# Patient Record
Sex: Male | Born: 1940 | Race: Black or African American | Hispanic: No | Marital: Married | Smoking: Never smoker
Health system: Southern US, Community
[De-identification: ages and names within clinical notes are randomized; demographics above are authoritative.]

## PROBLEM LIST (undated history)

## (undated) DIAGNOSIS — I480 Paroxysmal atrial fibrillation: Secondary | ICD-10-CM

## (undated) DIAGNOSIS — I10 Essential (primary) hypertension: Secondary | ICD-10-CM

## (undated) DIAGNOSIS — I5022 Chronic systolic (congestive) heart failure: Secondary | ICD-10-CM

## (undated) DIAGNOSIS — N183 Chronic kidney disease, stage 3 unspecified: Secondary | ICD-10-CM

## (undated) DIAGNOSIS — E785 Hyperlipidemia, unspecified: Secondary | ICD-10-CM

## (undated) DIAGNOSIS — I472 Ventricular tachycardia, unspecified: Secondary | ICD-10-CM

## (undated) DIAGNOSIS — I639 Cerebral infarction, unspecified: Secondary | ICD-10-CM

## (undated) DIAGNOSIS — N400 Enlarged prostate without lower urinary tract symptoms: Secondary | ICD-10-CM

---

## 2014-08-07 ENCOUNTER — Ambulatory Visit
Admit: 2014-08-07 | Discharge: 2014-08-07 | Payer: MEDICARE | Attending: Thoracic Surgery (Cardiothoracic Vascular Surgery)

## 2014-08-07 DIAGNOSIS — I429 Cardiomyopathy, unspecified: Secondary | ICD-10-CM

## 2014-08-07 MED ORDER — CARVEDILOL 6.25 MG TAB
6.25 mg | ORAL_TABLET | Freq: Two times a day (BID) | ORAL | Status: DC
Start: 2014-08-07 — End: 2014-09-15

## 2014-08-07 NOTE — Progress Notes (Signed)
[facility]  Office Consult      NAME:  Keith Sanford   DOB:   1940/10/27   MRN:  [id number]   PCP:  No primary care provider on file.    Date:  August 07, 2014    IMPRESSION:   1. 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   2. History of SVT, Afib; on chronic anticoagulation with Eliquis       PLAN:   1. [facility] next 4-6 weeks   2. Increase carvedilol to 12.5mg  bid  3. Continue evaluation for LVAD; we discussed risks/benefits and patient/caregiver responsibilities with [name] and his wife. They agree to proceed with workup.          Subjective:     Seen with Dr Myrtis SerKatz.     73yo M with history of chronic systolic heart failure (Class II-III) secondary to non-ischemic cardiomyopathy with most recent EF 20%.     His heart failure symptoms -progressive dyspnea on exertion- began in 2007; he was hospitalized at that time and had an AICD placed.     [facility] and treated by Dr Herold HarmsIskandar for heart failure exacerbation/volume overload. This was his first hospitalization in over 7 years for heart failure.     Cardiac ROS:   Patient denies any current exertional chest pain, dyspnea at rest, palpitations, syncope, orthopnea   +dyspnea when walking up slight incline/stairs  +PSND "a few times per week"  +BLE edema    General Review of Systems: No nausea, indigestion, vomiting, pain, cough, sputum. No bleeding. No neurological symptoms. Appetite normal. All other systems are negative except as noted in the HPI.    Objective:     Visit Vitals   Item Reading   ??? BP 118/78 mmHg   ??? Pulse 81   ??? Resp 16   ??? Ht 5\' 7"  (1.702 m)   ??? Wt 130 lb (58.968 kg)   ??? BMI 20.36 kg/m2   ??? SpO2 99%      Six Minute Walk: 2554m    General:  alert, cooperative, no distress    Neck:  supple, no significant adenopathy, carotids upstroke normal  bilaterally, no bruits    Heart:  RRR, no M/R/G    CVP:  <10 cm  ( -) HJR    Lungs: clear to auscultation bilaterally    Abdomen: soft, non-tender. Bowel sounds normal. No masses, no organomegaly.    Extremity: Trace to mild edema BLE to mid-shin    Neuro: Grossly normal        Past History:     Past Medical History   Diagnosis Date   ??? BPH (benign prostatic hyperplasia)    ??? TIA (transient ischemic attack)    ??? Cardiomyopathy (HCC)      NICM   ??? Hyperlipidemia    ??? Atrial fibrillation (HCC)    ??? SVT (supraventricular tachycardia) (HCC)    ??? Bladder neck obstruction        Past Surgical History   Procedure Laterality Date   ??? Hx implantable cardioverter defibrillator         Allergies:   No Known Allergies       Medication(s):     Current Outpatient Prescriptions   Medication Sig   ??? amiodarone (CORDARONE) 200 mg tablet Take 200 mg by mouth daily.   ??? apixaban (ELIQUIS)  5 mg tablet Take 5 mg by mouth two (2) times a day.   ??? atorvastatin (LIPITOR) 40 mg tablet Take  by mouth daily.   ??? carvedilol (COREG) 6.25 mg tablet Take  by mouth two (2) times daily (with meals).   ??? digoxin (LANOXIN) 0.125 mg tablet Take  by mouth daily.   ??? dutasteride (AVODART) 0.5 mg capsule Take 0.5 mg by mouth daily.   ??? lisinopril-hydrochlorothiazide (PRINZIDE, ZESTORETIC) 20-12.5 mg per tablet Take  by mouth daily.   ??? tamsulosin (FLOMAX) 0.4 mg capsule Take 0.4 mg by mouth daily.   ??? spironolactone (ALDACTONE) 25 mg tablet Take  by mouth daily.     No current facility-administered medications for this visit.     Additional comments: None      Data Review:     Medications reviewed  Current Outpatient Prescriptions   Medication Sig   ??? amiodarone (CORDARONE) 200 mg tablet Take 200 mg by mouth daily.   ??? apixaban (ELIQUIS) 5 mg tablet Take 5 mg by mouth two (2) times a day.   ??? atorvastatin (LIPITOR) 40 mg tablet Take  by mouth daily.   ??? carvedilol (COREG) 6.25 mg tablet Take  by mouth two (2) times daily (with meals).    ??? digoxin (LANOXIN) 0.125 mg tablet Take  by mouth daily.   ??? dutasteride (AVODART) 0.5 mg capsule Take 0.5 mg by mouth daily.   ??? lisinopril-hydrochlorothiazide (PRINZIDE, ZESTORETIC) 20-12.5 mg per tablet Take  by mouth daily.   ??? tamsulosin (FLOMAX) 0.4 mg capsule Take 0.4 mg by mouth daily.   ??? spironolactone (ALDACTONE) 25 mg tablet Take  by mouth daily.     No current facility-administered medications for this visit.       Thank you for letting us see him with you,    Misty StanleyLisa L. Eliberto IvoryAustin, ACNP   Lead VAD Coordinator   Office: [phone]  24/7 VAD pager: [phone]

## 2014-08-07 NOTE — Progress Notes (Signed)
Pt seen and examined, see not by Keith CedarLisa Sanford  Symptomatic idiopathic cardiomyopathy, with recent admission for decompensated heart failure  EF 20%, no CAD  Generally improved following recent admit but still has some PND  Had preliminary discussion re potential need for LVAD  Needs Cardiopulmonary stress test  Will increase carvedilol

## 2014-08-07 NOTE — Patient Instructions (Signed)
Please increase coreg (carvedilol to 12.5mg  twice per day). Notify us immediately if you have any dizziness or become light-headed.     We will contact you to schedule a cardiopulmonary stress test.     Please continue to follow up with Dr Herold HarmsIskandar.

## 2014-08-12 NOTE — Communication Body (Signed)
[facility]  Office Consult      NAME:  Keith Sanford   DOB:   June 17, 1941   MRN:  [id number]   PCP:  No primary care provider on file.    Date:  August 07, 2014    IMPRESSION:   1. 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   2. History of SVT, Afib; on chronic anticoagulation with Eliquis       PLAN:   1. [facility] next 4-6 weeks   2. Increase carvedilol to 12.5mg  bid  3. Continue evaluation for LVAD; we discussed risks/benefits and patient/caregiver responsibilities with Keith Sanford and his wife. They agree to proceed with workup.          Subjective:     Seen with Dr Myrtis SerKatz.     73yo M with history of chronic systolic heart failure (Class II-III) secondary to non-ischemic cardiomyopathy with most recent EF 20%.     His heart failure symptoms -progressive dyspnea on exertion- began in 2007; he was hospitalized at that time and had an AICD placed.     [facility] and treated by Dr Herold HarmsIskandar for heart failure exacerbation/volume overload. This was his first hospitalization in over 7 years for heart failure.     Cardiac ROS:   Patient denies any current exertional chest pain, dyspnea at rest, palpitations, syncope, orthopnea   +dyspnea when walking up slight incline/stairs  +PSND "a few times per week"  +BLE edema    General Review of Systems: No nausea, indigestion, vomiting, pain, cough, sputum. No bleeding. No neurological symptoms. Appetite normal. All other systems are negative except as noted in the HPI.    Objective:     Visit Vitals   Item Reading   ??? BP 118/78 mmHg   ??? Pulse 81   ??? Resp 16   ??? Ht 5\' 7"  (1.702 m)   ??? Wt 130 lb (58.968 kg)   ??? BMI 20.36 kg/m2   ??? SpO2 99%      Six Minute Walk: 24667m    General:  alert, cooperative, no distress    Neck:  supple, no significant adenopathy, carotids upstroke normal  bilaterally, no bruits    Heart:  RRR, no M/R/G    CVP:  <10 cm  ( -) HJR    Lungs: clear to auscultation bilaterally    Abdomen: soft, non-tender. Bowel sounds normal. No masses, no organomegaly.    Extremity: Trace to mild edema BLE to mid-shin    Neuro: Grossly normal        Past History:     Past Medical History   Diagnosis Date   ??? BPH (benign prostatic hyperplasia)    ??? TIA (transient ischemic attack)    ??? Cardiomyopathy (HCC)      NICM   ??? Hyperlipidemia    ??? Atrial fibrillation (HCC)    ??? SVT (supraventricular tachycardia) (HCC)    ??? Bladder neck obstruction        Past Surgical History   Procedure Laterality Date   ??? Hx implantable cardioverter defibrillator         Allergies:   No Known Allergies       Medication(s):     Current Outpatient Prescriptions   Medication Sig   ??? amiodarone (CORDARONE) 200 mg tablet Take 200 mg by mouth daily.   ??? apixaban (ELIQUIS)  5 mg tablet Take 5 mg by mouth two (2) times a day.   ??? atorvastatin (LIPITOR) 40 mg tablet Take  by mouth daily.   ??? carvedilol (COREG) 6.25 mg tablet Take  by mouth two (2) times daily (with meals).   ??? digoxin (LANOXIN) 0.125 mg tablet Take  by mouth daily.   ??? dutasteride (AVODART) 0.5 mg capsule Take 0.5 mg by mouth daily.   ??? lisinopril-hydrochlorothiazide (PRINZIDE, ZESTORETIC) 20-12.5 mg per tablet Take  by mouth daily.   ??? tamsulosin (FLOMAX) 0.4 mg capsule Take 0.4 mg by mouth daily.   ??? spironolactone (ALDACTONE) 25 mg tablet Take  by mouth daily.     No current facility-administered medications for this visit.     Additional comments: None      Data Review:     Medications reviewed  Current Outpatient Prescriptions   Medication Sig   ??? amiodarone (CORDARONE) 200 mg tablet Take 200 mg by mouth daily.   ??? apixaban (ELIQUIS) 5 mg tablet Take 5 mg by mouth two (2) times a day.   ??? atorvastatin (LIPITOR) 40 mg tablet Take  by mouth daily.   ??? carvedilol (COREG) 6.25 mg tablet Take  by mouth two (2) times daily (with meals).    ??? digoxin (LANOXIN) 0.125 mg tablet Take  by mouth daily.   ??? dutasteride (AVODART) 0.5 mg capsule Take 0.5 mg by mouth daily.   ??? lisinopril-hydrochlorothiazide (PRINZIDE, ZESTORETIC) 20-12.5 mg per tablet Take  by mouth daily.   ??? tamsulosin (FLOMAX) 0.4 mg capsule Take 0.4 mg by mouth daily.   ??? spironolactone (ALDACTONE) 25 mg tablet Take  by mouth daily.     No current facility-administered medications for this visit.       Thank you for letting us see him with you,    Misty StanleyLisa L. Eliberto IvoryAustin, ACNP   Lead VAD Coordinator   Office: [phone]  24/7 VAD pager: [phone]          Pt seen and examined, see not by Briant CedarLisa Melvenia Favela  Symptomatic idiopathic cardiomyopathy, with recent admission for decompensated heart failure  EF 20%, no CAD  Generally improved following recent admit but still has some PND  Had preliminary discussion re potential need for LVAD  Needs Cardiopulmonary stress test  Will increase carvedilol

## 2014-09-15 MED ORDER — CARVEDILOL 12.5 MG TAB
12.5 mg | ORAL_TABLET | Freq: Two times a day (BID) | ORAL | Status: AC
Start: 2014-09-15 — End: ?

## 2015-09-16 ENCOUNTER — Inpatient Hospital Stay (HOSPITAL_COMMUNITY)
Admission: AD | Admit: 2015-09-16 | Discharge: 2015-10-28 | DRG: 001 | Disposition: E | Payer: Medicare Other | Source: Other Acute Inpatient Hospital | Attending: Cardiothoracic Surgery | Admitting: Cardiothoracic Surgery

## 2015-09-16 ENCOUNTER — Encounter (HOSPITAL_COMMUNITY): Payer: Self-pay | Admitting: Internal Medicine

## 2015-09-16 DIAGNOSIS — D72829 Elevated white blood cell count, unspecified: Secondary | ICD-10-CM | POA: Diagnosis not present

## 2015-09-16 DIAGNOSIS — E869 Volume depletion, unspecified: Secondary | ICD-10-CM | POA: Diagnosis present

## 2015-09-16 DIAGNOSIS — Z9689 Presence of other specified functional implants: Secondary | ICD-10-CM

## 2015-09-16 DIAGNOSIS — Z515 Encounter for palliative care: Secondary | ICD-10-CM | POA: Diagnosis not present

## 2015-09-16 DIAGNOSIS — N2581 Secondary hyperparathyroidism of renal origin: Secondary | ICD-10-CM | POA: Diagnosis present

## 2015-09-16 DIAGNOSIS — Z8673 Personal history of transient ischemic attack (TIA), and cerebral infarction without residual deficits: Secondary | ICD-10-CM

## 2015-09-16 DIAGNOSIS — E162 Hypoglycemia, unspecified: Secondary | ICD-10-CM | POA: Diagnosis not present

## 2015-09-16 DIAGNOSIS — I480 Paroxysmal atrial fibrillation: Secondary | ICD-10-CM | POA: Diagnosis present

## 2015-09-16 DIAGNOSIS — I5043 Acute on chronic combined systolic (congestive) and diastolic (congestive) heart failure: Secondary | ICD-10-CM | POA: Diagnosis present

## 2015-09-16 DIAGNOSIS — E43 Unspecified severe protein-calorie malnutrition: Secondary | ICD-10-CM | POA: Diagnosis present

## 2015-09-16 DIAGNOSIS — J156 Pneumonia due to other aerobic Gram-negative bacteria: Secondary | ICD-10-CM | POA: Diagnosis not present

## 2015-09-16 DIAGNOSIS — Z6820 Body mass index (BMI) 20.0-20.9, adult: Secondary | ICD-10-CM | POA: Diagnosis not present

## 2015-09-16 DIAGNOSIS — R338 Other retention of urine: Secondary | ICD-10-CM | POA: Diagnosis present

## 2015-09-16 DIAGNOSIS — I2721 Secondary pulmonary arterial hypertension: Secondary | ICD-10-CM | POA: Diagnosis present

## 2015-09-16 DIAGNOSIS — N359 Urethral stricture, unspecified: Secondary | ICD-10-CM | POA: Diagnosis present

## 2015-09-16 DIAGNOSIS — I309 Acute pericarditis, unspecified: Secondary | ICD-10-CM | POA: Insufficient documentation

## 2015-09-16 DIAGNOSIS — R5082 Postprocedural fever: Secondary | ICD-10-CM | POA: Diagnosis not present

## 2015-09-16 DIAGNOSIS — N320 Bladder-neck obstruction: Secondary | ICD-10-CM | POA: Diagnosis present

## 2015-09-16 DIAGNOSIS — J189 Pneumonia, unspecified organism: Secondary | ICD-10-CM | POA: Diagnosis not present

## 2015-09-16 DIAGNOSIS — Z4509 Encounter for adjustment and management of other cardiac device: Secondary | ICD-10-CM

## 2015-09-16 DIAGNOSIS — N179 Acute kidney failure, unspecified: Secondary | ICD-10-CM | POA: Diagnosis present

## 2015-09-16 DIAGNOSIS — I5021 Acute systolic (congestive) heart failure: Secondary | ICD-10-CM

## 2015-09-16 DIAGNOSIS — I425 Other restrictive cardiomyopathy: Secondary | ICD-10-CM | POA: Diagnosis not present

## 2015-09-16 DIAGNOSIS — D62 Acute posthemorrhagic anemia: Secondary | ICD-10-CM | POA: Diagnosis not present

## 2015-09-16 DIAGNOSIS — I509 Heart failure, unspecified: Secondary | ICD-10-CM | POA: Diagnosis not present

## 2015-09-16 DIAGNOSIS — Z8249 Family history of ischemic heart disease and other diseases of the circulatory system: Secondary | ICD-10-CM

## 2015-09-16 DIAGNOSIS — Z452 Encounter for adjustment and management of vascular access device: Secondary | ICD-10-CM

## 2015-09-16 DIAGNOSIS — N401 Enlarged prostate with lower urinary tract symptoms: Secondary | ICD-10-CM | POA: Diagnosis present

## 2015-09-16 DIAGNOSIS — R0600 Dyspnea, unspecified: Secondary | ICD-10-CM | POA: Diagnosis not present

## 2015-09-16 DIAGNOSIS — Z95810 Presence of automatic (implantable) cardiac defibrillator: Secondary | ICD-10-CM | POA: Diagnosis present

## 2015-09-16 DIAGNOSIS — Z9911 Dependence on respirator [ventilator] status: Secondary | ICD-10-CM | POA: Diagnosis not present

## 2015-09-16 DIAGNOSIS — Z7901 Long term (current) use of anticoagulants: Secondary | ICD-10-CM

## 2015-09-16 DIAGNOSIS — N289 Disorder of kidney and ureter, unspecified: Secondary | ICD-10-CM | POA: Diagnosis not present

## 2015-09-16 DIAGNOSIS — I313 Pericardial effusion (noninflammatory): Secondary | ICD-10-CM | POA: Diagnosis not present

## 2015-09-16 DIAGNOSIS — A419 Sepsis, unspecified organism: Secondary | ICD-10-CM | POA: Diagnosis not present

## 2015-09-16 DIAGNOSIS — I5023 Acute on chronic systolic (congestive) heart failure: Secondary | ICD-10-CM | POA: Diagnosis present

## 2015-09-16 DIAGNOSIS — N390 Urinary tract infection, site not specified: Secondary | ICD-10-CM | POA: Diagnosis present

## 2015-09-16 DIAGNOSIS — J9811 Atelectasis: Secondary | ICD-10-CM | POA: Diagnosis not present

## 2015-09-16 DIAGNOSIS — I1310 Hypertensive heart and chronic kidney disease without heart failure, with stage 1 through stage 4 chronic kidney disease, or unspecified chronic kidney disease: Secondary | ICD-10-CM | POA: Diagnosis present

## 2015-09-16 DIAGNOSIS — N184 Chronic kidney disease, stage 4 (severe): Secondary | ICD-10-CM | POA: Diagnosis present

## 2015-09-16 DIAGNOSIS — B377 Candidal sepsis: Secondary | ICD-10-CM | POA: Diagnosis not present

## 2015-09-16 DIAGNOSIS — Y95 Nosocomial condition: Secondary | ICD-10-CM | POA: Diagnosis not present

## 2015-09-16 DIAGNOSIS — N183 Chronic kidney disease, stage 3 unspecified: Secondary | ICD-10-CM | POA: Diagnosis present

## 2015-09-16 DIAGNOSIS — E785 Hyperlipidemia, unspecified: Secondary | ICD-10-CM | POA: Diagnosis present

## 2015-09-16 DIAGNOSIS — E874 Mixed disorder of acid-base balance: Secondary | ICD-10-CM | POA: Diagnosis not present

## 2015-09-16 DIAGNOSIS — R570 Cardiogenic shock: Secondary | ICD-10-CM | POA: Diagnosis present

## 2015-09-16 DIAGNOSIS — E870 Hyperosmolality and hypernatremia: Secondary | ICD-10-CM | POA: Diagnosis present

## 2015-09-16 DIAGNOSIS — J9600 Acute respiratory failure, unspecified whether with hypoxia or hypercapnia: Secondary | ICD-10-CM | POA: Diagnosis not present

## 2015-09-16 DIAGNOSIS — J9690 Respiratory failure, unspecified, unspecified whether with hypoxia or hypercapnia: Secondary | ICD-10-CM

## 2015-09-16 DIAGNOSIS — I4891 Unspecified atrial fibrillation: Secondary | ICD-10-CM | POA: Diagnosis not present

## 2015-09-16 DIAGNOSIS — J158 Pneumonia due to other specified bacteria: Secondary | ICD-10-CM | POA: Diagnosis not present

## 2015-09-16 DIAGNOSIS — R0603 Acute respiratory distress: Secondary | ICD-10-CM

## 2015-09-16 DIAGNOSIS — J984 Other disorders of lung: Secondary | ICD-10-CM | POA: Diagnosis not present

## 2015-09-16 DIAGNOSIS — I493 Ventricular premature depolarization: Secondary | ICD-10-CM | POA: Diagnosis not present

## 2015-09-16 DIAGNOSIS — B49 Unspecified mycosis: Secondary | ICD-10-CM | POA: Diagnosis not present

## 2015-09-16 DIAGNOSIS — K567 Ileus, unspecified: Secondary | ICD-10-CM

## 2015-09-16 DIAGNOSIS — I428 Other cardiomyopathies: Secondary | ICD-10-CM | POA: Diagnosis present

## 2015-09-16 DIAGNOSIS — Z95811 Presence of heart assist device: Secondary | ICD-10-CM | POA: Diagnosis not present

## 2015-09-16 DIAGNOSIS — N189 Chronic kidney disease, unspecified: Secondary | ICD-10-CM

## 2015-09-16 DIAGNOSIS — T17990A Other foreign object in respiratory tract, part unspecified in causing asphyxiation, initial encounter: Secondary | ICD-10-CM | POA: Diagnosis not present

## 2015-09-16 DIAGNOSIS — J151 Pneumonia due to Pseudomonas: Secondary | ICD-10-CM | POA: Diagnosis not present

## 2015-09-16 DIAGNOSIS — I472 Ventricular tachycardia: Secondary | ICD-10-CM | POA: Diagnosis present

## 2015-09-16 DIAGNOSIS — R0602 Shortness of breath: Secondary | ICD-10-CM

## 2015-09-16 DIAGNOSIS — Z960 Presence of urogenital implants: Secondary | ICD-10-CM | POA: Diagnosis not present

## 2015-09-16 DIAGNOSIS — Z4659 Encounter for fitting and adjustment of other gastrointestinal appliance and device: Secondary | ICD-10-CM

## 2015-09-16 DIAGNOSIS — R6521 Severe sepsis with septic shock: Secondary | ICD-10-CM | POA: Diagnosis not present

## 2015-09-16 DIAGNOSIS — J90 Pleural effusion, not elsewhere classified: Secondary | ICD-10-CM | POA: Diagnosis not present

## 2015-09-16 DIAGNOSIS — Z66 Do not resuscitate: Secondary | ICD-10-CM | POA: Diagnosis not present

## 2015-09-16 DIAGNOSIS — I469 Cardiac arrest, cause unspecified: Secondary | ICD-10-CM | POA: Diagnosis not present

## 2015-09-16 DIAGNOSIS — R5381 Other malaise: Secondary | ICD-10-CM | POA: Diagnosis not present

## 2015-09-16 DIAGNOSIS — Z823 Family history of stroke: Secondary | ICD-10-CM

## 2015-09-16 DIAGNOSIS — N170 Acute kidney failure with tubular necrosis: Secondary | ICD-10-CM | POA: Diagnosis not present

## 2015-09-16 DIAGNOSIS — I959 Hypotension, unspecified: Secondary | ICD-10-CM | POA: Diagnosis present

## 2015-09-16 DIAGNOSIS — Z01810 Encounter for preprocedural cardiovascular examination: Secondary | ICD-10-CM | POA: Diagnosis not present

## 2015-09-16 DIAGNOSIS — N178 Other acute kidney failure: Secondary | ICD-10-CM | POA: Diagnosis not present

## 2015-09-16 DIAGNOSIS — I272 Other secondary pulmonary hypertension: Secondary | ICD-10-CM | POA: Diagnosis present

## 2015-09-16 DIAGNOSIS — R319 Hematuria, unspecified: Secondary | ICD-10-CM | POA: Diagnosis present

## 2015-09-16 DIAGNOSIS — R509 Fever, unspecified: Secondary | ICD-10-CM | POA: Diagnosis not present

## 2015-09-16 DIAGNOSIS — I9589 Other hypotension: Secondary | ICD-10-CM | POA: Diagnosis not present

## 2015-09-16 DIAGNOSIS — L8990 Pressure ulcer of unspecified site, unspecified stage: Secondary | ICD-10-CM | POA: Insufficient documentation

## 2015-09-16 HISTORY — DX: Paroxysmal atrial fibrillation: I48.0

## 2015-09-16 HISTORY — DX: Benign prostatic hyperplasia without lower urinary tract symptoms: N40.0

## 2015-09-16 HISTORY — DX: Essential (primary) hypertension: I10

## 2015-09-16 HISTORY — DX: Hyperlipidemia, unspecified: E78.5

## 2015-09-16 HISTORY — DX: Cerebral infarction, unspecified: I63.9

## 2015-09-16 HISTORY — DX: Ventricular tachycardia, unspecified: I47.20

## 2015-09-16 HISTORY — DX: Chronic systolic (congestive) heart failure: I50.22

## 2015-09-16 HISTORY — DX: Chronic kidney disease, stage 3 (moderate): N18.3

## 2015-09-16 HISTORY — DX: Chronic kidney disease, stage 3 unspecified: N18.30

## 2015-09-16 HISTORY — DX: Ventricular tachycardia: I47.2

## 2015-09-16 LAB — CARBOXYHEMOGLOBIN
Carboxyhemoglobin: 1.1 % (ref 0.5–1.5)
Carboxyhemoglobin: 1.2 % (ref 0.5–1.5)
METHEMOGLOBIN: 0.9 % (ref 0.0–1.5)
METHEMOGLOBIN: 1 % (ref 0.0–1.5)
O2 Saturation: 36.3 %
O2 Saturation: 53 %
Total hemoglobin: 14.8 g/dL (ref 13.5–18.0)
Total hemoglobin: 15.2 g/dL (ref 13.5–18.0)

## 2015-09-16 LAB — COMPREHENSIVE METABOLIC PANEL
ALT: 31 U/L (ref 17–63)
AST: 36 U/L (ref 15–41)
Albumin: 2.5 g/dL — ABNORMAL LOW (ref 3.5–5.0)
Alkaline Phosphatase: 117 U/L (ref 38–126)
Anion gap: 11 (ref 5–15)
BUN: 31 mg/dL — AB (ref 6–20)
CHLORIDE: 114 mmol/L — AB (ref 101–111)
CO2: 24 mmol/L (ref 22–32)
CREATININE: 2.04 mg/dL — AB (ref 0.61–1.24)
Calcium: 8.6 mg/dL — ABNORMAL LOW (ref 8.9–10.3)
GFR calc Af Amer: 35 mL/min — ABNORMAL LOW (ref >60)
GFR, EST NON AFRICAN AMERICAN: 30 mL/min — AB (ref >60)
Glucose, Bld: 181 mg/dL — ABNORMAL HIGH (ref 65–99)
Potassium: 4.3 mmol/L (ref 3.5–5.1)
Sodium: 149 mmol/L — ABNORMAL HIGH (ref 135–145)
Total Bilirubin: 1.5 mg/dL — ABNORMAL HIGH (ref 0.3–1.2)
Total Protein: 5.7 g/dL — ABNORMAL LOW (ref 6.5–8.1)

## 2015-09-16 LAB — CBC
HEMATOCRIT: 43.4 % (ref 39.0–52.0)
Hemoglobin: 14.6 g/dL (ref 13.0–17.0)
MCH: 30.5 pg (ref 26.0–34.0)
MCHC: 33.6 g/dL (ref 30.0–36.0)
MCV: 90.8 fL (ref 78.0–100.0)
PLATELETS: 502 10*3/uL — AB (ref 150–400)
RBC: 4.78 MIL/uL (ref 4.22–5.81)
RDW: 14.3 % (ref 11.5–15.5)
WBC: 8 10*3/uL (ref 4.0–10.5)

## 2015-09-16 LAB — MRSA PCR SCREENING: MRSA BY PCR: NEGATIVE

## 2015-09-16 LAB — BRAIN NATRIURETIC PEPTIDE: B Natriuretic Peptide: >4500 pg/mL — ABNORMAL HIGH (ref 0.0–100.0)

## 2015-09-16 LAB — URIC ACID: URIC ACID, SERUM: 8.4 mg/dL — AB (ref 4.4–7.6)

## 2015-09-16 LAB — ABO/RH: ABO/RH(D): B POS

## 2015-09-16 MED ORDER — SILDENAFIL CITRATE 20 MG PO TABS
20.0000 mg | ORAL_TABLET | Freq: Three times a day (TID) | ORAL | Status: DC
  Administered 2015-09-16 – 2015-09-17 (×2): 20 mg via ORAL
  Filled 2015-09-16 (×2): qty 1

## 2015-09-16 MED ORDER — SODIUM CHLORIDE 0.9 % IV SOLN
250.0000 mL | INTRAVENOUS | Status: DC | PRN
  Administered 2015-09-16: 4 mL via INTRAVENOUS
  Administered 2015-09-23 (×2): via INTRAVENOUS

## 2015-09-16 MED ORDER — SODIUM CHLORIDE 0.9 % IJ SOLN
10.0000 mL | Freq: Two times a day (BID) | INTRAMUSCULAR | Status: DC
  Administered 2015-09-17 – 2015-09-19 (×6): 10 mL
  Administered 2015-09-20: 30 mL
  Administered 2015-09-20 – 2015-09-22 (×4): 10 mL

## 2015-09-16 MED ORDER — ONDANSETRON HCL 4 MG/2ML IJ SOLN: 4.0000 mg | Freq: Four times a day (QID) | INTRAMUSCULAR | Status: DC | PRN

## 2015-09-16 MED ORDER — ATORVASTATIN CALCIUM 40 MG PO TABS
40.0000 mg | ORAL_TABLET | Freq: Every day | ORAL | Status: DC
  Administered 2015-09-16 – 2015-09-22 (×7): 40 mg via ORAL
  Filled 2015-09-16 (×7): qty 1

## 2015-09-16 MED ORDER — SODIUM CHLORIDE 0.9 % IJ SOLN: 10.0000 mL | INTRAMUSCULAR | Status: DC | PRN

## 2015-09-16 MED ORDER — SODIUM CHLORIDE 0.9 % IJ SOLN: 3.0000 mL | INTRAMUSCULAR | Status: DC | PRN

## 2015-09-16 MED ORDER — ACETAMINOPHEN 325 MG PO TABS: 650.0000 mg | ORAL_TABLET | ORAL | Status: DC | PRN

## 2015-09-16 MED ORDER — HEPARIN (PORCINE) IN NACL 100-0.45 UNIT/ML-% IJ SOLN
800.0000 [IU]/h | INTRAMUSCULAR | Status: DC
  Administered 2015-09-16: 800 [IU]/h via INTRAVENOUS
  Filled 2015-09-16: qty 250

## 2015-09-16 MED ORDER — SODIUM CHLORIDE 0.9 % IJ SOLN
3.0000 mL | Freq: Two times a day (BID) | INTRAMUSCULAR | Status: DC
  Administered 2015-09-16 – 2015-09-21 (×7): 3 mL via INTRAVENOUS

## 2015-09-16 MED ORDER — MILRINONE IN DEXTROSE 20 MG/100ML IV SOLN
0.2500 ug/kg/min | INTRAVENOUS | Status: DC
  Administered 2015-09-16: 0.375 ug/kg/min via INTRAVENOUS
  Administered 2015-09-17: 0.25 ug/kg/min via INTRAVENOUS
  Administered 2015-09-17: 0.375 ug/kg/min via INTRAVENOUS
  Administered 2015-09-19 – 2015-09-23 (×4): 0.25 ug/kg/min via INTRAVENOUS
  Filled 2015-09-16 (×10): qty 100

## 2015-09-16 MED ORDER — AMIODARONE HCL 200 MG PO TABS
200.0000 mg | ORAL_TABLET | Freq: Every day | ORAL | Status: DC
  Administered 2015-09-16 – 2015-09-22 (×7): 200 mg via ORAL
  Filled 2015-09-16 (×6): qty 1

## 2015-09-16 MED ORDER — LIDOCAINE VISCOUS 2 % MT SOLN
15.0000 mL | Freq: Once | OROMUCOSAL | Status: DC
  Filled 2015-09-16: qty 15

## 2015-09-16 MED ORDER — DUTASTERIDE 0.5 MG PO CAPS
0.5000 mg | ORAL_CAPSULE | Freq: Every day | ORAL | Status: DC
  Administered 2015-09-16: 0.5 mg via ORAL
  Filled 2015-09-16 (×2): qty 1

## 2015-09-16 MED ORDER — FUROSEMIDE 10 MG/ML IJ SOLN
40.0000 mg | Freq: Two times a day (BID) | INTRAMUSCULAR | Status: DC
  Administered 2015-09-16: 40 mg via INTRAVENOUS
  Filled 2015-09-16 (×2): qty 4

## 2015-09-16 NOTE — Progress Notes (Addendum)
LVAD Coordinator Note:  Initial Encounter with LVAD Team and MCS Introduction:  [name] 74 y/o male with h/o PAF, previous CVA, HTN, HL, V. 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 Jude CTR-D (initial ICD 2007 with upgrade to CRT in 2015) referred by Dr. Bernarda Caffey for further management of cardiogenic shock and consideration of advanced therapies; was transferred here from [facility] with worsening heart failure symptoms.   Clinical course with heart failure: Has had worsening HF symptoms and are currently Class IV with pronounced fatigue and activity intolerance. Currently on a Milrinone drip (started 09/24/2015).  VAD educational packet including "HM II Patient Handbook", "HM II Left Ventricular Assist System" packet, and "[place] HM II Patient Education" reviewed in detail with me and left at bedside for continued reference. Patient education DVD was given to him as well for reference should he want to see more about the equipment at home after reviewing the information I left her.   Explained that LVAD can be implanted for two indications in the setting of advanced left ventricular heart failure treatment:  1. Bridge to transplant - used for patients who cannot safely wait for heart transplant without this device.  Or   2. Destination therapy - used for patients until end of life or recovery of heart function.  Discussed that at this point the patient would be considered for Destination therapy should the patient be deemed an acceptable VAD candidate.   Provided brief equipment overview of the HeartMate II pump and discussed placement, surgical procedure, peripheral equipment, life-long coumadin therapy, importance of medication adherence and clinic follow up for as long as patient is living on support, life-style modifications, as well as need for caregiver to be successful with this therapy.   The patient was familiar to the equipment and concept of  the LVAD. We discussed the process of the evaluation period and how a decision was made by the [facility] team whether she would be an appropriate candidate for therapy or not. Evaluation consent was reviewed and given for reference while the patient makes his/her decision to proceed with candidacy evaluation.   Caregiver Support: Has a wife of >50 years that will be his caregiver  Home Inspection Checklist: not reviewed currently   Advised the patient review the materials, contact either myself or Hessie Diener with questions and we will plan on meeting with her at next scheduled clinic appointment. Verbalized he/she would review the evaluation consent and make a decision regarding the evaluation process.   At this point after briefly reviewing her chart some points that would exclude the patient or make them extremely high risk to have LVAD surgery include:   Unable to determine at this time; however concerned regarding distance (3 hour drive); also concerned about why he was unable to maintain his CPX appointments in the past with our team as well as FU OV with the [facility] VAD team   **I spoke with Denny Peon (VAD Coordinator) at [facility] [phone] and she told me that their team has not seen the patient yet for evaluation due to schedule issues. Dr. Gala Romney stated he did see the surgeon Myrtis Ser) up in the area. Will try to get the notes from that visit.    VAD Coordinators will FU with them tomorrow AM when his wife returns. He did not want to agree to evaluation process without speaking to her first.   Session Time: 15 min  Rexene Alberts, RN  VAD Coordinator   Office: [phone] 24/7 VAD Pager: [phone]

## 2015-09-16 NOTE — H&P (Signed)
Advanced Heart Failure Team History and Physical Note  Primary Cardiologist:  Harriette Bouillon, MD   Reason for Admission: Cardiogenic shock   HPI:    74 y/o male with h/o PAF, previous CVA, HTN, HL, V. 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 Jude CTR-D (initial ICD 2007 with upgrade to CRT in 2015) referred by Dr. Bernarda Caffey for further management of cardiogenic shock and consideration of advanced therapies.   Developed HF in 2007 cath showed normal coronaries by report. Doing well until about a year ago and was referred to [facility] for possible advanced therapies. Repeat LHC at that time showed ok coronaries. Felt to be too early for advanced therapies but CPX ordered. Unfortunately CPX never performed due to scheduling difficulties.  Over past 2 months has developed Class IV HF symptoms. With severe fatigue. Unable to do any activities. Admitted in [place]. Echo reviewed personally LVEF 5% with global HK. RV mild to moderate dysfunction. Underwent RHC today   RA 11 RV 77/9 PA 74/25 (43) PCWP 23 Fick CO/CI 1.68/1.05 PA sat 43%  Started on dobutamine and transferred here. Feels weak. No orthopnea or PND. No CP. No recent ICD shocks. 1 year ago was completely functional.   Review of Systems: [y] = yes, [ ]  = no   General: Weight gain [ ] ; Weight loss [ ] ; Anorexia [ ] ; Fatigue Cove.Etienne ]; Fever [ ] ; Chills [ ] ; Weakness [ ]   Cardiac: Chest pain/pressure [ ] ; Resting SOB [ ] ; Exertional SOB [ y]; Orthopnea [ ] ; Pedal Edema [ ] ; Palpitations [ ] ; Syncope [ ] ; Presyncope [ ] ; Paroxysmal nocturnal dyspnea[ ]   Pulmonary: Cough [ ] ; Wheezing[ ] ; Hemoptysis[ ] ; Sputum [ ] ; Snoring [ ]   GI: Vomiting[ ] ; Dysphagia[ ] ; Melena[ ] ; Hematochezia [ ] ; Heartburn[ ] ; Abdominal pain [ ] ; Constipation [ ] ; Diarrhea [ ] ; BRBPR [ ]   GU: Hematuria[ ] ; Dysuria [ ] ; Nocturia[ ]   Vascular: Pain in legs with walking [ ] ; Pain in feet with lying flat [ ] ; Non-healing sores [ ] ; Stroke [  ]; TIA [ ] ; Slurred speech [ ] ;  Neuro: Headaches[ ] ; Vertigo[ ] ; Seizures[ ] ; Paresthesias[ ] ;Blurred vision [ ] ; Diplopia [ ] ; Vision changes [ ]   Ortho/Skin: Arthritis Cove.Etienne ]; Joint pain [ y]; Muscle pain [ ] ; Joint swelling [ ] ; Back Pain [ ] ; Rash [ ]   Psych: Depression[ ] ; Anxiety[ ]   Heme: Bleeding problems [ ] ; Clotting disorders [ ] ; Anemia [ ]   Endocrine: Diabetes [ ] ; Thyroid dysfunction[ ]   Home Medications 1. Amiodarone 100 daily 2. Atorvastatin 40mg  daily 3. Carvedilol 12.5 bid 4. Digoxin 0.125mg  daily 5. Eliquis 2.5 bid 6. Flomax 0.4 daily 7. Bumex 1mg  prn  Past Medical History: Past Medical History  Diagnosis Date  . 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 CKD (chronic kidney disease) stage 3, GFR 30-59 ml/min   . V-tach (HCC)   . PAF (paroxysmal atrial fibrillation) (HCC)   . CVA (cerebral infarction)   . BPH (benign prostatic hyperplasia)   . HTN (hypertension), benign   . Hyperlipidemia     Past Surgical History: No past surgical history on file.  Family History: Family History  Problem Relation Age of Onset  . Hypertension Mother   . CVA Maternal Aunt     Social History: Social History   Social History  . Marital Status: Married  Spouse Name: N/A  . Number of Children: N/A  . Years of Education: N/A   Occupational History  . [employer]     part time   Social History Main Topics  . Smoking status: Never Smoker   . Smokeless tobacco: Not on file  . Alcohol Use: No  . Drug Use: No  . Sexual Activity: Not on file   Other Topics Concern  . Not on file   Social History Narrative  . No narrative on file    Allergies:  Allergies no known allergies  Objective:    Vital Signs:   Weight:  [53.4 kg (117 lb 11.6 oz)] 53.4 kg (117 lb 11.6 oz) (12/21 1553)   Filed Weights   08/30/2015 1553  Weight: 53.4 kg (117 lb 11.6 oz)    Physical Exam: General:  Frail. Thin. Lying in bed.  No resp  difficulty HEENT: normal Neck: supple. JVP 9 . Carotids 2+ bilat; no bruits. No lymphadenopathy or thryomegaly appreciated. Cor: PMI laterally displaced. Regular rate & rhythm. +s3 Lungs: clear Abdomen: soft, nontender, nondistended. No hepatosplenomegaly. No bruits or masses. Good bowel sounds. Extremities: no cyanosis, clubbing, rash, edema Neuro: alert & orientedx3, cranial nerves grossly intact. moves all 4 extremities w/o difficulty. Affect pleasant  Telemetry: AV paced 80-90s  Labs: Basic Metabolic Panel: No results for input(s): NA, K, CL, CO2, GLUCOSE, BUN, CREATININE, CALCIUM, MG, PHOS in the last 168 hours.  Liver Function Tests: No results for input(s): AST, ALT, ALKPHOS, BILITOT, PROT, ALBUMIN in the last 168 hours. No results for input(s): LIPASE, AMYLASE in the last 168 hours. No results for input(s): AMMONIA in the last 168 hours.  CBC: No results for input(s): WBC, NEUTROABS, HGB, HCT, MCV, PLT in the last 168 hours.  Cardiac Enzymes: No results for input(s): CKTOTAL, CKMB, CKMBINDEX, TROPONINI in the last 168 hours.  BNP: BNP (last 3 results) No results for input(s): BNP in the last 8760 hours.  ProBNP (last 3 results) No results for input(s): PROBNP in the last 8760 hours.   CBG: No results for input(s): GLUCAP in the last 168 hours.  Coagulation Studies: No results for input(s): LABPROT, INR in the last 72 hours.  Other results: EKG: AV paced (LV pacing)  Imaging: No results found.      Assessment:   1. Cardiogenic shock 2. 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 RV mild to moderate HK    --s/p St. Jude CRT-D 3. PAF on Eiiquis     --This patients CHA2DS2-VASc Score and unadjusted Ischemic Stroke Rate (% per year) is equal to 7.2 % stroke rate/year from a score of 5 Above score calculated as 1 point each if present [CHF, HTN, DM, Vascular=MI/PAD/Aortic Plaque, Age if 65-74, or Male] Above score calculated as 2 points each if  present [Age > 75, or Stroke/TIA/TE] 4. 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 (previous baseline ~1.5) 6. HTN 7. Hyperlipidemia 8. Pulmonary HTN    Plan/Discussion:     He has end-stage HF now with cardiogenic shock. LVEF 5%. RV with mild to moderate dysfunction. 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 Will need inotrope support for now. Place PICC. Given severe PAH will change dobutamine to milrinone and add sildenafil. Diurese gently. Stop carvedilol. No ACE/ARB due to renal failure.  I suspect renal  function will improve with inotropes. Will begin VAD w/u. RV looks suitable.   Follow co-ox and CVP. Will need repeat RHC to reassess pulmonary pressures in 48 hours.   Given size may be Heartware candidate and not Heartmate II.   ICD interrogated personally. AV delay extended from 40ms to . Hopefully this will allow for more LV filling time. No VT or AF.   Hold Eliquis. Start heparin.   Length of Stay: 0   Arvilla Meres MD 09/01/2015, 4:50 PM  Advanced Heart Failure Team Pager [phone] (M-F; 7a - 4p)  Please contact [facility] for night-coverage after hours (4p -7a ) and weekends on [email or web address] A

## 2015-09-16 NOTE — Procedures (Signed)
Foley Catheter Placement Note  Indications:  1. Urinary retention   Pre-operative Diagnosis:  1. Urinary retention   Post-operative Diagnosis:  1. Urinary retention  2. Prostatic urethral stricture   Surgeon: Berniece Salines, MD   Resident: E. Emily Filbert, MD  Assistants: None  Procedure Details  Patient was placed in the supine position, prepped with Betadine and draped in the usual sterile fashion.  We injected lidocaine jelly per urethra prior to the procedure.    We then proceeded with cystourethroscopy. The patient was positioned supine on the table in the relaxed dorsal lithotomy  position and the external genitalia were prepped and draped in the standard fashion.  The cystoscope was inserted per urethra with copious lubrication.  The anterior urethra was normal.  At the level of the prostatic urethra we encountered evidence of multiple dense adhesions which had formed between the lateral lobes of the prostate following his TURP. I was able to anteriorly identify a very small pin point 3 [place] opening. We passed a sensor wire through the anterior opening under direct visualization without meeting any resistance until the wire curled in the bladder. We then removed the cystoscopy and obtained a 16 french council tip catheter and advanced the foley over the wire into the bladder with some force resulting in return of clear yellow urine. We inflated the balloon with 10 mL sterile water and removed the wire. The foley was attached to a drainage bag and secured with a StatLock.  Placement of the catheter had return of greater than 500 L of clear yellow urine.  Complications: None; patient tolerated the procedure well.  Plan:   1.  Continue Foley catheter to drainage for 7 days due to urethral dilation and greater than 500 L of urine returned with catheter placement and bladder stretch injury

## 2015-09-16 NOTE — Consult Note (Signed)
Urology Consult  Referring physician: Glori Bickers, MD Reason for referral: Urinary retention  Chief Complaint: Urinary retention  History of Present Illness:  74 yo M with a complex past medical history including end stage heart failure who was admitted tonight with cardiogenic shock. He developed urinary retention and multiple unsuccessful attempts were made at placement of foley catheter, including with a 16 coude catheter.   Of note, he has a history of a TURP in ~2005. Following his procedure he reports voiding well. He denies weak stream, straining or acute urinary retention. He has had foley catheters placed since his TURP but does note that it was not straightforward but did not require cystoscopy or wires as far as he knows.  Urology was consulted for urgent foley catheter placement.  Past Medical History  Diagnosis Date  . 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 CKD (chronic kidney disease) stage 3, GFR 30-59 ml/min   . V-tach ([place])   . PAF (paroxysmal atrial fibrillation) ([place])   . CVA (cerebral infarction)   . BPH (benign prostatic hyperplasia)   . HTN (hypertension), benign   . Hyperlipidemia    No past surgical history on file.  Medications: I have reviewed the patient's current medications. Allergies: No Known Allergies  Family History  Problem Relation Age of Onset  . Hypertension Mother   . CVA Maternal Aunt    Social History:  reports that he has never smoked. He does not have any smokeless tobacco history on file. He reports that he does not drink alcohol or use illicit drugs.  ROS 12 system review was performed and was negative except for the pertinent positives listed in the HPI.  Physical Exam:  Vital signs in last 24 hours: Temp:  [97.4 F (36.3 C)] 97.4 F (36.3 C) (12/21 1800) Pulse Rate:  [58-60] 59 (12/21 1800) Resp:  [10-25] 15 (12/21 1800) BP: (132-141)/(87-94) 141/87 mmHg (12/21 1800) SpO2:  [98 %-100  %] 100 % (12/21 1800) Weight:  [53.4 kg (117 lb 11.6 oz)] 53.4 kg (117 lb 11.6 oz) (12/21 1553) Physical Exam  Constitutional: He is oriented to person, place, and time. He appears well-developed. He appears distressed.  HENT:  Head: Normocephalic and atraumatic.  Eyes: Conjunctivae are normal.  Respiratory: Effort normal.  GI:  Bladder is palpable and tender to palpation.  Genitourinary: Penis normal.  Circumcised male phallus, testicles non-tender to palpation.  Neurological: He is alert and oriented to person, place, and time.  Skin: Skin is warm and dry.  Psychiatric: He has a normal mood and affect. His behavior is normal.    Laboratory Data:  Results for orders placed or performed during the hospital encounter of 09/02/2015 (from the past 72 hour(s))  MRSA PCR Screening     Status: None   Collection Time: 09/19/2015  3:52 PM  Result Value Ref Range   MRSA by PCR NEGATIVE NEGATIVE    Comment:        The GeneXpert MRSA Assay (FDA approved for NASAL specimens only), is one component of a comprehensive MRSA colonization surveillance program. It is not intended to diagnose MRSA infection nor to guide or monitor treatment for MRSA infections.   ABO/Rh     Status: None   Collection Time: 09/13/2015  5:00 PM  Result Value Ref Range   ABO/RH(D) B POS   Carboxyhemoglobin     Status: None   Collection Time: 09/22/2015  5:48 PM  Result Value Ref Range   Total hemoglobin 14.8 13.5 - 18.0 g/dL   O2 Saturation 36.3 %   Carboxyhemoglobin 1.1 0.5 - 1.5 %   Methemoglobin 0.9 0.0 - 1.5 %  CBC     Status: Abnormal   Collection Time: 09/24/2015  6:02 PM  Result Value Ref Range   WBC 8.0 4.0 - 10.5 K/uL   RBC 4.78 4.22 - 5.81 MIL/uL   Hemoglobin 14.6 13.0 - 17.0 g/dL   HCT 43.4 39.0 - 52.0 %   MCV 90.8 78.0 - 100.0 fL   MCH 30.5 26.0 - 34.0 pg   MCHC 33.6 30.0 - 36.0 g/dL   RDW 14.3 11.5 - 15.5 %   Platelets 502 (H) 150 - 400 K/uL  Brain natriuretic peptide     Status: Abnormal    Collection Time: 09/12/2015  6:03 PM  Result Value Ref Range   B Natriuretic Peptide >4500.0 (H) 0.0 - 100.0 pg/mL  Comprehensive metabolic panel     Status: Abnormal   Collection Time: 09/13/2015  8:17 PM  Result Value Ref Range   Sodium 149 (H) 135 - 145 mmol/L   Potassium 4.3 3.5 - 5.1 mmol/L   Chloride 114 (H) 101 - 111 mmol/L   CO2 24 22 - 32 mmol/L   Glucose, Bld 181 (H) 65 - 99 mg/dL   BUN 31 (H) 6 - 20 mg/dL   Creatinine, Ser 2.04 (H) 0.61 - 1.24 mg/dL   Calcium 8.6 (L) 8.9 - 10.3 mg/dL   Total Protein 5.7 (L) 6.5 - 8.1 g/dL   Albumin 2.5 (L) 3.5 - 5.0 g/dL   AST 36 15 - 41 U/L   ALT 31 17 - 63 U/L   Alkaline Phosphatase 117 38 - 126 U/L   Total Bilirubin 1.5 (H) 0.3 - 1.2 mg/dL   GFR calc non Af Amer 30 (L) >60 mL/min   GFR calc Af Amer 35 (L) >60 mL/min    Comment: (NOTE) The eGFR has been calculated using the CKD EPI equation. This calculation has not been validated in all clinical situations. eGFR's persistently <60 mL/min signify possible Chronic Kidney Disease.    Anion gap 11 5 - 15  Uric acid     Status: Abnormal   Collection Time: 09/22/2015  8:17 PM  Result Value Ref Range   Uric Acid, Serum 8.4 (H) 4.4 - 7.6 mg/dL   Recent Results (from the past 240 hour(s))  MRSA PCR Screening     Status: None   Collection Time: 08/31/2015  3:52 PM  Result Value Ref Range Status   MRSA by PCR NEGATIVE NEGATIVE Final    Comment:        The GeneXpert MRSA Assay (FDA approved for NASAL specimens only), is one component of a comprehensive MRSA colonization surveillance program. It is not intended to diagnose MRSA infection nor to guide or monitor treatment for MRSA infections.    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 Attempt at placement of 91F coude catheter was unsuccessful with significant resistance noted at the prostatic urethra. Cystourethroscopy was  performed at the bedside and demonstrated evidence of multiple dense adhesions between the bilateral lateral lobes of the prostate causing obstruction. A wire was passed through a small opening anteriorly and a 5F council catheter was used to "dilate" the opening and advanced successfully into the bladder with return of clear, yellow urine.  Plan:  1. Continue Foley catheter to drainage for 7 days due to urethral dilation and greater than 500 L of urine returned with catheter placement and bladder stretch injury 2. He may follow up with urology as needed in the future   Acie Fredrickson 09/25/2015, 10:24 PM

## 2015-09-16 NOTE — Progress Notes (Signed)
Peripherally Inserted Central Catheter/Midline Placement  The IV Nurse has discussed with the patient and/or persons authorized to consent for the patient, the purpose of this procedure and the potential benefits and risks involved with this procedure.  The benefits include less needle sticks, lab draws from the catheter and patient may be discharged home with the catheter.  Risks include, but not limited to, infection, bleeding, blood clot (thrombus formation), and puncture of an artery; nerve damage and irregular heat beat.  Alternatives to this procedure were also discussed.  PICC/Midline Placement Documentation        Maximino Greenland 09/06/2015, 4:43 PM

## 2015-09-16 NOTE — Significant Event (Signed)
Rapid Response Event Note Called by staff to place a coude cath after unsuccessful attempts to place foley cath Overview: Time Called: 2118 Event Type: Other (Comment)  Initial Focused Assessment: Pt is very uncomfortable and has been unable void for quite sometime, though pt is unsure when.  Attempted to place 40f coude cath using aseptic technique following hospital protocol, but met with resistance and was unable to get the cath to pass.  Talked pt through relaxation techniques and attempted again without success.  Pt states he has had prostate surgery in the past and often has a difficult time voiding.  Spoke with Dr. QMarsa Arisand updated him.  Urology resident to come see pt.  Interventions:   Event Summary: Name of Physician Notified: Dr. QMarsa Arisat 2122    at    [place]     Chaundra Abreu Hedgecock

## 2015-09-16 NOTE — Progress Notes (Addendum)
ANTICOAGULATION CONSULT NOTE - Initial Consult  Pharmacy Consult for Heparin Indication: atrial fibrillation  Allergies not on file  Patient Measurements: Height: 5\' 7"  (170.2 cm) Weight: 117 lb 11.6 oz (53.4 kg) IBW/kg (Calculated) : 66.1 Heparin Dosing Weight: 53.4kg  Labs: No results for input(s): HGB, HCT, PLT, APTT, LABPROT, INR, HEPARINUNFRC, CREATININE, CKTOTAL, CKMB, TROPONINI in the last 72 hours.  Medical History: Patient is 74 yo with h/o PAF, CVA, HTN, heart falilure.  He has been on Eliquis PTA and we have been asked to start him on IV heparin.  Medications: (OBTAINED FROM RX RECORDS) Apixaban 5 mg daily   Durezol 0.05% eye drops (1 gtt. right eye four times daily)  Atorvastatin 40mg  daily  Digoxin 0.125mg  daily Bumetanide 1mg  daily  Dutasteride 0.5mg  daily Carvedilol 12.5mg  twice daily  Ketorolac0.5% opth. Solution (1 gtt. right eye four times daily) Entresto 49mg /51mg  twice daily Amiodarone 200 mg daily  Assessment: 74 yo with atrial fibrillation on chronic apixaban.  We have been asked to start him on IV heparin therapy for possible intervention.  He currently has no labs resulted.  Goal of Therapy:  Heparin level 0.3-0.7 units/ml Monitor platelets by anticoagulation protocol: Yes   Plan:  Heparin without a bolus at 800 units/hr Obtain 8 hour level and adjust as needed Monitor for bleeding complications Daily CBC/Heparin level  Nadara Mustard, PharmD., MS Clinical Pharmacist Pager:  [phone] Thank you for allowing pharmacy to be part of this patients care team. 09/24/2015,4:45 PM

## 2015-09-17 ENCOUNTER — Inpatient Hospital Stay (HOSPITAL_COMMUNITY): Payer: Medicare Other

## 2015-09-17 DIAGNOSIS — Z515 Encounter for palliative care: Secondary | ICD-10-CM

## 2015-09-17 DIAGNOSIS — I9589 Other hypotension: Secondary | ICD-10-CM

## 2015-09-17 DIAGNOSIS — I959 Hypotension, unspecified: Secondary | ICD-10-CM | POA: Diagnosis present

## 2015-09-17 DIAGNOSIS — I272 Other secondary pulmonary hypertension: Secondary | ICD-10-CM

## 2015-09-17 LAB — CARBOXYHEMOGLOBIN
CARBOXYHEMOGLOBIN: 1.3 % (ref 0.5–1.5)
CARBOXYHEMOGLOBIN: 1.7 % — AB (ref 0.5–1.5)
METHEMOGLOBIN: 0.8 % (ref 0.0–1.5)
Methemoglobin: 0.9 % (ref 0.0–1.5)
O2 Saturation: 60.5 %
O2 Saturation: 85.7 %
Total hemoglobin: 19.8 g/dL — ABNORMAL HIGH (ref 13.5–18.0)
Total hemoglobin: 15.9 g/dL (ref 13.5–18.0)

## 2015-09-17 LAB — CBC
HCT: 46.6 % (ref 39.0–52.0)
HEMOGLOBIN: 15.5 g/dL (ref 13.0–17.0)
MCH: 30.1 pg (ref 26.0–34.0)
MCHC: 33.3 g/dL (ref 30.0–36.0)
MCV: 90.5 fL (ref 78.0–100.0)
Platelets: 513 10*3/uL — ABNORMAL HIGH (ref 150–400)
RBC: 5.15 MIL/uL (ref 4.22–5.81)
RDW: 14.3 % (ref 11.5–15.5)
WBC: 10.6 10*3/uL — AB (ref 4.0–10.5)

## 2015-09-17 LAB — URINE MICROSCOPIC-ADD ON

## 2015-09-17 LAB — BASIC METABOLIC PANEL
ANION GAP: 9 (ref 5–15)
BUN: 35 mg/dL — ABNORMAL HIGH (ref 6–20)
CHLORIDE: 112 mmol/L — AB (ref 101–111)
CO2: 25 mmol/L (ref 22–32)
Calcium: 8.5 mg/dL — ABNORMAL LOW (ref 8.9–10.3)
Creatinine, Ser: 2 mg/dL — ABNORMAL HIGH (ref 0.61–1.24)
GFR calc non Af Amer: 31 mL/min — ABNORMAL LOW (ref >60)
GFR, EST AFRICAN AMERICAN: 36 mL/min — AB (ref >60)
Glucose, Bld: 180 mg/dL — ABNORMAL HIGH (ref 65–99)
POTASSIUM: 4.3 mmol/L (ref 3.5–5.1)
SODIUM: 146 mmol/L — AB (ref 135–145)

## 2015-09-17 LAB — URINALYSIS, ROUTINE W REFLEX MICROSCOPIC
Glucose, UA: NEGATIVE mg/dL
Ketones, ur: 15 mg/dL — AB
NITRITE: POSITIVE — AB
PH: 5 (ref 5.0–8.0)
Protein, ur: >300 mg/dL — AB
SPECIFIC GRAVITY, URINE: 1.015 (ref 1.005–1.030)

## 2015-09-17 LAB — HEPARIN LEVEL (UNFRACTIONATED): HEPARIN UNFRACTIONATED: 1.14 [IU]/mL — AB (ref 0.30–0.70)

## 2015-09-17 LAB — GLUCOSE, CAPILLARY
GLUCOSE-CAPILLARY: 132 mg/dL — AB (ref 65–99)
Glucose-Capillary: 143 mg/dL — ABNORMAL HIGH (ref 65–99)
Glucose-Capillary: 188 mg/dL — ABNORMAL HIGH (ref 65–99)
Glucose-Capillary: 363 mg/dL — ABNORMAL HIGH (ref 65–99)

## 2015-09-17 LAB — TSH: TSH: 5.612 u[IU]/mL — AB (ref 0.350–4.500)

## 2015-09-17 LAB — APTT: APTT: 75 s — AB (ref 24–37)

## 2015-09-17 LAB — T4, FREE: Free T4: 1.53 ng/dL — ABNORMAL HIGH (ref 0.61–1.12)

## 2015-09-17 LAB — HEMOGLOBIN A1C
HEMOGLOBIN A1C: 6.6 % — AB (ref 4.8–5.6)
MEAN PLASMA GLUCOSE: 143 mg/dL

## 2015-09-17 MED ORDER — INSULIN ASPART 100 UNIT/ML ~~LOC~~ SOLN
0.0000 [IU] | Freq: Three times a day (TID) | SUBCUTANEOUS | Status: DC
  Administered 2015-09-17: 2 [IU] via SUBCUTANEOUS
  Administered 2015-09-17 (×2): 1 [IU] via SUBCUTANEOUS

## 2015-09-17 MED ORDER — SULFAMETHOXAZOLE-TRIMETHOPRIM 400-80 MG PO TABS
1.0000 | ORAL_TABLET | Freq: Two times a day (BID) | ORAL | Status: DC
  Administered 2015-09-17: 1 via ORAL
  Filled 2015-09-17 (×2): qty 1

## 2015-09-17 MED ORDER — INSULIN ASPART 100 UNIT/ML ~~LOC~~ SOLN
5.0000 [IU] | Freq: Once | SUBCUTANEOUS | Status: AC
  Administered 2015-09-17: 5 [IU] via SUBCUTANEOUS

## 2015-09-17 MED ORDER — NOREPINEPHRINE BITARTRATE 1 MG/ML IV SOLN
0.0000 ug/min | INTRAVENOUS | Status: DC
  Administered 2015-09-17: 5 ug/min via INTRAVENOUS
  Administered 2015-09-17 (×2): 25 ug/min via INTRAVENOUS
  Filled 2015-09-17 (×4): qty 4

## 2015-09-17 MED ORDER — SODIUM CHLORIDE 0.9 % IV BOLUS (SEPSIS)
250.0000 mL | Freq: Once | INTRAVENOUS | Status: AC
  Administered 2015-09-17: 250 mL via INTRAVENOUS

## 2015-09-17 MED ORDER — PIPERACILLIN-TAZOBACTAM 3.375 G IVPB
3.3750 g | Freq: Three times a day (TID) | INTRAVENOUS | Status: DC
  Administered 2015-09-17 – 2015-09-27 (×28): 3.375 g via INTRAVENOUS
  Filled 2015-09-17 (×32): qty 50

## 2015-09-17 MED ORDER — INSULIN ASPART 100 UNIT/ML ~~LOC~~ SOLN: 9.0000 [IU] | Freq: Once | SUBCUTANEOUS | Status: DC

## 2015-09-17 MED ORDER — DEXTROSE 5 % IV SOLN
0.0000 ug/min | INTRAVENOUS | Status: DC
  Administered 2015-09-17: 29 ug/min via INTRAVENOUS
  Administered 2015-09-18 (×2): 50 ug/min via INTRAVENOUS
  Filled 2015-09-17 (×6): qty 16

## 2015-09-17 NOTE — Progress Notes (Signed)
CRITICAL VALUE ALERT  Critical value received:  PTT >200  Date of notification:  09/17/15  Time of notification:  1335  Critical value read back:Yes.    Nurse who received alert:  Exie Parody RN  MD notified (1st page): Heart Failure MD  Heparin gtt stopped.

## 2015-09-17 NOTE — Consult Note (Signed)
Consultation Note Date: 09/17/2015   Patient Name: Brad [name]  DOB: Mar 06, 1941  MRN: [id number]  Age / Sex: 74 y.o., male  PCP: No primary care provider on file. Referring Physician: Jolaine Artist, MD  Reason for Consultation: VAD eval    Clinical Assessment/Narrative: I met today with Brad [name] and his wife at bedside. RN told me that he was tired and becoming somewhat frustrated with all the people coming into his room. I reassured him that I am there for support and as frustrating as the VAD evaluation process may be it is with good intentions to make sure that we are making sure he is a good candidate and if so making sure he has all the information to make a good decision. Also told him that this is a good test for his patience if he receives VAD as this is a process as well. He says that he is undecided if he wants VAD or not. His main concerns are financial and making sure that his wife and son are cared for and have a living if something were to happen to him. He also wants to consider that the benefits outweigh the risks of VAD. I assured him that the evaluation process is to make sure that VAD will be likely to give him better quantity and quality of life (understanding there are complications and we cannot know for sure outcomes). He understands that there is a chance that VAD would not be offered and this would be because we believe the risks outweigh the benefits and that the team will be very open and honest with him regarding these recommendations (i.e. monitoring renal function). He seems reassured and understanding of this process.   He is not very open and forthcoming about his goals and QOL. He says that he and his wife have discussed his wishes and the "what ifs" but does not volunteer any information to me regarding these decisions. I did not press him. I did ask about Advance Directive and he says  that he does not have one but has thought about it and that he has mixed feelings about doing this. Unable to discern his concerns regarding Advance Directive. I encouraged the to continue talking about his wishes - this is more important. But also to consider directive as I believe this could help take some of the burden off his wife if she is faced with decisions for him. He agrees to continue to think of this. He is very tired so I left him to rest and left my contact info. I will not return until next Wed but may contact Brad Lessen, NP for further engagement from palliative care regarding VAD [phone] if needed.   Contacts/Participants in Discussion: Primary Decision Maker: Self   Relationship to Patient Next is wife HCPOA: no  No documented HCPOA - legally falls to his wife  SUMMARY OF RECOMMENDATIONS - Consider completing Advance Directives - Continue to provide information/education regarding VAD  - He appears very frail and does have some reservations about VAD at this time - Stressed that evaluation is to provide him information and also to make sure that we believe he could benefit from [place]: Full code - did not address specifically today    Code Status Orders        Start     Ordered   09/22/2015 1609  Full code   Continuous     09/17/2015 1615  Symptom Management:   Symptoms managed per heart failure team.   Palliative Prophylaxis:   Bowel Regimen, Delirium Protocol and Frequent Pain Assessment   Psycho-social/Spiritual:  Support System: Adequate Desire for further Chaplaincy support:no Additional Recommendations: Caregiving  Support/Resources  Prognosis: Unable to determine - very poor without VAD.   Discharge Planning: To be determined   Chief Complaint/ Primary Diagnoses: Present on Admission:  . Acute on chronic systolic and diastolic heart failure, NYHA class 4 ([place])  I have reviewed the medical record,  interviewed the patient and family, and examined the patient. The following aspects are pertinent.  Past Medical History  Diagnosis Date  . 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 CKD (chronic kidney disease) stage 3, GFR 30-59 ml/min   . V-tach ([place])   . PAF (paroxysmal atrial fibrillation) ([place])   . CVA (cerebral infarction)   . BPH (benign prostatic hyperplasia)   . HTN (hypertension), benign   . Hyperlipidemia    Social History   Social History  . Marital Status: Married    Spouse Name: N/A  . Number of Children: N/A  . Years of Education: N/A   Occupational History  . [employer]     part time   Social History Main Topics  . Smoking status: Never Smoker   . Smokeless tobacco: Not on file  . Alcohol Use: No  . Drug Use: No  . Sexual Activity: Not on file   Other Topics Concern  . Not on file   Social History Narrative  . No narrative on file   Family History  Problem Relation Age of Onset  . Hypertension Mother   . CVA Maternal Aunt    Scheduled Meds: . amiodarone  200 mg Oral Daily  . atorvastatin  40 mg Oral q1800  . insulin aspart  0-9 Units Subcutaneous TID WC  . sildenafil  20 mg Oral TID  . sodium chloride  10-40 mL Intracatheter Q12H  . sodium chloride  3 mL Intravenous Q12H   Continuous Infusions: . heparin 800 Units/hr (09/19/2015 2233)  . milrinone 0.375 mcg/kg/min (09/17/15 0332)   PRN Meds:.sodium chloride, acetaminophen, ondansetron (ZOFRAN) IV, sodium chloride, sodium chloride Medications Prior to Admission:  Prior to Admission medications   Medication Sig Start Date End Date Taking? Authorizing Provider  atorvastatin (LIPITOR) 40 MG tablet Take 40 mg by mouth daily. 06/23/15  Yes Historical Provider, MD  bumetanide (BUMEX) 1 MG tablet Take 1 mg by mouth daily. 08/11/15  Yes Historical Provider, MD  carvedilol (COREG) 12.5 MG tablet Take 12.5 mg by mouth 2 (two) times daily. 08/25/15  Yes Historical  Provider, MD  digoxin (LANOXIN) 0.125 MG tablet Take 125 mcg by mouth daily. 08/30/15  Yes Historical Provider, MD  dutasteride (AVODART) 0.5 MG capsule Take 0.5 mg by mouth daily. 07/07/15  Yes Historical Provider, MD  ELIQUIS 5 MG TABS tablet Take 5 mg by mouth 2 (two) times daily. 07/12/15  Yes Historical Provider, MD  ENTRESTO 49-51 MG Take 1 tablet by mouth 2 (two) times daily. 08/25/15  Yes Historical Provider, MD  tamsulosin (FLOMAX) 0.4 MG CAPS capsule Take 0.4 mg by mouth daily. 07/31/15  Yes Historical Provider, MD   No Known Allergies  Review of Systems  Constitutional: Positive for activity change and appetite change.  Respiratory: Positive for shortness of breath.   Cardiovascular: Positive for leg swelling.  Neurological: Positive for weakness.    Physical  Exam  Constitutional: He is oriented to person, place, and time. He appears well-developed.  Thin, frail  HENT:  Head: Normocephalic and atraumatic.  Cardiovascular: Normal rate.   Respiratory: Effort normal. No accessory muscle usage. No tachypnea. No respiratory distress.  GI: Normal appearance.  Neurological: He is alert and oriented to person, place, and time.  Psychiatric:  Somewhat flat affect.     Vital Signs: BP 89/57 mmHg  Pulse 60  Temp(Src) 97.5 F (36.4 C) (Oral)  Resp 12  Ht 5' 7"  (1.702 m)  Wt 52.3 kg (115 lb 4.8 oz)  BMI 18.05 kg/m2  SpO2 98%  SpO2: SpO2: 98 % O2 Device:SpO2: 98 % O2 Flow Rate: .   IO: Intake/output summary:  Intake/Output Summary (Last 24 hours) at 09/17/15 1036 Last data filed at 09/17/15 1000  Gross per 24 hour  Intake  186.6 ml  Output   2000 ml  Net -1813.4 ml    LBM: Last BM Date: 09/22/2015 Baseline Weight: Weight: 53.4 kg (117 lb 11.6 oz) Most recent weight: Weight: 52.3 kg (115 lb 4.8 oz)      Palliative Assessment/Data:  Flowsheet Rows        Most Recent Value   Intake Tab    Referral Department  Cardiology   Unit at Time of Referral  ICU   Palliative  Care Primary Diagnosis  Cardiac   Date Notified  09/12/2015   Palliative Care Type  New Palliative care   Reason for referral  Clarify Goals of Care, Other (Comment) [VAD Eval]   Date of Admission  09/03/2015   # of days IP prior to Palliative referral  0   Clinical Assessment    Psychosocial & Spiritual Assessment    Palliative Care Outcomes       Additional Data Reviewed:  CBC:    Component Value Date/Time   WBC 10.6* 09/17/2015 0357   HGB 15.5 09/17/2015 0357   HCT 46.6 09/17/2015 0357   PLT 513* 09/17/2015 0357   MCV 90.5 09/17/2015 0357   Comprehensive Metabolic Panel:    Component Value Date/Time   NA 146* 09/17/2015 0742   K 4.3 09/17/2015 0742   CL 112* 09/17/2015 0742   CO2 25 09/17/2015 0742   BUN 35* 09/17/2015 0742   CREATININE 2.00* 09/17/2015 0742   GLUCOSE 180* 09/17/2015 0742   CALCIUM 8.5* 09/17/2015 0742   AST 36 09/13/2015 2017   ALT 31 09/15/2015 2017   ALKPHOS 117 09/07/2015 2017   BILITOT 1.5* 09/11/2015 2017   PROT 5.7* 09/12/2015 2017   ALBUMIN 2.5* 09/22/2015 2017     Time In: 1155 Time Out: 1245 Time Total: 74mn Greater than 50%  of this time was spent counseling and coordinating care related to the above assessment and plan.  Signed by: PPershing Proud NP  APershing Proud NP  [id number] 10:36 AM  Please contact Palliative Medicine Team phone at [phone] questions and concerns.

## 2015-09-17 NOTE — Progress Notes (Signed)
   Called by staff RN for hypotension.   SBP 55-60s.  CVP 2  Provided new orders for norepi to maintain SBP >85. Milrinone cut back to 0.25 mcg. Give 250 cc NS bolus now.  Stop sildenafil.   Place Aline.    Dr Gala Romney as bedside .   Amy Clegg NP-C  2:07 PM  Patient seen and examined with Tonye Becket, NP. We discussed all aspects of the encounter. I agree with the assessment and plan as stated above.   Patient developed profound shock today with SBP in 50s and near syncopal episode. Required-high does levophed for short period. CVP 1. Given 500 cc IVF. Attempted to put in arterial line but unsuccessful. Suspect mostly due to volume depletion in setting of severe LV dysfunction but may also be related to early urosepsis from UTI. Bcx drawn. Abx expanded.   Repeat co-ox checked which was ok. No need for IABP at this point. He is still undecided about whether or not he wants to proceed with VAD work-up.  The patient is critically ill with multiple organ systems failure and requires high complexity decision making for assessment and support, frequent evaluation and titration of therapies, application of advanced monitoring technologies and extensive interpretation of multiple databases.   Critical Care Time devoted to patient care services described in this note is 45 Minutes. In addition to rounding time this am.   Kartier Bennison,MD 6:03 PM

## 2015-09-17 NOTE — Progress Notes (Signed)
Utilization review completed.  

## 2015-09-17 NOTE — Progress Notes (Signed)
Pt became more unstable SBP 55. Placed pt back to bed. HF NP at bedside. Orders placed for NS boluses. HF MD at bedside speaking with pt and pts wife. Will continue to monitor pt closely.

## 2015-09-17 NOTE — Progress Notes (Signed)
LVAD Initial Psychosocial Screening  Date Initiated: 09/17/2015  Referral Source: Brad Brad, VAD Coordinator Brad Grinder, NP  Referral Reason:  VAD implantatiom Source of Information:  Patient, Brad and chart review  Demographics Name:  Brad Brad Address:  [address] [place] [address] Home phone:  [phone] (home)   Cell:  No relevant phone numbers on file.   Marital Status:  Married  Faith:  No religion on file [place] Primary Language:  English SS:   [id number]  DOB:  Dec 14, 1940  Medical & Follow-up Adherence to Medical regimen/INR checks:  compliant Medication adherence: compliant  Physician/Clinic Appointment Attendance:  compliant   Advance Directives: Do you have a Living Will or Medical POA?  <no information> no Would you like to complete a Living Will and Medical POA prior to surgery?  no Do you have Goals of Care?  Discussed with both patient and Brad Have you had a consult with the Palliative Care Team at [facility]? Not yet  Psychological Health Appearance:  Well groomed in hospital gown  Mental Status:  Alert and oreinted Eye Contact:  good Thought Content:  coherent Speech: clear and soft  Mood: appropriate  Affect:  positive Insight:  good Judgement: sound Interaction Style:  interactive  Family/Social Information Who lives in your home? Name:   Relationship:  Phone: Brad Brad  Brad Brad  Other family members/support persons in your life? Name:   Relationship:  Phone: None  Caregiving Needs Who is the primary caregiver? Brad Brad Health status:  good Do you drive?  yes Do you work?  no Physical Limitations:  none Do you have other care giving responsibilities?  none Contact number:  Who is the secondary caregiver? Brad Brad son Health status:  excellent Do you drive?  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  none Contact number:  Home  Environment/Personal Care Do you own or rent your home? Own  14 years Number of steps into the home? 3 steps How many levels in the home? 1 level Assistive devices in the home? walker Electrical needs for LVAD (3 prong outlets)? yes Second hand smoke exposure in the home? no Travel distance from [facility]? 3.5 hours  Self-care: independent Ambulation: independent  Community Are you active with community agencies/resources/homecare? no Are you active in a church, synagogue, mosque or other faith based community?[facility] What other sources do you have for spiritual support? no Are you active in any clubs or social organizations? no What do you do for fun?  Hobbies?  Interests? Read and spend time with his Brad  Education/Work Information What is the last grade of school you completed? College degree Preferred method of learning?  Written, Verbal and Hands on Do you have any problems with reading or writing?  no Are you currently employed?  yes  When were you last employed? Part time  Name of employer? 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 5 years If you are not working, do you plan to return to work after VAD surgery? possibly If yes, what type of employment do you hope to find? Are you interested in job training or learning new skills? no Did you serve in the [place]?  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 no If yes, which ones? How do you cope with this?n/a Can you budget for the monthly cost for dressing supplies post procedure?  yes Primary Health insurance:  Hospital Account    Name Acct ID Class Status Primary Coverage   Brad Brad, Brad Brad [id number] Inpatient Open MEDICARE - MEDICARE PART A AND B        Guarantor Account (for Hospital Account [id number])    Name Relation to [place]? Acct Type   Brad Brad Self CHSA Yes Personal/Family   Address Phone       [place] [address] [phone])          Coverage Information (for Hospital Account [id number])    1. [place] PART A AND B    F/O Payor/Plan Precert #   MEDICARE/MEDICARE PART A AND B    Subscriber Subscriber #   Brad [name], Brad [name] [id number]   Address Phone   [address] [place], [place] [address]        2. CIGNA/CIGNA INDEMNITY    F/O Payor/Plan Precert #   CIGNA/CIGNA INDEMNITY    Subscriber Subscriber #   Brad Brad, Brad Brad [id number]   Address Phone   [address] [place] [address] [phone]         Secondary Insurance: Christella Scheuermann Prescription plan: D and [place]:  [facility] What are your prescription co-pays? vary Do you use mail order for your prescriptions?  no Have you ever had to refuse medication due to cost?  no Have you applied for Medicaid?  no Have you applied for Social Security Disability (SSI)  n/a  Medical Information Briefly describe why you are here for evaluation: Diagnosed with A-Fib in 2005 and then a pacemaker and a defib in2007 Do you have a PCP or other medical provider?no No primary care provider on file. Do you have a history of trauma, physical, emotional, or sexual abuse? no Do you have nay family history of heart problems?no Do you smoke now or past usage? never   Quit date: Are you currently using illegal drugs or misuse of medication or past usage?  never Have you ever been treated for substance abuse? n/a Where and when did you receive treatment? [facility] History How have you been feeling in the past year? Pretty good Have you ever had any problems with depression, anxiety or other mental health issues? no Do you see a counselor, psychiatrist or therapist?  no If you are currently experiencing problems are you interested in talking with a professional? no Have you or are you taking  medications for anxiety/depression or any mental health concerns?  no Current Medications: What are your coping strategies under stressful situations? "spritual" Are there any other stressors in your life? no Have you had any past or current thoughts of suicide? no How many hours do you sleep at night? 8-9 hours How is your appetite? great Would you be interested in attending the LVAD support group? Lives 3.5 hours away which will limit attendance PHQ2 Depression Scale:0 PHQ9 Depression scale (if positive PHQ2 screen):    Hospital Anxiety and Depression Screen (HADS) score:  Legal Do you currently have any legal issues/problems?  no Have you had any legal issues/problems in the past?  no Do you have a Durable POA? no  Name of Murphy? Contact number:    Plan for VAD Implementation Do you know and understand what happens during the VAD surgery? Patient verbalizes understanding of surgery, ICU, intubation and length  of hospitalization What do you know about the risks and side effect associated with VAD surgery? Patient verbalizes infection, stroke and death. Explain what will happen right after surgery: OR to ICU and intubated What is your plan for transportation for the first 8 weeks post-surgery? Brad Individual who will provide transportation for the patient:  Brad Brad- wife Do you have airbags in your vehicle? yes Patient reminded of the risk of discharging the device if the airbag were to deploy. What do you know about your diet post-surgery?  Patient states: heart healthy How do you plan to monitor your medications, current and future?  Patient states: pill box filled by self or Brad How do you plan to complete ADL's post-surgery?  Patient states: hope to be independent Will it be difficult to ask for help from your caregivers?  No will ask Brad and son  Patient's explanation of what they hope will be improved about their life as a result of receiving the LVAD: "drastically better-  getting my breathe" Patient's biggest concern or fear about living with the LVAD: unable to go to the beach with family Patient states the following coping mechanisms for concerns and fears of LVAD: I will learn to accept it Patient's understanding of how their body will change: patient denies any issues stating "not at my age. If I was 22 it might make a difference" Patient stated barriers to the LVAD surgery and follow-up:  none  Understanding of LVAD Patient states understanding of the following: Surgical procedures and risks, Electrical need for LVAD (3 prong outlets), Safety precautions with LVAD (water, etc.), LVAD daily self-care (dressing changes, computer check, extra supplies), Outpatient follow up (LVAD clinic appts, monitoring blood thinners) and Need for Emergency Planning  Discussed and Reviewed with Patient and Caregiver  Patient's current level of motivation to prepare for LVAD: Patient states he is unsure at the moment and would like to continue with evaluation to help with decision making process.  Patient's present Level of Consent for LVAD:  Patient states he is unsure at the moment and would like to continue with evaluation to help with decision making process.    Education provided to patient/family/caregiver:   Caregiver role and responsibiltiy, Financial planning for LVAD, Role of Clinical Education officer, museum and Signs of Depression and Anxiety  Caregiver questions Caregiver's explanation of what they hope will be improved about their life and loved one's life as a result of receiving the LVAD: We will be able to do a lot more together. Caregiver's biggest concern or fear about caregiving with an LVAD patient: "I think I would need to see first" Caregiver's plan for availability to provide care 24/7 x2 weeks post op and dressing changes ongoing: denies any issues Caregiver's relief/backup and their availability: denies any issues Caregiver's preferred method of learning:  Written, Verbal and Hands on  Do you drive? yes Caregiver states the following methods of handling stressful situations: sewing Do you think you can do this? yes Concerns about caregiving: no Do you provide caregiving to anyone else?  no [facility] Anxiety and Depression Screen (HADS) score:  Comments:   Caregiver's current level of motivation to prepare for LVAD: Brad states she will let patient make the decision and support him Caregiver's present level of consent for LVAD:   Clinical Impressions/Recommendations: Mr. Kinnear is a 74yo male who was admitted to [facility] last night. CSW met at bedside this morning for LVAD evaluation with patient and his Brad. Patient has been married for  many years and has a 55 yo son who resides at home with he and his Brad. Patient reports he is compliant with all medical follow up and denies any concerns with insurance. Patient rpeorts he does not have a Living Will or HPOA but has discussed goals of care with his Brad. His Brad reports she and son will be primary and secondary caregivers and no issues identified with their roles as caregivers. Patient lives 3.5 hours away from [facility] and Brad will provide any needed transportation for follow up appointments. He appears to have a strong faith base and family support form Brad and son. Patient works a part time job although unsure he will return post VAD implantation. He has no identified financial issues or insurance. He has no history of trauma, substance abuse, tobacco use or alcohol use. He denies any history of mental health and has no medications for depression. He scored a 0 on the PHQ2. He states that he has spritual support as his coping mechanism and denies any other stressors currently in his life. He appears to have a solid understanding of the LVAD implantation process and is agreeable to meet with a current LVAD patient. Patient states that he is unsure at the moment about pursuing LVAD  implantation but would like to continue to pursue evaluation to help in the decision making process. Patient appears to be a good candidate for LVAD implantation pending team approval. CSW will continue to be available as needed. Raquel Sarna, LCSW [phone]  Louann Liv, LCSW

## 2015-09-17 NOTE — Progress Notes (Signed)
ANTICOAGULATION CONSULT NOTE - Follow Up Consult  Pharmacy Consult for heparin Indication: atrial fibrillation   Labs:  Recent Labs  09-21-15 1802 21-Sep-2015 2017 09/17/15 0357 09/17/15 0358 09/17/15 0453  HGB 14.6  --  15.5  --   --   HCT 43.4  --  46.6  --   --   PLT 502*  --  513*  --   --   APTT  --   --   --   --  75*  HEPARINUNFRC  --   --   --  1.14*  --   CREATININE  --  2.04*  --   --   --       Assessment/Plan:  74yo male therapeutic on heparin with initial dosing while Eliquis on hold. Will continue gtt at current rate and confirm stable with additional PTT.   Vernard Gambles, PharmD, BCPS  09/17/2015,5:17 AM

## 2015-09-17 NOTE — Progress Notes (Addendum)
Advanced Heart Failure Rounding Note   Subjective:    Admitted from [facility] with cardiogenic shock for advanced heart failure. Started on milrinone 0.348mcg given 40 mg IV lasix. Negative 1.8 liters. Todays CO-OX 60.5%. Urology consulted due to urinary retention. Foley placed.   Denies SOB/Orthopnea.   RHC 08/31/2015 RA 11 RV 77/9 PA 74/25 (43) PCWP 23 Fick CO/CI 1.68/1.05 PA sat 43%  Hgb 6.6  TSH 5.6  Free T4 1.53  Uric Acid 8.4   Objective:   Weight Range:  Vital Signs:   Temp:  [97.3 F (36.3 C)-97.4 F (36.3 C)] 97.3 F (36.3 C) (12/21 2000) Pulse Rate:  [58-70] 58 (12/22 0600) Resp:  [0-33] 21 (12/22 0600) BP: (97-151)/(54-95) 107/58 mmHg (12/22 0600) SpO2:  [97 %-100 %] 97 % (12/22 0600) Weight:  [115 lb 4.8 oz (52.3 kg)-117 lb 11.6 oz (53.4 kg)] 115 lb 4.8 oz (52.3 kg) (12/22 0500) Last BM Date: 09/12/2015  Weight change: Filed Weights   09/17/2015 1553 09/17/15 0500  Weight: 117 lb 11.6 oz (53.4 kg) 115 lb 4.8 oz (52.3 kg)    Intake/Output:   Intake/Output Summary (Last 24 hours) at 09/17/15 0715 Last data filed at 09/17/15 0600  Gross per 24 hour  Intake  130.6 ml  Output   2000 ml  Net -1869.4 ml     Physical Exam: CVP 3 General:  Frail. In bed.  No resp difficulty HEENT: normal Neck: supple. JVP flat . Carotids 2+ bilat; no bruits. No lymphadenopathy or thryomegaly appreciated. Cor: PMI nondisplaced. Regular rate & rhythm. No rubs, or murmurs. +S3  Lungs: clear Abdomen: soft, nontender, nondistended. No hepatosplenomegaly. No bruits or masses. Good bowel sounds. Extremities: no cyanosis, clubbing, rash, edema RUE PICC  Neuro: alert & orientedx3, cranial nerves grossly intact. moves all 4 extremities w/o difficulty. Affect pleasant GU: Foley - Hematuria   Telemetry: AV paced 60  Labs: Basic Metabolic Panel:  Recent Labs Lab 09/25/2015 2017  NA 149*  K 4.3  CL 114*  CO2 24  GLUCOSE 181*  BUN 31*  CREATININE 2.04*  CALCIUM  8.6*    Liver Function Tests:  Recent Labs Lab 09/20/2015 2017  AST 36  ALT 31  ALKPHOS 117  BILITOT 1.5*  PROT 5.7*  ALBUMIN 2.5*   No results for input(s): LIPASE, AMYLASE in the last 168 hours. No results for input(s): AMMONIA in the last 168 hours.  CBC:  Recent Labs Lab 09/05/2015 1802 09/17/15 0357  WBC 8.0 10.6*  HGB 14.6 15.5  HCT 43.4 46.6  MCV 90.8 90.5  PLT 502* 513*    Cardiac Enzymes: No results for input(s): CKTOTAL, CKMB, CKMBINDEX, TROPONINI in the last 168 hours.  BNP: BNP (last 3 results)  Recent Labs  09/09/2015 1803  BNP >4500.0*    ProBNP (last 3 results) No results for input(s): PROBNP in the last 8760 hours.    Other results:  Imaging: Dg Chest Port 1 View  09/17/2015  CLINICAL DATA:  Dyspnea EXAM: PORTABLE CHEST 1 VIEW COMPARISON:  None. FINDINGS: Biventricular ICD/ pacer from the left. There is a right upper extremity PICC, tip at the lower SVC. Normal heart size and aortic contours. 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 There is no edema, consolidation, significant effusion, or pneumothorax. IMPRESSION: No edema or consolidation. Electronically Signed   By: Marnee Spring M.D.   On: 09/17/2015 05:25      Medications:     Scheduled Medications: . amiodarone  200 mg  Oral Daily  . atorvastatin  40 mg Oral q1800  . dutasteride  0.5 mg Oral Daily  . furosemide  40 mg Intravenous BID  . sildenafil  20 mg Oral TID  . sodium chloride  10-40 mL Intracatheter Q12H  . sodium chloride  3 mL Intravenous Q12H     Infusions: . heparin 800 Units/hr (09/13/2015 2233)  . milrinone 0.375 mcg/kg/min (09/17/15 0332)     PRN Medications:  sodium chloride, acetaminophen, ondansetron (ZOFRAN) IV, sodium chloride, sodium chloride   Assessment:   1. Cardiogenic shock 2. 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 RV mild to moderate HK  --s/p St. Jude CRT-D 3. PAF on Eiiquis   --This patients CHA2DS2-VASc  Score and unadjusted Ischemic Stroke Rate (% per year) is equal to 7.2 % stroke rate/year from a score of 5 Above score calculated as 1 point each if present [CHF, HTN, DM, Vascular=MI/PAD/Aortic Plaque, Age if 65-74, or Male] Above score calculated as 2 points each if present [Age > 75, or Stroke/TIA/TE] 4. 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 (previous baseline ~1.5) 6. HTN 7. Hyperlipidemia 8. Pulmonary HTN 9. Urinary Retention  Plan/Discussion:   Admitted from [facility] in cardiogenic shock for advanced heart failure work up. Started on milrinone 0.330mcg. Todays CO-OX is 60.5%. CVP 3 . Stop IV lasix. No bb with cardiogenic shock. On sildenafil 20 mg tid for elevated PVR. No room for hydralazine with soft BP. Will not be able to add imdur with sildenafil.  No Ace with CKD. Check SPEP/UPEP  On Amio 100 mg for H/O VT and PAF. TSH Free T4 elevated.On heparin for now in the event he needs LVAD.   ICD interrogated on admit.  AV delay extended from 23ms to . Hopefully this will allow for more LV filling time. No VT or AF.   On heparin for now in the event he needs LVAD.   HGB A1C 6.6. Start sliding scale.   Urology consult appreciated. Continue foley. Hematuria noted likely due to insertion of foley. Check UA.   Palliative Care and HF SW consult pending.   Try to get OOB to chair.   Length of Stay: 1   Amy Clegg NP-C   09/17/2015, 7:15 AM  Advanced Heart Failure Team Pager [phone] (M-F; 7a - 4p)  Please contact [facility] for night-coverage after hours (4p -7a ) and weekends on [email or web address]  Patient seen and examined with Tonye Becket, NP. We discussed all aspects of the encounter. I agree with the assessment and plan as stated above.   Diuresed well overnight. CVP 3. Co-ox up to 60% on milrinone 0.375. BMET pending. Had urinary retention last night and Foley  placed by Urology. If hematuria persists would hold heparin for a day or two.   He has very advanced HF and is quite frail but overall I think he may be acceptable VAD candidate if his creatinine comes back down. Low CVP is promising. Will need repeat RHC in next few days to reassess pulmonary pressures. Given his body habitus may need Heartware device.   Need to mobilize aggressively to prevent further deconditioning.    VAD team to see today.   Tylisa Alcivar,MD 8:05 AM  Addendum: Creatinine back at 2.0. Will check renal u/s.  Edwyn Inclan,MD 8:25 AM

## 2015-09-17 NOTE — Progress Notes (Signed)
Pt BP 60s/40s. Pt lethargic but states he  "feels fine".Paged Heart Failure NP and made aware. Levophed gtt ordered. Will continue to monitor the pt closely.

## 2015-09-18 ENCOUNTER — Encounter (HOSPITAL_COMMUNITY)
Admission: AD | Disposition: E | Payer: Self-pay | Source: Other Acute Inpatient Hospital | Attending: Cardiothoracic Surgery

## 2015-09-18 ENCOUNTER — Ambulatory Visit (HOSPITAL_COMMUNITY): Payer: Commercial Indemnity

## 2015-09-18 DIAGNOSIS — A419 Sepsis, unspecified organism: Secondary | ICD-10-CM

## 2015-09-18 DIAGNOSIS — R6521 Severe sepsis with septic shock: Secondary | ICD-10-CM

## 2015-09-18 HISTORY — PX: CARDIAC CATHETERIZATION: SHX172

## 2015-09-18 LAB — UIFE/LIGHT CHAINS/TP QN, 24-HR UR
% BETA, Urine: 72 %
ALPHA 1 URINE: 0.9 %
Albumin, U: 12.6 %
Alpha 2, Urine: 1.8 %
FREE LT CHN EXCR RATE: 17.1 mg/L (ref 1.35–24.19)
Free Kappa/Lambda Ratio: 10.06 (ref 2.04–10.37)
Free Lambda Lt Chains,Ur: 1.7 mg/L (ref 0.24–6.66)
GAMMA GLOBULIN URINE: 12.7 %
TOTAL PROTEIN, URINE-UPE24: 129.2 mg/dL

## 2015-09-18 LAB — CBC
HEMATOCRIT: 51.4 % (ref 39.0–52.0)
Hemoglobin: 18.3 g/dL — ABNORMAL HIGH (ref 13.0–17.0)
MCH: 31.4 pg (ref 26.0–34.0)
MCHC: 35.6 g/dL (ref 30.0–36.0)
MCV: 88.3 fL (ref 78.0–100.0)
Platelets: 625 10*3/uL — ABNORMAL HIGH (ref 150–400)
RBC: 5.82 MIL/uL — ABNORMAL HIGH (ref 4.22–5.81)
RDW: 14.3 % (ref 11.5–15.5)
WBC: 15.1 10*3/uL — ABNORMAL HIGH (ref 4.0–10.5)

## 2015-09-18 LAB — URINE CULTURE: Culture: 3000

## 2015-09-18 LAB — POCT I-STAT 3, VENOUS BLOOD GAS (G3P V)
ACID-BASE DEFICIT: 1 mmol/L (ref 0.0–2.0)
Acid-base deficit: 3 mmol/L — ABNORMAL HIGH (ref 0.0–2.0)
BICARBONATE: 20.5 meq/L (ref 20.0–24.0)
BICARBONATE: 21.8 meq/L (ref 20.0–24.0)
O2 Saturation: 56 %
O2 Saturation: 57 %
PCO2 VEN: 32.7 mmHg — AB (ref 45.0–50.0)
PH VEN: 7.421 — AB (ref 7.250–7.300)
PH VEN: 7.433 — AB (ref 7.250–7.300)
PO2 VEN: 28 mmHg — AB (ref 30.0–45.0)
PO2 VEN: 29 mmHg — AB (ref 30.0–45.0)
TCO2: 21 mmol/L (ref 0–100)
TCO2: 23 mmol/L (ref 0–100)
pCO2, Ven: 31.6 mmHg — ABNORMAL LOW (ref 45.0–50.0)

## 2015-09-18 LAB — BASIC METABOLIC PANEL
ANION GAP: 7 (ref 5–15)
ANION GAP: 9 (ref 5–15)
BUN: 38 mg/dL — ABNORMAL HIGH (ref 6–20)
BUN: 40 mg/dL — AB (ref 6–20)
CALCIUM: 7 mg/dL — AB (ref 8.9–10.3)
CO2: 21 mmol/L — ABNORMAL LOW (ref 22–32)
CO2: 21 mmol/L — ABNORMAL LOW (ref 22–32)
CREATININE: 2.65 mg/dL — AB (ref 0.61–1.24)
Calcium: 6.9 mg/dL — ABNORMAL LOW (ref 8.9–10.3)
Chloride: 103 mmol/L (ref 101–111)
Chloride: 113 mmol/L — ABNORMAL HIGH (ref 101–111)
Creatinine, Ser: 2.56 mg/dL — ABNORMAL HIGH (ref 0.61–1.24)
GFR calc Af Amer: 26 mL/min — ABNORMAL LOW (ref >60)
GFR, EST AFRICAN AMERICAN: 27 mL/min — AB (ref >60)
GFR, EST NON AFRICAN AMERICAN: 23 mL/min — AB (ref >60)
GFR, EST NON AFRICAN AMERICAN: 22 mL/min — AB (ref >60)
GLUCOSE: 175 mg/dL — AB (ref 65–99)
Glucose, Bld: 447 mg/dL — ABNORMAL HIGH (ref 65–99)
POTASSIUM: 3.7 mmol/L (ref 3.5–5.1)
Potassium: 4 mmol/L (ref 3.5–5.1)
SODIUM: 131 mmol/L — AB (ref 135–145)
Sodium: 143 mmol/L (ref 135–145)

## 2015-09-18 LAB — CARBOXYHEMOGLOBIN
CARBOXYHEMOGLOBIN: 1 % (ref 0.5–1.5)
CARBOXYHEMOGLOBIN: 1.1 % (ref 0.5–1.5)
Carboxyhemoglobin: 0.9 % (ref 0.5–1.5)
METHEMOGLOBIN: 0.7 % (ref 0.0–1.5)
METHEMOGLOBIN: 0.9 % (ref 0.0–1.5)
Methemoglobin: 0.9 % (ref 0.0–1.5)
O2 SAT: 41 %
O2 SAT: 61.7 %
O2 SAT: 68.2 %
TOTAL HEMOGLOBIN: 18.1 g/dL — AB (ref 13.5–18.0)
TOTAL HEMOGLOBIN: 18.1 g/dL — AB (ref 13.5–18.0)
Total hemoglobin: 13.7 g/dL (ref 13.5–18.0)

## 2015-09-18 LAB — HEPARIN LEVEL (UNFRACTIONATED): Heparin Unfractionated: 0.78 IU/mL — ABNORMAL HIGH (ref 0.30–0.70)

## 2015-09-18 LAB — POCT I-STAT 3, ART BLOOD GAS (G3+)
Acid-base deficit: 5 mmol/L — ABNORMAL HIGH (ref 0.0–2.0)
Bicarbonate: 15.9 mEq/L — ABNORMAL LOW (ref 20.0–24.0)
O2 SAT: 98 %
PCO2 ART: 21.9 mmHg — AB (ref 35.0–45.0)
PO2 ART: 100 mmHg (ref 80.0–100.0)
TCO2: 17 mmol/L (ref 0–100)
pH, Arterial: 7.47 — ABNORMAL HIGH (ref 7.350–7.450)

## 2015-09-18 LAB — GLUCOSE, CAPILLARY
GLUCOSE-CAPILLARY: 156 mg/dL — AB (ref 65–99)
Glucose-Capillary: 128 mg/dL — ABNORMAL HIGH (ref 65–99)
Glucose-Capillary: 133 mg/dL — ABNORMAL HIGH (ref 65–99)
Glucose-Capillary: 150 mg/dL — ABNORMAL HIGH (ref 65–99)

## 2015-09-18 LAB — LACTIC ACID, PLASMA
LACTIC ACID, VENOUS: 1.8 mmol/L (ref 0.5–2.0)
Lactic Acid, Venous: 1.6 mmol/L (ref 0.5–2.0)

## 2015-09-18 LAB — HEPATITIS PANEL, ACUTE
HCV AB: 0.1 {s_co_ratio} (ref 0.0–0.9)
HEP A IGM: NEGATIVE
HEP B C IGM: POSITIVE — AB
HEP B S AG: NEGATIVE

## 2015-09-18 LAB — HIV ANTIBODY (ROUTINE TESTING W REFLEX): HIV Screen 4th Generation wRfx: NONREACTIVE

## 2015-09-18 SURGERY — RIGHT HEART CATH
Anesthesia: LOCAL

## 2015-09-18 MED ORDER — SODIUM CHLORIDE 0.9 % IJ SOLN: 3.0000 mL | INTRAMUSCULAR | Status: DC | PRN

## 2015-09-18 MED ORDER — SODIUM CHLORIDE 0.9 % IV SOLN
250.0000 mL | INTRAVENOUS | Status: DC | PRN
  Administered 2015-09-19: 6 mL/h via INTRAVENOUS

## 2015-09-18 MED ORDER — HEPARIN SODIUM (PORCINE) 1000 UNIT/ML IJ SOLN
INTRAMUSCULAR | Status: DC | PRN
  Administered 2015-09-18: 4000 [IU] via INTRAVENOUS

## 2015-09-18 MED ORDER — VANCOMYCIN HCL IN DEXTROSE 1-5 GM/200ML-% IV SOLN
1000.0000 mg | Freq: Once | INTRAVENOUS | Status: AC
  Administered 2015-09-18: 1000 mg via INTRAVENOUS
  Filled 2015-09-18: qty 200

## 2015-09-18 MED ORDER — VANCOMYCIN HCL IN DEXTROSE 1-5 GM/200ML-% IV SOLN
1000.0000 mg | INTRAVENOUS | Status: DC
  Administered 2015-09-20: 1000 mg via INTRAVENOUS
  Filled 2015-09-18: qty 200

## 2015-09-18 MED ORDER — LIDOCAINE HCL (PF) 1 % IJ SOLN
INTRAMUSCULAR | Status: AC
  Filled 2015-09-18: qty 30

## 2015-09-18 MED ORDER — SODIUM CHLORIDE 0.9 % IV BOLUS (SEPSIS)
250.0000 mL | Freq: Once | INTRAVENOUS | Status: AC
  Administered 2015-09-18: 250 mL via INTRAVENOUS

## 2015-09-18 MED ORDER — SODIUM CHLORIDE 0.9 % IV SOLN: INTRAVENOUS | Status: DC | PRN

## 2015-09-18 MED ORDER — SODIUM CHLORIDE 0.9 % IV SOLN: 250.0000 mL | INTRAVENOUS | Status: DC | PRN

## 2015-09-18 MED ORDER — SODIUM CHLORIDE 0.9 % IV SOLN
INTRAVENOUS | Status: DC
  Administered 2015-09-18 – 2015-09-21 (×5): via INTRAVENOUS

## 2015-09-18 MED ORDER — IOHEXOL 350 MG/ML SOLN
INTRAVENOUS | Status: DC | PRN
  Administered 2015-09-18: 5 mL via INTRAVENOUS

## 2015-09-18 MED ORDER — ACETAMINOPHEN 325 MG PO TABS: 650.0000 mg | ORAL_TABLET | ORAL | Status: DC | PRN

## 2015-09-18 MED ORDER — INSULIN ASPART 100 UNIT/ML ~~LOC~~ SOLN: 0.0000 [IU] | Freq: Every day | SUBCUTANEOUS | Status: DC

## 2015-09-18 MED ORDER — SODIUM CHLORIDE 0.9 % IJ SOLN
3.0000 mL | INTRAMUSCULAR | Status: DC | PRN
  Administered 2015-09-21: 3 mL via INTRAVENOUS
  Filled 2015-09-18: qty 3

## 2015-09-18 MED ORDER — SODIUM CHLORIDE 0.9 % IV BOLUS (SEPSIS)
1000.0000 mL | Freq: Once | INTRAVENOUS | Status: AC
  Administered 2015-09-18: 1000 mL via INTRAVENOUS

## 2015-09-18 MED ORDER — LIDOCAINE HCL (PF) 1 % IJ SOLN
INTRAMUSCULAR | Status: DC | PRN
  Administered 2015-09-18: 14:00:00

## 2015-09-18 MED ORDER — DOPAMINE-DEXTROSE 3.2-5 MG/ML-% IV SOLN
INTRAVENOUS | Status: AC
  Administered 2015-09-18: 5 ug/kg/min
  Administered 2015-09-18: 5 ug/kg/min via INTRAVENOUS
  Filled 2015-09-18: qty 250

## 2015-09-18 MED ORDER — VASOPRESSIN 20 UNIT/ML IV SOLN
0.0300 [IU]/min | INTRAVENOUS | Status: DC
  Administered 2015-09-18 (×2): 0.03 [IU]/min via INTRAVENOUS
  Filled 2015-09-18 (×2): qty 2

## 2015-09-18 MED ORDER — HEPARIN SODIUM (PORCINE) 1000 UNIT/ML IJ SOLN
INTRAMUSCULAR | Status: AC
  Filled 2015-09-18: qty 1

## 2015-09-18 MED ORDER — SODIUM CHLORIDE 0.9 % IJ SOLN
3.0000 mL | Freq: Two times a day (BID) | INTRAMUSCULAR | Status: DC
  Administered 2015-09-18 – 2015-09-21 (×5): 3 mL via INTRAVENOUS

## 2015-09-18 MED ORDER — HEPARIN (PORCINE) IN NACL 100-0.45 UNIT/ML-% IJ SOLN
650.0000 [IU]/h | INTRAMUSCULAR | Status: DC
  Administered 2015-09-18: 600 [IU]/h via INTRAVENOUS
  Administered 2015-09-22: 650 [IU]/h via INTRAVENOUS
  Filled 2015-09-18 (×4): qty 250

## 2015-09-18 MED ORDER — SODIUM CHLORIDE 0.9 % IV BOLUS (SEPSIS)
500.0000 mL | Freq: Once | INTRAVENOUS | Status: AC
  Administered 2015-09-18: 500 mL via INTRAVENOUS

## 2015-09-18 MED ORDER — DOBUTAMINE IN D5W 4-5 MG/ML-% IV SOLN
INTRAVENOUS | Status: AC
  Filled 2015-09-18: qty 250

## 2015-09-18 MED ORDER — DOPAMINE-DEXTROSE 3.2-5 MG/ML-% IV SOLN
2.0000 ug/kg/min | INTRAVENOUS | Status: DC
  Administered 2015-09-18: 5 ug/kg/min via INTRAVENOUS

## 2015-09-18 MED ORDER — SODIUM CHLORIDE 0.9 % IJ SOLN
3.0000 mL | Freq: Two times a day (BID) | INTRAMUSCULAR | Status: DC
  Administered 2015-09-19 – 2015-09-21 (×2): 3 mL via INTRAVENOUS

## 2015-09-18 MED ORDER — INSULIN ASPART 100 UNIT/ML ~~LOC~~ SOLN
0.0000 [IU] | Freq: Three times a day (TID) | SUBCUTANEOUS | Status: DC
  Administered 2015-09-18: 1 [IU] via SUBCUTANEOUS
  Administered 2015-09-18: 2 [IU] via SUBCUTANEOUS
  Administered 2015-09-19: 1 [IU] via SUBCUTANEOUS
  Administered 2015-09-19 (×2): 2 [IU] via SUBCUTANEOUS
  Administered 2015-09-20: 1 [IU] via SUBCUTANEOUS
  Administered 2015-09-20: 2 [IU] via SUBCUTANEOUS
  Administered 2015-09-20 – 2015-09-21 (×3): 1 [IU] via SUBCUTANEOUS
  Administered 2015-09-22: 2 [IU] via SUBCUTANEOUS
  Administered 2015-09-22: 1 [IU] via SUBCUTANEOUS

## 2015-09-18 MED ORDER — HEPARIN (PORCINE) IN NACL 2-0.9 UNIT/ML-% IJ SOLN
INTRAMUSCULAR | Status: AC
  Filled 2015-09-18: qty 500

## 2015-09-18 MED ORDER — ONDANSETRON HCL 4 MG/2ML IJ SOLN: 4.0000 mg | Freq: Four times a day (QID) | INTRAMUSCULAR | Status: DC | PRN

## 2015-09-18 SURGICAL SUPPLY — 17 items
BALLN LINEAR 7.5FR IABP 40CC (BALLOONS) ×2
BALLOON LINEAR 7.5FR IABP 40CC (BALLOONS) ×1 IMPLANT
CATH SWAN GANZ VIP 7.5F (CATHETERS) ×2 IMPLANT
COVER PRB 48X5XTLSCP FOLD TPE (BAG) ×1 IMPLANT
COVER PROBE 5X48 (BAG) ×1
DEVICE SECURE STATLOCK IABP (MISCELLANEOUS) ×8 IMPLANT
HOVERMATT SINGLE USE (MISCELLANEOUS) ×2 IMPLANT
PACK CARDIAC CATHETERIZATION (CUSTOM PROCEDURE TRAY) ×2 IMPLANT
SET INTRODUCER MICROPUNCT 5F (INTRODUCER) ×2 IMPLANT
SHEATH PINNACLE 5F 10CM (SHEATH) ×2 IMPLANT
SHEATH PINNACLE 8F 10CM (SHEATH) ×2 IMPLANT
SLEEVE REPOSITIONING LENGTH 30 (MISCELLANEOUS) ×2 IMPLANT
TRANSDUCER W/STOPCOCK (MISCELLANEOUS) ×2 IMPLANT
TUBING ART PRESS 72  MALE/FEM (TUBING) ×1
TUBING ART PRESS 72 MALE/FEM (TUBING) ×1 IMPLANT
WIRE EMERALD 3MM-J .035X150CM (WIRE) ×2 IMPLANT
WIRE SAFE-T 1.5MM-J .035X260CM (WIRE) ×2 IMPLANT

## 2015-09-18 NOTE — CV Procedure (Signed)
Cardiac Cath:  Findings:  RA = 1 RV = 24/1/2 PA = 26/15 (19) PCW = 6 Fick cardiac output/index = 2.1/1.3 Thermo CO/CI =1.8/1.1 SVR = 2418 PVR = 6.0 WU FA sat = 98% PA sat = 57%, 58%  Assessment: 1. Profound cardiogenic shock with volume depletion despite aggressive fluid loading overnight (? Septic component)  Plan/Discussion:  IABP placed for cardiogenic shock. Will wean vasopressin. Continue milrinone and norepinephrine. Will follow hemodynamics and renal function closely to assess for VAD candidacy.  Bensimhon, Daniel,MD 1:46 PM

## 2015-09-18 NOTE — Procedures (Signed)
Arterial Catheter Insertion Procedure Note Brad [name] [id number] [birth year]  Procedure: Insertion of Arterial Catheter  Indications: Blood pressure monitoring  Procedure Details Consent: Risks of procedure as well as the alternatives and risks of each were explained to the (patient/caregiver).  Consent for procedure obtained. Time Out: Verified patient identification, verified procedure, site/side was marked, verified correct patient position, special equipment/implants available, medications/allergies/relevent history reviewed, required imaging and test results available.  Performed  Maximum sterile technique was used including antiseptics, cap, gloves, gown, hand hygiene, mask and sheet. Skin prep: Chlorhexidine; local anesthetic administered 20 gauge catheter was inserted into left radial artery using the Seldinger technique.  Evaluation Blood flow good; BP tracing good. Complications: No apparent complications.  Joneen Roach, AGACNP-BC [facility] Pulmonology/Critical Care Pager [phone] or [phone]  08/30/2015 2:56 AM

## 2015-09-18 NOTE — Progress Notes (Signed)
This am it was reported to Tonye Becket, NP that patient was on .25 of milrinone.  This same information was reported to dr bensimhon.  Upon reviewing the orders this evening it was noted that the order states milrinone at .125.  The order was modified to reflect the understanding that the patient has been on .25 all day today.

## 2015-09-18 NOTE — Plan of Care (Signed)
Cardiology [facility]  Called regarding hypotension despite maximal Levophed dosing (plus Milrinone, dose decreased from 0.375 to 0.25 today).  CVP & Co-ox were low.  UOP significantly diminished overnight.  An a-line was placed.  Aggressive IVF were given (1.5 L in 500 bolus increments).  Dopamine was added.  Antibiotics were broadened to include Vancomycin.    Lance Morin

## 2015-09-18 NOTE — Progress Notes (Signed)
VAD coordinator met patient and wife with Brad [name], HF team social worker.  Dr. Haroldine Laws has requested that pt undergo VAD evaluation, and social worker has been asked to complete social evaluation. Pt and wife agree to same.  VAD coordinator will come back later for further education re: complete VAD evaluation. Several attempts made throughout day to speak with patient and wife re: VAD evaluation, both pt and wife want more time to think about option before consenting to complete evaluation.    Assisted wife in finding local accommodations for the night.  VAD packet and coordinator contact info given for any further questions or needs.

## 2015-09-18 NOTE — Progress Notes (Signed)
PT Cancellation Note  Patient Details Name: Brad [name] MRN: [id number] DOB: 11-20-1940   Cancelled Treatment:    Reason Eval/Treat Not Completed: Medical issues which prohibited therapy (Pt on max dose Levo, for possible CATH later, HOLD per nsg)Thanks.     [name] F 08/31/2015, 10:09 AM [name] [phone] (pager)

## 2015-09-18 NOTE — Progress Notes (Signed)
Advanced Heart Failure Rounding Note   Subjective:    Admitted from [facility] with cardiogenic shock for advanced heart failure. Started on milrinone 0.363mcg given 40 mg IV lasix.   On 12/22 foley placed by Urology with UA sent--> UTI . Started on antibiotics. Later in the day he was hypotensive with SBP in the 60s. UTI noted with antibiotics started. Started norep and quickly titrated up to 40 mcg, milrinone was cut back to 0.25 mcg, and sildenafil was stopped.  Lactic Acid 1.6  Over night CO-OX went down to 41% and urine output dropped. Norepi was increased to 50 mcg, milrinone was continued, and dopamine was added. Todays CO-OX is 62%. CVP 1 so he was given 1.5 liters of fluid. UOP dropped ~10 cc per hour.  Denies SOB/Orthopnea.   Creatinine 2.0>2.56   RHC 09/20/2015 RA 11 RV 77/9 PA 74/25 (43) PCWP 23 Fick CO/CI 1.68/1.05 PA sat 43%  Hgb 6.6  TSH 5.6  Free T4 1.53  Uric Acid 8.4   Objective:   Weight Range:  Vital Signs:   Temp:  [97.2 F (36.2 C)-97.8 F (36.6 C)] 97.2 F (36.2 C) (12/23 0333) Pulse Rate:  [56-113] 107 (12/23 0630) Resp:  [0-29] 15 (12/23 0630) BP: (55-113)/(42-67) 75/57 mmHg (12/23 0400) SpO2:  [92 %-100 %] 99 % (12/23 0630) Arterial Line BP: (53-102)/(43-65) 87/60 mmHg (12/23 0630) Weight:  [122 lb 9.2 oz (55.6 kg)] 122 lb 9.2 oz (55.6 kg) (12/23 0500) Last BM Date: 09/17/2015  Weight change: Filed Weights   09/26/2015 1553 09/17/15 0500 09/25/2015 0500  Weight: 117 lb 11.6 oz (53.4 kg) 115 lb 4.8 oz (52.3 kg) 122 lb 9.2 oz (55.6 kg)    Intake/Output:   Intake/Output Summary (Last 24 hours) at 09/25/2015 0752 Last data filed at 09/11/2015 0630  Gross per 24 hour  Intake 4285.64 ml  Output    255 ml  Net 4030.64 ml     Physical Exam: CVP 1 General:  Frail. In bed.  No resp difficulty HEENT: normal Neck: supple. JVP flat . Carotids 2+ bilat; no bruits. No lymphadenopathy or thryomegaly appreciated. Cor: PMI nondisplaced. Regular  rate & rhythm. No rubs, or murmurs. +S3  Lungs: clear Abdomen: soft, nontender, nondistended. No hepatosplenomegaly. No bruits or masses. Good bowel sounds. Extremities: no cyanosis, clubbing, rash, edema RUE PICC  LUE radial arterial line Neuro: alert & orientedx3, cranial nerves grossly intact. moves all 4 extremities w/o difficulty. Affect pleasant GU: Foley - tea colored.   Telemetry: V paced 109  Labs: Basic Metabolic Panel:  Recent Labs Lab 08/30/2015 2017 09/17/15 0742 09/16/2015 0533  NA 149* 146* 131*  K 4.3 4.3 3.7  CL 114* 112* 103  CO2 24 25 21*  GLUCOSE 181* 180* 447*  BUN 31* 35* 38*  CREATININE 2.04* 2.00* 2.56*  CALCIUM 8.6* 8.5* 6.9*    Liver Function Tests:  Recent Labs Lab 08/29/2015 2017  AST 36  ALT 31  ALKPHOS 117  BILITOT 1.5*  PROT 5.7*  ALBUMIN 2.5*   No results for input(s): LIPASE, AMYLASE in the last 168 hours. No results for input(s): AMMONIA in the last 168 hours.  CBC:  Recent Labs Lab 09/23/2015 1802 09/17/15 0357 09/02/2015 0533  WBC 8.0 10.6* 15.1*  HGB 14.6 15.5 18.3*  HCT 43.4 46.6 51.4  MCV 90.8 90.5 88.3  PLT 502* 513* 625*    Cardiac Enzymes: No results for input(s): CKTOTAL, CKMB, CKMBINDEX, TROPONINI in the last 168 hours.  BNP: BNP (  last 3 results)  Recent Labs  08/29/2015 1803  BNP >4500.0*    ProBNP (last 3 results) No results for input(s): PROBNP in the last 8760 hours.    Other results:  Imaging: US Renal  09/17/2015  CLINICAL DATA:  Acute on chronic renal failure EXAM: RENAL / URINARY TRACT ULTRASOUND COMPLETE COMPARISON:  None. FINDINGS: Right Kidney: Length: 9.4 cm. 5 mm upper pole cyst. Echogenic renal parenchyma, suggesting medical renal disease. No hydronephrosis. Left Kidney: Length: 11.1 cm. Multiple renal cysts, measuring up to 1.3 cm in the lower pole. Echogenic renal parenchyma, suggesting medical renal disease. No hydronephrosis. Bladder: Decompressed by indwelling Foley catheter. Additional  comments: Suspected prostatomegaly indenting the base of the bladder. Bilateral pleural effusions. IMPRESSION: Echogenic renal parenchyma, suggesting medical renal disease. Bilateral renal cysts, measuring up to 1.3 cm in the left lower pole. No hydronephrosis. Bladder is decompressed by indwelling Foley catheter. Suspected prostatomegaly indenting the base the bladder. Bilateral pleural effusions. Electronically Signed   By: Charline Bills M.D.   On: 09/17/2015 12:32   Dg Chest Port 1 View  09/17/2015  CLINICAL DATA:  Dyspnea EXAM: PORTABLE CHEST 1 VIEW COMPARISON:  None. FINDINGS: Biventricular ICD/ pacer from the left. There is a right upper extremity PICC, tip at the lower SVC. Normal heart size and aortic contours. 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 There is no edema, consolidation, significant effusion, or pneumothorax. IMPRESSION: No edema or consolidation. Electronically Signed   By: Marnee Spring M.D.   On: 09/17/2015 05:25     Medications:     Scheduled Medications: . amiodarone  200 mg Oral Daily  . atorvastatin  40 mg Oral q1800  . DOBUTamine      . insulin aspart  0-5 Units Subcutaneous QHS  . insulin aspart  0-9 Units Subcutaneous TID WC  . piperacillin-tazobactam (ZOSYN)  IV  3.375 g Intravenous 3 times per day  . sodium chloride  10-40 mL Intracatheter Q12H  . sodium chloride  3 mL Intravenous Q12H  . [START ON 09/20/2015] vancomycin  1,000 mg Intravenous Q48H    Infusions: . DOPamine 7 mcg/kg/min (09/06/2015 0450)  . milrinone 0.25 mcg/kg/min (09/04/2015 0330)  . norepinephrine (LEVOPHED) Adult infusion 50 mcg/min (09/25/2015 0522)    PRN Medications: sodium chloride, Place/Maintain arterial line **AND** sodium chloride, acetaminophen, ondansetron (ZOFRAN) IV, sodium chloride, sodium chloride   Assessment:   1. Cardiogenic/septicshock 2. 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 RV mild to moderate HK  --s/p St. Jude CRT-D 3. PAF  on Eiiquis   --This patients CHA2DS2-VASc Score and unadjusted Ischemic Stroke Rate (% per year) is equal to 7.2 % stroke rate/year from a score of 5 Above score calculated as 1 point each if present [CHF, HTN, DM, Vascular=MI/PAD/Aortic Plaque, Age if 65-74, or Male] Above score calculated as 2 points each if present [Age > 75, or Stroke/TIA/TE] 4. 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 (previous baseline ~1.5) 6. HTN 7. Hyperlipidemia 8. Pulmonary HTN 9. Urinary Retention  Plan/Discussion:   Admitted from [facility] in cardiogenic shock for advanced heart failure work up. Started on milrinone 0.398mcg. Over night he declined with hypotension. Now on norepi 50 mcg, milrinone 0.25 mcg, dopamine 7 mcg.  Worsening renal function. CVP 1. Given 500 cc bolus now. Start NS 50 cc per hour. Will need decision about full support versus  comfort care today. He has not signed consent for LVAD because he wanted to wait. Unfortunately, he may miss the window for consideration of LVAD.   On Amio 200  mg for H/O VT and PAF. TSH Free T4 elevated. On heparin for now in the event he needs LVAD.   ICD interrogated on admit.  AV delay extended from 40ms to . Hopefully this will allow for more LV filling time. No VT or AF.   On heparin for now in the event he needs LVAD.   HGB A1C 6.6. Continue  sliding scale.   Urology consult appreciated. Continue foley. Hematuria resolved. UTI. Continue vancomycin and zosyn.   Palliative Care consult appreciated.   HF SW evaluation completed for potential LVAD.     Length of Stay: 2   Amy Clegg NP-C   09-27-15, 7:52 AM  Advanced Heart Failure Team Pager [phone] (M-F; 7a - 4p)  Please contact [facility] for night-coverage after hours (4p -7a ) and weekends on [email or web address]   Patient seen and examined with Tonye Becket, NP. We discussed all  aspects of the encounter. I agree with the assessment and plan as stated above.   He has deteriorated overnight. Now with septic physiology on top of cardiogenic shock. He has increasing pressor needs with worsening renal function. Requiring multiple fluid boluses and triple pressors. On max dose levophed. Also on milrinone and dopamine. CVP 1. Will change dopa to vasopressin. Can wean milrinone slowly.   Will plan Swan placement to direct further decision making and possible need for IABP. He was reluctant to proceed with mehanical support yesterday but seems to want to proceed at this point. He wants to wait for his wife to arrive.   Prognosis very guarded.   The patient is critically ill with multiple organ systems failure and requires high complexity decision making for assessment and support, frequent evaluation and titration of therapies, application of advanced monitoring technologies and extensive interpretation of multiple databases.   Critical Care Time devoted to patient care services described in this note is 45 Minutes.  Shakyla Nolley,MD 9:16 AM

## 2015-09-18 NOTE — Progress Notes (Signed)
ANTIBIOTIC CONSULT NOTE   Pharmacy Consult for Vancomycin  Indication: rule out sepsis  No Known Allergies  Patient Measurements: Height: 5\' 7"  (170.2 cm) Weight: 122 lb 9.2 oz (55.6 kg) IBW/kg (Calculated) : 66.1   Vital Signs: Temp: 97.2 F (36.2 C) (12/23 0333) Temp Source: Oral (12/23 0333) BP: 75/57 mmHg (12/23 0400) Pulse Rate: 111 (12/23 0515) Intake/Output from previous day: 12/22 0701 - 12/23 0700 In: 3973.7 [I.V.:2636.2; IV Piggyback:837.5] Out: 210 [Urine:210] Intake/Output from this shift: Total I/O In: 2434.2 [I.V.:1884.2; IV Piggyback:550] Out: 85 [Urine:85]  Labs:  Recent Labs  10/13/2015 1802 10/02/2015 2017 09/17/15 0357 09/17/15 0742  WBC 8.0  --  10.6*  --   HGB 14.6  --  15.5  --   PLT 502*  --  513*  --   CREATININE  --  2.04*  --  2.00*   Estimated Creatinine Clearance: 25.5 mL/min (by C-G formula based on Cr of 2). No results for input(s): VANCOTROUGH, VANCOPEAK, VANCORANDOM, GENTTROUGH, GENTPEAK, GENTRANDOM, TOBRATROUGH, TOBRAPEAK, TOBRARND, AMIKACINPEAK, AMIKACINTROU, AMIKACIN in the last 72 hours.   Microbiology: Recent Results (from the past 720 hour(s))  MRSA PCR Screening     Status: None   Collection Time: 10/12/2015  3:52 PM  Result Value Ref Range Status   MRSA by PCR NEGATIVE NEGATIVE Final    Comment:        The GeneXpert MRSA Assay (FDA approved for NASAL specimens only), is one component of a comprehensive MRSA colonization surveillance program. It is not intended to diagnose MRSA infection nor to guide or monitor treatment for MRSA infections.     Medical History: Past Medical History  Diagnosis Date  . 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 CKD (chronic kidney disease) stage 3, GFR 30-59 ml/min   . V-tach (HCC)   . PAF (paroxysmal atrial fibrillation) (HCC)   . CVA (cerebral infarction)   . BPH (benign prostatic hyperplasia)   . HTN (hypertension), benign   . Hyperlipidemia      Medications:  Scheduled:  . amiodarone  200 mg Oral Daily  . atorvastatin  40 mg Oral q1800  . DOBUTamine      . insulin aspart  0-9 Units Subcutaneous TID WC  . piperacillin-tazobactam (ZOSYN)  IV  3.375 g Intravenous 3 times per day  . sodium chloride  10-40 mL Intracatheter Q12H  . sodium chloride  3 mL Intravenous Q12H   Assessment: 74 y.o. male with hypotension/tachycardia, possible sepsis, for empiric antibiotics  Goal of Therapy:  Vancomycin trough level 15-20 mcg/ml  Plan:  Vancomycin 1 g IV q48h  Eddie Candle 09/22/2015,6:03 AM

## 2015-09-18 NOTE — Progress Notes (Signed)
Patient arrived from the cath lab with a right IJ swan ganz and a right femoral IABP.  Augmented pressures were between 150-180.  IABP frequency set at 1:1.  Dr Gala Romney was in and orders received to dc vasopressin due to high augmented pressures.  Vasopressin was dcd. Will cont to monitor.

## 2015-09-18 NOTE — Progress Notes (Signed)
RT attempted to place arterial line but was unsuccessful. RN aware and will notify MD.

## 2015-09-18 NOTE — Progress Notes (Signed)
ANTICOAGULATION CONSULT NOTE -  Follow Up Consult  Pharmacy Consult for Heparin Indication: atrial fibrillation  No Known Allergies  Patient Measurements: Height: 5\' 7"  (170.2 cm) Weight: 122 lb 9.2 oz (55.6 kg) IBW/kg (Calculated) : 66.1 Heparin Dosing Weight: 53.4kg  Labs:  Recent Labs  09/26/2015 1802 08/30/2015 2017 09/17/15 0357 09/17/15 0358 09/17/15 0453 09/17/15 0742 09/17/15 1200 2015-09-30 0533  HGB 14.6  --  15.5  --   --   --   --  18.3*  HCT 43.4  --  46.6  --   --   --   --  51.4  PLT 502*  --  513*  --   --   --   --  625*  APTT  --   --   --   --  75*  --  >200*  --   HEPARINUNFRC  --   --   --  1.14*  --   --   --   --   CREATININE  --  2.04*  --   --   --  2.00*  --  2.56*        Assessment:  Patient is 74 yo with h/o PAF, CVA, HTN, heart falilure.  He has been on Eliquis PTA and started on IV heparin as inpatient.  Heparin stopped 12/22 d/t hematuria.  He was taken to cath lab for IABP placement today and will restart heparin. Will use lower goal given recent bleeding.  CBC stable.   Will dose heparin by aPTT given recent apixiaban until cleared and HL correlate better with aPTT.   Goal of Therapy:  Heparin level 0.3-0.5 APTT 66-90 sec Monitor platelets by anticoagulation protocol: Yes   Plan:  Heparin drip 600 uts/hr APTT / HL in 6hr Daily aPTT and HL, CBC  Leota Sauers Pharm.D. CPP, BCPS Clinical Pharmacist [phone] 09/30/15 2:43 PM

## 2015-09-19 ENCOUNTER — Encounter (HOSPITAL_COMMUNITY): Payer: Self-pay | Admitting: Emergency Medicine

## 2015-09-19 LAB — CBC
HEMATOCRIT: 37.3 % — AB (ref 39.0–52.0)
HEMOGLOBIN: 13.4 g/dL (ref 13.0–17.0)
MCH: 31.2 pg (ref 26.0–34.0)
MCHC: 35.9 g/dL (ref 30.0–36.0)
MCV: 86.7 fL (ref 78.0–100.0)
Platelets: 411 10*3/uL — ABNORMAL HIGH (ref 150–400)
RBC: 4.3 MIL/uL (ref 4.22–5.81)
RDW: 14.1 % (ref 11.5–15.5)
WBC: 12.3 10*3/uL — AB (ref 4.0–10.5)

## 2015-09-19 LAB — GLUCOSE, CAPILLARY
GLUCOSE-CAPILLARY: 140 mg/dL — AB (ref 65–99)
GLUCOSE-CAPILLARY: 185 mg/dL — AB (ref 65–99)
Glucose-Capillary: 181 mg/dL — ABNORMAL HIGH (ref 65–99)
Glucose-Capillary: 164 mg/dL — ABNORMAL HIGH (ref 65–99)

## 2015-09-19 LAB — APTT
APTT: 116 s — AB (ref 24–37)
APTT: 120 s — AB (ref 24–37)
aPTT: 46 seconds — ABNORMAL HIGH (ref 24–37)
aPTT: 79 seconds — ABNORMAL HIGH (ref 24–37)

## 2015-09-19 LAB — BASIC METABOLIC PANEL
ANION GAP: 8 (ref 5–15)
Anion gap: 9 (ref 5–15)
BUN: 37 mg/dL — AB (ref 6–20)
BUN: 36 mg/dL — ABNORMAL HIGH (ref 6–20)
CHLORIDE: 114 mmol/L — AB (ref 101–111)
CHLORIDE: 114 mmol/L — AB (ref 101–111)
CO2: 21 mmol/L — ABNORMAL LOW (ref 22–32)
CO2: 19 mmol/L — AB (ref 22–32)
CREATININE: 2.39 mg/dL — AB (ref 0.61–1.24)
Calcium: 7 mg/dL — ABNORMAL LOW (ref 8.9–10.3)
Calcium: 7.6 mg/dL — ABNORMAL LOW (ref 8.9–10.3)
Creatinine, Ser: 2.31 mg/dL — ABNORMAL HIGH (ref 0.61–1.24)
GFR calc Af Amer: 29 mL/min — ABNORMAL LOW (ref >60)
GFR calc non Af Amer: 25 mL/min — ABNORMAL LOW (ref >60)
GFR calc non Af Amer: 26 mL/min — ABNORMAL LOW (ref >60)
GFR, EST AFRICAN AMERICAN: 30 mL/min — AB (ref >60)
GLUCOSE: 156 mg/dL — AB (ref 65–99)
Glucose, Bld: 166 mg/dL — ABNORMAL HIGH (ref 65–99)
POTASSIUM: 4 mmol/L (ref 3.5–5.1)
POTASSIUM: 3.8 mmol/L (ref 3.5–5.1)
SODIUM: 141 mmol/L (ref 135–145)
Sodium: 144 mmol/L (ref 135–145)

## 2015-09-19 LAB — CARBOXYHEMOGLOBIN
Carboxyhemoglobin: 1.2 % (ref 0.5–1.5)
METHEMOGLOBIN: 1 % (ref 0.0–1.5)
O2 Saturation: 71.8 %
Total hemoglobin: 10.7 g/dL — ABNORMAL LOW (ref 13.5–18.0)

## 2015-09-19 LAB — HEPARIN LEVEL (UNFRACTIONATED): HEPARIN UNFRACTIONATED: 0.76 [IU]/mL — AB (ref 0.30–0.70)

## 2015-09-19 NOTE — Progress Notes (Signed)
Urology Progress Note   Subjective: Prostatic urethral stricture, post TURP 2003; and now  cystoscopy and foley catheter insertion 09/05/2015. Pt  Is followed by Dr. Ladean Raya in [place]. Plan is for voiding trial 09/02/2015.     No acute urologic events overnight. Ambulation:   negative Flatus:    positive Bowel movement  positive  Pain: complete resolution  Objective:  Blood pressure 87/41, pulse 75, temperature 97.3 F (36.3 C), temperature source Core (Comment), resp. rate 23, height 5\' 7"  (1.702 m), weight 57.9 kg (127 lb 10.3 oz), SpO2 100 %.  Physical Exam:  General:  No acute distress, awake  Genitourinary:   Soft bladder Foley:  Clear urine    I/O last 3 completed shifts: In: 6882.6 [P.O.:100; I.V.:4940.9; IV Piggyback:1841.7] Out: 1900 [Urine:1900]  Recent Labs     09/16/2015  0533  09/19/15  0500  HGB  18.3*  13.4  WBC  15.1*  12.3*  PLT  625*  411*    Recent Labs     08/29/2015  2230  09/19/15  0500  NA  143  144  K  4.0  4.0  CL  113*  114*  CO2  21*  21*  BUN  40*  37*  CREATININE  2.65*  2.39*  CALCIUM  7.0*  7.0*  GFRNONAA  22*  25*  GFRAA  26*  29*     Recent Labs     09/19/15  0500  09/19/15  0815  APTT  46*  120*     Invalid input(s): ABG  Assessment/Plan:   Bladder neck contracture and stricture, plan for voiding trial if he does well.   Continue any current medications. Continue with foley plans.

## 2015-09-19 NOTE — Progress Notes (Signed)
ANTICOAGULATION CONSULT NOTE -  Follow Up Consult  Pharmacy Consult for Heparin Indication: atrial fibrillation  No Known Allergies  Patient Measurements: Height: 5\' 7"  (170.2 cm) Weight: 127 lb 10.3 oz (57.9 kg) IBW/kg (Calculated) : 66.1 Heparin Dosing Weight: 53.4kg  Labs:  Recent Labs  09/17/15 0357 09/17/15 0358  08/30/2015 0533 09/02/2015 2215 09/10/2015 2230 09/19/15 0500 09/19/15 0815 09/19/15 1540  HGB 15.5  --   --  18.3*  --   --  13.4  --   --   HCT 46.6  --   --  51.4  --   --  37.3*  --   --   PLT 513*  --   --  625*  --   --  411*  --   --   APTT  --   --   < >  --  116*  --  46* 120* 79*  HEPARINUNFRC  --  1.14*  --   --  0.78*  --   --  0.76*  --   CREATININE  --   --   < > 2.56*  --  2.65* 2.39*  --   --   < > = values in this interval not displayed.      Assessment: Patient is 74 yo with h/o PAF, CVA, HTN, heart falilure.  He has been on Eliquis PTA and started on IV heparin as inpatient.  Heparin stopped 12/22 d/t hematuria.  He was taken to cath lab for IABP placement today and will restart heparin.  Hematuria resolved.  Will use lower therapeutic goal given recent bleeding.  CBC stable.   Will dose heparin by aPTT given recent apixiaban until cleared and HL correlate better with aPTT.  Heparin drip 350 units/hr aPTT now in range at 79 sec (aPTT was drawn ~1.5 hrs early). No s/sx of bleeding noted.   Goal of Therapy:  Heparin level 0.3-0.5 APTT 66-90 sec Monitor platelets by anticoagulation protocol: Yes   Plan:  Continue Heparin drip at 350 units/hr APTT in 6hr to confirm  Daily aPTT and HL, CBC  Hazle Nordmann, PharmD Pharmacy Resident [phone] 09/19/2015 6:19 PM

## 2015-09-19 NOTE — Progress Notes (Signed)
PT Cancellation Note  Patient Details Name: Brad [name] MRN: [id number] DOB: 10-07-1940   Cancelled Treatment:    Reason Eval/Treat Not Completed: Medical issues which prohibited therapy (Pt with IABP. Will check back Monday.  ) Thanks.  [name] F 09/19/2015, 10:50 AM  [name] [phone] (pager)

## 2015-09-19 NOTE — Progress Notes (Signed)
ANTICOAGULATION CONSULT NOTE - Follow Up Consult  Pharmacy Consult for Heparin  Indication: atrial fibrillation, IABP  No Known Allergies  Patient Measurements: Height: 5\' 7"  (170.2 cm) Weight: 122 lb 9.2 oz (55.6 kg) IBW/kg (Calculated) : 66.1  Vital Signs: Temp: 96.6 F (35.9 C) (12/24 0000) Temp Source: Core (Comment) (12/23 2345) BP: 87/41 mmHg (12/23 1345) Pulse Rate: 60 (12/24 0000)  Labs:  Recent Labs  09/07/2015 1802  09/17/15 0357 09/17/15 0358 09/17/15 0453 09/17/15 0742 09/17/15 1200 2015-09-20 0533 09/20/2015 2215 2015-09-20 2230  HGB 14.6  --  15.5  --   --   --   --  18.3*  --   --   HCT 43.4  --  46.6  --   --   --   --  51.4  --   --   PLT 502*  --  513*  --   --   --   --  625*  --   --   APTT  --   --   --   --  75*  --  >200*  --  116*  --   HEPARINUNFRC  --   --   --  1.14*  --   --   --   --  0.78*  --   CREATININE  --   < >  --   --   --  2.00*  --  2.56*  --  2.65*  < > = values in this interval not displayed.  Estimated Creatinine Clearance: 19.2 mL/min (by C-G formula based on Cr of 2.65).   Assessment: 74 y/o M who was on heparin for afib, now has IABP in place, heparin was held on 12/22 for hematuria and re-started on 12/23. No current bleeding or infusion issues per RN. APTT is still elevated at 116  Goal of Therapy:  Heparin level 0.2-0.5 units/ml aPTT ~60-84 seconds Monitor platelets by anticoagulation protocol: Yes   Plan:  -Decrease heparin to 450 units/hr -0900 aPTT/HL  Abran Duke 09/19/2015,12:55 AM

## 2015-09-19 NOTE — Progress Notes (Signed)
Advanced Heart Failure Rounding Note   Subjective:    Admitted from [facility] with cardiogenic shock for advanced heart failure.   On 12/22 foley placed by Urology requiring urethral dilation -> UTI . ? Septic shock. On vanco/zosyn   IABP placed 12/23. Now on milrinone and levophed 14.  Vasopressin off.  More alert. Feels great. Denies dyspnea.    [place]wan #s RA 1 PA 23/9 PCWP 10 Therm CO/CI 3.8/2.3 PVR 1.9 WU SVR 1375  Creatinine improving 2.0>2.56 > 2.65> 2.39  Co-ox 72%   Objective:   Weight Range:  Vital Signs:   Temp:  [96.3 F (35.7 C)-97.5 F (36.4 C)] 96.3 F (35.7 C) (12/24 0835) Pulse Rate:  [47-112] 60 (12/24 0835) Resp:  [0-27] 14 (12/24 0835) BP: (78-140)/(41-68) 87/41 mmHg (12/23 1345) SpO2:  [92 %-100 %] 100 % (12/24 0835) Arterial Line BP: (78-184)/(23-86) 96/29 mmHg (12/24 0835) Weight:  [57.9 kg (127 lb 10.3 oz)] 57.9 kg (127 lb 10.3 oz) (12/24 0500) Last BM Date: 09/19/15  Weight change: Filed Weights   09/17/15 0500 09-21-2015 0500 09/19/15 0500  Weight: 52.3 kg (115 lb 4.8 oz) 55.6 kg (122 lb 9.2 oz) 57.9 kg (127 lb 10.3 oz)    Intake/Output:   Intake/Output Summary (Last 24 hours) at 09/19/15 0843 Last data filed at 09/19/15 0800  Gross per 24 hour  Intake 3444.78 ml  Output   1960 ml  Net 1484.78 ml     Physical Exam: CVP 1 General: Awake  In bed.  No resp difficulty HEENT: normal Neck: supple. RIJ swan. Carotids 2+ bilat; no bruits. No lymphadenopathy or thryomegaly appreciated. Cor: PMI nondisplaced. Regular rate & rhythm. No rubs, or murmurs. +S3  Lungs: clear Abdomen: soft, nontender, nondistended. No hepatosplenomegaly. No bruits or masses. Good bowel sounds. Extremities: no cyanosis, clubbing, rash, edema RUE PICC  LUE radial arterial line. R groin IABP Neuro: alert & orientedx3, cranial nerves grossly intact. moves all 4 extremities w/o difficulty. Affect pleasant GU: Foley - tea colored.   Telemetry: AV paced  60  Labs: Basic Metabolic Panel:  Recent Labs Lab 09/12/2015 2017 09/17/15 0742 09/21/15 0533 09-21-15 2230 09/19/15 0500  NA 149* 146* 131* 143 144  K 4.3 4.3 3.7 4.0 4.0  CL 114* 112* 103 113* 114*  CO2 24 25 21* 21* 21*  GLUCOSE 181* 180* 447* 175* 156*  BUN 31* 35* 38* 40* 37*  CREATININE 2.04* 2.00* 2.56* 2.65* 2.39*  CALCIUM 8.6* 8.5* 6.9* 7.0* 7.0*    Liver Function Tests:  Recent Labs Lab 09/14/2015 2017  AST 36  ALT 31  ALKPHOS 117  BILITOT 1.5*  PROT 5.7*  ALBUMIN 2.5*   No results for input(s): LIPASE, AMYLASE in the last 168 hours. No results for input(s): AMMONIA in the last 168 hours.  CBC:  Recent Labs Lab 09/14/2015 1802 09/17/15 0357 September 21, 2015 0533 09/19/15 0500  WBC 8.0 10.6* 15.1* 12.3*  HGB 14.6 15.5 18.3* 13.4  HCT 43.4 46.6 51.4 37.3*  MCV 90.8 90.5 88.3 86.7  PLT 502* 513* 625* 411*    Cardiac Enzymes: No results for input(s): CKTOTAL, CKMB, CKMBINDEX, TROPONINI in the last 168 hours.  BNP: BNP (last 3 results)  Recent Labs  09/09/2015 1803  BNP >4500.0*    ProBNP (last 3 results) No results for input(s): PROBNP in the last 8760 hours.    Other results:  Imaging: US Renal  09/17/2015  CLINICAL DATA:  Acute on chronic renal failure EXAM: RENAL / URINARY TRACT  ULTRASOUND COMPLETE COMPARISON:  None. FINDINGS: Right Kidney: Length: 9.4 cm. 5 mm upper pole cyst. Echogenic renal parenchyma, suggesting medical renal disease. No hydronephrosis. Left Kidney: Length: 11.1 cm. Multiple renal cysts, measuring up to 1.3 cm in the lower pole. Echogenic renal parenchyma, suggesting medical renal disease. No hydronephrosis. Bladder: Decompressed by indwelling Foley catheter. Additional comments: Suspected prostatomegaly indenting the base of the bladder. Bilateral pleural effusions. IMPRESSION: Echogenic renal parenchyma, suggesting medical renal disease. Bilateral renal cysts, measuring up to 1.3 cm in the left lower pole. No hydronephrosis.  Bladder is decompressed by indwelling Foley catheter. Suspected prostatomegaly indenting the base the bladder. Bilateral pleural effusions. Electronically Signed   By: Charline Bills M.D.   On: 09/17/2015 12:32     Medications:     Scheduled Medications: . amiodarone  200 mg Oral Daily  . atorvastatin  40 mg Oral q1800  . insulin aspart  0-5 Units Subcutaneous QHS  . insulin aspart  0-9 Units Subcutaneous TID WC  . piperacillin-tazobactam (ZOSYN)  IV  3.375 g Intravenous 3 times per day  . sodium chloride  10-40 mL Intracatheter Q12H  . sodium chloride  3 mL Intravenous Q12H  . sodium chloride  3 mL Intravenous Q12H  . sodium chloride  3 mL Intravenous Q12H  . [START ON 09/20/2015] vancomycin  1,000 mg Intravenous Q48H    Infusions: . sodium chloride 50 mL/hr at 09/19/15 0651  . DOPamine Stopped (08/30/2015 1151)  . heparin 450 Units/hr (09/19/15 0059)  . milrinone 0.25 mcg/kg/min (09/19/15 0000)  . norepinephrine (LEVOPHED) Adult infusion 10 mcg/min (09/19/15 0830)  . vasopressin (PITRESSIN) infusion - *FOR SHOCK* Stopped (09/19/15 0730)    PRN Medications: sodium chloride, Place/Maintain arterial line **AND** sodium chloride, sodium chloride, sodium chloride, acetaminophen, acetaminophen, ondansetron (ZOFRAN) IV, ondansetron (ZOFRAN) IV, sodium chloride, sodium chloride, sodium chloride, sodium chloride   Assessment:   1. Cardiogenic/septicshock    --IABP placed 12/23 2. 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 RV mild to moderate HK  --s/p [employer] CRT-D 3. PAF on Eiiquis  4. 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 (previous baseline ~1.5) 6. HTN 7. Hyperlipidemia 8. Pulmonary HTN 9. Urinary Retention s/p urethral dilation  Plan/Discussion:    Much, much improved with IABP. Ernestine Conrad numbers done personally at bedside and cardiac output up. Filling  pressures look good off lasix. PVR and SVR much improved. With UTI, marked drop in CVP and temporary vasopressin dependence suspect he also had component of urosepsis now improved with abx. Continue IABP, milrinone, levophed. Heparin for IABP. I have asked EP to turn back up HR to 70.   Renal function improving with MCS. Will need to see how much better it gets. Will hold diuretics for now.   Long talk about possibility of durable VAD for DT today but he remains unsure. I explained that given degree of HF he has no other durable options that will provide him meaningful survival.  The patient is critically ill with multiple organ systems failure and requires high complexity decision making for assessment and support, frequent evaluation and titration of therapies, application of advanced monitoring technologies and extensive interpretation of multiple databases.   Critical Care Time devoted to patient care services described in this note is 45 Minutes.  Nazire Fruth,MD 8:43 AM Advanced Heart Failure Team Pager [phone] (M-F; 7a - 4p)  Please contact  [facility] for night-coverage after hours (4p -7a ) and weekends on [email or web address]

## 2015-09-19 NOTE — Progress Notes (Signed)
ANTICOAGULATION CONSULT NOTE -  Follow Up Consult  Pharmacy Consult for Heparin Indication: atrial fibrillation  No Known Allergies  Patient Measurements: Height: 5\' 7"  (170.2 cm) Weight: 127 lb 10.3 oz (57.9 kg) IBW/kg (Calculated) : 66.1 Heparin Dosing Weight: 53.4kg  Labs:  Recent Labs  09/17/15 0357 09/17/15 0358  09/03/2015 0533 09/21/2015 2215 09/19/2015 2230 09/19/15 0500 09/19/15 0815  HGB 15.5  --   --  18.3*  --   --  13.4  --   HCT 46.6  --   --  51.4  --   --  37.3*  --   PLT 513*  --   --  625*  --   --  411*  --   APTT  --   --   < >  --  116*  --  46* 120*  HEPARINUNFRC  --  1.14*  --   --  0.78*  --   --  0.76*  CREATININE  --   --   < > 2.56*  --  2.65* 2.39*  --   < > = values in this interval not displayed.      Assessment:  Patient is 74 yo with h/o PAF, CVA, HTN, heart falilure.  He has been on Eliquis PTA and started on IV heparin as inpatient.  Heparin stopped 12/22 d/t hematuria.  He was taken to cath lab for IABP placement today and will restart heparin.  Hematuria resolved.  Will use lower therapeutic goal given recent bleeding.  CBC stable.   Will dose heparin by aPTT given recent apixiaban until cleared and HL correlate better with aPTT.  Heparin drip 450 uts/hr aPTT remains elevated 120 sec despite lower heparin drip rate. Level drawn from A-line not near heparin infusion.    Goal of Therapy:  Heparin level 0.3-0.5 APTT 66-90 sec Monitor platelets by anticoagulation protocol: Yes   Plan:  Decrease Heparin drip 350 uts/hr APTT  in 6hr Daily aPTT and HL, CBC  Leota Sauers Pharm.D. CPP, BCPS Clinical Pharmacist [phone] 09/19/2015 9:14 AM

## 2015-09-20 ENCOUNTER — Inpatient Hospital Stay (HOSPITAL_COMMUNITY): Payer: Medicare Other

## 2015-09-20 DIAGNOSIS — Z01810 Encounter for preprocedural cardiovascular examination: Secondary | ICD-10-CM

## 2015-09-20 DIAGNOSIS — I509 Heart failure, unspecified: Secondary | ICD-10-CM

## 2015-09-20 DIAGNOSIS — I480 Paroxysmal atrial fibrillation: Secondary | ICD-10-CM

## 2015-09-20 DIAGNOSIS — N183 Chronic kidney disease, stage 3 (moderate): Secondary | ICD-10-CM

## 2015-09-20 DIAGNOSIS — I5043 Acute on chronic combined systolic (congestive) and diastolic (congestive) heart failure: Secondary | ICD-10-CM

## 2015-09-20 LAB — CBC
HEMATOCRIT: 35.7 % — AB (ref 39.0–52.0)
HEMOGLOBIN: 12.6 g/dL — AB (ref 13.0–17.0)
MCH: 30.8 pg (ref 26.0–34.0)
MCHC: 35.3 g/dL (ref 30.0–36.0)
MCV: 87.3 fL (ref 78.0–100.0)
Platelets: 327 10*3/uL (ref 150–400)
RBC: 4.09 MIL/uL — AB (ref 4.22–5.81)
RDW: 14.2 % (ref 11.5–15.5)
WBC: 10.6 10*3/uL — AB (ref 4.0–10.5)

## 2015-09-20 LAB — GLUCOSE, CAPILLARY
Glucose-Capillary: 165 mg/dL — ABNORMAL HIGH (ref 65–99)
Glucose-Capillary: 139 mg/dL — ABNORMAL HIGH (ref 65–99)
Glucose-Capillary: 132 mg/dL — ABNORMAL HIGH (ref 65–99)

## 2015-09-20 LAB — BASIC METABOLIC PANEL
Anion gap: 7 (ref 5–15)
BUN: 32 mg/dL — AB (ref 6–20)
CHLORIDE: 116 mmol/L — AB (ref 101–111)
CO2: 20 mmol/L — AB (ref 22–32)
CREATININE: 2.08 mg/dL — AB (ref 0.61–1.24)
Calcium: 7.6 mg/dL — ABNORMAL LOW (ref 8.9–10.3)
GFR calc Af Amer: 34 mL/min — ABNORMAL LOW (ref >60)
GFR calc non Af Amer: 30 mL/min — ABNORMAL LOW (ref >60)
GLUCOSE: 163 mg/dL — AB (ref 65–99)
POTASSIUM: 3.6 mmol/L (ref 3.5–5.1)
SODIUM: 143 mmol/L (ref 135–145)

## 2015-09-20 LAB — CARBOXYHEMOGLOBIN
Carboxyhemoglobin: 1.5 % (ref 0.5–1.5)
METHEMOGLOBIN: 0.8 % (ref 0.0–1.5)
O2 Saturation: 72.1 %
Total hemoglobin: 12.4 g/dL — ABNORMAL LOW (ref 13.5–18.0)

## 2015-09-20 LAB — PREALBUMIN: PREALBUMIN: 9.6 mg/dL — AB (ref 18–38)

## 2015-09-20 LAB — LACTATE DEHYDROGENASE: LDH: 524 U/L — ABNORMAL HIGH (ref 98–192)

## 2015-09-20 LAB — HEPARIN LEVEL (UNFRACTIONATED)
HEPARIN UNFRACTIONATED: 0.3 [IU]/mL (ref 0.30–0.70)
HEPARIN UNFRACTIONATED: 0.32 [IU]/mL (ref 0.30–0.70)
Heparin Unfractionated: 0.34 IU/mL (ref 0.30–0.70)

## 2015-09-20 LAB — ANTITHROMBIN III: AntiThromb III Func: 76 % (ref 75–120)

## 2015-09-20 LAB — APTT
APTT: 70 s — AB (ref 24–37)
APTT: 62 s — AB (ref 24–37)
aPTT: 80 seconds — ABNORMAL HIGH (ref 24–37)

## 2015-09-20 MED ORDER — HYDRALAZINE HCL 50 MG PO TABS
50.0000 mg | ORAL_TABLET | Freq: Three times a day (TID) | ORAL | Status: DC
  Administered 2015-09-20 – 2015-09-23 (×8): 50 mg via ORAL
  Filled 2015-09-20 (×8): qty 1

## 2015-09-20 MED ORDER — VANCOMYCIN HCL IN DEXTROSE 750-5 MG/150ML-% IV SOLN: 750.0000 mg | INTRAVENOUS | Status: DC

## 2015-09-20 MED ORDER — VANCOMYCIN HCL IN DEXTROSE 1-5 GM/200ML-% IV SOLN
1000.0000 mg | INTRAVENOUS | Status: DC
  Filled 2015-09-20: qty 200

## 2015-09-20 MED ORDER — HYDRALAZINE HCL 20 MG/ML IJ SOLN
10.0000 mg | INTRAMUSCULAR | Status: DC | PRN
  Administered 2015-09-20 – 2015-09-21 (×3): 10 mg via INTRAVENOUS
  Filled 2015-09-20 (×4): qty 1

## 2015-09-20 MED ORDER — HYDRALAZINE HCL 25 MG PO TABS
25.0000 mg | ORAL_TABLET | Freq: Three times a day (TID) | ORAL | Status: DC
  Administered 2015-09-20: 25 mg via ORAL
  Filled 2015-09-20: qty 1

## 2015-09-20 NOTE — Progress Notes (Signed)
LVAD Coordinator Note:  Notified from Dr. Haroldine Laws that Mr. Sackmann would now like to proceed with Heartmate II LVAD implant for DT. Up until now he and his wife have been unwilling to commit to evaluation process. Met with a current LVAD patient and they were able to have many questions answered yesterday during that visit. Called and spoke with Jarrett Soho his nurse today to coordinate a time tomorrow Monday 12/26 for me to come do some pre-LVAD teaching and INTERMACs completion.   Patient is currently INTERMACs I where he is IABP and dual pressor dependent to avoid end organ dysfunction; will proceed with evaluation as we are able considering limitations as patient is currently unable to travel off the unit. D/W team and with [facility] restriction bedside spirometry is not a possibility at this time.   Have entered lab orders as well as vascular imaging to be done by Monday to have results prior to implant Wed/Thursday of this week. Dr. Cyndia Bent to see patient today; they have met with palliative care and LVAD social worker last week and have no concerning issues per their encounters.   Spoke with Mr. And Mrs. Deviney and have coordinated 11:00 tomorrow morning for pre-teaching/INTERMACs completion.   Janene Madeira, BSN, RN, CCRN [place] Coordinator   Office: [phone] 24/7 [place] Pager: [phone]

## 2015-09-20 NOTE — Progress Notes (Signed)
ANTICOAGULATION CONSULT NOTE -  Follow Up Consult  Pharmacy Consult for Heparin Indication: atrial fibrillation   No Known Allergies  Patient Measurements: Height: 5\' 7"  (170.2 cm) Weight: 132 lb 15 oz (60.3 kg) IBW/kg (Calculated) : 66.1 Heparin Dosing Weight: 53.4kg  Labs:  Recent Labs  09/08/2015 0533  09/19/15 0500  09/19/15 1540 09/19/15 1850 09/20/15 09/20/15 0540 09/20/15 0751 09/20/15 1430  HGB 18.3*  --  13.4  --   --   --   --  12.6*  --   --   HCT 51.4  --  37.3*  --   --   --   --  35.7*  --   --   PLT 625*  --  411*  --   --   --   --  327  --   --   APTT  --   < > 46*  < > 79*  --  62*  --  80*  --   HEPARINUNFRC  --   < >  --   < >  --   --  0.30  --  0.34 0.32  CREATININE 2.56*  < > 2.39*  --   --  2.31*  --  2.08*  --   --   < > = values in this interval not displayed.   Assessment: Patient is 74 yo with h/o PAF, CVA, HTN, heart falilure. Has been on Eliquis PTA and started on IV heparin as inpatient. Heparin stopped 12/22 d/t hematuria.  He was taken to cath lab for IABP placement 12/23 and resumed heparin after. Hematuria resolved. Will use lower therapeutic goal given recent bleeding. CBC stable.  HL and APTT remain therapeutic at 0.32 on heparin at 500 units/hour. No s/sx of bleeding noted. Will increase heparin slightly to 550 units/hr since HL trended down.   Goal of Therapy:  Heparin level 0.3-0.5 APTT 66-90 sec Monitor platelets by anticoagulation protocol: Yes    Plan:  Increase Heparin drip at 550 units/hr Daily HL, CBC, and monitor s/sx bleeding  Greggory Stallion, PharmD Clinical Pharmacy Resident Pager # [phone] 09/20/2015 3:31 PM

## 2015-09-20 NOTE — Progress Notes (Signed)
ANTICOAGULATION CONSULT NOTE  Pharmacy Consult for Heparin Indication: Afib/IABP  No Known Allergies  Patient Measurements: Height: 5\' 7"  (170.2 cm) Weight: 127 lb 10.3 oz (57.9 kg) IBW/kg (Calculated) : 66.1 Heparin Dosing Weight: 53.4kg  Labs:  Recent Labs  09/17/15 0357  09/01/2015 0533 09/07/2015 2215 09/19/2015 2230 09/19/15 0500 09/19/15 0815 09/19/15 1540 09/19/15 1850 09/20/15  HGB 15.5  --  18.3*  --   --  13.4  --   --   --   --   HCT 46.6  --  51.4  --   --  37.3*  --   --   --   --   PLT 513*  --  625*  --   --  411*  --   --   --   --   APTT  --   < >  --  116*  --  46* 120* 79*  --  62*  HEPARINUNFRC  --   < >  --  0.78*  --   --  0.76*  --   --  0.30  CREATININE  --   < > 2.56*  --  2.65* 2.39*  --   --  2.31*  --   < > = values in this interval not displayed.  Assessment: 74 y.o. male with h/o Afib, IABP  and Eliquis on hold, for heparin  Goal of Therapy:  Heparin level 0.3-0.5 APTT 66-90 sec Monitor platelets by anticoagulation protocol: Yes   Plan:  Increase Heparin 500 units/hr F/U PTT/Heparin level in AM  Geannie Risen, PharmD, BCPS    09/20/2015 1:10 AM

## 2015-09-20 NOTE — Consult Note (Signed)
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 Glori Bickers  Brad [name] is an 74 y.o. male.  HPI:   The patient is a 74 year old gentleman with a history of hypertension, hyperlipidemia, stage 3 chronic kidney disease, paroxysmal atrial fibrillation on Eliquis, prior stroke without residual deficit, and chronic systolic heart failure dating back to 2007. An echo at that time showed an EF of 15% and cath reportedly showed an EF of 10% with moderate to severe pulmonary hypertension and 30-40% LAD stenosis. He had echos every two years and his EF maintained at 25-30%. He was reportedly referred last year to [facility] in [place] for consideration of advanced therapies and it was felt to be too early but a CPX was ordered but never done. He has been followed in [facility] and now presents with a 2 month history of progressive severe fatigue and shortness of breath. His has been unable to do any activities. He says that he has developed some swelling in legs but mild. He denies chest pain, orthopnea and PND. He has been eating and has not lost significant weight. An echo there showed an EF of 5% with global hypokinesis, mild to moderate RV dysfunction. RHC showed:  RA 11 RV 77/9 PA 74/25 (43) PCWP 23 Fick CO/CI 1.68/1.05 PA sat 43%  He was started on dobutamine and transferred here in cardiogenic shock. He was started on Milrinone 0.375 here and developed hypotension requiring levophed. Milrinone was decreased to 0.25 and sildenafil stopped. He was diuresed and his Co-ox went down to 41% with oliguria requiring increased levophed and dopamine. This improved his Co-ox and since the CVP was only 1 he was given fluid.  He had to have a urethral dilation and foley insertion. There was  concern about septic physiology due to UTI in addition to cardiogenic shock and dopamine was switched to vasopressin. He was very weak and really could not even talk about the possibility of an LVAD. A PA catheter was inserted which showed a very low cardiac index of 1.1:  RA = 1 RV = 24/1/2 PA = 26/15 (19) PCW = 6 Fick cardiac output/index = 2.1/1.3 Thermo CO/CI =1.8/1.1 SVR = 2418 PVR = 6.0 WU FA sat = 98% PA sat = 57%, 58%  He had an IABP placed on 12/23 which improved his hemodynamics and allowed vasopressin to be stopped. His PA numbers improved as did his creatinine.  [place]wan #s RA 1 PA 23/9 PCWP 10 Therm CO/CI 3.8/2.3 PVR 1.9 WU SVR 1375 Co-ox 72%  Today he is looking better with CI 2.4 on milrinone and low dose levophed and IABP.   Swan #s  RA 1 PA 26/17 PCWP 14 Therm CO/CI 4.0/2.4 PVR 2.0WU SVR 1575  Creatinine improving 2.0>2.56 > 2.65> 2.39 > 2.08 Co-ox 72%  He lives at home with his wife. He was very functional until a year ago and has slowly gone down hill since. He denies any history of GI bleeding and has never had EGD or colonoscopy. He has had no abnormal bleeding on Eliquis.  Past Medical History  Diagnosis Date  . 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 CKD (chronic kidney disease) stage 3, GFR 30-59 ml/min   . V-tach ([place])   . PAF (paroxysmal atrial fibrillation) ([place])   . CVA (cerebral infarction)   . BPH (benign prostatic hyperplasia)   . HTN (hypertension), benign   . Hyperlipidemia     History reviewed. No pertinent past surgical history.  Family History  Problem Relation Age of Onset  . Hypertension Mother   . CVA Maternal Aunt     Social History:  reports that he has never smoked. He does not have any smokeless tobacco history on file. He reports that he does not drink alcohol or use illicit drugs.  Allergies: No Known Allergies  Medications:  I have reviewed the patient's current  medications. Prior to Admission:  Prescriptions prior to admission  Medication Sig Dispense Refill Last Dose  . atorvastatin (LIPITOR) 40 MG tablet Take 40 mg by mouth daily.  2 09/25/2015 at Unknown time  . bumetanide (BUMEX) 1 MG tablet Take 1 mg by mouth daily.  3 09/05/2015 at Unknown time  . carvedilol (COREG) 12.5 MG tablet Take 12.5 mg by mouth 2 (two) times daily.  2 09/26/2015 at 0800  . digoxin (LANOXIN) 0.125 MG tablet Take 125 mcg by mouth daily.  2 08/28/2015 at Unknown time  . dutasteride (AVODART) 0.5 MG capsule Take 0.5 mg by mouth daily.  3 09/11/2015 at Unknown time  . ELIQUIS 5 MG TABS tablet Take 5 mg by mouth 2 (two) times daily.  2 09/11/2015 at am  . ENTRESTO 49-51 MG Take 1 tablet by mouth 2 (two) times daily.  3 09/10/2015 at am  . tamsulosin (FLOMAX) 0.4 MG CAPS capsule Take 0.4 mg by mouth daily.  3 09/25/2015 at Unknown time   Scheduled: . amiodarone  200 mg Oral Daily  . atorvastatin  40 mg Oral q1800  . hydrALAZINE  25 mg Oral 3 times per day  . insulin aspart  0-5 Units Subcutaneous QHS  . insulin aspart  0-9 Units Subcutaneous TID WC  . piperacillin-tazobactam (ZOSYN)  IV  3.375 g Intravenous 3 times per day  . sodium chloride  10-40 mL Intracatheter Q12H  . sodium chloride  3 mL Intravenous Q12H  . sodium chloride  3 mL Intravenous Q12H  . sodium chloride  3 mL Intravenous Q12H  . [START ON 09/22/2015] vancomycin  1,000 mg Intravenous Q48H   Continuous: . sodium chloride 50 mL/hr at 09/19/15 0651  . heparin 550 Units/hr (09/20/15 1545)  . milrinone 0.25 mcg/kg/min (09/19/15 0000)  . norepinephrine (LEVOPHED) Adult infusion Stopped (09/20/15 1100)  . vasopressin (PITRESSIN) infusion - *FOR SHOCK* Stopped (09/19/15 0730)   GGY:IRSWNI chloride, Place/Maintain arterial line **AND** sodium chloride, sodium chloride, sodium chloride, acetaminophen, acetaminophen, hydrALAZINE, ondansetron (ZOFRAN) IV, ondansetron (ZOFRAN) IV, sodium chloride, sodium  chloride, sodium chloride, sodium chloride Anti-infectives    Start     Dose/Rate Route Frequency Ordered Stop   09/22/15 0600  vancomycin (VANCOCIN) IVPB 750 mg/150 ml premix  Status:  Discontinued     750 mg 150 mL/hr over 60 Minutes Intravenous Every 24 hours 09/20/15 0833 09/20/15 0946   09/22/15 0600  vancomycin (VANCOCIN) IVPB 1000 mg/200 mL premix     1,000 mg 200 mL/hr over 60 Minutes Intravenous Every 48 hours 09/20/15 0946     09/20/15 0600  vancomycin (VANCOCIN) IVPB 1000 mg/200 mL premix  Status:  Discontinued     1,000 mg 200 mL/hr over 60 Minutes Intravenous Every 48 hours 08/27/2015 0608  09/20/15 6384   09/10/2015 0630  vancomycin (VANCOCIN) IVPB 1000 mg/200 mL premix     1,000 mg 200 mL/hr over 60 Minutes Intravenous  Once 08/27/2015 0608 09/06/2015 0748   09/17/15 1600  piperacillin-tazobactam (ZOSYN) IVPB 3.375 g     3.375 g 12.5 mL/hr over 240 Minutes Intravenous 3 times per day 09/17/15 1415     09/17/15 1200  sulfamethoxazole-trimethoprim (BACTRIM,SEPTRA) 400-80 MG per tablet 1 tablet  Status:  Discontinued     1 tablet Oral Every 12 hours 09/17/15 1126 09/17/15 1413      Results for orders placed or performed during the hospital encounter of 09/03/2015 (from the past 48 hour(s))  Glucose, capillary     Status: Abnormal   Collection Time: 09/17/2015  5:09 PM  Result Value Ref Range   Glucose-Capillary 133 (H) 65 - 99 mg/dL   Comment 1 Arterial Specimen   Heparin level (unfractionated)     Status: Abnormal   Collection Time: 09/02/2015 10:15 PM  Result Value Ref Range   Heparin Unfractionated 0.78 (H) 0.30 - 0.70 IU/mL    Comment:        IF HEPARIN RESULTS ARE BELOW EXPECTED VALUES, AND PATIENT DOSAGE HAS BEEN CONFIRMED, SUGGEST FOLLOW UP TESTING OF ANTITHROMBIN III LEVELS.   APTT     Status: Abnormal   Collection Time: 09/11/2015 10:15 PM  Result Value Ref Range   aPTT 116 (H) 24 - 37 seconds    Comment:        IF BASELINE aPTT IS ELEVATED, SUGGEST PATIENT RISK  ASSESSMENT BE USED TO DETERMINE APPROPRIATE ANTICOAGULANT THERAPY.   Glucose, capillary     Status: Abnormal   Collection Time: 09/02/2015 10:15 PM  Result Value Ref Range   Glucose-Capillary 150 (H) 65 - 99 mg/dL  Basic metabolic panel     Status: Abnormal   Collection Time: 09/05/2015 10:30 PM  Result Value Ref Range   Sodium 143 135 - 145 mmol/L    Comment: DELTA CHECK NOTED   Potassium 4.0 3.5 - 5.1 mmol/L   Chloride 113 (H) 101 - 111 mmol/L   CO2 21 (L) 22 - 32 mmol/L   Glucose, Bld 175 (H) 65 - 99 mg/dL   BUN 40 (H) 6 - 20 mg/dL   Creatinine, Ser 2.65 (H) 0.61 - 1.24 mg/dL   Calcium 7.0 (L) 8.9 - 10.3 mg/dL   GFR calc non Af Amer 22 (L) >60 mL/min   GFR calc Af Amer 26 (L) >60 mL/min    Comment: (NOTE) The eGFR has been calculated using the CKD EPI equation. This calculation has not been validated in all clinical situations. eGFR's persistently <60 mL/min signify possible Chronic Kidney Disease.    Anion gap 9 5 - 15  Carboxyhemoglobin     Status: None   Collection Time: 08/28/2015 10:34 PM  Result Value Ref Range   Total hemoglobin 13.7 13.5 - 18.0 g/dL   O2 Saturation 68.2 %   Carboxyhemoglobin 1.1 0.5 - 1.5 %   Methemoglobin 0.9 0.0 - 1.5 %  Basic metabolic panel     Status: Abnormal   Collection Time: 09/19/15  5:00 AM  Result Value Ref Range   Sodium 144 135 - 145 mmol/L   Potassium 4.0 3.5 - 5.1 mmol/L   Chloride 114 (H) 101 - 111 mmol/L   CO2 21 (L) 22 - 32 mmol/L   Glucose, Bld 156 (H) 65 - 99 mg/dL   BUN 37 (H) 6 - 20 mg/dL  Creatinine, Ser 2.39 (H) 0.61 - 1.24 mg/dL   Calcium 7.0 (L) 8.9 - 10.3 mg/dL   GFR calc non Af Amer 25 (L) >60 mL/min   GFR calc Af Amer 29 (L) >60 mL/min    Comment: (NOTE) The eGFR has been calculated using the CKD EPI equation. This calculation has not been validated in all clinical situations. eGFR's persistently <60 mL/min signify possible Chronic Kidney Disease.    Anion gap 9 5 - 15  CBC     Status: Abnormal   Collection  Time: 09/19/15  5:00 AM  Result Value Ref Range   WBC 12.3 (H) 4.0 - 10.5 K/uL   RBC 4.30 4.22 - 5.81 MIL/uL   Hemoglobin 13.4 13.0 - 17.0 g/dL    Comment: DELTA CHECK NOTED REPEATED TO VERIFY    HCT 37.3 (L) 39.0 - 52.0 %   MCV 86.7 78.0 - 100.0 fL   MCH 31.2 26.0 - 34.0 pg   MCHC 35.9 30.0 - 36.0 g/dL   RDW 14.1 11.5 - 15.5 %   Platelets 411 (H) 150 - 400 K/uL  APTT     Status: Abnormal   Collection Time: 09/19/15  5:00 AM  Result Value Ref Range   aPTT 46 (H) 24 - 37 seconds    Comment:        IF BASELINE aPTT IS ELEVATED, SUGGEST PATIENT RISK ASSESSMENT BE USED TO DETERMINE APPROPRIATE ANTICOAGULANT THERAPY.   Carboxyhemoglobin     Status: Abnormal   Collection Time: 09/19/15  5:15 AM  Result Value Ref Range   Total hemoglobin 10.7 (L) 13.5 - 18.0 g/dL   O2 Saturation 71.8 %   Carboxyhemoglobin 1.2 0.5 - 1.5 %   Methemoglobin 1.0 0.0 - 1.5 %  Glucose, capillary     Status: Abnormal   Collection Time: 09/19/15  7:57 AM  Result Value Ref Range   Glucose-Capillary 140 (H) 65 - 99 mg/dL   Comment 1 Arterial Specimen   Heparin level (unfractionated)     Status: Abnormal   Collection Time: 09/19/15  8:15 AM  Result Value Ref Range   Heparin Unfractionated 0.76 (H) 0.30 - 0.70 IU/mL    Comment:        IF HEPARIN RESULTS ARE BELOW EXPECTED VALUES, AND PATIENT DOSAGE HAS BEEN CONFIRMED, SUGGEST FOLLOW UP TESTING OF ANTITHROMBIN III LEVELS.   APTT     Status: Abnormal   Collection Time: 09/19/15  8:15 AM  Result Value Ref Range   aPTT 120 (H) 24 - 37 seconds    Comment:        IF BASELINE aPTT IS ELEVATED, SUGGEST PATIENT RISK ASSESSMENT BE USED TO DETERMINE APPROPRIATE ANTICOAGULANT THERAPY.   Glucose, capillary     Status: Abnormal   Collection Time: 09/19/15 12:30 PM  Result Value Ref Range   Glucose-Capillary 181 (H) 65 - 99 mg/dL   Comment 1 Arterial Specimen   Glucose, capillary     Status: Abnormal   Collection Time: 09/19/15  3:38 PM  Result Value Ref  Range   Glucose-Capillary 164 (H) 65 - 99 mg/dL   Comment 1 Arterial Specimen   APTT     Status: Abnormal   Collection Time: 09/19/15  3:40 PM  Result Value Ref Range   aPTT 79 (H) 24 - 37 seconds    Comment:        IF BASELINE aPTT IS ELEVATED, SUGGEST PATIENT RISK ASSESSMENT BE USED TO DETERMINE APPROPRIATE ANTICOAGULANT THERAPY.   Basic metabolic panel  Status: Abnormal   Collection Time: 09/19/15  6:50 PM  Result Value Ref Range   Sodium 141 135 - 145 mmol/L   Potassium 3.8 3.5 - 5.1 mmol/L   Chloride 114 (H) 101 - 111 mmol/L   CO2 19 (L) 22 - 32 mmol/L   Glucose, Bld 166 (H) 65 - 99 mg/dL   BUN 36 (H) 6 - 20 mg/dL   Creatinine, Ser 2.31 (H) 0.61 - 1.24 mg/dL   Calcium 7.6 (L) 8.9 - 10.3 mg/dL   GFR calc non Af Amer 26 (L) >60 mL/min   GFR calc Af Amer 30 (L) >60 mL/min    Comment: (NOTE) The eGFR has been calculated using the CKD EPI equation. This calculation has not been validated in all clinical situations. eGFR's persistently <60 mL/min signify possible Chronic Kidney Disease.    Anion gap 8 5 - 15  Glucose, capillary     Status: Abnormal   Collection Time: 09/19/15 10:09 PM  Result Value Ref Range   Glucose-Capillary 185 (H) 65 - 99 mg/dL   Comment 1 Venous Specimen   APTT     Status: Abnormal   Collection Time: 09/20/15 12:00 AM  Result Value Ref Range   aPTT 62 (H) 24 - 37 seconds    Comment:        IF BASELINE aPTT IS ELEVATED, SUGGEST PATIENT RISK ASSESSMENT BE USED TO DETERMINE APPROPRIATE ANTICOAGULANT THERAPY.   Heparin level (unfractionated)     Status: None   Collection Time: 09/20/15 12:00 AM  Result Value Ref Range   Heparin Unfractionated 0.30 0.30 - 0.70 IU/mL    Comment:        IF HEPARIN RESULTS ARE BELOW EXPECTED VALUES, AND PATIENT DOSAGE HAS BEEN CONFIRMED, SUGGEST FOLLOW UP TESTING OF ANTITHROMBIN III LEVELS.   Basic metabolic panel     Status: Abnormal   Collection Time: 09/20/15  5:40 AM  Result Value Ref Range   Sodium  143 135 - 145 mmol/L   Potassium 3.6 3.5 - 5.1 mmol/L   Chloride 116 (H) 101 - 111 mmol/L   CO2 20 (L) 22 - 32 mmol/L   Glucose, Bld 163 (H) 65 - 99 mg/dL   BUN 32 (H) 6 - 20 mg/dL   Creatinine, Ser 2.08 (H) 0.61 - 1.24 mg/dL   Calcium 7.6 (L) 8.9 - 10.3 mg/dL   GFR calc non Af Amer 30 (L) >60 mL/min   GFR calc Af Amer 34 (L) >60 mL/min    Comment: (NOTE) The eGFR has been calculated using the CKD EPI equation. This calculation has not been validated in all clinical situations. eGFR's persistently <60 mL/min signify possible Chronic Kidney Disease.    Anion gap 7 5 - 15  CBC     Status: Abnormal   Collection Time: 09/20/15  5:40 AM  Result Value Ref Range   WBC 10.6 (H) 4.0 - 10.5 K/uL   RBC 4.09 (L) 4.22 - 5.81 MIL/uL   Hemoglobin 12.6 (L) 13.0 - 17.0 g/dL   HCT 35.7 (L) 39.0 - 52.0 %   MCV 87.3 78.0 - 100.0 fL   MCH 30.8 26.0 - 34.0 pg   MCHC 35.3 30.0 - 36.0 g/dL   RDW 14.2 11.5 - 15.5 %   Platelets 327 150 - 400 K/uL  Carboxyhemoglobin     Status: Abnormal   Collection Time: 09/20/15  5:45 AM  Result Value Ref Range   Total hemoglobin 12.4 (L) 13.5 - 18.0 g/dL   O2  Saturation 72.1 %   Carboxyhemoglobin 1.5 0.5 - 1.5 %   Methemoglobin 0.8 0.0 - 1.5 %  Glucose, capillary     Status: Abnormal   Collection Time: 09/20/15  7:40 AM  Result Value Ref Range   Glucose-Capillary 165 (H) 65 - 99 mg/dL   Comment 1 Arterial Specimen   APTT     Status: Abnormal   Collection Time: 09/20/15  7:51 AM  Result Value Ref Range   aPTT 80 (H) 24 - 37 seconds    Comment:        IF BASELINE aPTT IS ELEVATED, SUGGEST PATIENT RISK ASSESSMENT BE USED TO DETERMINE APPROPRIATE ANTICOAGULANT THERAPY.   Heparin level (unfractionated)     Status: None   Collection Time: 09/20/15  7:51 AM  Result Value Ref Range   Heparin Unfractionated 0.34 0.30 - 0.70 IU/mL    Comment:        IF HEPARIN RESULTS ARE BELOW EXPECTED VALUES, AND PATIENT DOSAGE HAS BEEN CONFIRMED, SUGGEST FOLLOW UP  TESTING OF ANTITHROMBIN III LEVELS.   Glucose, capillary     Status: Abnormal   Collection Time: 09/20/15 11:57 AM  Result Value Ref Range   Glucose-Capillary 139 (H) 65 - 99 mg/dL   Comment 1 Arterial Specimen   Heparin level (unfractionated)     Status: None   Collection Time: 09/20/15  2:30 PM  Result Value Ref Range   Heparin Unfractionated 0.32 0.30 - 0.70 IU/mL    Comment:        IF HEPARIN RESULTS ARE BELOW EXPECTED VALUES, AND PATIENT DOSAGE HAS BEEN CONFIRMED, SUGGEST FOLLOW UP TESTING OF ANTITHROMBIN III LEVELS.   Lactate dehydrogenase     Status: Abnormal   Collection Time: 09/20/15  2:30 PM  Result Value Ref Range   LDH 524 (H) 98 - 192 U/L  Prealbumin     Status: Abnormal   Collection Time: 09/20/15  2:30 PM  Result Value Ref Range   Prealbumin 9.6 (L) 18 - 38 mg/dL    Dg Chest Port 1 View  09/20/2015  CLINICAL DATA:  Balloon pump placement EXAM: PORTABLE CHEST - 1 VIEW COMPARISON:  09/17/2015 FINDINGS: Placement of a right IJ pulmonary arterial catheter. No pneumothorax. IABP tip projects just below the aortic arch. Right arm PICC and left subclavian AICD stable in position. Heart size upper limits normal. Left retrocardiac consolidation/atelectasis. No new infiltrate. No effusion. Visualized skeletal structures are unremarkable. IMPRESSION: 1. Support hardware placement as above, without pneumothorax or other apparent complication. 2. Persistent left retrocardiac consolidation/atelectasis. Electronically Signed   By: Lucrezia Europe M.D.   On: 09/20/2015 11:42    Review of Systems  Constitutional: Positive for malaise/fatigue. Negative for fever, chills and weight loss.  HENT: Negative.   Eyes: Negative.   Respiratory: Positive for shortness of breath. Negative for cough.   Cardiovascular: Positive for leg swelling. Negative for chest pain, orthopnea and PND.  Gastrointestinal: Negative.   Genitourinary: Negative.   Musculoskeletal: Positive for joint pain.  Skin:  Negative.   Neurological: Negative.   Endo/Heme/Allergies: Negative.   Psychiatric/Behavioral: Negative.    Blood pressure 131/66, pulse 75, temperature 97.3 F (36.3 C), temperature source Core (Comment), resp. rate 24, height _0  (1.702 m), weight 60.3 kg (132 lb 15 oz), SpO2 100 %. Physical Exam  Constitutional: He is oriented to person, place, and time. He appears well-developed and well-nourished. No distress.  HENT:  Head: Normocephalic and atraumatic.  Mouth/Throat: Oropharynx is clear and moist.  Eyes: EOM  are normal. Pupils are equal, round, and reactive to light.  Neck: Normal range of motion. Neck supple. No JVD present. No thyromegaly present.  Old left neck scar  No bruits  Cardiovascular: Normal rate, regular rhythm, normal heart sounds and intact distal pulses.   No murmur heard. Respiratory: Effort normal and breath sounds normal. No respiratory distress. He has no rales.  BiV ICD left anterior chest wall  GI: Soft. Bowel sounds are normal. He exhibits no distension and no mass. There is no tenderness.  Musculoskeletal: Normal range of motion. He exhibits no edema or tenderness.  Lymphadenopathy:    He has no cervical adenopathy.  Neurological: He is alert and oriented to person, place, and time. He has normal strength. No cranial nerve deficit or sensory deficit.  Skin: Skin is warm and dry.  Psychiatric: He has a normal mood and affect.    CLINICAL DATA: Acute on chronic renal failure  EXAM: RENAL / URINARY TRACT ULTRASOUND COMPLETE  COMPARISON: None.  FINDINGS: Right Kidney:  Length: 9.4 cm. 5 mm upper pole cyst. Echogenic renal parenchyma, suggesting medical renal disease. No hydronephrosis.  Left Kidney:  Length: 11.1 cm. Multiple renal cysts, measuring up to 1.3 cm in the lower pole. Echogenic renal parenchyma, suggesting medical renal disease. No hydronephrosis.  Bladder:  Decompressed by indwelling Foley catheter.  Additional  comments: Suspected prostatomegaly indenting the base of the bladder. Bilateral pleural effusions.  IMPRESSION: Echogenic renal parenchyma, suggesting medical renal disease. Bilateral renal cysts, measuring up to 1.3 cm in the left lower pole. No hydronephrosis.  Bladder is decompressed by indwelling Foley catheter. Suspected prostatomegaly indenting the base the bladder.  Bilateral pleural effusions.   Electronically Signed  By: Julian Hy M.D.  On: 09/17/2015 12:32      CLINICAL DATA: Balloon pump placement  EXAM: PORTABLE CHEST - 1 VIEW  COMPARISON: 09/17/2015  FINDINGS: Placement of a right IJ pulmonary arterial catheter. No pneumothorax. IABP tip projects just below the aortic arch.  Right arm PICC and left subclavian AICD stable in position. Heart size upper limits normal. Left retrocardiac consolidation/atelectasis. No new infiltrate. No effusion. Visualized skeletal structures are unremarkable.  IMPRESSION: 1. Support hardware placement as above, without pneumothorax or other apparent complication. 2. Persistent left retrocardiac consolidation/atelectasis.   Electronically Signed  By: Lucrezia Europe M.D.  On: 09/20/2015 11:42  Assessment/Plan:  1. Acute on chronic systolic heart failure due to NICM with EF 5%. I have personally reviewed his echo and the only part of his LV that is contracting is posterobasal. 2. Cardiogenic shock secondary to above. 3. Mild to moderate RV dysfunction by admission echo at [facility] with low CVP.  4. S/P St. Jude CRT-D for history of VT and low EF. 5. 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 PAF on Eliquis 7. BPH s/p urethral dilation and foley insertion this admission 8. Possible septic shock due to UTI, although urine culture negative 9. Stage 3 CKD with acute worsening due to cardiogenic shock. Creat 1.9 at [facility] and previous baseline 1.5.  10. HTN 11. Hyperlipidemia 12. Pulmonary  hypertension  He appears to be a reasonable candidate for LVAD therapy as DT. He is hemodynamically improved after IABP insertion with inotropic support with acceptable CI and Co-ox. His renal function is improving and hopefully will continue to improve back to his baseline. He is INTERMACS 1 with deteriorating hemodynamics despite multiple inotropes but has stabilized with IABP support. His BSA is 1.6 but I think he is adequate size for a  HeartMate 2. We will continue workup for LVAD and work on Biochemist, clinical. He can not go for CT of the chest or 2-view CXR and spirometry will not be helpful. I discussed LVAD therapy with him including the alternative of palliative care. He seems motivated to proceed with LVAD to improve his survival and quality of life.   Gaye Pollack 09/20/2015, 3:58 PM

## 2015-09-20 NOTE — Progress Notes (Signed)
Pt's arterial SBP trending up to 170-190. Line maintenance done. Cuff pressure SBP 130s. IABP augmenting 130-140. Dr. Gala Romney made aware. Telephone orders given for hydralazine 25mg  PO TID starting now. An hour later Arterial SBP 190-200, cuff pressure SBP 150s, and IABP augementing 140-150. MD made aware. Orders given for hydralazine 10mg  IV now with scheduled dose increased to 50mg  TID. Pt's arterial SBP now trending 150-140s. Will continue to monitor and assess pt closely.

## 2015-09-20 NOTE — Progress Notes (Addendum)
ANTICOAGULATION CONSULT NOTE -  Follow Up Consult  Pharmacy Consult for Heparin and Vancomycin Indication: atrial fibrillation and ?septic shock/?UTI  No Known Allergies  Patient Measurements: Height: 5\' 7"  (170.2 cm) Weight: 132 lb 15 oz (60.3 kg) IBW/kg (Calculated) : 66.1 Heparin Dosing Weight: 53.4kg  Labs:  Recent Labs  Oct 13, 2015 0533  09/19/15 0500 09/19/15 0815 09/19/15 1540 09/19/15 1850 09/20/15 09/20/15 0540 09/20/15 0751  HGB 18.3*  --  13.4  --   --   --   --  12.6*  --   HCT 51.4  --  37.3*  --   --   --   --  35.7*  --   PLT 625*  --  411*  --   --   --   --  327  --   APTT  --   < > 46* 120* 79*  --  62*  --  80*  HEPARINUNFRC  --   < >  --  0.76*  --   --  0.30  --  0.34  CREATININE 2.56*  < > 2.39*  --   --  2.31*  --  2.08*  --   < > = values in this interval not displayed.   Assessment: Patient is 74 yo with h/o PAF, CVA, HTN, heart falilure. Has been on Eliquis PTA and started on IV heparin as inpatient. Heparin stopped 12/22 d/t hematuria.  He was taken to cath lab for IABP placement 12/23 and resumed heparin after. Hematuria resolved. Will use lower therapeutic goal given recent bleeding. CBC stable.  Heparin at 500 units/hour (increased from 350 units.hour) now therapeutic this am with HL 0.34, aPTT 80. No s/sx of bleeding noted.  Also on vanc/zosyn for possible UTI, WBC 10.6 improved with ABX, afebrile, renal function improved and stable. Next dose of vanc due 12/27.  Goal of Therapy:  Heparin level 0.3-0.5 APTT 66-90 sec Monitor platelets by anticoagulation protocol: Yes  Vancomycin trough 15-20   Plan:  Continue Heparin drip at 500 units/hr Continue Vancomycin 1g Q48H 1400 confirmatory APTT/HL Daily aPTT and HL, CBC   Sherron Monday, PharmD Clinical Pharmacy Resident Pager: [phone] 09/20/2015 8:45 AM

## 2015-09-20 NOTE — Progress Notes (Signed)
VASCULAR LAB PRELIMINARY  PRELIMINARY  PRELIMINARY  PRELIMINARY  Pre-op Cardiac Surgery  Carotid Findings:  Bilateral:  1-39% ICA stenosis.  Vertebral artery flow is antegrade. Difficult study on the right due to [facility] catheter and dressing    Lower  Extremity Right Left  Dorsalis Pedis 104 Biphasic 103 Triphasic  Posterior Tibial 92 Triphasic 122 Triphasic  Ankle/Brachial Indices 0.79 0.93    Findings:  ABIs indicate a moderate reduction in arterial flow on the right and a normal to mild reduction on the left. Doppler waveforms were difficult to evaluate due to the Balloon pump   Shrika Milos, RVS 09/20/2015, 8:22 PM

## 2015-09-20 NOTE — Progress Notes (Signed)
VASCULAR LAB PRELIMINARY  PRELIMINARY  PRELIMINARY  PRELIMINARY  Bilateral lower extremity venous duplex completed.    Preliminary report:  Bilateral:  No evidence of DVT, superficial thrombosis, or Baker's Cyst.   Brad [name], RVS 09/20/2015, 4:38 PM

## 2015-09-20 NOTE — Progress Notes (Signed)
Advanced Heart Failure Rounding Note   Subjective:    Admitted from [facility] (Brad [name]) with cardiogenic shock for advanced heart failure.   On 12/22 foley placed by Urology requiring urethral dilation -> UTI . ? Septic shock. On vanco/zosyn   IABP placed 12/23. Now on milrinone and levophed (only 2 mcg). BiV pacing rate turned up to 75 yesterday.   Continues to feel better. Renal function improving. SBP 120-160  Met VAD patient yesterday and now wants to proceed with VAD if approved by insurance   Swan #s RA 1 PA 26/17 PCWP 14 Therm CO/CI 4.0/2.4 PVR 2.0WU SVR 1575  Creatinine improving 2.0>2.56 > 2.65> 2.39 > 2.08 Co-ox 72%   Objective:   Weight Range:  Vital Signs:   Temp:  [96.4 F (35.8 C)-98.5 F (36.9 C)] 97.7 F (36.5 C) (12/25 0800) Pulse Rate:  [35-151] 74 (12/25 0800) Resp:  [14-25] 20 (12/25 0800) SpO2:  [99 %-100 %] 100 % (12/25 0800) Arterial Line BP: (92-162)/(28-48) 138/41 mmHg (12/25 0800) Weight:  [60.3 kg (132 lb 15 oz)] 60.3 kg (132 lb 15 oz) (12/25 0530) Last BM Date: 09/19/15  Weight change: Filed Weights   09/12/2015 0500 09/19/15 0500 09/20/15 0530  Weight: 55.6 kg (122 lb 9.2 oz) 57.9 kg (127 lb 10.3 oz) 60.3 kg (132 lb 15 oz)    Intake/Output:   Intake/Output Summary (Last 24 hours) at 09/20/15 0842 Last data filed at 09/20/15 0800  Gross per 24 hour  Intake 2849.4 ml  Output   1570 ml  Net 1279.4 ml     Physical Exam: CVP 1 General: Awake  In bed.  No resp difficulty HEENT: normal Neck: supple. RIJ swan. Carotids 2+ bilat; no bruits. No lymphadenopathy or thryomegaly appreciated. Cor: PMI nondisplaced. Regular rate & rhythm. No rubs, or murmurs. +S3  Lungs: clear Abdomen: soft, nontender, nondistended. No hepatosplenomegaly. No bruits or masses. Good bowel sounds. Extremities: no cyanosis, clubbing, rash, edema RUE PICC  LUE radial arterial line. R groin IABP Neuro: alert & orientedx3, cranial nerves grossly intact. moves all 4  extremities w/o difficulty. Affect pleasant GU: Foley - clear urine   Telemetry: AV paced 75  Labs: Basic Metabolic Panel:  Recent Labs Lab 09/25/2015 0533 09/08/2015 2230 09/19/15 0500 09/19/15 1850 09/20/15 0540  NA 131* 143 144 141 143  K 3.7 4.0 4.0 3.8 3.6  CL 103 113* 114* 114* 116*  CO2 21* 21* 21* 19* 20*  GLUCOSE 447* 175* 156* 166* 163*  BUN 38* 40* 37* 36* 32*  CREATININE 2.56* 2.65* 2.39* 2.31* 2.08*  CALCIUM 6.9* 7.0* 7.0* 7.6* 7.6*    Liver Function Tests:  Recent Labs Lab 09/15/2015 2017  AST 36  ALT 31  ALKPHOS 117  BILITOT 1.5*  PROT 5.7*  ALBUMIN 2.5*   No results for input(s): LIPASE, AMYLASE in the last 168 hours. No results for input(s): AMMONIA in the last 168 hours.  CBC:  Recent Labs Lab 09/13/2015 1802 09/17/15 0357 09/02/2015 0533 09/19/15 0500 09/20/15 0540  WBC 8.0 10.6* 15.1* 12.3* 10.6*  HGB 14.6 15.5 18.3* 13.4 12.6*  HCT 43.4 46.6 51.4 37.3* 35.7*  MCV 90.8 90.5 88.3 86.7 87.3  PLT 502* 513* 625* 411* 327    Cardiac Enzymes: No results for input(s): CKTOTAL, CKMB, CKMBINDEX, TROPONINI in the last 168 hours.  BNP: BNP (last 3 results)  Recent Labs  09/15/2015 1803  BNP >4500.0*    ProBNP (last 3 results) No results for input(s): PROBNP in the last  8760 hours.    Other results:  Imaging: No results found.   Medications:     Scheduled Medications: . amiodarone  200 mg Oral Daily  . atorvastatin  40 mg Oral q1800  . insulin aspart  0-5 Units Subcutaneous QHS  . insulin aspart  0-9 Units Subcutaneous TID WC  . piperacillin-tazobactam (ZOSYN)  IV  3.375 g Intravenous 3 times per day  . sodium chloride  10-40 mL Intracatheter Q12H  . sodium chloride  3 mL Intravenous Q12H  . sodium chloride  3 mL Intravenous Q12H  . sodium chloride  3 mL Intravenous Q12H  . [START ON 09/22/2015] vancomycin  750 mg Intravenous Q24H    Infusions: . sodium chloride 50 mL/hr at 09/19/15 0651  . heparin 500 Units/hr (09/20/15  0114)  . milrinone 0.25 mcg/kg/min (09/19/15 0000)  . norepinephrine (LEVOPHED) Adult infusion 5 mcg/min (09/20/15 0654)  . vasopressin (PITRESSIN) infusion - *FOR SHOCK* Stopped (09/19/15 0730)    PRN Medications: sodium chloride, Place/Maintain arterial line **AND** sodium chloride, sodium chloride, sodium chloride, acetaminophen, acetaminophen, ondansetron (ZOFRAN) IV, ondansetron (ZOFRAN) IV, sodium chloride, sodium chloride, sodium chloride, sodium chloride   Assessment:   1. Cardiogenic/septicshock    --IABP placed 12/23 2. 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 RV mild to moderate HK  --s/p St. Jude CRT-D 3. PAF on Eiiquis  4. 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 (previous baseline ~1.5) 6. HTN 7. Hyperlipidemia 8. Pulmonary HTN 9. Urinary Retention s/p urethral dilation - hematurai resolved.   Plan/Discussion:    Continues to improve with IABP and inotropes. Luiz Blare numbers done personally at bedside and cardiac output up. Filling pressures look good off lasix. PVR and SVR much improved. Renal function improving with MCS. Continue IABP, milrinone, levophed. Heparin for IABP. Needs CXR today  Continue broad spectrum abx for urosepsis s/p urethral dilation. Hematuria now resolved.    BiV pacing rate has been turned up and BP has responded well. Remains on amio for PAF. Now in NSR.   Renal function now improving. Hopefully will continue to improve enough to make VAD possible.  Will hold diuretics for now.   Long talk about possibility of durable VAD for DT again today. He met a VAD patient yesterday and both he and his wife said that helped a lot. He would now like to proceed with VAD if insurance approves.  The patient is critically ill with multiple organ systems failure and requires high complexity decision making for assessment and support, frequent  evaluation and titration of therapies, application of advanced monitoring technologies and extensive interpretation of multiple databases.   Critical Care Time devoted to patient care services described in this note is 45 Minutes.  Sharleen Szczesny,MD 8:42 AM Advanced Heart Failure Team Pager [phone] (M-F; 7a - 4p)  Please contact [facility] for night-coverage after hours (4p -7a ) and weekends on [email or web address]

## 2015-09-21 ENCOUNTER — Inpatient Hospital Stay (HOSPITAL_COMMUNITY): Payer: Medicare Other

## 2015-09-21 DIAGNOSIS — I509 Heart failure, unspecified: Secondary | ICD-10-CM

## 2015-09-21 LAB — CBC
HEMATOCRIT: 34.4 % — AB (ref 39.0–52.0)
HEMOGLOBIN: 11.9 g/dL — AB (ref 13.0–17.0)
MCH: 29.9 pg (ref 26.0–34.0)
MCHC: 34.6 g/dL (ref 30.0–36.0)
MCV: 86.4 fL (ref 78.0–100.0)
Platelets: 232 10*3/uL (ref 150–400)
RBC: 3.98 MIL/uL — ABNORMAL LOW (ref 4.22–5.81)
RDW: 14.1 % (ref 11.5–15.5)
WBC: 9.3 10*3/uL (ref 4.0–10.5)

## 2015-09-21 LAB — BASIC METABOLIC PANEL
Anion gap: 6 (ref 5–15)
BUN: 23 mg/dL — AB (ref 6–20)
CALCIUM: 7.6 mg/dL — AB (ref 8.9–10.3)
CO2: 21 mmol/L — ABNORMAL LOW (ref 22–32)
CREATININE: 1.93 mg/dL — AB (ref 0.61–1.24)
Chloride: 118 mmol/L — ABNORMAL HIGH (ref 101–111)
GFR calc Af Amer: 38 mL/min — ABNORMAL LOW (ref >60)
GFR calc non Af Amer: 33 mL/min — ABNORMAL LOW (ref >60)
Glucose, Bld: 123 mg/dL — ABNORMAL HIGH (ref 65–99)
POTASSIUM: 3.4 mmol/L — AB (ref 3.5–5.1)
SODIUM: 145 mmol/L (ref 135–145)

## 2015-09-21 LAB — GLUCOSE, CAPILLARY
GLUCOSE-CAPILLARY: 139 mg/dL — AB (ref 65–99)
GLUCOSE-CAPILLARY: 140 mg/dL — AB (ref 65–99)
Glucose-Capillary: 103 mg/dL — ABNORMAL HIGH (ref 65–99)
Glucose-Capillary: 142 mg/dL — ABNORMAL HIGH (ref 65–99)
Glucose-Capillary: 138 mg/dL — ABNORMAL HIGH (ref 65–99)

## 2015-09-21 LAB — HEPARIN LEVEL (UNFRACTIONATED): Heparin Unfractionated: 0.51 IU/mL (ref 0.30–0.70)

## 2015-09-21 LAB — SURGICAL PCR SCREEN
MRSA, PCR: NEGATIVE
Staphylococcus aureus: NEGATIVE

## 2015-09-21 LAB — CARBOXYHEMOGLOBIN
Carboxyhemoglobin: 1.7 % — ABNORMAL HIGH (ref 0.5–1.5)
METHEMOGLOBIN: 0.9 % (ref 0.0–1.5)
O2 Saturation: 79.8 %
Total hemoglobin: 12.2 g/dL — ABNORMAL LOW (ref 13.5–18.0)

## 2015-09-21 LAB — MAGNESIUM: MAGNESIUM: 2 mg/dL (ref 1.7–2.4)

## 2015-09-21 LAB — PSA: PSA: 18.5 ng/mL — ABNORMAL HIGH (ref 0.00–4.00)

## 2015-09-21 MED ORDER — POTASSIUM CHLORIDE CRYS ER 20 MEQ PO TBCR
40.0000 meq | EXTENDED_RELEASE_TABLET | Freq: Once | ORAL | Status: AC
  Administered 2015-09-21: 40 meq via ORAL
  Filled 2015-09-21: qty 2

## 2015-09-21 MED ORDER — SILDENAFIL CITRATE 20 MG PO TABS
20.0000 mg | ORAL_TABLET | Freq: Three times a day (TID) | ORAL | Status: DC
  Administered 2015-09-21 – 2015-09-22 (×5): 20 mg via ORAL
  Filled 2015-09-21 (×7): qty 1

## 2015-09-21 MED ORDER — VANCOMYCIN HCL IN DEXTROSE 1-5 GM/200ML-% IV SOLN
1000.0000 mg | INTRAVENOUS | Status: DC
  Administered 2015-09-21 – 2015-09-23 (×3): 1000 mg via INTRAVENOUS
  Filled 2015-09-21 (×3): qty 200

## 2015-09-21 NOTE — Progress Notes (Addendum)
D/W Dr. Merlyn Albert like for patient to have CT scans done. He is more stable on IABP support and weaned off levophed for now. Called [facility] in [place] and medical records is closed today. Will call first thing in the AM to determine if they have any existing scans they can overnight to [place]. Orders entered to have Chest/Abdomen/Pelvis CT w/o contrast.   D/W Nursing staff to inform of plan to transport off unit on IABP support for scans. If unable to get done today I can assist tomorrow afternoon since the transport of the patient will be more complicated with IABP.   Also verified with patient that Medicare A/B is primary insurance plan with [employer] as supplemental.   Rexene Alberts, RN VAD Coordinator   Office: [phone] 24/7 VAD Pager: [phone]

## 2015-09-21 NOTE — Progress Notes (Signed)
Pharmacy Antibiotic Follow-up Note  Brad [name] is a 74 y.o. year-old male admitted on 09/15/2015.  The patient is currently on day 5 of abx for urosepsis.  Assessment/Plan: Scr 1.93, CrCl ~30 ml/min, WBC 9.3 (improved). Pt CrCl has improved since Vancomycin originally started. Plan to increase dose to Vancomycin 1G Q24h. Will continue to follow renal function, culture results, LOT, and antibiotic de-escalation plans   Temp (24hrs), Avg:98.1 F (36.7 C), Min:97.3 F (36.3 C), Max:99 F (37.2 C)   Recent Labs Lab 09/17/15 0357 09/11/2015 0533 09/19/15 0500 09/20/15 0540 09/21/15 0544  WBC 10.6* 15.1* 12.3* 10.6* 9.3    Recent Labs Lab 09/18/15 2230 09/19/15 0500 09/19/15 1850 09/20/15 0540 09/21/15 0544  CREATININE 2.65* 2.39* 2.31* 2.08* 1.93*   Estimated Creatinine Clearance: 30 mL/min (by C-G formula based on Cr of 1.93).    No Known Allergies  Antimicrobials this admission: 12/22 PO Septra x 1 dose, then decompensated so broaden to zosyn  12/22 Zosyn EI>>  12/23 vanc >   Levels/dose changes this admission: Vanco 1G Q48>> 12/26 Vanco 1G Q 24  Microbiology results: 12/22 Bcx ngtd  12/22 Ucx insignif growth final  Thank you for allowing pharmacy to be a part of this patient's care.  Remi Haggard, PharmD Clinical Pharmacist- Resident Pager: [phone]  Remi Haggard PharmD 09/21/2015 2:46 PM

## 2015-09-21 NOTE — Progress Notes (Signed)
Initial Nutrition Assessment  DOCUMENTATION CODES:   Not applicable  INTERVENTION:   Ensure Enlive po BID, each supplement provides 350 kcal and 20 grams of protein  NUTRITION DIAGNOSIS:   Increased nutrient needs related to chronic illness as evidenced by estimated needs  GOAL:   Patient will meet greater than or equal to 90% of their needs  MONITOR:   PO intake, Supplement acceptance, Labs, Weight trends, I & O's  REASON FOR ASSESSMENT:   Consult Assessment of nutrition requirement/status  ASSESSMENT:   74 y/o Male with h/o PAF, previous CVA, HTN, HL, V. 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 Jude CTR-D (initial ICD 2007 with upgrade to CRT in 2015) referred by Dr. Bernarda Caffey for further management of cardiogenic shock and consideration of advanced therapies.    RD consulted for LVAD evaluation.  RD spoke with patient at bedside.  Wife present.  Pt reports a good appetite.  Ate breakfast this AM.  PO intake 75% per flowsheet records.  Denies recent weight loss.  Amenable to Ensure Enlive supplements.    RD unable to complete Nutrition Focused Physical Exam at this time.  Pt with large blanket over body and was cold.  Diet Order:  Diet 2 gram sodium Room service appropriate?: Yes; Fluid consistency:: Thin  Skin:  Reviewed, no issues  Last BM:  12/26  Height:   Ht Readings from Last 1 Encounters:  09/21/2015 5\' 7"  (1.702 m)    Weight:   Wt Readings from Last 1 Encounters:  09/21/15 139 lb 5.3 oz (63.2 kg)    Ideal Body Weight:  67 kg  BMI:  Body mass index is 21.82 kg/(m^2).  Estimated Nutritional Needs:   Kcal:  1900-2100  Protein:  100-110 gm  Fluid:  per MD  EDUCATION NEEDS:   No education needs identified at this time  Maureen Chatters, RD, LDN Pager #: [phone] After-Hours Pager #: [phone]

## 2015-09-21 NOTE — Progress Notes (Signed)
  Echocardiogram 2D Echocardiogram has been performed.  Janalyn Harder 09/21/2015, 1:23 PM

## 2015-09-21 NOTE — Progress Notes (Signed)
I was unable to get the balloon to wedge on the Swan-ganz catheter.

## 2015-09-21 NOTE — Progress Notes (Signed)
  Subjective:  No complaints   Objective:  Remains on milrinone and IABP. Has required hydralazine for hypertension.  Co-ox 79%, CI 3.6  Vital signs in last 24 hours: Temp:  [97.3 F (36.3 C)-99 F (37.2 C)] 98.2 F (36.8 C) (12/26 1500) Pulse Rate:  [74-85] 80 (12/26 1500) Cardiac Rhythm:  [-] A-V Sequential paced (12/26 1200) Resp:  [13-27] 27 (12/26 1500) BP: (131-202)/(40-61) 202/56 mmHg (12/26 1405) SpO2:  [92 %-100 %] 96 % (12/26 1500) Arterial Line BP: (116-200)/(35-58) 163/46 mmHg (12/26 1500) Weight:  [63.2 kg (139 lb 5.3 oz)] 63.2 kg (139 lb 5.3 oz) (12/26 0500)  Hemodynamic parameters for last 24 hours: PAP: (32-53)/(15-25) 53/24 mmHg CVP:  [0 mmHg-6 mmHg] 5 mmHg PCWP:  [7 mmHg-22 mmHg] 7 mmHg CO:  [4.3 L/min-5.9 L/min] 5.9 L/min CI:  [2.6 L/min/m2-3.6 L/min/m2] 3.6 L/min/m2  Intake/Output from previous day: 12/25 0701 - 12/26 0700 In: 2623.8 [P.O.:600; I.V.:1873.8; IV Piggyback:150] Out: 2500 [Urine:2500] Intake/Output this shift: Total I/O In: 1061.2 [P.O.:350; I.V.:661.2; IV Piggyback:50] Out: 660 [Urine:660]  General appearance: alert and cooperative Neurologic: intact Heart: regular rate and rhythm Lungs: clear to auscultation bilaterally Extremities: edema mild  Lab Results:  Recent Labs  09/20/15 0540 09/21/15 0544  WBC 10.6* 9.3  HGB 12.6* 11.9*  HCT 35.7* 34.4*  PLT 327 232   BMET:  Recent Labs  09/20/15 0540 09/21/15 0544  NA 143 145  K 3.6 3.4*  CL 116* 118*  CO2 20* 21*  GLUCOSE 163* 123*  BUN 32* 23*  CREATININE 2.08* 1.93*  CALCIUM 7.6* 7.6*    PT/INR: No results for input(s): LABPROT, INR in the last 72 hours. ABG    Component Value Date/Time   PHART 7.470* 09/19/2015 1307   HCO3 15.9* 09/13/2015 1307   TCO2 17 09/12/2015 1307   ACIDBASEDEF 5.0* 09/10/2015 1307   O2SAT 79.8 09/21/2015 0545   CBG (last 3)   Recent Labs  09/20/15 2215 09/21/15 0742 09/21/15 1112  GLUCAP 139* 103* 142*     Assessment/Plan:  1. Acute on chronic systolic heart failure due to NICM with EF 5%. I have personally reviewed his echo and the only part of his LV that is contracting is posterobasal. 2. Cardiogenic shock secondary to above. He has been much more stable over the past few days. Evaluating for LVAD as DT. He is scheduled for CT of chest, abdomen and pelvis tomorrow per PVT. No contrast. 3. Mild to moderate RV dysfunction by admission echo at [facility] with low CVP.  4. S/P St. Jude CRT-D for history of VT and low EF. 5. 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 PAF on Eliquis 7. BPH s/p urethral dilation and foley insertion this admission 8. Possible septic shock due to UTI, although urine culture negative 9. Stage 3 CKD with acute worsening due to cardiogenic shock. Creat 1.9 at [facility] and previous baseline 1.5. His creat continues to improve and is 1.93 today with good urine output. 10. HTN 11. Hyperlipidemia 12. Pulmonary hypertension  LOS: 5 days    Alleen Borne 09/21/2015

## 2015-09-21 NOTE — Progress Notes (Addendum)
Advanced Heart Failure Rounding Note   Subjective:    Admitted from [facility] with cardiogenic shock for advanced heart failure.   On 12/22 foley placed by Urology requiring urethral dilation -> UTI . ? Septic shock. On vanco/zosyn   IABP placed 12/23. Now on milrinone and intermittent levophed. BiV pacing rate turned up to 75.   Continues to feel better. Renal function improving. SBP actually high and was started on hydralazine. Feels much better and now wants to proceed with VAD. Creatinine continue to improve.   Swan #s RA 3 PA 42/19 (26) PCWP 6 Therm CO/CI 4.9/3.0 PVR 4.0WU SVR 1300  Creatinine improving 2.0>2.56 > 2.65> 2.39 > 2.08> 1.93 Co-ox 79%   Objective:   Weight Range:  Vital Signs:   Temp:  [97.2 F (36.2 C)-99 F (37.2 C)] 98.2 F (36.8 C) (12/26 0900) Pulse Rate:  [73-78] 78 (12/26 0900) Resp:  [13-29] 20 (12/26 0900) BP: (131-155)/(40-66) 144/43 mmHg (12/26 0800) SpO2:  [92 %-100 %] 95 % (12/26 0900) Arterial Line BP: (116-200)/(32-52) 150/52 mmHg (12/26 0900) Weight:  [63.2 kg (139 lb 5.3 oz)] 63.2 kg (139 lb 5.3 oz) (12/26 0500) Last BM Date: 09/21/15  Weight change: Filed Weights   09/19/15 0500 09/20/15 0530 09/21/15 0500  Weight: 57.9 kg (127 lb 10.3 oz) 60.3 kg (132 lb 15 oz) 63.2 kg (139 lb 5.3 oz)    Intake/Output:   Intake/Output Summary (Last 24 hours) at 09/21/15 0941 Last data filed at 09/21/15 0907  Gross per 24 hour  Intake 2384.76 ml  Output   2440 ml  Net -55.24 ml     Physical Exam: General: Awake  In bed.  No resp difficulty HEENT: normal Neck: supple. RIJ swan. Carotids 2+ bilat; no bruits. No lymphadenopathy or thryomegaly appreciated. Cor: PMI nondisplaced. Regular rate & rhythm. No rubs, or murmurs. +S3  Lungs: clear Abdomen: soft, nontender, nondistended. No hepatosplenomegaly. No bruits or masses. Good bowel sounds. Extremities: no cyanosis, clubbing, rash, edema RUE PICC  LUE radial arterial line. R  groin IABP Neuro: alert & orientedx3, cranial nerves grossly intact. moves all 4 extremities w/o difficulty. Affect pleasant GU: Foley - clear urine   Telemetry: AV paced 75  Labs: Basic Metabolic Panel:  Recent Labs Lab 09/06/2015 2230 09/19/15 0500 09/19/15 1850 09/20/15 0540 09/21/15 0544  NA 143 144 141 143 145  K 4.0 4.0 3.8 3.6 3.4*  CL 113* 114* 114* 116* 118*  CO2 21* 21* 19* 20* 21*  GLUCOSE 175* 156* 166* 163* 123*  BUN 40* 37* 36* 32* 23*  CREATININE 2.65* 2.39* 2.31* 2.08* 1.93*  CALCIUM 7.0* 7.0* 7.6* 7.6* 7.6*    Liver Function Tests:  Recent Labs Lab 09/06/2015 2017  AST 36  ALT 31  ALKPHOS 117  BILITOT 1.5*  PROT 5.7*  ALBUMIN 2.5*   No results for input(s): LIPASE, AMYLASE in the last 168 hours. No results for input(s): AMMONIA in the last 168 hours.  CBC:  Recent Labs Lab 09/17/15 0357 09/03/2015 0533 09/19/15 0500 09/20/15 0540 09/21/15 0544  WBC 10.6* 15.1* 12.3* 10.6* 9.3  HGB 15.5 18.3* 13.4 12.6* 11.9*  HCT 46.6 51.4 37.3* 35.7* 34.4*  MCV 90.5 88.3 86.7 87.3 86.4  PLT 513* 625* 411* 327 232    Cardiac Enzymes: No results for input(s): CKTOTAL, CKMB, CKMBINDEX, TROPONINI in the last 168 hours.  BNP: BNP (last 3 results)  Recent Labs  09/22/2015 1803  BNP >4500.0*    ProBNP (last 3 results) No  results for input(s): PROBNP in the last 8760 hours.    Other results:  Imaging: Dg Chest Port 1 View  09/20/2015  CLINICAL DATA:  Balloon pump placement EXAM: PORTABLE CHEST - 1 VIEW COMPARISON:  09/17/2015 FINDINGS: Placement of a right IJ pulmonary arterial catheter. No pneumothorax. IABP tip projects just below the aortic arch. Right arm PICC and left subclavian AICD stable in position. Heart size upper limits normal. Left retrocardiac consolidation/atelectasis. No new infiltrate. No effusion. Visualized skeletal structures are unremarkable. IMPRESSION: 1. Support hardware placement as above, without pneumothorax or other apparent  complication. 2. Persistent left retrocardiac consolidation/atelectasis. Electronically Signed   By: Corlis Leak M.D.   On: 09/20/2015 11:42     Medications:     Scheduled Medications: . amiodarone  200 mg Oral Daily  . atorvastatin  40 mg Oral q1800  . hydrALAZINE  50 mg Oral 3 times per day  . insulin aspart  0-5 Units Subcutaneous QHS  . insulin aspart  0-9 Units Subcutaneous TID WC  . piperacillin-tazobactam (ZOSYN)  IV  3.375 g Intravenous 3 times per day  . sodium chloride  10-40 mL Intracatheter Q12H  . sodium chloride  3 mL Intravenous Q12H  . sodium chloride  3 mL Intravenous Q12H  . sodium chloride  3 mL Intravenous Q12H  . [START ON 09/22/2015] vancomycin  1,000 mg Intravenous Q48H    Infusions: . sodium chloride 50 mL/hr at 09/21/15 0800  . heparin 500 Units/hr (09/21/15 0924)  . milrinone 0.25 mcg/kg/min (09/21/15 0800)  . norepinephrine (LEVOPHED) Adult infusion Stopped (09/20/15 1100)  . vasopressin (PITRESSIN) infusion - *FOR SHOCK* Stopped (09/19/15 0730)    PRN Medications: sodium chloride, Place/Maintain arterial line **AND** sodium chloride, sodium chloride, sodium chloride, acetaminophen, acetaminophen, hydrALAZINE, ondansetron (ZOFRAN) IV, ondansetron (ZOFRAN) IV, sodium chloride, sodium chloride, sodium chloride, sodium chloride   Assessment:   1. Cardiogenic/septicshock    --IABP placed 12/23 2. 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 RV mild to moderate HK  --s/p St. Jude CRT-D 3. PAF on Eiiquis  4. 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 [place] (previous baseline ~1.5) 6. HTN 7. Hyperlipidemia 8. Pulmonary HTN 9. Urinary Retention s/p urethral dilation - hematurai resolved.   Plan/Discussion:    Continues to improve with IABP and inotropes. Ernestine Conrad numbers done personally at bedside with nurse and cardiac output now in normal range  with IABP and pressors. Actually having to use hydralazine for HTN. Can switch to nipride if needed. Filling pressures ok. PVR ~4.0. May need to add back sildenafil. Continue to hold lasix.  Renal function improving with MCS. Continue IABP, milrinone +/- levophed. Heparin for IABP. CXR reviewed personally IABP and swan ok. Will leave swan in at least one more day. He has been seen by Dr. Laneta Simmers and planning VAD this week.   Continue broad spectrum abx for urosepsis s/p urethral dilation. Hematuria now resolved.    BiV pacing rate has been turned up and BP has responded well. Remains on amio for PAF. Now in NSR.   Renal function now improving. Hopefully will continue to improve.  Having more frequent PVCs. Check mag. Supp K+  The patient is critically ill with multiple organ systems failure and requires high complexity decision making for assessment and support, frequent evaluation and titration of therapies, application of advanced monitoring technologies and extensive interpretation of multiple databases.  Critical Care Time devoted to patient care services described in this note is 45 Minutes.  Aicha Clingenpeel,MD 9:41 AM Advanced Heart Failure Team Pager [phone] (M-F; 7a - 4p)  Please contact [facility] for night-coverage after hours (4p -7a ) and weekends on [email or web address]

## 2015-09-21 NOTE — Progress Notes (Signed)
LVAD COORDINATOR MCS EDUCATION NOTE:   VAD evaluation consent reviewed and signed by [name] and designated caregiver Brad [name] (wife).  Initial VAD teaching completed with pt and caregiver.   VAD educational packet including "HM II Patient Handbook", "HM II Left Ventricular Assist System" packet, and "[place] HM II Patient Education" reviewed in detail with me and left at bedside for continued reference.  All questions answered regarding VAD implant, hospital stay, and what to expect when discharged home living with a heart pump. Pt identified his wife Brad [name] as his primary caregiver and their son (whom lives at home with them) as back-up.  Explained need for 24/7 care when pt is  discharged home due to sternal precautions (no driving at least 6 weeks, no lifting > 10lbs or pulling/pushing), adaptation to living on support, emotional support, consistent and meticulous exit site care and management, medication adherence and high volume of follow up visits with the VAD Clinic after discharge;  both pt and caregiver verbalized understanding of above.   Explained that LVAD can be implanted for two indications in the setting of advanced left ventricular heart failure treatment:  1. Bridge to transplant - used for patients who cannot safely wait for heart transplant without this device.  Or   2. Destination therapy - used for patients until end of life or recovery of heart function.  Patient and caregiver(s) acknowledge that the indication at this point in time for LVAD therapy would be for Destination Therapy due to age.   Provided brief equipment overview and demonstration with HeartMateII training loop including discussion on the following:  a) power module  b) system controller  c) universal Magazine features editor  d) battery clips  e) Batteries  f) Perc lock  g) Percutaneous lead   Demonstrated and discussed:  a) changing power source on system  controller from tethered (power module) to untethered (battery) mode  b) changing power source on system controller from untethered (battery) to tethered (power module) mode  c) how to monitor battery life both on the system controller and on each individual battery d) changing batteries   Reviewed and supplied a copy of home inspection check list stressing that only three pronged grounded power outlets can be used for VAD equipment. Both he and his wife confirmed their home has electrical outlets that will support the equipment.   Identified the following lifestyle modifications while living on MCS:  1. No driving for at least three months and then only if doctor gives permission to do so.  2. No tub baths while pump implanted, and shower only when doctor gives permission.  3. No swimming or submersion in water while implanted with pump.  4. No contact sports or engaging in jumping activities.  5. Always have a backup controller, charged spare batteries, and battery clips nearby at all times in case of emergency.  6. Call the doctor or hospital contact person if any change in how the pump sounds, feels, or works.  7. Plan to sleep only when connected to the power module.  8. Do not sleep on your stomach.  9. Keep a backup system controller, charged batteries, battery clips, and flashlight near you during sleep in case of electrical power outage.  10. Exit site care including dressing changes, monitoring for infection, and importance of keeping percutaneous lead stabilized at all times.    One of our current patients visited with them Saturday to answer questions about living on and caring for someone on  MCS to assist with decision making.   Reviewed pictures of VAD drive line, site care, dressing changes, and drive line stabilization including securement attachment device and abdominal binder. Discussed with pt and family that they will be required to purchase dressing supplies  as long as patient has the VAD in place.   The patient understands that from this discussion it does not mean that they will receive the device, but that depends on an extensive evaluation process. The patient is aware of the fact that if at anytime they want to stop the evaluation process they can.  All questions have been answered at this time and contact information was provided should they encounter any further questions.  They are both agreeable at this time to the evaluation process and will move forward.   Major concern is commute time (no other center between their home and [facility]; [name] is about the same distance) as well as financial burden of the procedure. They asked great questions during the encounter and felt much better at the end of the conversation.   INTERMACs pre-implant QOL surveys were given to his wife to administer today with instructions that they should consider 2 weeks prior to hospitalization when answering the questions. Unable to complete Neurocognitive Trailmaking (could barely sign name and he did not want to attempt) nor test (IABP in place).    Total Session Time: 90 minutes  Brad Alberts, RN VAD Coordinator   Office: [phone] 24/7 VAD Pager: [phone]

## 2015-09-21 NOTE — Progress Notes (Signed)
ANTICOAGULATION CONSULT NOTE -  Follow Up Consult  Pharmacy Consult for Heparin Indication: atrial fibrillation   No Known Allergies  Patient Measurements: Height: 5\' 7"  (170.2 cm) Weight: 139 lb 5.3 oz (63.2 kg) IBW/kg (Calculated) : 66.1 Heparin Dosing Weight: 53.4kg  Labs:  Recent Labs  09/19/15 0500  09/19/15 1850 09/20/15 09/20/15 0540 09/20/15 0751 09/20/15 1430 09/21/15 0544  HGB 13.4  --   --   --  12.6*  --   --  11.9*  HCT 37.3*  --   --   --  35.7*  --   --  34.4*  PLT 411*  --   --   --  327  --   --  232  APTT 46*  < >  --  62*  --  80* 70*  --   HEPARINUNFRC  --   < >  --  0.30  --  0.34 0.32 0.51  CREATININE 2.39*  --  2.31*  --  2.08*  --   --  1.93*  < > = values in this interval not displayed.   Assessment: Patient is 74 yo with h/o PAF, CVA, HTN, heart falilure. Previously on Eliquis PTA and started on IV heparin as inpatient. Heparin stopped 12/22 d/t hematuria.  He was taken to cath lab for IABP placement 12/23 and resumed heparin after. Hematuria resolved. Will use lower therapeutic goal given recent bleeding and current IABP. CBC stable. No bleeding per RN  HL 0.51(therapeutic, but slightly higher than our conservative goal).     Goal of Therapy:  Heparin level 0.3-0.5 APTT 66-90 sec Monitor platelets by anticoagulation protocol: Yes    Plan:  Decrease Heparin drip to 500 units/hr, recheck with AM labs Daily HL, CBC, and monitor s/sx bleeding  Remi Haggard, PharmD Clinical Pharmacist- Resident Pager: [phone]   09/21/2015 9:09 AM

## 2015-09-21 NOTE — Progress Notes (Addendum)
PT Cancellation Note/D/C Note  Patient Details Name: Brecker Feuerborn MRN: [id number] DOB: [birth year]   Cancelled Treatment:    Reason Eval/Treat Not Completed: Medical issues which prohibited therapy (Pt still with IABP.  Pt going for LVAD Wed or Thurs.  Will sign off and please reorder after LVAD placement.)   Berline Lopes 09/21/2015, 12:07 PM Din Bookwalter,PT Acute Rehabilitation [phone] (pager)

## 2015-09-22 ENCOUNTER — Encounter (HOSPITAL_COMMUNITY): Payer: Self-pay | Admitting: Internal Medicine

## 2015-09-22 ENCOUNTER — Inpatient Hospital Stay (HOSPITAL_COMMUNITY): Payer: Medicare Other

## 2015-09-22 DIAGNOSIS — I480 Paroxysmal atrial fibrillation: Secondary | ICD-10-CM

## 2015-09-22 DIAGNOSIS — I5043 Acute on chronic combined systolic (congestive) and diastolic (congestive) heart failure: Secondary | ICD-10-CM

## 2015-09-22 DIAGNOSIS — N183 Chronic kidney disease, stage 3 (moderate): Secondary | ICD-10-CM

## 2015-09-22 LAB — GLUCOSE, CAPILLARY
GLUCOSE-CAPILLARY: 159 mg/dL — AB (ref 65–99)
Glucose-Capillary: 90 mg/dL (ref 65–99)
Glucose-Capillary: 148 mg/dL — ABNORMAL HIGH (ref 65–99)
Glucose-Capillary: 133 mg/dL — ABNORMAL HIGH (ref 65–99)

## 2015-09-22 LAB — HEPARIN LEVEL (UNFRACTIONATED)
HEPARIN UNFRACTIONATED: 0.22 [IU]/mL — AB (ref 0.30–0.70)
HEPARIN UNFRACTIONATED: 0.3 [IU]/mL (ref 0.30–0.70)
Heparin Unfractionated: 0.22 IU/mL — ABNORMAL LOW (ref 0.30–0.70)

## 2015-09-22 LAB — CBC
HCT: 34.7 % — ABNORMAL LOW (ref 39.0–52.0)
HEMOGLOBIN: 12.1 g/dL — AB (ref 13.0–17.0)
MCH: 30.3 pg (ref 26.0–34.0)
MCHC: 34.9 g/dL (ref 30.0–36.0)
MCV: 86.8 fL (ref 78.0–100.0)
PLATELETS: 236 10*3/uL (ref 150–400)
RBC: 4 MIL/uL — AB (ref 4.22–5.81)
RDW: 14.4 % (ref 11.5–15.5)
WBC: 8.3 10*3/uL (ref 4.0–10.5)

## 2015-09-22 LAB — CARBOXYHEMOGLOBIN
CARBOXYHEMOGLOBIN: 1.3 % (ref 0.5–1.5)
METHEMOGLOBIN: 0.9 % (ref 0.0–1.5)
O2 Saturation: 77.9 %
Total hemoglobin: 12.2 g/dL — ABNORMAL LOW (ref 13.5–18.0)

## 2015-09-22 LAB — PROTEIN ELECTROPHORESIS, SERUM
A/G Ratio: 0.8 (ref 0.7–1.7)
Albumin ELP: 2.4 g/dL — ABNORMAL LOW (ref 2.9–4.4)
Alpha-1-Globulin: 0.3 g/dL (ref 0.0–0.4)
Alpha-2-Globulin: 0.9 g/dL (ref 0.4–1.0)
Beta Globulin: 0.8 g/dL (ref 0.7–1.3)
GAMMA GLOBULIN: 1 g/dL (ref 0.4–1.8)
GLOBULIN, TOTAL: 3 g/dL (ref 2.2–3.9)
TOTAL PROTEIN ELP: 5.4 g/dL — AB (ref 6.0–8.5)

## 2015-09-22 LAB — CULTURE, BLOOD (ROUTINE X 2)
Culture: NO GROWTH
Culture: NO GROWTH

## 2015-09-22 LAB — BASIC METABOLIC PANEL
ANION GAP: 6 (ref 5–15)
BUN: 19 mg/dL (ref 6–20)
CALCIUM: 7.8 mg/dL — AB (ref 8.9–10.3)
CO2: 18 mmol/L — AB (ref 22–32)
CREATININE: 1.61 mg/dL — AB (ref 0.61–1.24)
Chloride: 123 mmol/L — ABNORMAL HIGH (ref 101–111)
GFR calc non Af Amer: 40 mL/min — ABNORMAL LOW (ref >60)
GFR, EST AFRICAN AMERICAN: 47 mL/min — AB (ref >60)
Glucose, Bld: 113 mg/dL — ABNORMAL HIGH (ref 65–99)
Potassium: 3.9 mmol/L (ref 3.5–5.1)
SODIUM: 147 mmol/L — AB (ref 135–145)

## 2015-09-22 LAB — HEPATITIS B SURFACE ANTIGEN: Hepatitis B Surface Ag: NEGATIVE

## 2015-09-22 LAB — PROTIME-INR
INR: 1.33 (ref 0.00–1.49)
Prothrombin Time: 16.6 seconds — ABNORMAL HIGH (ref 11.6–15.2)

## 2015-09-22 LAB — HEPATITIS B CORE ANTIBODY, TOTAL: HEP B C TOTAL AB: POSITIVE — AB

## 2015-09-22 LAB — PREPARE RBC (CROSSMATCH)

## 2015-09-22 LAB — HEPATITIS C ANTIBODY

## 2015-09-22 LAB — HEPATITIS B SURFACE ANTIBODY,QUALITATIVE: Hep B S Ab: NONREACTIVE

## 2015-09-22 MED ORDER — BISACODYL 5 MG PO TBEC
5.0000 mg | DELAYED_RELEASE_TABLET | Freq: Once | ORAL | Status: AC
  Administered 2015-09-22: 5 mg via ORAL
  Filled 2015-09-22: qty 1

## 2015-09-22 MED ORDER — FLUCONAZOLE IN SODIUM CHLORIDE 400-0.9 MG/200ML-% IV SOLN
400.0000 mg | INTRAVENOUS | Status: DC
  Filled 2015-09-22: qty 200

## 2015-09-22 MED ORDER — MUPIROCIN 2 % EX OINT
1.0000 "application " | TOPICAL_OINTMENT | Freq: Two times a day (BID) | CUTANEOUS | Status: DC
  Administered 2015-09-22: 1 via NASAL
  Filled 2015-09-22: qty 22

## 2015-09-22 MED ORDER — VANCOMYCIN HCL 1000 MG IV SOLR
1000.0000 mg | INTRAVENOUS | Status: AC
  Administered 2015-09-23: 1000 mg
  Filled 2015-09-22 (×3): qty 1000

## 2015-09-22 MED ORDER — DOBUTAMINE IN D5W 4-5 MG/ML-% IV SOLN
2.0000 ug/kg/min | INTRAVENOUS | Status: DC
Start: 2015-09-23 — End: 2015-09-23
  Filled 2015-09-22: qty 250

## 2015-09-22 MED ORDER — CHLORHEXIDINE GLUCONATE 0.12 % MT SOLN
15.0000 mL | Freq: Once | OROMUCOSAL | Status: AC
  Administered 2015-09-23: 15 mL via OROMUCOSAL
  Filled 2015-09-22: qty 15

## 2015-09-22 MED ORDER — POTASSIUM CHLORIDE 2 MEQ/ML IV SOLN
80.0000 meq | INTRAVENOUS | Status: DC
  Filled 2015-09-22: qty 40

## 2015-09-22 MED ORDER — VASOPRESSIN 20 UNIT/ML IV SOLN
0.0400 [IU]/min | INTRAVENOUS | Status: DC
  Filled 2015-09-22: qty 2

## 2015-09-22 MED ORDER — NOREPINEPHRINE BITARTRATE 1 MG/ML IV SOLN
0.0000 ug/min | INTRAVENOUS | Status: DC
  Filled 2015-09-22: qty 4

## 2015-09-22 MED ORDER — CHLORHEXIDINE GLUCONATE CLOTH 2 % EX PADS
6.0000 | MEDICATED_PAD | Freq: Once | CUTANEOUS | Status: AC
  Administered 2015-09-23: 6 via TOPICAL

## 2015-09-22 MED ORDER — TEMAZEPAM 15 MG PO CAPS: 15.0000 mg | ORAL_CAPSULE | Freq: Once | ORAL | Status: DC | PRN

## 2015-09-22 MED ORDER — PHENYLEPHRINE HCL 10 MG/ML IJ SOLN
0.0000 ug/min | INTRAVENOUS | Status: DC
  Filled 2015-09-22: qty 2

## 2015-09-22 MED ORDER — INSULIN REGULAR HUMAN 100 UNIT/ML IJ SOLN
INTRAMUSCULAR | Status: DC
  Filled 2015-09-22: qty 2.5

## 2015-09-22 MED ORDER — SODIUM CHLORIDE 0.9 % IV SOLN
600.0000 mg | INTRAVENOUS | Status: DC
  Filled 2015-09-22: qty 600

## 2015-09-22 MED ORDER — SODIUM CHLORIDE 0.9 % IV SOLN
INTRAVENOUS | Status: DC
  Filled 2015-09-22: qty 30

## 2015-09-22 MED ORDER — DEXMEDETOMIDINE HCL IN NACL 400 MCG/100ML IV SOLN
0.1000 ug/kg/h | INTRAVENOUS | Status: DC
  Filled 2015-09-22: qty 100

## 2015-09-22 MED ORDER — NITROGLYCERIN IN D5W 200-5 MCG/ML-% IV SOLN
0.0000 ug/min | INTRAVENOUS | Status: DC
  Filled 2015-09-22: qty 250

## 2015-09-22 MED ORDER — CHLORHEXIDINE GLUCONATE CLOTH 2 % EX PADS
6.0000 | MEDICATED_PAD | Freq: Once | CUTANEOUS | Status: AC
  Administered 2015-09-22: 6 via TOPICAL

## 2015-09-22 MED ORDER — MAGNESIUM SULFATE 50 % IJ SOLN
40.0000 meq | INTRAMUSCULAR | Status: DC
  Filled 2015-09-22: qty 10

## 2015-09-22 MED ORDER — VANCOMYCIN HCL 10 G IV SOLR
1250.0000 mg | INTRAVENOUS | Status: DC
  Filled 2015-09-22: qty 1250

## 2015-09-22 MED ORDER — EPINEPHRINE HCL 1 MG/ML IJ SOLN
0.0000 ug/min | INTRAMUSCULAR | Status: DC
  Filled 2015-09-22: qty 4

## 2015-09-22 MED ORDER — DEXTROSE 5 % IV SOLN
750.0000 mg | INTRAVENOUS | Status: DC
  Filled 2015-09-22 (×2): qty 750

## 2015-09-22 MED ORDER — SODIUM CHLORIDE 0.9 % IV SOLN
600.0000 mg | INTRAVENOUS | Status: DC
  Administered 2015-09-23: 600 mg via INTRAVENOUS
  Filled 2015-09-22: qty 600

## 2015-09-22 MED ORDER — DOPAMINE-DEXTROSE 3.2-5 MG/ML-% IV SOLN
0.0000 ug/kg/min | INTRAVENOUS | Status: DC
Start: 2015-09-23 — End: 2015-09-23
  Filled 2015-09-22: qty 250

## 2015-09-22 MED ORDER — MILRINONE IN DEXTROSE 20 MG/100ML IV SOLN: 0.3000 ug/kg/min | INTRAVENOUS | Status: DC

## 2015-09-22 MED ORDER — DIAZEPAM 5 MG PO TABS
5.0000 mg | ORAL_TABLET | Freq: Once | ORAL | Status: AC
  Administered 2015-09-23: 5 mg via ORAL
  Filled 2015-09-22: qty 1

## 2015-09-22 MED ORDER — DEXTROSE 5 % IV SOLN
1.5000 g | INTRAVENOUS | Status: DC
  Filled 2015-09-22: qty 1.5

## 2015-09-22 MED ORDER — DIAZEPAM 5 MG PO TABS: 5.0000 mg | ORAL_TABLET | ORAL | Status: DC | PRN

## 2015-09-22 MED ORDER — SODIUM CHLORIDE 0.9 % IV SOLN
INTRAVENOUS | Status: DC
  Filled 2015-09-22: qty 40

## 2015-09-22 NOTE — Progress Notes (Signed)
ANTICOAGULATION CONSULT NOTE - Follow Up Consult  Pharmacy Consult for heparin Indication: atrial fibrillation  Labs:  Recent Labs  09/19/15 1850 09/20/15  09/20/15 0540 09/20/15 0751 09/20/15 1430 09/21/15 0544 09/22/15 0500  HGB  --   --   < > 12.6*  --   --  11.9* 12.1*  HCT  --   --   --  35.7*  --   --  34.4* 34.7*  PLT  --   --   --  327  --   --  232 236  APTT  --  62*  --   --  80* 70*  --   --   HEPARINUNFRC  --  0.30  --   --  0.34 0.32 0.51 0.22*  CREATININE 2.31*  --   --  2.08*  --   --  1.93*  --   < > = values in this interval not displayed.    Assessment: 74yo male subtherapeutic on heparin after rate change; RN notes that pt had been bleeding from IV site in hand which resolved when IV site was changed, now notes some bleeding from penis but also notes traumatic cath.  Goal of Therapy:  Heparin level 0.3-0.5 units/ml   Plan:  Will increase heparin gtt to 550 units/hr where pt was previously therapeutic and check level in 8hr.  Vernard Gambles, PharmD, BCPS  09/22/2015,6:22 AM

## 2015-09-22 NOTE — Progress Notes (Signed)
4 Days Post-Op Procedure(s) (LRB): Right Heart Cath (N/A) IABP Insertion (N/A) Subjective: Patient examined and chest x-ray, transthoracic echocardiogram images independently reviewed  74 year old AA male with nonischemic cardiomyopathy since 2007, remained fairly well compensated until recently when he developed class IV symptoms and right heart cath documented cardiogenic shock with ejection fraction of 20%. His renal function and hepatic function are now recovered after placement of balloon pump and milrinone infusion. His mixed venous saturation has significant way improved greater than 70% and his symptoms have nicely responded.  He appears to be a appropriate candidate for destination therapy VAD implantation and this is clearly his best long-term therapeutic option. The patient understands he is not currently a candidate for cardiac transplantation because he has not been evaluated for transplantation and is not on a transplant list. At age 66 he understands the implanted VAD has the realistic  potential benefits of improved survival and improve quality of life. He also understands there are risks to the operation and the postoperative period including stroke, bleeding, infection, acute renal failure, and death.  Objective: Vital signs in last 24 hours: Temp:  [98.1 F (36.7 C)-98.8 F (37.1 C)] 98.2 F (36.8 C) (12/27 0900) Pulse Rate:  [74-85] 84 (12/27 1000) Cardiac Rhythm:  [-] A-V Sequential paced (12/27 0800) Resp:  [13-27] 24 (12/27 1000) BP: (124-202)/(37-57) 126/37 mmHg (12/27 1000) SpO2:  [93 %-98 %] 97 % (12/27 1000) Arterial Line BP: (107-188)/(28-58) 107/36 mmHg (12/27 1000) Weight:  [140 lb 14 oz (63.9 kg)] 140 lb 14 oz (63.9 kg) (12/27 0500)  Hemodynamic parameters for last 24 hours: PAP: (43-73)/(1-25) 49/15 mmHg CVP:  [0 mmHg-8 mmHg] 1 mmHg PCWP:  [7 mmHg-15 mmHg] 15 mmHg CO:  [5.5 L/min-5.9 L/min] 5.7 L/min CI:  [3.2 L/min/m2-3.6 L/min/m2] 3.3  L/min/m2  Intake/Output from previous day: 12/26 0701 - 12/27 0700 In: 2533.2 [P.O.:400; I.V.:1783.2; IV Piggyback:350] Out: 1640 [Urine:1640] Intake/Output this shift: Total I/O In: 191 [I.V.:141; IV Piggyback:50] Out: 250 [Urine:250]      Physical Exam  General: Middle-aged AA   male with CCU on balloon pump in no acute distress  HEENT: Normocephalic pupils equal , dentition adequate Neck: Supple without JVD, adenopathy, or bruit Chest: Clear to auscultation, symmetrical breath sounds, no rhonchi, no tenderness             or deformity Cardiovascular: Regular rate and rhythm, no murmur, no gallop, peripheral pulses             palpable in all extremities Abdomen:  Soft, nontender, no palpable mass or organomegaly Extremities: Warm, well-perfused, no clubbing cyanosis,   Mild edema without  tenderness,              no venous stasis changes of the legs Rectal/GU: Deferred-Foley catheter in place Neuro: Grossly non--focal and symmetrical throughout Skin: Clean and dry without rash or ulceration   Lab Results:  Recent Labs  09/21/15 0544 09/22/15 0500  WBC 9.3 8.3  HGB 11.9* 12.1*  HCT 34.4* 34.7*  PLT 232 236   BMET:  Recent Labs  09/21/15 0544 09/22/15 0700  NA 145 147*  K 3.4* 3.9  CL 118* 123*  CO2 21* 18*  GLUCOSE 123* 113*  BUN 23* 19  CREATININE 1.93* 1.61*  CALCIUM 7.6* 7.8*    PT/INR: No results for input(s): LABPROT, INR in the last 72 hours. ABG    Component Value Date/Time   PHART 7.470* 08/27/2015 1307   HCO3 15.9* 08/29/2015 1307   TCO2 17 08/28/2015  1307   ACIDBASEDEF 5.0* 09/02/2015 1307   O2SAT 77.9 09/22/2015 0535   CBG (last 3)   Recent Labs  09/21/15 1112 09/21/15 1544 09/21/15 2119  GLUCAP 142* 138* 140*    Assessment/Plan: S/P Procedure(s) (LRB): Right Heart Cath (N/A) IABP Insertion (N/A) The patient is scheduled for CT scan of chest and abdomen to complete his preoperative screening prior to VAD.  The patient  understands we will schedule VAD implantation in the next 1-2 days and then after VAD implantation we will plan on removing the intra-aortic balloon pump.   LOS: 6 days    Kathlee Nations Trigt III 09/22/2015

## 2015-09-22 NOTE — Progress Notes (Signed)
ANTICOAGULATION CONSULT NOTE -  Follow Up Consult  Pharmacy Consult for Heparin Indication: atrial fibrillation   No Known Allergies  Patient Measurements: Height: 5\' 7"  (170.2 cm) Weight: 140 lb 14 oz (63.9 kg) IBW/kg (Calculated) : 66.1 Heparin Dosing Weight: 63.9 kg  Labs:  Recent Labs  09/20/15  09/20/15 0540 09/20/15 0751 09/20/15 1430 09/21/15 0544 09/22/15 0500 09/22/15 0700 09/22/15 1130  HGB  --   < > 12.6*  --   --  11.9* 12.1*  --   --   HCT  --   --  35.7*  --   --  34.4* 34.7*  --   --   PLT  --   --  327  --   --  232 236  --   --   APTT 62*  --   --  80* 70*  --   --   --   --   LABPROT  --   --   --   --   --   --   --   --  16.6*  INR  --   --   --   --   --   --   --   --  1.33  HEPARINUNFRC 0.30  --   --  0.34 0.32 0.51 0.22*  --   --   CREATININE  --   --  2.08*  --   --  1.93*  --  1.61*  --   < > = values in this interval not displayed.   Assessment: Patient is 74 yo with h/o PAF, CVA, HTN, heart falilure. Previously on Eliquis PTA and started on IV heparin as inpatient. Heparin stopped 12/22 d/t hematuria.  He was taken to cath lab for IABP placement 12/23 and resumed heparin after. Will use lower therapeutic goal given recent bleeding and current IABP. Overnight minor bleeding events (penis and IV line) resolved.  HL 0.22 (subtherapeutic), CBC stable  Goal of Therapy:  Heparin level 0.3-0.5 Monitor platelets by anticoagulation protocol: Yes    Plan:  Increase Heparin drip to 650 units/hr Recheck HL in 8 hours Daily HL, CBC, and monitor s/sx bleeding  Remi Haggard, PharmD Clinical Pharmacist- Resident Pager: [phone]   09/22/2015 2:55 PM

## 2015-09-22 NOTE — Progress Notes (Signed)
ANTICOAGULATION CONSULT NOTE  Pharmacy Consult for Heparin Indication: atrial fibrillation   No Known Allergies  Patient Measurements: Height: 5\' 7"  (170.2 cm) Weight: 140 lb 14 oz (63.9 kg) IBW/kg (Calculated) : 66.1 Heparin Dosing Weight: 63.9 kg  Labs:  Recent Labs  09/20/15  09/20/15 0540 09/20/15 0751 09/20/15 1430 09/21/15 0544 09/22/15 0500 09/22/15 0700 09/22/15 1130 09/22/15 1433 09/22/15 2315  HGB  --   < > 12.6*  --   --  11.9* 12.1*  --   --   --   --   HCT  --   --  35.7*  --   --  34.4* 34.7*  --   --   --   --   PLT  --   --  327  --   --  232 236  --   --   --   --   APTT 62*  --   --  80* 70*  --   --   --   --   --   --   LABPROT  --   --   --   --   --   --   --   --  16.6*  --   --   INR  --   --   --   --   --   --   --   --  1.33  --   --   HEPARINUNFRC 0.30  --   --  0.34 0.32 0.51 0.22*  --   --  0.22* 0.30  CREATININE  --   --  2.08*  --   --  1.93*  --  1.61*  --   --   --   < > = values in this interval not displayed.   Assessment: 74 y.o. male with Afib/IABP for heparin  Goal of Therapy:  Heparin level 0.3-0.5 Monitor platelets by anticoagulation protocol: Yes    Plan:  Continue Heparin at current rate  Follow-up am labs.    Geannie Risen, PharmD, BCPS  09/22/2015 11:51 PM

## 2015-09-22 NOTE — Progress Notes (Signed)
Advanced Heart Failure Rounding Note   Subjective:    Admitted from [facility] with cardiogenic shock for advanced heart failure.   On 12/22 foley placed by Urology requiring urethral dilation -> UTI . ? Septic shock. On vanco/zosyn   IABP placed 12/23. Now on milrinone and intermittent levophed. BiV pacing rate turned up to 75.   Continues to feel better. Renal function improving. No dyspnea or orthopnea. Tolerating IABP. Sildenafil started for elevated PVR  Swan #s RA 3 PA 46/20 (28) PCWP 15 Therm CO/CI 5.5/3.2 PVR 2.7 WU SVR 1117  Creatinine improving 2.0>2.56 > 2.65> 2.39 > 2.08> 1.93> 1.6 Co-ox 78%   Objective:   Weight Range:  Vital Signs:   Temp:  [98.1 F (36.7 C)-98.8 F (37.1 C)] 98.6 F (37 C) (12/27 0700) Pulse Rate:  [74-85] 75 (12/27 0700) Resp:  [13-27] 15 (12/27 0700) BP: (124-202)/(37-57) 157/49 mmHg (12/27 0700) SpO2:  [93 %-98 %] 96 % (12/27 0700) Arterial Line BP: (124-188)/(28-58) 135/45 mmHg (12/27 0700) Weight:  [63.9 kg (140 lb 14 oz)] 63.9 kg (140 lb 14 oz) (12/27 0500) Last BM Date: 09/21/15  Weight change: Filed Weights   09/20/15 0530 09/21/15 0500 09/22/15 0500  Weight: 60.3 kg (132 lb 15 oz) 63.2 kg (139 lb 5.3 oz) 63.9 kg (140 lb 14 oz)    Intake/Output:   Intake/Output Summary (Last 24 hours) at 09/22/15 0746 Last data filed at 09/22/15 0700  Gross per 24 hour  Intake 2483.2 ml  Output   1640 ml  Net  843.2 ml     Physical Exam: General: Awake  In bed.  No resp difficulty HEENT: normal Neck: supple. RIJ swan. Carotids 2+ bilat; no bruits. No lymphadenopathy or thryomegaly appreciated. Cor: PMI nondisplaced. Regular rate & rhythm. No rubs, or murmurs. +S3  Lungs: clear Abdomen: soft, nontender, nondistended. No hepatosplenomegaly. No bruits or masses. Good bowel sounds. Extremities: no cyanosis, clubbing, rash, edema RUE PICC  LUE radial arterial line. R groin IABP Neuro: alert & orientedx3, cranial nerves grossly  intact. moves all 4 extremities w/o difficulty. Affect pleasant GU: Foley - clear urine   Telemetry: AV paced 75  Labs: Basic Metabolic Panel:  Recent Labs Lab 09/19/15 0500 09/19/15 1850 09/20/15 0540 09/21/15 0544 09/21/15 1025 09/22/15 0700  NA 144 141 143 145  --  147*  K 4.0 3.8 3.6 3.4*  --  3.9  CL 114* 114* 116* 118*  --  123*  CO2 21* 19* 20* 21*  --  18*  GLUCOSE 156* 166* 163* 123*  --  113*  BUN 37* 36* 32* 23*  --  19  CREATININE 2.39* 2.31* 2.08* 1.93*  --  1.61*  CALCIUM 7.0* 7.6* 7.6* 7.6*  --  7.8*  MG  --   --   --   --  2.0  --     Liver Function Tests:  Recent Labs Lab 09/07/2015 2017  AST 36  ALT 31  ALKPHOS 117  BILITOT 1.5*  PROT 5.7*  ALBUMIN 2.5*   No results for input(s): LIPASE, AMYLASE in the last 168 hours. No results for input(s): AMMONIA in the last 168 hours.  CBC:  Recent Labs Lab 09/10/2015 0533 09/19/15 0500 09/20/15 0540 09/21/15 0544 09/22/15 0500  WBC 15.1* 12.3* 10.6* 9.3 8.3  HGB 18.3* 13.4 12.6* 11.9* 12.1*  HCT 51.4 37.3* 35.7* 34.4* 34.7*  MCV 88.3 86.7 87.3 86.4 86.8  PLT 625* 411* 327 232 236    Cardiac Enzymes: No  results for input(s): CKTOTAL, CKMB, CKMBINDEX, TROPONINI in the last 168 hours.  BNP: BNP (last 3 results)  Recent Labs  09/18/2015 1803  BNP >4500.0*    ProBNP (last 3 results) No results for input(s): PROBNP in the last 8760 hours.    Other results:  Imaging: Dg Chest Port 1 View  09/20/2015  CLINICAL DATA:  Balloon pump placement EXAM: PORTABLE CHEST - 1 VIEW COMPARISON:  09/17/2015 FINDINGS: Placement of a right IJ pulmonary arterial catheter. No pneumothorax. IABP tip projects just below the aortic arch. Right arm PICC and left subclavian AICD stable in position. Heart size upper limits normal. Left retrocardiac consolidation/atelectasis. No new infiltrate. No effusion. Visualized skeletal structures are unremarkable. IMPRESSION: 1. Support hardware placement as above, without  pneumothorax or other apparent complication. 2. Persistent left retrocardiac consolidation/atelectasis. Electronically Signed   By: Corlis Leak M.D.   On: 09/20/2015 11:42     Medications:     Scheduled Medications: . amiodarone  200 mg Oral Daily  . atorvastatin  40 mg Oral q1800  . hydrALAZINE  50 mg Oral 3 times per day  . insulin aspart  0-5 Units Subcutaneous QHS  . insulin aspart  0-9 Units Subcutaneous TID WC  . piperacillin-tazobactam (ZOSYN)  IV  3.375 g Intravenous 3 times per day  . sildenafil  20 mg Oral TID  . sodium chloride  10-40 mL Intracatheter Q12H  . sodium chloride  3 mL Intravenous Q12H  . sodium chloride  3 mL Intravenous Q12H  . sodium chloride  3 mL Intravenous Q12H  . vancomycin  1,000 mg Intravenous Q24H    Infusions: . sodium chloride 50 mL/hr at 09/21/15 1942  . heparin 550 Units/hr (09/22/15 0621)  . milrinone 0.25 mcg/kg/min (09/21/15 2259)  . norepinephrine (LEVOPHED) Adult infusion Stopped (09/20/15 1100)  . vasopressin (PITRESSIN) infusion - *FOR SHOCK* Stopped (09/19/15 0730)    PRN Medications: sodium chloride, Place/Maintain arterial line **AND** sodium chloride, sodium chloride, sodium chloride, acetaminophen, acetaminophen, hydrALAZINE, ondansetron (ZOFRAN) IV, ondansetron (ZOFRAN) IV, sodium chloride, sodium chloride, sodium chloride, sodium chloride   Assessment:   1. Cardiogenic/septicshock    --IABP placed 12/23 2. 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 RV mild to moderate HK  --s/p St. Jude CRT-D 3. PAF on Eiiquis  4. 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 (previous baseline ~1.5) 6. HTN 7. Hyperlipidemia 8. Pulmonary HTN 9. Urinary Retention s/p urethral dilation - hematurai resolved.   Plan/Discussion:    Presented as INTERMACS-1. Continues to improve with IABP and inotropes. Ernestine Conrad numbers done  personally at bedside with nurse and cardiac output now in normal range with IABP and pressors. Actually having to use hydralazine for HTN. Filling pressures ok. PVR improved on sildenafil. . Continue to hold lasix today but may need a dose soon.  Renal function improving with MCS. Continue IABP, milrinone +/- levophed. Heparin for IABP. Will leave d/c swan and art line today. He has been seen by Dr. Laneta Simmers and planning VAD this week. Either Wednesday or Thursday depending on schedule and insurance improval   Continue broad spectrum abx for urosepsis s/p urethral dilation. Hematuria now resolved.    BiV pacing rate has been turned up and BP has responded well. Remains on amio for PAF. Now in NSR.   Renal function now improving. Now in acceptable range.   PVCs quieted down  with supping electrolytes.  Check mag. Supp K+  The patient is critically ill with multiple organ systems failure and requires high complexity decision making for assessment and support, frequent evaluation and titration of therapies, application of advanced monitoring technologies and extensive interpretation of multiple databases.   Critical Care Time devoted to patient care services described in this note is 45 Minutes.  Marian Meneely,MD 7:46 AM Advanced Heart Failure Team Pager [phone] (M-F; 7a - 4p)  Please contact [facility] for night-coverage after hours (4p -7a ) and weekends on [email or web address]

## 2015-09-22 NOTE — Progress Notes (Signed)
LVAD Coordinator Rounding Note: Visited with patient today. I called his wife earlier today and instructed her on what to expect for tomorrow and where I can reach her to provide updates. She will be coming to the hospital tomorrow ~ 0500 to see her husband prior to the procedure. Arranging for hotel accommodations for the week.   Discussed today at [facility] and will need to see if we can set them up for 5- 7 days in a hotel locally so they may get used to the equipment near by (106 mile commute from home). Looked into closer VAD centers for possibility for share care later on should that be appropriate for him. [facility] is closest hospital (~60 miles).   Will continue to assess needs prior to D/C. Scheduled implant tomorrow AM first case. [facility] rep aware to turn ICD off tomorrow prior to case start. Patient has no questions for me at this time and feels good about the operation tomorrow.   Rexene Alberts, RN VAD Coordinator   Office: [phone] 24/7 VAD Pager: [phone]

## 2015-09-22 NOTE — Progress Notes (Signed)
[name] has been discussed with the VAD Medical Review board on 09/22/15. The team feels as if the patient is a good candidate for Destination LVAD therapy. The patient meets criteria for a LVAD implant as listed below:  1)  Has failed to respond to optimal medical management (including beta-blockers and ACE inhibitors if tolerated) for at least 45 of the last 60 days and has recently been balloon pump and dual pressor dependent for 5 days, and:        *On Inotropes Milrinone started 09/08/2015 (Switched from Dobutamine which was started at [facility] in [place]). IABP in place since 09/20/2015.  2)  Has a left ventricular ejection fraction (LVEF) < 25% and, have demonstrated functional limitation with a peak oxygen consumption of <14 ml/kg/min unless balloon pump or inotrope dependent or physically unable to perform the test.         *EF 5%  By echo  09/12/2015 ([facility])            *CPX (date)  pVO2 ____ RER_____ VE/VCo2 slope_____ *unable to complete prior to admission and currently IABP dependent to avoid end organ dysfunction.   3)  Social work and palliative care evaluations demonstrate appropriate support system in place for discharge to home with a VAD and that end of life discussions have taken place. Both services have expressed no concern regarding patient's candidacy.         *Social work consult (date): Raquel Sarna 09/17/15        *Palliative Care Consult (date): Vinie Sill, NP 60/63/01  4)  Primary caretaker identified that can be taught along with the patient how to manage        the VAD equipment.        *Name: Brad [name] (wife)  5)  Deemed appropriate by our financial coordinator: 09/22/2015        Prior approval: none required for Medicare primary and none required for [employer] supplemental.  6)  VAD Coordinator, Janene Madeira, RN has met with patient and caregiver, shown them the VAD equipment and discussed with the patient and  caregiver about lifestyle changes necessary for success on mechanical circulatory device.        *Met with Brad [name] and his wife on 09/21/15.       *Consent for VAD Evaluation/Caregiver Agreement/HIPPA Release/Photo Release signed on 09/21/15.  7)  Patient has not been discussed with transplant center due to acute INTERMACs status I and age of 74 years old.    8)  Intermacs profile: I  INTERMACS 1: Critical cardiogenic shock describes a patient who is "crashing and burning", in which a patient has life-threatening hypotension and rapidly escalating inotropic pressor support, with critical organ hypoperfusion often confirmed by worsening acidosis and lactate levels.  INTERMACS 2: Progressive decline describes a patient who has been demonstrated "dependent" on inotropic support but nonetheless shows signs of continuing deterioration in nutrition, renal function, fluid retention, or other major status indicator. Patient profile 2 can also describe a patient with refractory volume overload, perhaps with evidence of impaired perfusion, in whom inotropic infusions cannot be maintained due to tachyarrhythmias, clinical ischemia, or other intolerance.  INTERMACS 3: Stable but inotrope dependent describes a patient who is clinically stable on mild-moderate doses of intravenous inotropes (or has a temporary circulatory support device) after repeated documentation of failure to wean without symptomatic hypotension, worsening symptoms, or progressive organ dysfunction (usually renal). It is critical to monitor nutrition, renal function, fluid  balance, and overall status carefully in order to distinguish between a  patient who is truly stable at Patient Profile 3 and a patient who has unappreciated decline rendering them Patient Profile 2. This patient may be either at home or in the hospital.   INTERMACS 4: Resting symptoms describes a patient who is at home on oral therapy but frequently has symptoms  of congestion at rest or with activities of daily living (ADL). He or she may have orthopnea, shortness of breath during ADL such as dressing or bathing, gastrointestinal symptoms (abdominal discomfort, nausea, poor appetite), disabling ascites or severe lower extremity edema. This patient should be carefully considered for more intensive management and surveillance programs, which may in some cases, reveal poor compliance that would compromise outcomes with any therapy.  .  INTERMACS 5: Exertion Intolerant describes a patient who is comfortable at rest but unable to engage in any activity, living predominantly within the house or housebound. This patient has no congestive symptoms, but may have chronically elevated volume status, frequently with renal dysfunction, and may be characterized as exercise intolerant.   INTERMACS 6: Exertion Limited also describes a patient who is comfortable at rest without evidence of fluid overload, but who is able to do some mild activity. Activities of daily living are comfortable and minor activities outside the home such as visiting friends or going to a restaurant can be performed, but fatigue results within a few minutes of any meaningful physical exertion. This patient has occasional episodes of worsening symptoms and is likely to have had a hospitalization for heart failure within the past year.   INTERMACS 7: Advanced NYHA Class 3 describes a patient who is clinically stable with a reasonable level of comfortable activity, despite history of previous decompensation that is not recent. This patient is usually able to walk more than a block. Any decompensation requiring intravenous diuretics or hospitalization within the previous month should make this person a Patient Profile 6 or lower.    9)  NYHA Class: IV  Surgery has been scheduled 09/02/2015 first case with Dr. Prescott Gum.   Patient INTERMACS-! With profound shock requiring Inotrope and IABP support.    Bensimhon, Daniel,MD 6:21 PM

## 2015-09-22 NOTE — Progress Notes (Signed)
Pt transported to CT with 3 RNs and transporter, IABP and monitor. Transport and scan uneventful, pt tolerated procedure.

## 2015-09-22 NOTE — Progress Notes (Signed)
Pt. Having lots of ectopy when IABP printed strip

## 2015-09-22 NOTE — Anesthesia Preprocedure Evaluation (Addendum)
Anesthesia Evaluation  Patient identified by MRN, date of birth, ID band Patient awake    Reviewed: Allergy & Precautions, NPO status , Patient's Chart, lab work & pertinent test results, reviewed documented beta blocker date and time   Airway Mallampati: II  TM Distance: >3 FB Neck ROM: Full    Dental no notable dental hx. (+) Dental Advisory Given   Pulmonary shortness of breath,    breath sounds clear to auscultation       Cardiovascular hypertension, Pt. on medications and Pt. on home beta blockers +CHF   Rhythm:Regular  Echo 09/21/2015 - - Left ventricle: The cavity size was normal. Wall thickness wasincreased in a pattern of mild LVH. Systolic function was severely reduced. The estimated ejection fraction was in the range of 20% to 25%. There is akinesis of the entireinferior and apical myocardium. There is akinesis of the apicalanteroseptal myocardium. Doppler parameters are consistent with abnormal left ventricular relaxation (grade 1 diastolic dysfunction). - Aortic valve: Trileaflet; mildly thickened, mildly calcified leaflets. 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 There was mild regurgitation. Peak velocity (S): 214 cm/s. Mean gradient (S): 9 mm Hg. Valve area (VTI): 1.1 cm^2. Valve area (Vmax): 1.13 cm^2. - Mitral valve: There was mild regurgitation. - Left atrium: The atrium was mildly dilated. - Right ventricle: Systolic function was mildly to moderatelyreduced. - Right atrium: The atrium was mildly dilated. - Pulmonary arteries: Systolic pressure was mildly increased. PApeak pressure: 48 mm Hg (S). - Pericardium, extracardiac: There was a left pleural effusion.   Neuro/Psych CVA    GI/Hepatic   Endo/Other    Renal/GU Renal InsufficiencyRenal disease     Musculoskeletal   Abdominal   Peds  Hematology   Anesthesia Other Findings   Reproductive/Obstetrics                           Anesthesia Physical Anesthesia Plan  ASA: IV  Anesthesia Plan: General   Post-op Pain Management:    Induction: Intravenous  Airway Management Planned: Oral ETT  Additional Equipment: Arterial line, CVP, TEE, PA Cath, Ultrasound Guidance Line Placement and 3D TEE  Intra-op Plan:   Post-operative Plan: Post-operative intubation/ventilation  Informed Consent: I have reviewed the patients History and Physical, chart, labs and discussed the procedure including the risks, benefits and alternatives for the proposed anesthesia with the patient or authorized representative who has indicated his/her understanding and acceptance.     Plan Discussed with: Anesthesiologist, Surgeon and CRNA  Anesthesia Plan Comments:        Anesthesia Quick Evaluation

## 2015-09-23 ENCOUNTER — Encounter (HOSPITAL_COMMUNITY): Payer: Self-pay | Admitting: Critical Care Medicine

## 2015-09-23 ENCOUNTER — Inpatient Hospital Stay (HOSPITAL_COMMUNITY): Payer: Medicare Other | Admitting: Critical Care Medicine

## 2015-09-23 ENCOUNTER — Inpatient Hospital Stay (HOSPITAL_COMMUNITY): Payer: Medicare Other

## 2015-09-23 ENCOUNTER — Encounter (HOSPITAL_COMMUNITY)
Admission: AD | Disposition: E | Payer: Commercial Indemnity | Source: Other Acute Inpatient Hospital | Attending: Cardiothoracic Surgery

## 2015-09-23 DIAGNOSIS — I509 Heart failure, unspecified: Secondary | ICD-10-CM

## 2015-09-23 HISTORY — PX: INSERTION OF IMPLANTABLE LEFT VENTRICULAR ASSIST DEVICE: SHX5866

## 2015-09-23 HISTORY — PX: TEE WITHOUT CARDIOVERSION: SHX5443

## 2015-09-23 LAB — POCT I-STAT, CHEM 8
BUN: 15 mg/dL (ref 6–20)
BUN: 14 mg/dL (ref 6–20)
BUN: 13 mg/dL (ref 6–20)
BUN: 14 mg/dL (ref 6–20)
BUN: 15 mg/dL (ref 6–20)
BUN: 13 mg/dL (ref 6–20)
CALCIUM ION: 1.22 mmol/L (ref 1.13–1.30)
CALCIUM ION: 1.18 mmol/L (ref 1.13–1.30)
CHLORIDE: 113 mmol/L — AB (ref 101–111)
CHLORIDE: 116 mmol/L — AB (ref 101–111)
CREATININE: 1.2 mg/dL (ref 0.61–1.24)
CREATININE: 1.1 mg/dL (ref 0.61–1.24)
CREATININE: 1.2 mg/dL (ref 0.61–1.24)
Calcium, Ion: 1.03 mmol/L — ABNORMAL LOW (ref 1.13–1.30)
Calcium, Ion: 1.18 mmol/L (ref 1.13–1.30)
Calcium, Ion: 0.98 mmol/L — ABNORMAL LOW (ref 1.13–1.30)
Calcium, Ion: 1.08 mmol/L — ABNORMAL LOW (ref 1.13–1.30)
Chloride: 118 mmol/L — ABNORMAL HIGH (ref 101–111)
Chloride: 118 mmol/L — ABNORMAL HIGH (ref 101–111)
Chloride: 114 mmol/L — ABNORMAL HIGH (ref 101–111)
Chloride: 117 mmol/L — ABNORMAL HIGH (ref 101–111)
Creatinine, Ser: 1.2 mg/dL (ref 0.61–1.24)
Creatinine, Ser: 1 mg/dL (ref 0.61–1.24)
Creatinine, Ser: 1.1 mg/dL (ref 0.61–1.24)
GLUCOSE: 104 mg/dL — AB (ref 65–99)
Glucose, Bld: 113 mg/dL — ABNORMAL HIGH (ref 65–99)
Glucose, Bld: 116 mg/dL — ABNORMAL HIGH (ref 65–99)
Glucose, Bld: 117 mg/dL — ABNORMAL HIGH (ref 65–99)
Glucose, Bld: 162 mg/dL — ABNORMAL HIGH (ref 65–99)
Glucose, Bld: 121 mg/dL — ABNORMAL HIGH (ref 65–99)
HCT: 36 % — ABNORMAL LOW (ref 39.0–52.0)
HEMATOCRIT: 30 % — AB (ref 39.0–52.0)
HEMATOCRIT: 24 % — AB (ref 39.0–52.0)
HEMATOCRIT: 30 % — AB (ref 39.0–52.0)
HEMATOCRIT: 30 % — AB (ref 39.0–52.0)
HEMATOCRIT: 21 % — AB (ref 39.0–52.0)
HEMOGLOBIN: 12.2 g/dL — AB (ref 13.0–17.0)
HEMOGLOBIN: 10.2 g/dL — AB (ref 13.0–17.0)
HEMOGLOBIN: 10.2 g/dL — AB (ref 13.0–17.0)
HEMOGLOBIN: 8.2 g/dL — AB (ref 13.0–17.0)
HEMOGLOBIN: 10.2 g/dL — AB (ref 13.0–17.0)
HEMOGLOBIN: 7.1 g/dL — AB (ref 13.0–17.0)
POTASSIUM: 3.6 mmol/L (ref 3.5–5.1)
POTASSIUM: 3.1 mmol/L — AB (ref 3.5–5.1)
POTASSIUM: 3.3 mmol/L — AB (ref 3.5–5.1)
Potassium: 3.4 mmol/L — ABNORMAL LOW (ref 3.5–5.1)
Potassium: 3.2 mmol/L — ABNORMAL LOW (ref 3.5–5.1)
Potassium: 3.3 mmol/L — ABNORMAL LOW (ref 3.5–5.1)
SODIUM: 148 mmol/L — AB (ref 135–145)
SODIUM: 149 mmol/L — AB (ref 135–145)
SODIUM: 147 mmol/L — AB (ref 135–145)
SODIUM: 149 mmol/L — AB (ref 135–145)
Sodium: 150 mmol/L — ABNORMAL HIGH (ref 135–145)
Sodium: 149 mmol/L — ABNORMAL HIGH (ref 135–145)
TCO2: 23 mmol/L (ref 0–100)
TCO2: 22 mmol/L (ref 0–100)
TCO2: 22 mmol/L (ref 0–100)
TCO2: 22 mmol/L (ref 0–100)
TCO2: 23 mmol/L (ref 0–100)
TCO2: 21 mmol/L (ref 0–100)

## 2015-09-23 LAB — POCT I-STAT EG7
Acid-base deficit: 7 mmol/L — ABNORMAL HIGH (ref 0.0–2.0)
Bicarbonate: 18.1 mEq/L — ABNORMAL LOW (ref 20.0–24.0)
CALCIUM ION: 1.04 mmol/L — AB (ref 1.13–1.30)
HEMATOCRIT: 31 % — AB (ref 39.0–52.0)
Hemoglobin: 10.5 g/dL — ABNORMAL LOW (ref 13.0–17.0)
O2 Saturation: 88 %
POTASSIUM: 2.9 mmol/L — AB (ref 3.5–5.1)
SODIUM: 150 mmol/L — AB (ref 135–145)
TCO2: 19 mmol/L (ref 0–100)
pCO2, Ven: 31.1 mmHg — ABNORMAL LOW (ref 45.0–50.0)
pH, Ven: 7.366 — ABNORMAL HIGH (ref 7.250–7.300)
pO2, Ven: 51 mmHg — ABNORMAL HIGH (ref 30.0–45.0)

## 2015-09-23 LAB — CBC
HCT: 31.9 % — ABNORMAL LOW (ref 39.0–52.0)
HCT: 23.4 % — ABNORMAL LOW (ref 39.0–52.0)
HCT: 21.2 % — ABNORMAL LOW (ref 39.0–52.0)
Hemoglobin: 11.1 g/dL — ABNORMAL LOW (ref 13.0–17.0)
Hemoglobin: 8.5 g/dL — ABNORMAL LOW (ref 13.0–17.0)
Hemoglobin: 7.3 g/dL — ABNORMAL LOW (ref 13.0–17.0)
MCH: 30 pg (ref 26.0–34.0)
MCH: 30 pg (ref 26.0–34.0)
MCH: 29 pg (ref 26.0–34.0)
MCHC: 34.8 g/dL (ref 30.0–36.0)
MCHC: 36.3 g/dL — ABNORMAL HIGH (ref 30.0–36.0)
MCHC: 34.4 g/dL (ref 30.0–36.0)
MCV: 86.2 fL (ref 78.0–100.0)
MCV: 82.7 fL (ref 78.0–100.0)
MCV: 84.1 fL (ref 78.0–100.0)
PLATELETS: 254 10*3/uL (ref 150–400)
Platelets: 161 10*3/uL (ref 150–400)
Platelets: 148 10*3/uL — ABNORMAL LOW (ref 150–400)
RBC: 3.7 MIL/uL — AB (ref 4.22–5.81)
RBC: 2.83 MIL/uL — ABNORMAL LOW (ref 4.22–5.81)
RBC: 2.52 MIL/uL — ABNORMAL LOW (ref 4.22–5.81)
RDW: 14.3 % (ref 11.5–15.5)
RDW: 14.8 % (ref 11.5–15.5)
RDW: 15.2 % (ref 11.5–15.5)
WBC: 8.5 10*3/uL (ref 4.0–10.5)
WBC: 8.9 10*3/uL (ref 4.0–10.5)
WBC: 8.5 10*3/uL (ref 4.0–10.5)

## 2015-09-23 LAB — DIC (DISSEMINATED INTRAVASCULAR COAGULATION)PANEL
D-Dimer, Quant: 2.54 ug/mL-FEU — ABNORMAL HIGH (ref 0.00–0.50)
Fibrinogen: 378 mg/dL (ref 204–475)
INR: 1.45 (ref 0.00–1.49)
Platelets: 150 10*3/uL (ref 150–400)
Prothrombin Time: 17.7 seconds — ABNORMAL HIGH (ref 11.6–15.2)
Smear Review: NONE SEEN
aPTT: 38 seconds — ABNORMAL HIGH (ref 24–37)

## 2015-09-23 LAB — PREPARE RBC (CROSSMATCH)

## 2015-09-23 LAB — COMPREHENSIVE METABOLIC PANEL
ALT: 34 U/L (ref 17–63)
AST: 51 U/L — ABNORMAL HIGH (ref 15–41)
Albumin: 1.6 g/dL — ABNORMAL LOW (ref 3.5–5.0)
Alkaline Phosphatase: 97 U/L (ref 38–126)
Anion gap: 6 (ref 5–15)
BUN: 17 mg/dL (ref 6–20)
CO2: 19 mmol/L — ABNORMAL LOW (ref 22–32)
Calcium: 7.9 mg/dL — ABNORMAL LOW (ref 8.9–10.3)
Chloride: 119 mmol/L — ABNORMAL HIGH (ref 101–111)
Creatinine, Ser: 1.67 mg/dL — ABNORMAL HIGH (ref 0.61–1.24)
GFR calc Af Amer: 45 mL/min — ABNORMAL LOW (ref >60)
GFR calc non Af Amer: 39 mL/min — ABNORMAL LOW (ref >60)
Glucose, Bld: 140 mg/dL — ABNORMAL HIGH (ref 65–99)
Potassium: 3.5 mmol/L (ref 3.5–5.1)
Sodium: 144 mmol/L (ref 135–145)
Total Bilirubin: 0.5 mg/dL (ref 0.3–1.2)
Total Protein: 4.7 g/dL — ABNORMAL LOW (ref 6.5–8.1)

## 2015-09-23 LAB — POCT I-STAT 4, (NA,K, GLUC, HGB,HCT)
GLUCOSE: 126 mg/dL — AB (ref 65–99)
HCT: 27 % — ABNORMAL LOW (ref 39.0–52.0)
Hemoglobin: 9.2 g/dL — ABNORMAL LOW (ref 13.0–17.0)
POTASSIUM: 2.8 mmol/L — AB (ref 3.5–5.1)
SODIUM: 150 mmol/L — AB (ref 135–145)

## 2015-09-23 LAB — POCT I-STAT 3, ART BLOOD GAS (G3+)
ACID-BASE DEFICIT: 3 mmol/L — AB (ref 0.0–2.0)
ACID-BASE DEFICIT: 2 mmol/L (ref 0.0–2.0)
ACID-BASE DEFICIT: 5 mmol/L — AB (ref 0.0–2.0)
Acid-base deficit: 7 mmol/L — ABNORMAL HIGH (ref 0.0–2.0)
Acid-base deficit: 3 mmol/L — ABNORMAL HIGH (ref 0.0–2.0)
BICARBONATE: 18.3 meq/L — AB (ref 20.0–24.0)
BICARBONATE: 22.5 meq/L (ref 20.0–24.0)
BICARBONATE: 18.9 meq/L — AB (ref 20.0–24.0)
BICARBONATE: 21 meq/L (ref 20.0–24.0)
Bicarbonate: 22 mEq/L (ref 20.0–24.0)
O2 SAT: 100 %
O2 Saturation: 100 %
O2 Saturation: 100 %
O2 Saturation: 99 %
O2 Saturation: 100 %
PCO2 ART: 32.4 mmHg — AB (ref 35.0–45.0)
PCO2 ART: 30.9 mmHg — AB (ref 35.0–45.0)
PH ART: 7.361 (ref 7.350–7.450)
PH ART: 7.376 (ref 7.350–7.450)
PH ART: 7.431 (ref 7.350–7.450)
PO2 ART: 440 mmHg — AB (ref 80.0–100.0)
PO2 ART: 399 mmHg — AB (ref 80.0–100.0)
PO2 ART: 112 mmHg — AB (ref 80.0–100.0)
Patient temperature: 35.3
TCO2: 23 mmol/L (ref 0–100)
TCO2: 19 mmol/L (ref 0–100)
TCO2: 24 mmol/L (ref 0–100)
TCO2: 20 mmol/L (ref 0–100)
TCO2: 22 mmol/L (ref 0–100)
pCO2 arterial: 36.6 mmHg (ref 35.0–45.0)
pCO2 arterial: 38.4 mmHg (ref 35.0–45.0)
pCO2 arterial: 27.9 mmHg — ABNORMAL LOW (ref 35.0–45.0)
pH, Arterial: 7.387 (ref 7.350–7.450)
pH, Arterial: 7.438 (ref 7.350–7.450)
pO2, Arterial: 449 mmHg — ABNORMAL HIGH (ref 80.0–100.0)
pO2, Arterial: 182 mmHg — ABNORMAL HIGH (ref 80.0–100.0)

## 2015-09-23 LAB — BASIC METABOLIC PANEL
Anion gap: 8 (ref 5–15)
BUN: 13 mg/dL (ref 6–20)
CO2: 22 mmol/L (ref 22–32)
Calcium: 7.5 mg/dL — ABNORMAL LOW (ref 8.9–10.3)
Chloride: 118 mmol/L — ABNORMAL HIGH (ref 101–111)
Creatinine, Ser: 1.42 mg/dL — ABNORMAL HIGH (ref 0.61–1.24)
GFR calc Af Amer: 55 mL/min — ABNORMAL LOW (ref >60)
GFR calc non Af Amer: 47 mL/min — ABNORMAL LOW (ref >60)
Glucose, Bld: 114 mg/dL — ABNORMAL HIGH (ref 65–99)
Potassium: 2.9 mmol/L — ABNORMAL LOW (ref 3.5–5.1)
Sodium: 148 mmol/L — ABNORMAL HIGH (ref 135–145)

## 2015-09-23 LAB — CARBOXYHEMOGLOBIN
Carboxyhemoglobin: 1.5 % (ref 0.5–1.5)
Carboxyhemoglobin: 1.4 % (ref 0.5–1.5)
Methemoglobin: 0.9 % (ref 0.0–1.5)
Methemoglobin: 1.5 % (ref 0.0–1.5)
O2 Saturation: 78 %
O2 Saturation: 80.4 %
Total hemoglobin: 11.1 g/dL — ABNORMAL LOW (ref 13.5–18.0)
Total hemoglobin: 10 g/dL — ABNORMAL LOW (ref 13.5–18.0)

## 2015-09-23 LAB — PLATELET COUNT: Platelets: 117 10*3/uL — ABNORMAL LOW (ref 150–400)

## 2015-09-23 LAB — HEPARIN LEVEL (UNFRACTIONATED): HEPARIN UNFRACTIONATED: 0.24 [IU]/mL — AB (ref 0.30–0.70)

## 2015-09-23 LAB — MAGNESIUM: Magnesium: 1.8 mg/dL (ref 1.7–2.4)

## 2015-09-23 LAB — APTT: aPTT: 38 seconds — ABNORMAL HIGH (ref 24–37)

## 2015-09-23 LAB — HEMOGLOBIN AND HEMATOCRIT, BLOOD
HCT: 24.3 % — ABNORMAL LOW (ref 39.0–52.0)
Hemoglobin: 8.4 g/dL — ABNORMAL LOW (ref 13.0–17.0)

## 2015-09-23 LAB — PROTIME-INR
INR: 1.42 (ref 0.00–1.49)
Prothrombin Time: 17.4 seconds — ABNORMAL HIGH (ref 11.6–15.2)

## 2015-09-23 LAB — OCCULT BLOOD X 1 CARD TO LAB, STOOL: FECAL OCCULT BLD: POSITIVE — AB

## 2015-09-23 LAB — FIBRINOGEN: Fibrinogen: 367 mg/dL (ref 204–475)

## 2015-09-23 LAB — GLUCOSE, CAPILLARY: Glucose-Capillary: 111 mg/dL — ABNORMAL HIGH (ref 65–99)

## 2015-09-23 SURGERY — INSERTION OF IMPLANTABLE LEFT VENTRICULAR ASSIST DEVICE
Anesthesia: General | Site: Chest

## 2015-09-23 MED ORDER — MILRINONE IN DEXTROSE 20 MG/100ML IV SOLN
0.3000 ug/kg/min | INTRAVENOUS | Status: DC
  Administered 2015-09-23: 0.25 ug/kg/min via INTRAVENOUS
  Administered 2015-09-24 – 2015-09-30 (×9): 0.3 ug/kg/min via INTRAVENOUS
  Filled 2015-09-23 (×12): qty 100

## 2015-09-23 MED ORDER — CALCIUM CHLORIDE 10 % IV SOLN
INTRAVENOUS | Status: DC | PRN
  Administered 2015-09-23: 800 mg via INTRAVENOUS

## 2015-09-23 MED ORDER — INSULIN REGULAR BOLUS VIA INFUSION
0.0000 [IU] | Freq: Three times a day (TID) | INTRAVENOUS | Status: DC
  Filled 2015-09-23: qty 10

## 2015-09-23 MED ORDER — DEXMEDETOMIDINE HCL IN NACL 400 MCG/100ML IV SOLN
INTRAVENOUS | Status: DC | PRN
  Administered 2015-09-23: .2 ug/kg/h via INTRAVENOUS

## 2015-09-23 MED ORDER — NITROGLYCERIN IN D5W 200-5 MCG/ML-% IV SOLN: 0.0000 ug/min | INTRAVENOUS | Status: DC

## 2015-09-23 MED ORDER — ASPIRIN 81 MG PO CHEW
324.0000 mg | CHEWABLE_TABLET | Freq: Every day | ORAL | Status: DC
  Administered 2015-09-24: 324 mg
  Filled 2015-09-23: qty 4

## 2015-09-23 MED ORDER — ARTIFICIAL TEARS OP OINT
TOPICAL_OINTMENT | OPHTHALMIC | Status: DC | PRN
  Administered 2015-09-23: 1 via OPHTHALMIC

## 2015-09-23 MED ORDER — INSULIN ASPART 100 UNIT/ML ~~LOC~~ SOLN
0.0000 [IU] | SUBCUTANEOUS | Status: DC
  Administered 2015-09-23 – 2015-09-24 (×2): 2 [IU] via SUBCUTANEOUS

## 2015-09-23 MED ORDER — DEXMEDETOMIDINE HCL IN NACL 400 MCG/100ML IV SOLN
0.1000 ug/kg/h | INTRAVENOUS | Status: DC
  Administered 2015-09-23 – 2015-09-25 (×5): 0.7 ug/kg/h via INTRAVENOUS
  Filled 2015-09-23: qty 50
  Filled 2015-09-23 (×4): qty 100
  Filled 2015-09-23: qty 50

## 2015-09-23 MED ORDER — SODIUM CHLORIDE 0.9 % IV SOLN
250.0000 [IU] | INTRAVENOUS | Status: DC | PRN
  Administered 2015-09-23: 1.1 [IU]/h via INTRAVENOUS

## 2015-09-23 MED ORDER — DOCUSATE SODIUM 100 MG PO CAPS
200.0000 mg | ORAL_CAPSULE | Freq: Every day | ORAL | Status: DC
  Administered 2015-09-25 – 2015-10-09 (×8): 200 mg via ORAL
  Filled 2015-09-23 (×11): qty 2

## 2015-09-23 MED ORDER — MORPHINE SULFATE (PF) 2 MG/ML IV SOLN: 2.0000 mg | INTRAVENOUS | Status: DC | PRN

## 2015-09-23 MED ORDER — PANTOPRAZOLE SODIUM 40 MG PO TBEC
40.0000 mg | DELAYED_RELEASE_TABLET | Freq: Every day | ORAL | Status: DC
  Administered 2015-09-25 – 2015-10-09 (×15): 40 mg via ORAL
  Filled 2015-09-23 (×15): qty 1

## 2015-09-23 MED ORDER — SODIUM CHLORIDE 0.9 % IV SOLN
INTRAVENOUS | Status: DC | PRN
  Administered 2015-09-23: 3000 mL via INTRAMUSCULAR

## 2015-09-23 MED ORDER — SODIUM CHLORIDE 0.9 % IJ SOLN: 3.0000 mL | Freq: Two times a day (BID) | INTRAMUSCULAR | Status: DC

## 2015-09-23 MED ORDER — DEXTROSE 5 % IV SOLN
1.5000 g | INTRAVENOUS | Status: DC | PRN
  Administered 2015-09-23: 1.5 g via INTRAVENOUS
  Administered 2015-09-23: .75 g via INTRAVENOUS

## 2015-09-23 MED ORDER — ALBUMIN HUMAN 5 % IV SOLN
250.0000 mL | INTRAVENOUS | Status: AC | PRN
  Administered 2015-09-23 (×2): 250 mL via INTRAVENOUS
  Filled 2015-09-23: qty 250

## 2015-09-23 MED ORDER — NOREPINEPHRINE BITARTRATE 1 MG/ML IV SOLN
4000.0000 ug | INTRAVENOUS | Status: DC | PRN
  Administered 2015-09-23: 2 ug/min via INTRAVENOUS

## 2015-09-23 MED ORDER — TRAMADOL HCL 50 MG PO TABS: 50.0000 mg | ORAL_TABLET | ORAL | Status: DC | PRN

## 2015-09-23 MED ORDER — FLUCONAZOLE IN SODIUM CHLORIDE 400-0.9 MG/200ML-% IV SOLN
400.0000 mg | Freq: Once | INTRAVENOUS | Status: AC
  Administered 2015-09-24: 400 mg via INTRAVENOUS
  Filled 2015-09-23: qty 200

## 2015-09-23 MED ORDER — ROCURONIUM BROMIDE 50 MG/5ML IV SOLN
INTRAVENOUS | Status: AC
  Filled 2015-09-23: qty 1

## 2015-09-23 MED ORDER — VANCOMYCIN HCL IN DEXTROSE 1-5 GM/200ML-% IV SOLN
1000.0000 mg | Freq: Two times a day (BID) | INTRAVENOUS | Status: DC
  Administered 2015-09-23: 1000 mg via INTRAVENOUS
  Filled 2015-09-23 (×3): qty 200

## 2015-09-23 MED ORDER — SODIUM CHLORIDE 0.9 % IJ SOLN
INTRAMUSCULAR | Status: AC
  Filled 2015-09-23: qty 10

## 2015-09-23 MED ORDER — PHENYLEPHRINE HCL 10 MG/ML IJ SOLN
INTRAMUSCULAR | Status: DC | PRN
  Administered 2015-09-23: 80 ug via INTRAVENOUS
  Administered 2015-09-23 (×2): 40 ug via INTRAVENOUS
  Administered 2015-09-23 (×3): 80 ug via INTRAVENOUS

## 2015-09-23 MED ORDER — MAGNESIUM SULFATE 4 GM/100ML IV SOLN
4.0000 g | Freq: Once | INTRAVENOUS | Status: AC
  Administered 2015-09-23: 4 g via INTRAVENOUS
  Filled 2015-09-23: qty 100

## 2015-09-23 MED ORDER — HEMOSTATIC AGENTS (NO CHARGE) OPTIME
TOPICAL | Status: DC | PRN
  Administered 2015-09-23: 1 via TOPICAL

## 2015-09-23 MED ORDER — ASPIRIN 300 MG RE SUPP
300.0000 mg | Freq: Every day | RECTAL | Status: DC
  Filled 2015-09-23 (×3): qty 1

## 2015-09-23 MED ORDER — SODIUM CHLORIDE 0.9 % IV SOLN
Freq: Once | INTRAVENOUS | Status: AC
  Administered 2015-09-23: 23:00:00 via INTRAVENOUS

## 2015-09-23 MED ORDER — ARTIFICIAL TEARS OP OINT
TOPICAL_OINTMENT | OPHTHALMIC | Status: AC
  Filled 2015-09-23: qty 3.5

## 2015-09-23 MED ORDER — MIDAZOLAM HCL 2 MG/2ML IJ SOLN
2.0000 mg | INTRAMUSCULAR | Status: DC | PRN
  Filled 2015-09-23: qty 2

## 2015-09-23 MED ORDER — CHLORHEXIDINE GLUCONATE 0.12% ORAL RINSE (MEDLINE KIT)
15.0000 mL | Freq: Two times a day (BID) | OROMUCOSAL | Status: DC
  Administered 2015-09-23 – 2015-09-24 (×2): 15 mL via OROMUCOSAL

## 2015-09-23 MED ORDER — BISACODYL 10 MG RE SUPP
10.0000 mg | Freq: Every day | RECTAL | Status: DC
  Administered 2015-09-24: 10 mg via RECTAL
  Filled 2015-09-23: qty 1

## 2015-09-23 MED ORDER — OXYCODONE HCL 5 MG PO TABS
5.0000 mg | ORAL_TABLET | ORAL | Status: DC | PRN
  Administered 2015-10-02 – 2015-10-08 (×2): 5 mg via ORAL
  Filled 2015-09-23 (×3): qty 1

## 2015-09-23 MED ORDER — SODIUM CHLORIDE 0.9 % IV SOLN
250.0000 mL | INTRAVENOUS | Status: DC
  Administered 2015-09-26: 500 mL via INTRAVENOUS
  Administered 2015-09-29: 20 mL via INTRAVENOUS
  Administered 2015-09-30: 250 mL via INTRAVENOUS
  Administered 2015-10-04 – 2015-10-05 (×2): via INTRAVENOUS
  Administered 2015-10-09: 250 mL via INTRAVENOUS

## 2015-09-23 MED ORDER — EPINEPHRINE HCL 1 MG/ML IJ SOLN
2.0000 ug/min | INTRAVENOUS | Status: DC
  Administered 2015-09-23: 4 ug/min via INTRAVENOUS
  Administered 2015-09-25 – 2015-09-26 (×2): 2 ug/min via INTRAVENOUS
  Filled 2015-09-23 (×5): qty 4

## 2015-09-23 MED ORDER — FLUCONAZOLE IN SODIUM CHLORIDE 400-0.9 MG/200ML-% IV SOLN
INTRAVENOUS | Status: DC | PRN
  Administered 2015-09-23: 400 mg via INTRAVENOUS

## 2015-09-23 MED ORDER — ASPIRIN EC 325 MG PO TBEC
325.0000 mg | DELAYED_RELEASE_TABLET | Freq: Every day | ORAL | Status: DC
  Administered 2015-09-25 – 2015-09-26 (×2): 325 mg via ORAL
  Filled 2015-09-23 (×2): qty 1

## 2015-09-23 MED ORDER — PHENYLEPHRINE HCL 10 MG/ML IJ SOLN
0.0000 ug/min | INTRAVENOUS | Status: DC
  Filled 2015-09-23: qty 2

## 2015-09-23 MED ORDER — MIDAZOLAM HCL 5 MG/5ML IJ SOLN
INTRAMUSCULAR | Status: DC | PRN
  Administered 2015-09-23: 2 mg via INTRAVENOUS
  Administered 2015-09-23: 4 mg via INTRAVENOUS

## 2015-09-23 MED ORDER — 0.9 % SODIUM CHLORIDE (POUR BTL) OPTIME
TOPICAL | Status: DC | PRN
  Administered 2015-09-23: 1000 mL

## 2015-09-23 MED ORDER — SODIUM CHLORIDE 0.9 % IV SOLN: INTRAVENOUS | Status: DC

## 2015-09-23 MED ORDER — LACTATED RINGERS IV SOLN
INTRAVENOUS | Status: DC
  Administered 2015-09-23: 20 mL/h via INTRAVENOUS

## 2015-09-23 MED ORDER — HEPARIN SODIUM (PORCINE) 1000 UNIT/ML IJ SOLN
INTRAMUSCULAR | Status: DC | PRN
  Administered 2015-09-23: 30000 [IU] via INTRAVENOUS

## 2015-09-23 MED ORDER — ACETAMINOPHEN 650 MG RE SUPP
650.0000 mg | Freq: Once | RECTAL | Status: AC
  Administered 2015-09-23: 650 mg via RECTAL

## 2015-09-23 MED ORDER — ACETAMINOPHEN 160 MG/5ML PO SOLN: 650.0000 mg | Freq: Once | ORAL | Status: AC

## 2015-09-23 MED ORDER — VANCOMYCIN HCL 1000 MG IV SOLR
INTRAVENOUS | Status: AC
  Filled 2015-09-23: qty 2000

## 2015-09-23 MED ORDER — LACTATED RINGERS IV SOLN
INTRAVENOUS | Status: DC | PRN
  Administered 2015-09-23: 09:00:00 via INTRAVENOUS

## 2015-09-23 MED ORDER — VANCOMYCIN HCL 1000 MG IV SOLR
INTRAVENOUS | Status: DC | PRN
  Administered 2015-09-23 (×2): 1000 mg

## 2015-09-23 MED ORDER — HEPARIN SODIUM (PORCINE) 1000 UNIT/ML IJ SOLN
INTRAMUSCULAR | Status: AC
  Filled 2015-09-23: qty 1

## 2015-09-23 MED ORDER — SUCCINYLCHOLINE CHLORIDE 20 MG/ML IJ SOLN
INTRAMUSCULAR | Status: AC
  Filled 2015-09-23: qty 1

## 2015-09-23 MED ORDER — FAMOTIDINE IN NACL 20-0.9 MG/50ML-% IV SOLN
20.0000 mg | Freq: Two times a day (BID) | INTRAVENOUS | Status: AC
  Administered 2015-09-23 – 2015-09-24 (×2): 20 mg via INTRAVENOUS
  Filled 2015-09-23: qty 50

## 2015-09-23 MED ORDER — MORPHINE SULFATE (PF) 2 MG/ML IV SOLN: 1.0000 mg | INTRAVENOUS | Status: AC | PRN

## 2015-09-23 MED ORDER — ROCURONIUM BROMIDE 100 MG/10ML IV SOLN
INTRAVENOUS | Status: DC | PRN
  Administered 2015-09-23 (×4): 50 mg via INTRAVENOUS

## 2015-09-23 MED ORDER — EPINEPHRINE HCL 1 MG/ML IJ SOLN
4000.0000 ug | INTRAVENOUS | Status: DC | PRN
  Administered 2015-09-23: 2 ug/min via INTRAVENOUS

## 2015-09-23 MED ORDER — SODIUM CHLORIDE 0.9 % IV SOLN
Freq: Once | INTRAVENOUS | Status: DC
Start: 2015-09-23 — End: 2015-09-23
  Administered 2015-09-23: 16:00:00 via INTRAVENOUS

## 2015-09-23 MED ORDER — SODIUM CHLORIDE 0.9 % IV SOLN
20.0000 ug | INTRAVENOUS | Status: AC
  Administered 2015-09-23: 20 ug via INTRAVENOUS
  Filled 2015-09-23: qty 5

## 2015-09-23 MED ORDER — POTASSIUM CHLORIDE 10 MEQ/50ML IV SOLN
10.0000 meq | INTRAVENOUS | Status: AC
  Administered 2015-09-23 (×3): 10 meq via INTRAVENOUS

## 2015-09-23 MED ORDER — POTASSIUM CHLORIDE 10 MEQ/50ML IV SOLN
10.0000 meq | Freq: Once | INTRAVENOUS | Status: AC
  Administered 2015-09-23: 10 meq via INTRAVENOUS

## 2015-09-23 MED ORDER — ANTISEPTIC ORAL RINSE SOLUTION (CORINZ)
7.0000 mL | Freq: Four times a day (QID) | OROMUCOSAL | Status: DC
  Administered 2015-09-24 – 2015-09-25 (×6): 7 mL via OROMUCOSAL

## 2015-09-23 MED ORDER — EPHEDRINE SULFATE 50 MG/ML IJ SOLN
INTRAMUSCULAR | Status: AC
  Filled 2015-09-23: qty 1

## 2015-09-23 MED ORDER — NOREPINEPHRINE BITARTRATE 1 MG/ML IV SOLN
0.0000 ug/min | INTRAVENOUS | Status: DC
  Administered 2015-09-24: 3 ug/min via INTRAVENOUS
  Administered 2015-09-25: 2 ug/min via INTRAVENOUS
  Filled 2015-09-23 (×4): qty 4

## 2015-09-23 MED ORDER — ROCURONIUM BROMIDE 50 MG/5ML IV SOLN
INTRAVENOUS | Status: AC
  Filled 2015-09-23: qty 3

## 2015-09-23 MED ORDER — ACETAMINOPHEN 160 MG/5ML PO SOLN
1000.0000 mg | Freq: Four times a day (QID) | ORAL | Status: AC
  Administered 2015-09-23 – 2015-09-28 (×11): 1000 mg
  Filled 2015-09-23 (×10): qty 40.6

## 2015-09-23 MED ORDER — CHLORHEXIDINE GLUCONATE 0.12 % MT SOLN
15.0000 mL | OROMUCOSAL | Status: AC
  Administered 2015-09-23: 15 mL via OROMUCOSAL

## 2015-09-23 MED ORDER — SODIUM CHLORIDE 0.9 % IR SOLN
Status: DC | PRN
  Administered 2015-09-23: 6000 mL

## 2015-09-23 MED ORDER — LIDOCAINE HCL (CARDIAC) 20 MG/ML IV SOLN
INTRAVENOUS | Status: AC
  Filled 2015-09-23: qty 5

## 2015-09-23 MED ORDER — SODIUM CHLORIDE 0.9 % IJ SOLN
INTRAMUSCULAR | Status: AC
  Filled 2015-09-23: qty 20

## 2015-09-23 MED ORDER — SODIUM CHLORIDE 0.45 % IV SOLN
INTRAVENOUS | Status: DC | PRN
Start: 2015-09-23 — End: 2015-10-16
  Administered 2015-09-24: 22:00:00 via INTRAVENOUS

## 2015-09-23 MED ORDER — SODIUM CHLORIDE 0.9 % IV SOLN
10.0000 g | INTRAVENOUS | Status: DC | PRN
  Administered 2015-09-23: 5 g/h via INTRAVENOUS

## 2015-09-23 MED ORDER — MIDAZOLAM HCL 10 MG/2ML IJ SOLN
INTRAMUSCULAR | Status: AC
  Filled 2015-09-23: qty 2

## 2015-09-23 MED ORDER — EPHEDRINE SULFATE 50 MG/ML IJ SOLN
INTRAMUSCULAR | Status: DC | PRN
  Administered 2015-09-23 (×3): 5 mg via INTRAVENOUS
  Administered 2015-09-23: 10 mg via INTRAVENOUS

## 2015-09-23 MED ORDER — SODIUM CHLORIDE 0.9 % IJ SOLN
INTRAMUSCULAR | Status: DC | PRN
  Administered 2015-09-23 (×3): 4 mL via TOPICAL

## 2015-09-23 MED ORDER — ACETAMINOPHEN 500 MG PO TABS
1000.0000 mg | ORAL_TABLET | Freq: Four times a day (QID) | ORAL | Status: AC
  Administered 2015-09-26 – 2015-09-27 (×9): 1000 mg via ORAL
  Filled 2015-09-23 (×11): qty 2

## 2015-09-23 MED ORDER — ALBUMIN HUMAN 5 % IV SOLN
INTRAVENOUS | Status: DC | PRN
  Administered 2015-09-23 (×3): via INTRAVENOUS

## 2015-09-23 MED ORDER — ONDANSETRON HCL 4 MG/2ML IJ SOLN
4.0000 mg | Freq: Four times a day (QID) | INTRAMUSCULAR | Status: DC | PRN
  Administered 2015-10-07 – 2015-10-08 (×2): 4 mg via INTRAVENOUS
  Filled 2015-09-23 (×2): qty 2

## 2015-09-23 MED ORDER — BISACODYL 5 MG PO TBEC
10.0000 mg | DELAYED_RELEASE_TABLET | Freq: Every day | ORAL | Status: DC
  Administered 2015-09-25 – 2015-09-26 (×2): 10 mg via ORAL
  Filled 2015-09-23 (×2): qty 2

## 2015-09-23 MED ORDER — FENTANYL CITRATE (PF) 100 MCG/2ML IJ SOLN
INTRAMUSCULAR | Status: DC | PRN
  Administered 2015-09-23: 250 ug via INTRAVENOUS
  Administered 2015-09-23: 150 ug via INTRAVENOUS
  Administered 2015-09-23: 250 ug via INTRAVENOUS
  Administered 2015-09-23: 100 ug via INTRAVENOUS
  Administered 2015-09-23: 50 ug via INTRAVENOUS
  Administered 2015-09-23 (×2): 100 ug via INTRAVENOUS

## 2015-09-23 MED ORDER — PROTAMINE SULFATE 10 MG/ML IV SOLN
INTRAVENOUS | Status: AC
Start: 2015-09-23 — End: 2015-09-23
  Filled 2015-09-23: qty 5

## 2015-09-23 MED ORDER — LACTATED RINGERS IV SOLN: INTRAVENOUS | Status: DC

## 2015-09-23 MED ORDER — RIFAMPIN 300 MG PO CAPS
600.0000 mg | ORAL_CAPSULE | Freq: Once | ORAL | Status: AC
  Administered 2015-09-24: 600 mg via ORAL
  Filled 2015-09-23 (×2): qty 2

## 2015-09-23 MED ORDER — PROTAMINE SULFATE 10 MG/ML IV SOLN
INTRAVENOUS | Status: DC | PRN
  Administered 2015-09-23: 20 mg via INTRAVENOUS
  Administered 2015-09-23: 280 mg via INTRAVENOUS

## 2015-09-23 MED ORDER — METOCLOPRAMIDE HCL 5 MG/ML IJ SOLN
10.0000 mg | Freq: Four times a day (QID) | INTRAMUSCULAR | Status: DC
  Administered 2015-09-23 – 2015-09-25 (×7): 10 mg via INTRAVENOUS
  Filled 2015-09-23 (×6): qty 2

## 2015-09-23 MED ORDER — PROTAMINE SULFATE 10 MG/ML IV SOLN
INTRAVENOUS | Status: AC
  Filled 2015-09-23: qty 25

## 2015-09-23 MED ORDER — FENTANYL CITRATE (PF) 250 MCG/5ML IJ SOLN
INTRAMUSCULAR | Status: AC
  Filled 2015-09-23: qty 20

## 2015-09-23 MED ORDER — SODIUM CHLORIDE 0.9 % IV SOLN
INTRAVENOUS | Status: DC
  Administered 2015-09-23: 0.5 [IU]/h via INTRAVENOUS
  Filled 2015-09-23: qty 2.5

## 2015-09-23 MED ORDER — SODIUM BICARBONATE 8.4 % IV SOLN
50.0000 meq | Freq: Once | INTRAVENOUS | Status: AC
  Administered 2015-09-23: 50 meq via INTRAVENOUS

## 2015-09-23 MED ORDER — LACTATED RINGERS IV SOLN: 500.0000 mL | Freq: Once | INTRAVENOUS | Status: DC | PRN

## 2015-09-23 MED ORDER — SODIUM CHLORIDE 0.9 % IJ SOLN: 3.0000 mL | INTRAMUSCULAR | Status: DC | PRN

## 2015-09-23 MED FILL — Potassium Chloride Inj 2 mEq/ML: INTRAVENOUS | Qty: 40 | Status: AC

## 2015-09-23 MED FILL — Heparin Sodium (Porcine) Inj 1000 Unit/ML: INTRAMUSCULAR | Qty: 30 | Status: AC

## 2015-09-23 MED FILL — Magnesium Sulfate Inj 50%: INTRAMUSCULAR | Qty: 10 | Status: AC

## 2015-09-23 SURGICAL SUPPLY — 114 items
ADAPTER DLP PERFUSION .25INX2I (MISCELLANEOUS) ×4 IMPLANT
APPLICATOR TIP COSEAL (VASCULAR PRODUCTS) ×16 IMPLANT
ATTRACTOMAT 16X20 MAGNETIC DRP (DRAPES) ×4 IMPLANT
BAG DECANTER FOR FLEXI CONT (MISCELLANEOUS) ×12 IMPLANT
BLADE STERNUM SYSTEM 6 (BLADE) ×4 IMPLANT
BLADE SURG 12 STRL SS (BLADE) ×8 IMPLANT
CANISTER SUCTION 2500CC (MISCELLANEOUS) ×4 IMPLANT
CANNULA ARTERIAL NVNT 3/8 20FR (MISCELLANEOUS) ×4 IMPLANT
CANNULA SUMP PERICARDIAL (CANNULA) ×4 IMPLANT
CANNULA VENOUS LOW PROF 34X46 (CANNULA) ×4 IMPLANT
CATH FOLEY 2WAY SLVR  5CC 14FR (CATHETERS) ×4
CATH FOLEY 2WAY SLVR 5CC 14FR (CATHETERS) ×4 IMPLANT
CATH HYDRAGLIDE XL THORACIC (CATHETERS) ×4 IMPLANT
CATH ROBINSON RED A/P 18FR (CATHETERS) ×8 IMPLANT
CATH THORACIC 28FR (CATHETERS) IMPLANT
CATH THORACIC 36FR RT ANG (CATHETERS) IMPLANT
CHLORAPREP W/TINT 26ML (MISCELLANEOUS) ×4 IMPLANT
CONN ST 1/4X3/8  BEN (MISCELLANEOUS)
CONN ST 1/4X3/8 BEN (MISCELLANEOUS) IMPLANT
COVER SURGICAL LIGHT HANDLE (MISCELLANEOUS) ×4 IMPLANT
CRADLE DONUT ADULT HEAD (MISCELLANEOUS) ×4 IMPLANT
DERMABOND ADVANCED (GAUZE/BANDAGES/DRESSINGS) ×2
DERMABOND ADVANCED .7 DNX12 (GAUZE/BANDAGES/DRESSINGS) ×2 IMPLANT
DRAIN CHANNEL 28F RND 3/8 FF (WOUND CARE) IMPLANT
DRAIN CHANNEL 32F RND 10.7 FF (WOUND CARE) IMPLANT
DRAPE BILATERAL SPLIT (DRAPES) ×4 IMPLANT
DRAPE CV SPLIT W-CLR ANES SCRN (DRAPES) ×4 IMPLANT
DRAPE INCISE IOBAN 66X45 STRL (DRAPES) ×4 IMPLANT
DRAPE PERI GROIN 82X75IN TIB (DRAPES) ×4 IMPLANT
DRAPE PROXIMA HALF (DRAPES) ×4 IMPLANT
DRAPE SLUSH/WARMER DISC (DRAPES) ×4 IMPLANT
DRSG AQUACEL AG ADV 3.5X14 (GAUZE/BANDAGES/DRESSINGS) ×4 IMPLANT
ELECT BLADE 4.0 EZ CLEAN MEGAD (MISCELLANEOUS) ×4
ELECT BLADE 6.5 EXT (BLADE) ×4 IMPLANT
ELECT CAUTERY BLADE 6.4 (BLADE) ×4 IMPLANT
ELECT REM PT RETURN 9FT ADLT (ELECTROSURGICAL) ×4
ELECTRODE BLDE 4.0 EZ CLN MEGD (MISCELLANEOUS) ×2 IMPLANT
ELECTRODE REM PT RTRN 9FT ADLT (ELECTROSURGICAL) ×2 IMPLANT
FELT TEFLON 6X6 (MISCELLANEOUS) ×4 IMPLANT
GAUZE SPONGE 4X4 12PLY STRL (GAUZE/BANDAGES/DRESSINGS) ×8 IMPLANT
GLOVE BIO SURGEON STRL SZ 6 (GLOVE) ×40 IMPLANT
GLOVE BIO SURGEON STRL SZ 6.5 (GLOVE) ×12 IMPLANT
GLOVE BIO SURGEON STRL SZ7 (GLOVE) ×20 IMPLANT
GLOVE BIO SURGEON STRL SZ7.5 (GLOVE) ×16 IMPLANT
GLOVE BIO SURGEONS STRL SZ 6.5 (GLOVE) ×4
GLOVE BIOGEL PI IND STRL 6 (GLOVE) ×12 IMPLANT
GLOVE BIOGEL PI IND STRL 6.5 (GLOVE) ×8 IMPLANT
GLOVE BIOGEL PI IND STRL 7.0 (GLOVE) ×4 IMPLANT
GLOVE BIOGEL PI INDICATOR 6 (GLOVE) ×12
GLOVE BIOGEL PI INDICATOR 6.5 (GLOVE) ×8
GLOVE BIOGEL PI INDICATOR 7.0 (GLOVE) ×4
GLOVE EUDERMIC 7 POWDERFREE (GLOVE) ×4 IMPLANT
GOWN STRL REUS W/ TWL LRG LVL3 (GOWN DISPOSABLE) ×8 IMPLANT
GOWN STRL REUS W/ TWL XL LVL3 (GOWN DISPOSABLE) ×4 IMPLANT
GOWN STRL REUS W/TWL LRG LVL3 (GOWN DISPOSABLE) ×8
GOWN STRL REUS W/TWL XL LVL3 (GOWN DISPOSABLE) ×4
HEMOSTAT POWDER SURGIFOAM 1G (HEMOSTASIS) ×16 IMPLANT
HM II VENTR. ASSIST DEVICE BP (Prosthesis & Implant Heart) ×4 IMPLANT
KIT BASIN OR (CUSTOM PROCEDURE TRAY) ×4 IMPLANT
KIT ROOM TURNOVER OR (KITS) ×4 IMPLANT
KIT SUCTION CATH 14FR (SUCTIONS) ×4 IMPLANT
LINE VENT (MISCELLANEOUS) ×4 IMPLANT
NDL SUT 1 .5 CRC FRENCH EYE (NEEDLE) ×2 IMPLANT
NEEDLE FRENCH EYE (NEEDLE) ×2
NS IRRIG 1000ML POUR BTL (IV SOLUTION) ×24 IMPLANT
PACK OPEN HEART (CUSTOM PROCEDURE TRAY) ×4 IMPLANT
PAD ARMBOARD 7.5X6 YLW CONV (MISCELLANEOUS) ×8 IMPLANT
PAD DEFIB R2 (MISCELLANEOUS) ×4 IMPLANT
PUNCH AORTIC ROTATE 4.5MM 8IN (MISCELLANEOUS) ×4 IMPLANT
SEALANT SURG COSEAL 8ML (VASCULAR PRODUCTS) ×4 IMPLANT
SET CARDIOPLEGIA MPS 5001102 (MISCELLANEOUS) ×4 IMPLANT
SPONGE GAUZE 4X4 12PLY STER LF (GAUZE/BANDAGES/DRESSINGS) ×8 IMPLANT
STOPCOCK 4 WAY LG BORE MALE ST (IV SETS) ×4 IMPLANT
SUCKER INTRACARDIAC WEIGHTED (SUCKER) ×4 IMPLANT
SUT ETHIBOND 2 0 SH (SUTURE) ×10
SUT ETHIBOND 2 0 SH 36X2 (SUTURE) ×10 IMPLANT
SUT ETHIBOND 5 LR DA (SUTURE) ×8 IMPLANT
SUT ETHIBOND NAB MH 2-0 36IN (SUTURE) ×48 IMPLANT
SUT ETHILON 3 0 FSL (SUTURE) ×4 IMPLANT
SUT PROLENE 3 0 SH DA (SUTURE) ×8 IMPLANT
SUT PROLENE 4 0 RB 1 (SUTURE) ×12
SUT PROLENE 4-0 RB1 .5 CRCL 36 (SUTURE) ×12 IMPLANT
SUT PROLENE 5 0 C 1 36 (SUTURE) ×8 IMPLANT
SUT PROLENE 6 0 C 1 30 (SUTURE) ×8 IMPLANT
SUT SILK  1 MH (SUTURE) ×10
SUT SILK 1 MH (SUTURE) ×10 IMPLANT
SUT SILK 1 TIES 10X30 (SUTURE) ×4 IMPLANT
SUT SILK 2 0 SH CR/8 (SUTURE) ×8 IMPLANT
SUT SILK 3 0 SH CR/8 (SUTURE) ×8 IMPLANT
SUT STEEL 6MS V (SUTURE) ×4 IMPLANT
SUT STEEL SZ 6 DBL 3X14 BALL (SUTURE) ×4 IMPLANT
SUT TEM PAC WIRE 2 0 SH (SUTURE) ×8 IMPLANT
SUT VIC AB 1 CTX 18 (SUTURE) ×8 IMPLANT
SUT VIC AB 1 CTX 36 (SUTURE) ×10
SUT VIC AB 1 CTX36XBRD ANBCTR (SUTURE) ×10 IMPLANT
SUT VIC AB 2-0 CT1 27 (SUTURE) ×2
SUT VIC AB 2-0 CT1 TAPERPNT 27 (SUTURE) ×2 IMPLANT
SUT VIC AB 2-0 CTX 27 (SUTURE) ×8 IMPLANT
SUT VIC AB 3-0 SH 8-18 (SUTURE) ×12 IMPLANT
SUT VIC AB 3-0 X1 27 (SUTURE) ×12 IMPLANT
SYR 50ML LL SCALE MARK (SYRINGE) ×4 IMPLANT
SYSTEM SAHARA CHEST DRAIN ATS (WOUND CARE) ×8 IMPLANT
TAPE CLOTH SURG 4X10 WHT LF (GAUZE/BANDAGES/DRESSINGS) ×4 IMPLANT
TAPE CLOTH SURG 6X10 WHT LF (GAUZE/BANDAGES/DRESSINGS) ×4 IMPLANT
TAPE PAPER 3X10 WHT MICROPORE (GAUZE/BANDAGES/DRESSINGS) ×4 IMPLANT
TOWEL OR 17X24 6PK STRL BLUE (TOWEL DISPOSABLE) ×8 IMPLANT
TOWEL OR 17X26 10 PK STRL BLUE (TOWEL DISPOSABLE) ×8 IMPLANT
TRAY CATH LUMEN 1 20CM STRL (SET/KITS/TRAYS/PACK) ×4 IMPLANT
TRAY FOLEY IC TEMP SENS 14FR (CATHETERS) ×4 IMPLANT
TUBE CONNECTING 12'X1/4 (SUCTIONS) ×1
TUBE CONNECTING 12X1/4 (SUCTIONS) ×3 IMPLANT
UNDERPAD 30X30 INCONTINENT (UNDERPADS AND DIAPERS) ×4 IMPLANT
WATER STERILE IRR 1000ML POUR (IV SOLUTION) ×8 IMPLANT
YANKAUER SUCT BULB TIP NO VENT (SUCTIONS) ×4 IMPLANT

## 2015-09-23 NOTE — Op Note (Signed)
Brad [name], Brad [name] NO.:  [id number]  MEDICAL RECORD NO.:  [id number]  LOCATION:  2S10C                        FACILITY:  [facility]  PHYSICIAN:  Kerin Perna, M.D.  DATE OF BIRTH:  1941-06-17  DATE OF PROCEDURE:  09/21/2015 DATE OF DISCHARGE:                              OPERATIVE REPORT   OPERATION: 1. Placement of HeartMate  Left    ventricular assist device.  SURGEON:  Kerin Perna, M.D.  ASSISTING SURGEON:  Evelene Croon, M.D.  ANESTHESIA:  General by Dr. Val Eagle.  PREOPERATIVE DIAGNOSES:  Cardiogenic shock on preoperative balloon pump, with long-standing nonischemic cardiomyopathy.  POSTOPERATIVE DIAGNOSIS:  Cardiogenic shock on preoperative balloon pump, with long-standing nonischemic cardiomyopathy.  CLINICAL NOTE:  The patient is a 74 year old Caucasian male, who was transferred from [facility] for advanced heart failure consultation, after the patient presented in low cardiac output and end- organ dysfunction.  The patient was evaluated and resuscitated by Dr. Gala Romney in the Advanced Heart Failure Service.  I reviewed the patient and personally reviewed his echocardiogram images and counseled the patient and his wife with the results of his evaluation.  The patient required placement of a pulmonary catheter with inotropes (both Milrinone and norepinephrine) and ultimately required a balloon pump placement to maintain adequate hemodynamics and to improve and order organ function. The patient was carefully evaluated by that the multi-disciplinary mechanical cardiac support service and felt to be an appropriate candidate for destination therapy and LVAD implantation.  I agreed with that recommendation and discussed the procedure in detail with the patient, including location of the surgical incision, the use of general anesthesia and cardiopulmonary bypass, and the expected postoperative hospital recovery.  I  discussed with the patient risks to him of the operation as well as the risks of alternative therapies to surgery.  [name] understood that with implantation of the VAD, there will be risk for bleeding, blood transfusion requirement, infection, postoperative pulmonary problems including pleural effusion, postoperative stroke, MI, multi system failure, and death.  He demonstrated his understanding of these issues and agreed to proceed with surgery, under what I felt was an informed consent.  OPERATIVE FINDINGS: 1. Severe dilatation of the LV with thinning of the myocardium. 2. Only mild RV dysfunction without significant TR. 3. Intraoperative anemia, requiring 2 unit transfusion of packed blood     cells.  DESCRIPTION OF PROCEDURE:  The patient was brought to the operating room from preoperative holding and placed supine on the operating table, where general anesthesia was induced.  Invasive hemodynamic monitoring lines were placed as well as a transesophageal echo probe.  The echocardiogram confirmed his preoperative diagnosis of severe LV dysfunction with EF of less than 20%.  The patient was prepped and draped as a sterile field.  A proper time-out was performed.  A sternal incision was made and the sternum was divided with the oscillating saw. A small incision was made above the umbilicus and a small incision was made beneath the right costal margin for the power cord tunnel and the tunneling device were inserted.  The sternal retractors were placed for creation of the pump pocket, which was created by dissecting the posterior  rectus sheath off the peritoneum, extending it laterally to the chest wall and taking down the lateral portion of the left hemidiaphragm.  The peritoneal cavity was not entered.  The sternal retractor was placed and the pericardium was opened. Heparin was administered.  Pursestrings were placed in the ascending aorta and right atrium.  After the ACT was  documented as being therapeutic, the patient was cannulated.  The HeartMate II pump was inserted.  First, the outflow graft was sewn to the ascending aorta with a partial occluding clamp, placed beneath the aortic cannula.  This was sewn in a end-to-side fashion with running 4-0 Prolene and the suture line was covered with a light layer of medical adhesive-Coseal.  The partial occluding clamp was removed and the graft was clamped with a vascular clamp and gently laid to the side.  The patient was placed on cardiopulmonary bypass.  The LV was supported on laparotomy pads to expose the apex.  CO2 was insufflated into the operating field.  An 11 blade was placed into the LV apex and dilated with a tonsil clamp.  Through this, a 14-French Foley catheter was inserted and the LV inflated, with saline and the coring knife for the in-flow cannula was then used to core out the LV apex.  This was accomplished and the apex was sent to Pathology.  The trabecula in the LV were trimmed, so as not to and interfere with inflow into the VAD.  A 2-0 Ethibond pledgeted sutures were placed around the circumference of the apical opening using interconnected suture technique.  The sutures were then placed through the Dacron cuff of the silastic sleeve, used for the inflow cannula connection.  This inflow sleeve was then tied down with the individual sutures and checked to be secure without evidence of spacing 4 weeks.  This was covered with a light layer of a medical adhesive-Coseal.  As the patient was placed in a steep Trendelenburg and volume from the pump was used to fill the heart to remove any air, the inflow cannula of the HeartMate II pump was inserted in the silastic sleeves and secured with several heavy ties as well as a plastic tie band.  The pump was vented at the outflow elbow to keep the heart decompressed, but to avoid any entrapment of air.  After the inflow cannula was secure  the driveline was pulled through the abdominal wall and out the exit in the right upper quadrant.  This was then connected to the controller for the pump.  Next, while a Fine was still left in the heart, the outflow cannula was connected to the outflow elbow of the pump with care being taken to avoid entrapment of air.  This tightened nicely and a 20-gauge needle was placed in the graft, proximal to the vascular clamp, which was left on the pump.  The pump was then placed into the pump pocket and had a nice fit and configuration.  The lungs were expanded and inotropes were started.  Inhaled nitric oxide was started.  We then transition from cardiopulmonary bypass to the HeartMate II VAD flow.  The clamp on the outflow graft was removed after the heart was filled and the VAD.  Pump speeds were turned up from 6000 up to 8000 RPM's.  The patient was separated from cardiopulmonary bypass without difficulty on minimal inotropic support.  That flows were 4.5 L with adequate RV function.  The echo showed the septum to be midline and there was minimal ejection  through the aortic valve.  The patient was given protamine to reverse the heparin.  The venous cannula was removed.  More volume was given back to the patient from the pump and hemodynamics remained stable.  The patient completed the course of heparin, reversal with protamine without a reaction.  The patient still had significant diffuse coagulopathy from chronic heart failure and poor tissues and anasarca. He was given FFP and platelets with improved coagulation function.  The bend relief guard was placed around the outflow elbow.  A heavy suture of #2 Vicryl was placed in the abdominal wall and secured around the outflow elbow to keep the pump in a deep position in the pocket.  At this point, a sterile period time and effort were used to obtain hemostasis.  There was no significant bleeding for many of the surgical sites.   Gradually with electrocautery and meticulous attention to the sternal edges.  Hemostasis was adequate.  Bilateral pleural tubes were placed and brought out through separate incisions, after significant pleural effusions were drained bilaterally. The anterior mediastinal drain was brought out through a separate incision and a VAD pocket drain was brought out through a separate incision.  We then closed the sternum with interrupted steel wire.  The patient remained stable.  Pump speed was set at 8800 RPM's.  The pectoralis fascia was closed interrupted 0 Vicryl.  On the lower part of the incision and a running Vicryl in the upper mid part of the incision. The subcutaneous and skin layers were closed in running Vicryl.  The counter incision above the umbilicus.  Closed in layers using interrupted Vicryl for the subcutaneous layer and a running subcuticular suture for the skin.  The exit site was closed with interrupted 3-0 Vicryl for the subcutaneous and 3-0 nylon sutures for the skin.  Sterile dressings were then placed and the patient was prepared for transport to the ICU.  Total cardiopulmonary bypass time was 60 minutes.  The heart was not cross-clamped for this operation.     Kerin Perna, M.D.     PV/MEDQ  D:  09/13/2015  T:  08/30/2015  Job:  [id number]  cc:   Bevelyn Buckles. Bensimhon, MD

## 2015-09-23 NOTE — Progress Notes (Signed)
  Echocardiogram Echocardiogram Transesophageal has been performed.  Leta Jungling M 09/07/2015, 9:18 AM

## 2015-09-23 NOTE — Progress Notes (Signed)
Site area:RFA IABP catheter and sheath  Site Prior to Removal:  Level 0  Pressure Applied For:30 min Manual:   yes Patient Status During Pull:  stable Post Pull Site:  Level 0 Post Pull Instructions Given:  Intubated/sedated Post Pull Pulses Present: doppler Dressing Applied:  clear Bedrest begins @ 1730 Comments:removed by Marcello Moores

## 2015-09-23 NOTE — Anesthesia Procedure Notes (Addendum)
Procedure Name: Intubation Date/Time: 09/04/2015 8:48 AM Performed by: Glo Herring B Pre-anesthesia Checklist: Patient identified, Timeout performed, Emergency Drugs available, Suction available and Patient being monitored Patient Re-evaluated:Patient Re-evaluated prior to inductionOxygen Delivery Method: Circle system utilized Preoxygenation: Pre-oxygenation with 100% oxygen Intubation Type: IV induction and Cricoid Pressure applied Ventilation: Mask ventilation without difficulty Laryngoscope Size: Mac and 4 Grade View: Grade III Tube type: Subglottic suction tube Tube size: 7.5 mm Number of attempts: 2 Airway Equipment and Method: Stylet Placement Confirmation: CO2 detector,  positive ETCO2,  ETT inserted through vocal cords under direct vision and breath sounds checked- equal and bilateral Secured at: 23 cm Tube secured with: Tape Dental Injury: Teeth and Oropharynx as per pre-operative assessment    Central Venous Catheter Insertion Performed by: anesthesiologist Patient location: OR. Preanesthetic checklist: patient identified, IV checked, site marked, risks and benefits discussed, surgical consent, monitors and equipment checked, pre-op evaluation and timeout performed Position: Trendelenburg Landmarks identified and Seldinger technique used Catheter size: 9 Fr Central line was placed.MAC introducer Swan type and PA catheter depth:48PA Cath depth:48 Procedure performed using ultrasound guided technique. Attempts: 1 Following insertion, line sutured, dressing applied and Biopatch. Post procedure assessment: blood return through all ports, free fluid flow and no air.    Central Venous Catheter Insertion Performed by: anesthesiologist Patient location: OR. Preanesthetic checklist: patient identified, IV checked, site marked, risks and benefits discussed, surgical consent, monitors and equipment checked, pre-op evaluation and timeout performed Position: supine PA cath was  placed.Swan type and PA catheter depth:oximetry and 48PA Cath depth:48 Procedure performed without using ultrasound guided technique. Attempts: 1    Central Venous Catheter Insertion Performed by: anesthesiologist Patient location: OR. Preanesthetic checklist: patient identified, IV checked, site marked, risks and benefits discussed, surgical consent, monitors and equipment checked, pre-op evaluation, timeout performed and anesthesia consent Position: Trendelenburg Landmarks identified and Seldinger technique used Catheter size: 8 Fr Central line was placed.Double lumen Procedure performed using ultrasound guided technique. Attempts: 1 Following insertion, line sutured, dressing applied and Biopatch. Post procedure assessment: blood return through all ports, free fluid flow and no air.

## 2015-09-23 NOTE — Transfer of Care (Signed)
Immediate Anesthesia Transfer of Care Note  Patient: Brad [name]  Procedure(s) Performed: Procedure(s) with comments: INSERTION OF IMPLANTABLE LEFT VENTRICULAR ASSIST DEVICE (N/A) - CIRC ARREST  NITRIC OXIDE TRANSESOPHAGEAL ECHOCARDIOGRAM (TEE) (N/A)  Patient Location: SICU  Anesthesia Type:General  Level of Consciousness: Patient remains intubated per anesthesia plan  Airway & Oxygen Therapy: Patient remains intubated per anesthesia plan and Patient placed on Ventilator (see vital sign flow sheet for setting)  Post-op Assessment: Report given to RN and Post -op Vital signs reviewed and stable  Post vital signs: Reviewed and stable  Last Vitals:  Filed Vitals:   08/28/2015 0700 09/16/2015 0800  BP: 155/64 156/59  Pulse: 82 84  Temp:  36.9 C  Resp: 0 11   HR 90, RR 16, Sats 99% on ventilator, BP MAP 89 Complications: No apparent anesthesia complications

## 2015-09-23 NOTE — Progress Notes (Signed)
OT Cancellation Note  Patient Details Name: Brad [name] MRN: [id number] DOB: 27-Jan-1941   Cancelled Treatment:    Reason Eval/Treat Not Completed: Patient not medically ready - will initiate OT POD #2 (Fri) as per orders.  Angelene Giovanni Paxico, OTR/L [phone]  09/06/2015, 5:24 PM

## 2015-09-23 NOTE — Progress Notes (Signed)
CT surgery p.m. Rounds  Status post implantation of HeartMate 2 for end-stage heart failure and cardiogenic shock from nonischemic cardiomyopathy  Patient sedated on ventilator with stable hemodynamics Balloon pump has been removed without right groin hematoma Inotropic support is minimal  Plan slow wean of inhaled nitric oxide overnight in preparation for extubation tomorrow.

## 2015-09-23 NOTE — Progress Notes (Signed)
The patient was examined and preop studies reviewed. There has been no change from the prior exam and the patient is ready for surgery.  Plan LVAD implantation on L Ybarbo today for end stage heart failure with preop IABP

## 2015-09-23 NOTE — Brief Op Note (Signed)
09/01/2015 - 09/23/2015  3:01 PM  PATIENT:  Brad [name]  74 y.o. male  PRE-OPERATIVE DIAGNOSIS:  ICM  POST-OPERATIVE DIAGNOSIS:  ICM  PROCEDURE:  Procedure(s) with comments: INSERTION OF IMPLANTABLE LEFT VENTRICULAR ASSIST DEVICE (N/A) - CIRC ARREST  NITRIC OXIDE TRANSESOPHAGEAL ECHOCARDIOGRAM (TEE) (N/A)  SURGEON:  Surgeon(s) and Role:    * Kerin Perna, MD - Primary    * Alleen Borne, MD - Assisting  PHYSICIAN ASSISTANT: none  ASSISTANTS: TOW, RNFA   ANESTHESIA:   general  EBL:  Total I/O In: 3682.5 [I.V.:1611; Blood:1309; IV Piggyback:762.5] Out: 1000 [Urine:400; Blood:600]  BLOOD ADMINISTERED:2 FFP and 1 PLTS  DRAINS: 2 Chest Tube(s) in the pleural spaces , anterior MT, VAD pocket drain  LOCAL MEDICATIONS USED:  NONE  SPECIMEN:  Excision LV apex  DISPOSITION OF SPECIMEN:  PATHOLOGY  COUNTS:  YES  TOURNIQUET:  * No tourniquets in log *  DICTATION: .Dragon Dictation  PLAN OF CARE: Admit to inpatient   PATIENT DISPOSITION:  ICU - intubated and critically ill.   Delay start of Pharmacological VTE agent (>24hrs) due to surgical blood loss or risk of bleeding: yes

## 2015-09-23 NOTE — Progress Notes (Signed)
Reduced NO by 5ppm per Nitric wean order.

## 2015-09-23 NOTE — Progress Notes (Addendum)
Advanced Heart Failure Rounding Note   Subjective:    Admitted from [place] with cardiogenic shock for advanced heart failure.   On 12/22 foley placed by Urology requiring urethral dilation -> UTI . ? Septic shock. On vanco/zosyn   IABP placed 12/23. Now on milrinone. Levophed off. BiV pacing rate turned up to 75.   Feels good. Nurse reported FOBT + last night and also some blood around Foley tip. Formed brown BMs last night. Hemoglobint running 11-12 range.  Creatinine leveled off at 1.6. Co-ox 78%. Ernestine Conrad out.    Objective:   Weight Range:  Vital Signs:   Temp:  [98 F (36.7 C)-98.7 F (37.1 C)] 98.3 F (36.8 C) (12/28 0400) Pulse Rate:  [76-90] 82 (12/28 0700) Resp:  [0-32] 0 (12/28 0700) BP: (126-171)/(37-68) 155/64 mmHg (12/28 0700) SpO2:  [91 %-98 %] 95 % (12/28 0700) Arterial Line BP: (107-191)/(34-43) 191/40 mmHg (12/27 1300) Weight:  [64.9 kg (143 lb 1.3 oz)] 64.9 kg (143 lb 1.3 oz) (12/28 0500) Last BM Date: 09/22/15  Weight change: Filed Weights   09/21/15 0500 09/22/15 0500 09/04/2015 0500  Weight: 63.2 kg (139 lb 5.3 oz) 63.9 kg (140 lb 14 oz) 64.9 kg (143 lb 1.3 oz)    Intake/Output:   Intake/Output Summary (Last 24 hours) at 09/10/2015 0746 Last data filed at 09/10/2015 0600  Gross per 24 hour  Intake 1866.1 ml  Output   1540 ml  Net  326.1 ml     Physical Exam: General: Awake  In bed.  No resp difficulty HEENT: normal Neck: supple.  Carotids 2+ bilat; no bruits. No lymphadenopathy or thryomegaly appreciated. Cor: PMI nondisplaced. Regular rate & rhythm. No rubs, or murmurs. +S3  Lungs: clear Abdomen: soft, nontender, nondistended. No hepatosplenomegaly. No bruits or masses. Good bowel sounds. Extremities: no cyanosis, clubbing, rash, edema RUE PICC  LUE radial arterial line. R groin IABP Neuro: alert & orientedx3, cranial nerves grossly intact. moves all 4 extremities w/o difficulty. Affect pleasant GU: Foley - clear urine . Mild blood around  Foley tip  Telemetry: AV paced 75  Labs: Basic Metabolic Panel:  Recent Labs Lab 09/19/15 1850 09/20/15 0540 09/21/15 0544 09/21/15 1025 09/22/15 0700 09/05/2015 0534  NA 141 143 145  --  147* 144  K 3.8 3.6 3.4*  --  3.9 3.5  CL 114* 116* 118*  --  123* 119*  CO2 19* 20* 21*  --  18* 19*  GLUCOSE 166* 163* 123*  --  113* 140*  BUN 36* 32* 23*  --  19 17  CREATININE 2.31* 2.08* 1.93*  --  1.61* 1.67*  CALCIUM 7.6* 7.6* 7.6*  --  7.8* 7.9*  MG  --   --   --  2.0  --   --     Liver Function Tests:  Recent Labs Lab 09/14/2015 2017 09/21/2015 0534  AST 36 51*  ALT 31 34  ALKPHOS 117 97  BILITOT 1.5* 0.5  PROT 5.7* 4.7*  ALBUMIN 2.5* 1.6*   No results for input(s): LIPASE, AMYLASE in the last 168 hours. No results for input(s): AMMONIA in the last 168 hours.  CBC:  Recent Labs Lab 09/19/15 0500 09/20/15 0540 09/21/15 0544 09/22/15 0500 09/06/2015 0534  WBC 12.3* 10.6* 9.3 8.3 8.5  HGB 13.4 12.6* 11.9* 12.1* 11.1*  HCT 37.3* 35.7* 34.4* 34.7* 31.9*  MCV 86.7 87.3 86.4 86.8 86.2  PLT 411* 327 232 236 254    Cardiac Enzymes: No results for input(s):  CKTOTAL, CKMB, CKMBINDEX, TROPONINI in the last 168 hours.  BNP: BNP (last 3 results)  Recent Labs  09/26/2015 1803  BNP >4500.0*    ProBNP (last 3 results) No results for input(s): PROBNP in the last 8760 hours.    Other results:  Imaging: Ct Abdomen Pelvis Wo Contrast  09/22/2015  CLINICAL DATA:  74 year old male scheduled for left ventricular assist device (LVAD) placement on Thursday. Evaluate anatomy. Elevated PSA. EXAM: CT CHEST, ABDOMEN AND PELVIS WITHOUT CONTRAST TECHNIQUE: Multidetector CT imaging of the chest, abdomen and pelvis was performed following the standard protocol without IV contrast. COMPARISON:  No priors. FINDINGS: LVAD SPECIFIC FINDINGS: The apex of the left ventricle appears deep to the left fifth/sixth interspace. The peritoneal cavity in the left upper quadrant appears unobstructed by  underlying mass or organomegaly. There is a paucity of subcutaneous fat in the upper abdomen, which may make drive line placement challenging. CT CHEST FINDINGS Mediastinum/Lymph Nodes: Heart size is mildly enlarged. There is no significant pericardial fluid, thickening or pericardial calcification. Left-sided biventricular pacemaker/AICD with lead tips terminating in the right atrial appendage, right ventricular apex, and overlying the lateral wall the left ventricle via the coronary sinus and coronary veins. Right internal jugular central venous catheter with tip terminating in the distal superior vena cava. Intra aortic balloon pump in position the tip terminating in the proximal descending thoracic aorta. Right upper extremity PICC with tip terminating in the distal superior vena cava. No pathologically enlarged mediastinal or hilar lymph nodes. Please note that accurate exclusion of hilar adenopathy is limited on noncontrast CT scans. Esophagus is unremarkable in appearance. No axillary lymphadenopathy. Lungs/Pleura: Moderate bilateral pleural effusions lying dependently with extensive passive atelectasis in the dependent portions of the lower lobes of the lungs bilaterally no acute consolidative airspace disease. No suspicious appearing pulmonary nodules or masses. Musculoskeletal/Soft Tissues: Bilateral gynecomastia. Diffuse body wall edema. There are no aggressive appearing lytic or blastic lesions noted in the visualized portions of the skeleton. CT ABDOMEN AND PELVIS FINDINGS Hepatobiliary: Several small sub cm low-attenuation liver lesions are noted, incompletely characterized on today's noncontrast CT examination, but likely to represent small cysts. Gallbladder is normal in appearance. Pancreas: No definite pancreatic mass or peripancreatic inflammatory changes are noted on today's noncontrast CT examination. Spleen: Unremarkable. Adrenals/Urinary Tract: Unenhanced appearance of bilateral adrenal glands  and bilateral kidneys is unremarkable. No hydroureteronephrosis. Urinary bladder is nearly completely decompressed with an indwelling Foley catheter. Small amount of gas in the urinary bladder, presumably iatrogenic. Stomach/Bowel: Unenhanced appearance of the stomach is normal. No pathologic dilatation of small bowel or colon. Vascular/Lymphatic: Intra-aortic balloon pump in position, as previously discussed, placed via right femoral approach. Atherosclerotic calcifications in the abdominal and pelvic vasculature appear to be rather mild. No definite aneurysm. No lymphadenopathy noted in the abdomen or pelvis on today's noncontrast CT examination. Reproductive: Prostate gland is enlarged measuring 5.8 x 6.3 cm. Seminal vesicles are unremarkable in appearance. Other: Mild diffuse mesenteric edema. Trace volume of ascites. No pneumoperitoneum. Musculoskeletal: Diffuse body wall edema. There are no aggressive appearing lytic or blastic lesions noted in the visualized portions of the skeleton. IMPRESSION: 1. No anatomic constraints to placement of left ventricular assist device. However, there is a paucity of subcutaneous fat in this individual, which may impact drive line placement in the upper abdomen. 2. Mild cardiomegaly. 3. Atherosclerosis, including left main and left anterior descending coronary artery disease. Please note that although the presence of coronary artery calcium documents the presence of coronary artery  disease, the severity of this disease and any potential stenosis cannot be assessed on this non-gated CT examination. Assessment for potential risk factor modification, dietary therapy or pharmacologic therapy may be warranted, if clinically indicated. 4. Moderate bilateral pleural effusions, diffuse mesenteric edema, small volume of ascites and diffuse body wall edema; imaging findings suggestive of a state of anasarca. 5. Additional incidental findings, as above. Electronically Signed   By: Trudie Reed M.D.   On: 09/22/2015 14:04   Ct Chest Wo Contrast  09/22/2015  CLINICAL DATA:  74 year old male scheduled for left ventricular assist device (LVAD) placement on Thursday. Evaluate anatomy. Elevated PSA. EXAM: CT CHEST, ABDOMEN AND PELVIS WITHOUT CONTRAST TECHNIQUE: Multidetector CT imaging of the chest, abdomen and pelvis was performed following the standard protocol without IV contrast. COMPARISON:  No priors. FINDINGS: LVAD SPECIFIC FINDINGS: The apex of the left ventricle appears deep to the left fifth/sixth interspace. The peritoneal cavity in the left upper quadrant appears unobstructed by underlying mass or organomegaly. There is a paucity of subcutaneous fat in the upper abdomen, which may make drive line placement challenging. CT CHEST FINDINGS Mediastinum/Lymph Nodes: Heart size is mildly enlarged. There is no significant pericardial fluid, thickening or pericardial calcification. Left-sided biventricular pacemaker/AICD with lead tips terminating in the right atrial appendage, right ventricular apex, and overlying the lateral wall the left ventricle via the coronary sinus and coronary veins. Right internal jugular central venous catheter with tip terminating in the distal superior vena cava. Intra aortic balloon pump in position the tip terminating in the proximal descending thoracic aorta. Right upper extremity PICC with tip terminating in the distal superior vena cava. No pathologically enlarged mediastinal or hilar lymph nodes. Please note that accurate exclusion of hilar adenopathy is limited on noncontrast CT scans. Esophagus is unremarkable in appearance. No axillary lymphadenopathy. Lungs/Pleura: Moderate bilateral pleural effusions lying dependently with extensive passive atelectasis in the dependent portions of the lower lobes of the lungs bilaterally no acute consolidative airspace disease. No suspicious appearing pulmonary nodules or masses. Musculoskeletal/Soft Tissues: Bilateral  gynecomastia. Diffuse body wall edema. There are no aggressive appearing lytic or blastic lesions noted in the visualized portions of the skeleton. CT ABDOMEN AND PELVIS FINDINGS Hepatobiliary: Several small sub cm low-attenuation liver lesions are noted, incompletely characterized on today's noncontrast CT examination, but likely to represent small cysts. Gallbladder is normal in appearance. Pancreas: No definite pancreatic mass or peripancreatic inflammatory changes are noted on today's noncontrast CT examination. Spleen: Unremarkable. Adrenals/Urinary Tract: Unenhanced appearance of bilateral adrenal glands and bilateral kidneys is unremarkable. No hydroureteronephrosis. Urinary bladder is nearly completely decompressed with an indwelling Foley catheter. Small amount of gas in the urinary bladder, presumably iatrogenic. Stomach/Bowel: Unenhanced appearance of the stomach is normal. No pathologic dilatation of small bowel or colon. Vascular/Lymphatic: Intra-aortic balloon pump in position, as previously discussed, placed via right femoral approach. Atherosclerotic calcifications in the abdominal and pelvic vasculature appear to be rather mild. No definite aneurysm. No lymphadenopathy noted in the abdomen or pelvis on today's noncontrast CT examination. Reproductive: Prostate gland is enlarged measuring 5.8 x 6.3 cm. Seminal vesicles are unremarkable in appearance. Other: Mild diffuse mesenteric edema. Trace volume of ascites. No pneumoperitoneum. Musculoskeletal: Diffuse body wall edema. There are no aggressive appearing lytic or blastic lesions noted in the visualized portions of the skeleton. IMPRESSION: 1. No anatomic constraints to placement of left ventricular assist device. However, there is a paucity of subcutaneous fat in this individual, which may impact drive line placement in the  upper abdomen. 2. Mild cardiomegaly. 3. Atherosclerosis, including left main and left anterior descending coronary artery  disease. Please note that although the presence of coronary artery calcium documents the presence of coronary artery disease, the severity of this disease and any potential stenosis cannot be assessed on this non-gated CT examination. Assessment for potential risk factor modification, dietary therapy or pharmacologic therapy may be warranted, if clinically indicated. 4. Moderate bilateral pleural effusions, diffuse mesenteric edema, small volume of ascites and diffuse body wall edema; imaging findings suggestive of a state of anasarca. 5. Additional incidental findings, as above. Electronically Signed   By: Trudie Reed M.D.   On: 09/22/2015 14:04     Medications:     Scheduled Medications: . aminocaproic acid (AMICAR) for OHS   Intravenous To OR  . amiodarone  200 mg Oral Daily  . atorvastatin  40 mg Oral q1800  . cefUROXime (ZINACEF)  IV  1.5 g Intravenous To OR  . cefUROXime (ZINACEF)  IV  750 mg Intravenous To OR  . dexmedetomidine  0.1-0.7 mcg/kg/hr Intravenous To OR  . DOBUTamine  2-10 mcg/kg/min Intravenous To OR  . DOPamine  0-10 mcg/kg/min Intravenous To OR  . epinephrine  0-10 mcg/min Intravenous To OR  . fluconazole (DIFLUCAN) IV  400 mg Intravenous To OR  . heparin 30,000 units/NS 1000 mL solution for CELLSAVER   Other To OR  . hydrALAZINE  50 mg Oral 3 times per day  . insulin aspart  0-5 Units Subcutaneous QHS  . insulin aspart  0-9 Units Subcutaneous TID WC  . insulin (NOVOLIN-R) infusion   Intravenous To OR  . magnesium sulfate  40 mEq Other To OR  . milrinone  0.3 mcg/kg/min Intravenous To OR  . mupirocin ointment  1 application Nasal BID  . nitroGLYCERIN  0-100 mcg/min Intravenous To OR  . norepinephrine (LEVOPHED) Adult infusion  0-12 mcg/min Intravenous To OR  . phenylephrine (NEO-SYNEPHRINE) Adult infusion  0-100 mcg/min Intravenous To OR  . piperacillin-tazobactam (ZOSYN)  IV  3.375 g Intravenous 3 times per day  . potassium chloride  80 mEq Other To OR  .  rifampin (RIFADIN) IVPB  600 mg Intravenous Q24H  . rifampin (RIFADIN) IVPB  600 mg Intravenous To OR  . sildenafil  20 mg Oral TID  . sodium chloride  10-40 mL Intracatheter Q12H  . sodium chloride  3 mL Intravenous Q12H  . sodium chloride  3 mL Intravenous Q12H  . sodium chloride  3 mL Intravenous Q12H  . vancomycin  1,250 mg Intravenous To OR  . vancomycin  1,000 mg Intravenous Q24H  . vancomycin  1,000 mg Other To OR  . vasopressin (PITRESSIN) infusion - *FOR SHOCK*  0.04 Units/min Intravenous To OR    Infusions: . sodium chloride 20 mL/hr at 09/22/15 1400  . heparin 650 Units/hr (09/22/15 1622)  . milrinone 0.25 mcg/kg/min (08/28/2015 0207)  . norepinephrine (LEVOPHED) Adult infusion Stopped (09/20/15 1100)  . vasopressin (PITRESSIN) infusion - *FOR SHOCK* Stopped (09/19/15 0730)    PRN Medications: sodium chloride, Place/Maintain arterial line **AND** sodium chloride, sodium chloride, sodium chloride, acetaminophen, acetaminophen, diazepam, hydrALAZINE, ondansetron (ZOFRAN) IV, ondansetron (ZOFRAN) IV, sodium chloride, sodium chloride, sodium chloride, sodium chloride, temazepam   Assessment:   1. Cardiogenic/septicshock    --IABP placed 12/23 2. 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 RV mild to moderate HK  --s/p [employer] CRT-D 3. PAF on Eiiquis  4. 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 (previous baseline ~1.5-1.7) 6. HTN 7. Hyperlipidemia 8. Pulmonary HTN 9. Urinary Retention s/p urethral dilation - hematurai resolved.   Plan/Discussion:    Presented as INTERMACS-1. Continues to improve with IABP and inotropes. Creatinine plateaued at 1.6. (this was previous baseline)  Mild FOBT+ but hgb stable and brown stool . Has been on Eliquis for a long time and warfarin before that with no bleeding.   Discussed with Dr. Donata Clay at bedside. To OR  today for VAD placement. We discussed expected recovery period and possible complications.   Continue IABP and milrinone. PVCs quiescent after electrolytes supped.   The patient is critically ill with multiple organ systems failure and requires high complexity decision making for assessment and support, frequent evaluation and titration of therapies, application of advanced monitoring technologies and extensive interpretation of multiple databases.   Critical Care Time devoted to patient care services described in this note is 35 Minutes.  Bensimhon, Daniel,MD 7:46 AM Advanced Heart Failure Team Pager [phone] (M-F; 7a - 4p)  Please contact [facility] for night-coverage after hours (4p -7a ) and weekends on [email or web address]

## 2015-09-24 ENCOUNTER — Encounter (HOSPITAL_COMMUNITY): Payer: Self-pay | Admitting: Cardiothoracic Surgery

## 2015-09-24 ENCOUNTER — Inpatient Hospital Stay (HOSPITAL_COMMUNITY): Payer: Medicare Other

## 2015-09-24 DIAGNOSIS — Z95811 Presence of heart assist device: Secondary | ICD-10-CM

## 2015-09-24 DIAGNOSIS — I5043 Acute on chronic combined systolic (congestive) and diastolic (congestive) heart failure: Secondary | ICD-10-CM

## 2015-09-24 LAB — POCT I-STAT 3, ART BLOOD GAS (G3+)
ACID-BASE DEFICIT: 5 mmol/L — AB (ref 0.0–2.0)
ACID-BASE DEFICIT: 5 mmol/L — AB (ref 0.0–2.0)
BICARBONATE: 20.1 meq/L (ref 20.0–24.0)
BICARBONATE: 20.3 meq/L (ref 20.0–24.0)
O2 Saturation: 99 %
O2 Saturation: 100 %
PO2 ART: 184 mmHg — AB (ref 80.0–100.0)
TCO2: 21 mmol/L (ref 0–100)
TCO2: 21 mmol/L (ref 0–100)
pCO2 arterial: 33.9 mmHg — ABNORMAL LOW (ref 35.0–45.0)
pCO2 arterial: 35.8 mmHg (ref 35.0–45.0)
pH, Arterial: 7.377 (ref 7.350–7.450)
pH, Arterial: 7.357 (ref 7.350–7.450)
pO2, Arterial: 158 mmHg — ABNORMAL HIGH (ref 80.0–100.0)

## 2015-09-24 LAB — POCT I-STAT, CHEM 8
BUN: 16 mg/dL (ref 6–20)
CREATININE: 1.6 mg/dL — AB (ref 0.61–1.24)
Calcium, Ion: 1.19 mmol/L (ref 1.13–1.30)
Chloride: 115 mmol/L — ABNORMAL HIGH (ref 101–111)
Glucose, Bld: 147 mg/dL — ABNORMAL HIGH (ref 65–99)
HEMATOCRIT: 31 % — AB (ref 39.0–52.0)
HEMOGLOBIN: 10.5 g/dL — AB (ref 13.0–17.0)
POTASSIUM: 3.8 mmol/L (ref 3.5–5.1)
SODIUM: 147 mmol/L — AB (ref 135–145)
TCO2: 21 mmol/L (ref 0–100)

## 2015-09-24 LAB — PREPARE FRESH FROZEN PLASMA
Unit division: 0
Unit division: 0
Unit division: 0
Unit division: 0
Unit division: 0
Unit division: 0
Unit division: 0
Unit division: 0

## 2015-09-24 LAB — CBC WITH DIFFERENTIAL/PLATELET
Basophils Absolute: 0 10*3/uL (ref 0.0–0.1)
Basophils Relative: 0 %
Eosinophils Absolute: 0.3 10*3/uL (ref 0.0–0.7)
Eosinophils Relative: 3 %
HCT: 33.7 % — ABNORMAL LOW (ref 39.0–52.0)
Hemoglobin: 11.6 g/dL — ABNORMAL LOW (ref 13.0–17.0)
Lymphocytes Relative: 5 %
Lymphs Abs: 0.6 10*3/uL — ABNORMAL LOW (ref 0.7–4.0)
MCH: 28.9 pg (ref 26.0–34.0)
MCHC: 34.4 g/dL (ref 30.0–36.0)
MCV: 83.8 fL (ref 78.0–100.0)
Monocytes Absolute: 1.4 10*3/uL — ABNORMAL HIGH (ref 0.1–1.0)
Monocytes Relative: 14 %
Neutro Abs: 8 10*3/uL — ABNORMAL HIGH (ref 1.7–7.7)
Neutrophils Relative %: 78 %
Platelets: 146 10*3/uL — ABNORMAL LOW (ref 150–400)
RBC: 4.02 MIL/uL — ABNORMAL LOW (ref 4.22–5.81)
RDW: 14.8 % (ref 11.5–15.5)
WBC: 10.3 10*3/uL (ref 4.0–10.5)

## 2015-09-24 LAB — GLUCOSE, CAPILLARY
GLUCOSE-CAPILLARY: 87 mg/dL (ref 65–99)
GLUCOSE-CAPILLARY: 99 mg/dL (ref 65–99)
GLUCOSE-CAPILLARY: 102 mg/dL — AB (ref 65–99)
GLUCOSE-CAPILLARY: 124 mg/dL — AB (ref 65–99)
GLUCOSE-CAPILLARY: 138 mg/dL — AB (ref 65–99)
Glucose-Capillary: 100 mg/dL — ABNORMAL HIGH (ref 65–99)
Glucose-Capillary: 114 mg/dL — ABNORMAL HIGH (ref 65–99)
Glucose-Capillary: 116 mg/dL — ABNORMAL HIGH (ref 65–99)
Glucose-Capillary: 137 mg/dL — ABNORMAL HIGH (ref 65–99)
Glucose-Capillary: 139 mg/dL — ABNORMAL HIGH (ref 65–99)

## 2015-09-24 LAB — PROTIME-INR
INR: 1.21 (ref 0.00–1.49)
Prothrombin Time: 15.5 seconds — ABNORMAL HIGH (ref 11.6–15.2)

## 2015-09-24 LAB — LACTATE DEHYDROGENASE: LDH: 372 U/L — ABNORMAL HIGH (ref 98–192)

## 2015-09-24 LAB — CBC
HCT: 29.5 % — ABNORMAL LOW (ref 39.0–52.0)
Hemoglobin: 10.4 g/dL — ABNORMAL LOW (ref 13.0–17.0)
MCH: 29.1 pg (ref 26.0–34.0)
MCHC: 35.3 g/dL (ref 30.0–36.0)
MCV: 82.4 fL (ref 78.0–100.0)
Platelets: 147 10*3/uL — ABNORMAL LOW (ref 150–400)
RBC: 3.58 MIL/uL — ABNORMAL LOW (ref 4.22–5.81)
RDW: 15.6 % — ABNORMAL HIGH (ref 11.5–15.5)
WBC: 10.7 10*3/uL — ABNORMAL HIGH (ref 4.0–10.5)

## 2015-09-24 LAB — CARBOXYHEMOGLOBIN
Carboxyhemoglobin: 2 % — ABNORMAL HIGH (ref 0.5–1.5)
Carboxyhemoglobin: 1.7 % — ABNORMAL HIGH (ref 0.5–1.5)
Methemoglobin: 1.2 % (ref 0.0–1.5)
Methemoglobin: 1.1 % (ref 0.0–1.5)
O2 Saturation: 82.7 %
O2 Saturation: 80.7 %
Total hemoglobin: 11 g/dL — ABNORMAL LOW (ref 13.5–18.0)
Total hemoglobin: 10 g/dL — ABNORMAL LOW (ref 13.5–18.0)

## 2015-09-24 LAB — COMPREHENSIVE METABOLIC PANEL
ALT: 28 U/L (ref 17–63)
AST: 84 U/L — ABNORMAL HIGH (ref 15–41)
Albumin: 3.2 g/dL — ABNORMAL LOW (ref 3.5–5.0)
Alkaline Phosphatase: 66 U/L (ref 38–126)
Anion gap: 10 (ref 5–15)
BUN: 13 mg/dL (ref 6–20)
CO2: 20 mmol/L — ABNORMAL LOW (ref 22–32)
Calcium: 8.3 mg/dL — ABNORMAL LOW (ref 8.9–10.3)
Chloride: 117 mmol/L — ABNORMAL HIGH (ref 101–111)
Creatinine, Ser: 1.4 mg/dL — ABNORMAL HIGH (ref 0.61–1.24)
GFR calc Af Amer: 56 mL/min — ABNORMAL LOW (ref >60)
GFR calc non Af Amer: 48 mL/min — ABNORMAL LOW (ref >60)
Glucose, Bld: 131 mg/dL — ABNORMAL HIGH (ref 65–99)
Potassium: 4 mmol/L (ref 3.5–5.1)
Sodium: 147 mmol/L — ABNORMAL HIGH (ref 135–145)
Total Bilirubin: 4.3 mg/dL — ABNORMAL HIGH (ref 0.3–1.2)
Total Protein: 5.5 g/dL — ABNORMAL LOW (ref 6.5–8.1)

## 2015-09-24 LAB — PREPARE PLATELET PHERESIS: Unit division: 0

## 2015-09-24 LAB — BRAIN NATRIURETIC PEPTIDE: B Natriuretic Peptide: 1359.8 pg/mL — ABNORMAL HIGH (ref 0.0–100.0)

## 2015-09-24 LAB — LUPUS ANTICOAGULANT PANEL
DRVVT: 62.1 s — AB (ref 0.0–44.0)
PTT Lupus Anticoagulant: 89.7 s — ABNORMAL HIGH (ref 0.0–40.6)

## 2015-09-24 LAB — MAGNESIUM
Magnesium: 2.7 mg/dL — ABNORMAL HIGH (ref 1.7–2.4)
Magnesium: 2.4 mg/dL (ref 1.7–2.4)
Magnesium: 2.4 mg/dL (ref 1.7–2.4)

## 2015-09-24 LAB — CREATININE, SERUM
Creatinine, Ser: 1.44 mg/dL — ABNORMAL HIGH (ref 0.61–1.24)
Creatinine, Ser: 1.81 mg/dL — ABNORMAL HIGH (ref 0.61–1.24)
GFR calc Af Amer: 54 mL/min — ABNORMAL LOW (ref >60)
GFR calc Af Amer: 41 mL/min — ABNORMAL LOW (ref >60)
GFR calc non Af Amer: 46 mL/min — ABNORMAL LOW (ref >60)
GFR calc non Af Amer: 35 mL/min — ABNORMAL LOW (ref >60)

## 2015-09-24 LAB — DRVVT CONFIRM: DRVVT CONFIRM: 1 ratio (ref 0.8–1.2)

## 2015-09-24 LAB — DRVVT MIX: DRVVT MIX: 44.8 s — AB (ref 0.0–44.0)

## 2015-09-24 LAB — HEXAGONAL PHASE PHOSPHOLIPID: HEXAGONAL PHASE PHOSPHOLIPID: 13 s — AB (ref 0–11)

## 2015-09-24 LAB — POCT ACTIVATED CLOTTING TIME: ACTIVATED CLOTTING TIME: 147 s

## 2015-09-24 LAB — PTT-LA MIX: PTT-LA Mix: 74.5 s — ABNORMAL HIGH (ref 0.0–40.6)

## 2015-09-24 LAB — PHOSPHORUS: Phosphorus: 3 mg/dL (ref 2.5–4.6)

## 2015-09-24 LAB — CALCIUM, IONIZED: Calcium, Ionized, Serum: 4.4 mg/dL — ABNORMAL LOW (ref 4.5–5.6)

## 2015-09-24 MED ORDER — MORPHINE SULFATE (PF) 2 MG/ML IV SOLN: 1.0000 mg | INTRAVENOUS | Status: AC | PRN

## 2015-09-24 MED ORDER — WARFARIN - PHYSICIAN DOSING INPATIENT
Freq: Every day | Status: DC
  Administered 2015-09-24 – 2015-09-25 (×2)
  Administered 2015-09-27: 1
  Administered 2015-09-30 – 2015-10-09 (×5)
  Administered 2015-10-10 – 2015-10-11 (×2): 1
  Filled 2015-09-24: qty 1

## 2015-09-24 MED ORDER — FUROSEMIDE 10 MG/ML IJ SOLN
40.0000 mg | Freq: Once | INTRAMUSCULAR | Status: AC
  Administered 2015-09-24: 40 mg via INTRAVENOUS

## 2015-09-24 MED ORDER — WARFARIN SODIUM 2 MG PO TABS
2.0000 mg | ORAL_TABLET | Freq: Every day | ORAL | Status: DC
  Administered 2015-09-24: 2 mg via ORAL
  Filled 2015-09-24 (×3): qty 1

## 2015-09-24 MED ORDER — INSULIN ASPART 100 UNIT/ML ~~LOC~~ SOLN
0.0000 [IU] | SUBCUTANEOUS | Status: DC
  Administered 2015-09-24 – 2015-09-26 (×7): 2 [IU] via SUBCUTANEOUS

## 2015-09-24 MED ORDER — HYDRALAZINE HCL 20 MG/ML IJ SOLN
10.0000 mg | INTRAMUSCULAR | Status: DC | PRN
  Administered 2015-09-24 – 2015-10-05 (×10): 10 mg via INTRAVENOUS
  Filled 2015-09-24 (×12): qty 1

## 2015-09-24 MED ORDER — WARFARIN SODIUM 2.5 MG PO TABS: 2.5000 mg | ORAL_TABLET | Freq: Every day | ORAL | Status: DC

## 2015-09-24 MED ORDER — VANCOMYCIN HCL IN DEXTROSE 1-5 GM/200ML-% IV SOLN
1000.0000 mg | INTRAVENOUS | Status: DC
  Filled 2015-09-24: qty 200

## 2015-09-24 MED ORDER — SODIUM CHLORIDE 0.9 % IJ SOLN
10.0000 mL | Freq: Two times a day (BID) | INTRAMUSCULAR | Status: DC
  Administered 2015-09-24 – 2015-10-04 (×17): 10 mL via INTRAVENOUS
  Administered 2015-10-05: 20 mL via INTRAVENOUS
  Administered 2015-10-05 – 2015-10-07 (×3): 10 mL via INTRAVENOUS

## 2015-09-24 MED ORDER — VANCOMYCIN HCL IN DEXTROSE 1-5 GM/200ML-% IV SOLN
1000.0000 mg | INTRAVENOUS | Status: DC
  Administered 2015-09-24: 1000 mg via INTRAVENOUS
  Filled 2015-09-24 (×2): qty 200

## 2015-09-24 MED ORDER — CALCIUM CHLORIDE 10 % IV SOLN
1.0000 g | Freq: Once | INTRAVENOUS | Status: AC
  Administered 2015-09-24: 1 g via INTRAVENOUS

## 2015-09-24 MED ORDER — FUROSEMIDE 10 MG/ML IJ SOLN
40.0000 mg | Freq: Three times a day (TID) | INTRAMUSCULAR | Status: DC
  Administered 2015-09-24 (×2): 40 mg via INTRAVENOUS
  Filled 2015-09-24: qty 4

## 2015-09-24 MED ORDER — ALBUMIN HUMAN 25 % IV SOLN
12.5000 g | Freq: Four times a day (QID) | INTRAVENOUS | Status: AC
  Administered 2015-09-24 – 2015-09-25 (×4): 12.5 g via INTRAVENOUS
  Filled 2015-09-24 (×4): qty 50

## 2015-09-24 MED ORDER — SILDENAFIL CITRATE 20 MG PO TABS
20.0000 mg | ORAL_TABLET | Freq: Three times a day (TID) | ORAL | Status: DC
  Administered 2015-09-24 – 2015-09-25 (×3): 20 mg via ORAL
  Filled 2015-09-24 (×6): qty 1

## 2015-09-24 MED ORDER — FUROSEMIDE 10 MG/ML IJ SOLN
20.0000 mg | Freq: Once | INTRAMUSCULAR | Status: DC
  Filled 2015-09-24: qty 2

## 2015-09-24 MED ORDER — AMIODARONE HCL IN DEXTROSE 360-4.14 MG/200ML-% IV SOLN
30.0000 mg/h | INTRAVENOUS | Status: DC
  Administered 2015-09-24 – 2015-09-26 (×4): 30 mg/h via INTRAVENOUS
  Filled 2015-09-24 (×4): qty 200

## 2015-09-24 MED FILL — Sodium Bicarbonate IV Soln 8.4%: INTRAVENOUS | Qty: 100 | Status: AC

## 2015-09-24 MED FILL — Sodium Chloride IV Soln 0.9%: INTRAVENOUS | Qty: 2000 | Status: AC

## 2015-09-24 MED FILL — Electrolyte-R (PH 7.4) Solution: INTRAVENOUS | Qty: 3000 | Status: AC

## 2015-09-24 MED FILL — Lidocaine HCl IV Inj 20 MG/ML: INTRAVENOUS | Qty: 5 | Status: AC

## 2015-09-24 MED FILL — Mannitol IV Soln 20%: INTRAVENOUS | Qty: 500 | Status: AC

## 2015-09-24 MED FILL — Heparin Sodium (Porcine) Inj 1000 Unit/ML: INTRAMUSCULAR | Qty: 10 | Status: AC

## 2015-09-24 NOTE — Progress Notes (Signed)
CT Surgery PM Rounds  Patient was not extubated but had good day  VAD flow and filling are satisfactory Creat slightly up - 1.8 Leave on low dose epi for RV and Norepi as needed to keep MAP 75-90 Hope to extubate in am

## 2015-09-24 NOTE — Anesthesia Postprocedure Evaluation (Signed)
Anesthesia Post Note  Patient: Brad [name]  Procedure(s) Performed: Procedure(s) (LRB): INSERTION OF IMPLANTABLE LEFT VENTRICULAR ASSIST DEVICE (N/A) TRANSESOPHAGEAL ECHOCARDIOGRAM (TEE) (N/A)  Patient location during evaluation: ICU Anesthesia Type: General Level of consciousness: sedated Pain management: pain level controlled Vital Signs Assessment: post-procedure vital signs reviewed and stable Respiratory status: patient remains intubated per anesthesia plan Cardiovascular status: stable Postop Assessment: no signs of nausea or vomiting Anesthetic complications: no    Last Vitals:  Filed Vitals:   09/24/15 1100 09/24/15 1200  BP:    Pulse: 76 76  Temp:  36.3 C  Resp: 16 15    Last Pain:  Filed Vitals:   09/24/15 1232  PainSc: 0-No pain                 Ravenna Legore

## 2015-09-24 NOTE — Progress Notes (Addendum)
Advanced Heart Failure Rounding Note HeartMate 2 Rounding Note  Subjective:    Admitted from [facility] with cardiogenic shock for advanced heart failure.  Milrinone started 12/21. On 12/22 foley placed by Urology requiring urethral dilation -> UTI . ? Septic shock. On vanco/zosyn. IABP placed 12/23.   S/p HM 2 LVAD placement 08/28/2015. He remains on epi 3, milrinone 0.3, nitric 15, with IV lasix and prn hydralazine. Balloon pump removed 09/18/2015 without hematoma.  Remains intubated this am, with plans to wean nitric oxide for extubation.  Unable to wean overnight with VAD flows in 3.2-3.5 range. He nods to questioning and denies CP, soreness, or SOB.   Creatine stable 1.40. INR 1.21, LDH 372. On 325 ASA, No Coumadin yet.   Of note, overall 11.4 L positive this admission. Weight up at least 21 lbs, weight this morning shows up 39 lbs from baseline, but may be related to transition to different floor/bed weights.  LVAD INTERROGATION:  HeartMate II LVAD:  Flow 3.6 liters/min, speed 8800, power 4.6, PI 4.4.  4 PI events, though related to turning on device.  Objective:    Vital Signs:   Temp:  [94.5 F (34.7 C)-98.5 F (36.9 C)] 97 F (36.1 C) (12/29 0400) Pulse Rate:  [48-99] 76 (12/29 0700) Resp:  [11-23] 14 (12/29 0700) BP: (92-156)/(59-90) 119/90 mmHg (12/29 0334) SpO2:  [94 %-100 %] 99 % (12/29 0700) Arterial Line BP: (74-146)/(37-88) 80/70 mmHg (12/29 0700) FiO2 (%):  [40 %-50 %] 40 % (12/29 0400) Weight:  [161 lb 2.5 oz (73.1 kg)] 161 lb 2.5 oz (73.1 kg) (12/29 0400) Last BM Date: 09/22/15 Mean arterial Pressure 70s (70-77)  Intake/Output:   Intake/Output Summary (Last 24 hours) at 09/24/15 0713 Last data filed at 09/24/15 0700  Gross per 24 hour  Intake 7974.06 ml  Output   3820 ml  Net 4154.06 ml     Physical Exam: General: Intubated, nods to questioning. HEENT: normal Neck: supple.  Carotids 2+ bilat; no bruits. No thryomegaly or nodule noted. Cor: PMI  nondisplaced. Regular rate & rhythm. LVAD hum present. Lungs: CTAB anteriorly, mechanical breath sounds. Abdomen: soft, NT, ND, no HSM. No bruits or masses. +BS  Extremities: no cyanosis, clubbing, rash. RUE PICC  LUE radial arterial line. 2+ edema into thighs. SCDs present Neuro: alert & orientedx3, cranial nerves grossly intact. moves all 4 extremities w/o difficulty. Affect pleasant GU: Foley - pinkish-red urine.   Telemetry: AV paced 70s   Labs: Basic Metabolic Panel:  Recent Labs Lab 09/21/15 0544 09/21/15 1025 09/22/15 0700 09/09/2015 0534  09/22/2015 1156 09/06/2015 1326 09/17/2015 1415 09/24/2015 1522 09/12/2015 1600 09/21/2015 2100 09/07/2015 2107 09/24/15 0450  NA 145  --  147* 144  < > 147* 150* 150* 150* 148*  --  149* 147*  K 3.4*  --  3.9 3.5  < > 3.6 3.1* 2.9* 2.8* 2.9*  --  3.3* 4.0  CL 118*  --  123* 119*  < > 114* 113*  --   --  118*  --  116* 117*  CO2 21*  --  18* 19*  --   --   --   --   --  22  --   --  20*  GLUCOSE 123*  --  113* 140*  < > 104* 162*  --  126* 114*  --  121* 131*  BUN 23*  --  19 17  < > 13 15  --   --  13  --  13 13  CREATININE 1.93*  --  1.61* 1.67*  < > 1.00 1.10  --   --  1.42* 1.44* 1.20 1.40*  CALCIUM 7.6*  --  7.8* 7.9*  --   --   --   --   --  7.5*  --   --  8.3*  MG  --  2.0  --   --   --   --   --   --   --  1.8 2.7*  --  2.4  PHOS  --   --   --   --   --   --   --   --   --   --   --   --  3.0  < > = values in this interval not displayed.  Liver Function Tests:  Recent Labs Lab 10/06/15 0534 09/24/15 0450  AST 51* 84*  ALT 34 28  ALKPHOS 97 66  BILITOT 0.5 4.3*  PROT 4.7* 5.5*  ALBUMIN 1.6* 3.2*   No results for input(s): LIPASE, AMYLASE in the last 168 hours. No results for input(s): AMMONIA in the last 168 hours.  CBC:  Recent Labs Lab 09/22/15 0500 10-06-2015 0534  10-06-15 1230  06-Oct-2015 1522 2015-10-06 1530 Oct 06, 2015 1600 October 06, 2015 2100 06-Oct-2015 2107 09/24/15 0450  WBC 8.3 8.5  --   --   --   --   --  8.9 8.5  --   10.3  NEUTROABS  --   --   --   --   --   --   --   --   --   --  8.0*  HGB 12.1* 11.1*  < > 8.4*  < > 9.2*  --  8.5* 7.3* 7.1* 11.6*  HCT 34.7* 31.9*  < > 24.3*  < > 27.0*  --  23.4* 21.2* 21.0* 33.7*  MCV 86.8 86.2  --   --   --   --   --  82.7 84.1  --  83.8  PLT 236 254  --  117*  --   --  150 161 148*  --  146*  < > = values in this interval not displayed.  INR:  Recent Labs Lab 09/22/15 1130 2015/10/06 1530 10/06/2015 1600 09/24/15 0450  INR 1.33 1.45 1.42 1.21    Other results:  EKG:   Imaging: Ct Abdomen Pelvis Wo Contrast  09/22/2015  CLINICAL DATA:  74 year old male scheduled for left ventricular assist device (LVAD) placement on Thursday. Evaluate anatomy. Elevated PSA. EXAM: CT CHEST, ABDOMEN AND PELVIS WITHOUT CONTRAST TECHNIQUE: Multidetector CT imaging of the chest, abdomen and pelvis was performed following the standard protocol without IV contrast. COMPARISON:  No priors. FINDINGS: LVAD SPECIFIC FINDINGS: The apex of the left ventricle appears deep to the left fifth/sixth interspace. The peritoneal cavity in the left upper quadrant appears unobstructed by underlying mass or organomegaly. There is a paucity of subcutaneous fat in the upper abdomen, which may make drive line placement challenging. CT CHEST FINDINGS Mediastinum/Lymph Nodes: Heart size is mildly enlarged. There is no significant pericardial fluid, thickening or pericardial calcification. Left-sided biventricular pacemaker/AICD with lead tips terminating in the right atrial appendage, right ventricular apex, and overlying the lateral wall the left ventricle via the coronary sinus and coronary veins. Right internal jugular central venous catheter with tip terminating in the distal superior vena cava. Intra aortic balloon pump in position the tip terminating in the proximal descending thoracic aorta. Right upper extremity PICC with tip  terminating in the distal superior vena cava. No pathologically enlarged  mediastinal or hilar lymph nodes. Please note that accurate exclusion of hilar adenopathy is limited on noncontrast CT scans. Esophagus is unremarkable in appearance. No axillary lymphadenopathy. Lungs/Pleura: Moderate bilateral pleural effusions lying dependently with extensive passive atelectasis in the dependent portions of the lower lobes of the lungs bilaterally no acute consolidative airspace disease. No suspicious appearing pulmonary nodules or masses. Musculoskeletal/Soft Tissues: Bilateral gynecomastia. Diffuse body wall edema. There are no aggressive appearing lytic or blastic lesions noted in the visualized portions of the skeleton. CT ABDOMEN AND PELVIS FINDINGS Hepatobiliary: Several small sub cm low-attenuation liver lesions are noted, incompletely characterized on today's noncontrast CT examination, but likely to represent small cysts. Gallbladder is normal in appearance. Pancreas: No definite pancreatic mass or peripancreatic inflammatory changes are noted on today's noncontrast CT examination. Spleen: Unremarkable. Adrenals/Urinary Tract: Unenhanced appearance of bilateral adrenal glands and bilateral kidneys is unremarkable. No hydroureteronephrosis. Urinary bladder is nearly completely decompressed with an indwelling Foley catheter. Small amount of gas in the urinary bladder, presumably iatrogenic. Stomach/Bowel: Unenhanced appearance of the stomach is normal. No pathologic dilatation of small bowel or colon. Vascular/Lymphatic: Intra-aortic balloon pump in position, as previously discussed, placed via right femoral approach. Atherosclerotic calcifications in the abdominal and pelvic vasculature appear to be rather mild. No definite aneurysm. No lymphadenopathy noted in the abdomen or pelvis on today's noncontrast CT examination. Reproductive: Prostate gland is enlarged measuring 5.8 x 6.3 cm. Seminal vesicles are unremarkable in appearance. Other: Mild diffuse mesenteric edema. Trace volume of  ascites. No pneumoperitoneum. Musculoskeletal: Diffuse body wall edema. There are no aggressive appearing lytic or blastic lesions noted in the visualized portions of the skeleton. IMPRESSION: 1. No anatomic constraints to placement of left ventricular assist device. However, there is a paucity of subcutaneous fat in this individual, which may impact drive line placement in the upper abdomen. 2. Mild cardiomegaly. 3. Atherosclerosis, including left main and left anterior descending coronary artery disease. Please note that although the presence of coronary artery calcium documents the presence of coronary artery disease, the severity of this disease and any potential stenosis cannot be assessed on this non-gated CT examination. Assessment for potential risk factor modification, dietary therapy or pharmacologic therapy may be warranted, if clinically indicated. 4. Moderate bilateral pleural effusions, diffuse mesenteric edema, small volume of ascites and diffuse body wall edema; imaging findings suggestive of a state of anasarca. 5. Additional incidental findings, as above. Electronically Signed   By: Trudie Reed M.D.   On: 09/22/2015 14:04   Ct Chest Wo Contrast  09/22/2015  CLINICAL DATA:  74 year old male scheduled for left ventricular assist device (LVAD) placement on Thursday. Evaluate anatomy. Elevated PSA. EXAM: CT CHEST, ABDOMEN AND PELVIS WITHOUT CONTRAST TECHNIQUE: Multidetector CT imaging of the chest, abdomen and pelvis was performed following the standard protocol without IV contrast. COMPARISON:  No priors. FINDINGS: LVAD SPECIFIC FINDINGS: The apex of the left ventricle appears deep to the left fifth/sixth interspace. The peritoneal cavity in the left upper quadrant appears unobstructed by underlying mass or organomegaly. There is a paucity of subcutaneous fat in the upper abdomen, which may make drive line placement challenging. CT CHEST FINDINGS Mediastinum/Lymph Nodes: Heart size is mildly  enlarged. There is no significant pericardial fluid, thickening or pericardial calcification. Left-sided biventricular pacemaker/AICD with lead tips terminating in the right atrial appendage, right ventricular apex, and overlying the lateral wall the left ventricle via the coronary sinus and coronary veins. Right internal  jugular central venous catheter with tip terminating in the distal superior vena cava. Intra aortic balloon pump in position the tip terminating in the proximal descending thoracic aorta. Right upper extremity PICC with tip terminating in the distal superior vena cava. No pathologically enlarged mediastinal or hilar lymph nodes. Please note that accurate exclusion of hilar adenopathy is limited on noncontrast CT scans. Esophagus is unremarkable in appearance. No axillary lymphadenopathy. Lungs/Pleura: Moderate bilateral pleural effusions lying dependently with extensive passive atelectasis in the dependent portions of the lower lobes of the lungs bilaterally no acute consolidative airspace disease. No suspicious appearing pulmonary nodules or masses. Musculoskeletal/Soft Tissues: Bilateral gynecomastia. Diffuse body wall edema. There are no aggressive appearing lytic or blastic lesions noted in the visualized portions of the skeleton. CT ABDOMEN AND PELVIS FINDINGS Hepatobiliary: Several small sub cm low-attenuation liver lesions are noted, incompletely characterized on today's noncontrast CT examination, but likely to represent small cysts. Gallbladder is normal in appearance. Pancreas: No definite pancreatic mass or peripancreatic inflammatory changes are noted on today's noncontrast CT examination. Spleen: Unremarkable. Adrenals/Urinary Tract: Unenhanced appearance of bilateral adrenal glands and bilateral kidneys is unremarkable. No hydroureteronephrosis. Urinary bladder is nearly completely decompressed with an indwelling Foley catheter. Small amount of gas in the urinary bladder, presumably  iatrogenic. Stomach/Bowel: Unenhanced appearance of the stomach is normal. No pathologic dilatation of small bowel or colon. Vascular/Lymphatic: Intra-aortic balloon pump in position, as previously discussed, placed via right femoral approach. Atherosclerotic calcifications in the abdominal and pelvic vasculature appear to be rather mild. No definite aneurysm. No lymphadenopathy noted in the abdomen or pelvis on today's noncontrast CT examination. Reproductive: Prostate gland is enlarged measuring 5.8 x 6.3 cm. Seminal vesicles are unremarkable in appearance. Other: Mild diffuse mesenteric edema. Trace volume of ascites. No pneumoperitoneum. Musculoskeletal: Diffuse body wall edema. There are no aggressive appearing lytic or blastic lesions noted in the visualized portions of the skeleton. IMPRESSION: 1. No anatomic constraints to placement of left ventricular assist device. However, there is a paucity of subcutaneous fat in this individual, which may impact drive line placement in the upper abdomen. 2. Mild cardiomegaly. 3. Atherosclerosis, including left main and left anterior descending coronary artery disease. Please note that although the presence of coronary artery calcium documents the presence of coronary artery disease, the severity of this disease and any potential stenosis cannot be assessed on this non-gated CT examination. Assessment for potential risk factor modification, dietary therapy or pharmacologic therapy may be warranted, if clinically indicated. 4. Moderate bilateral pleural effusions, diffuse mesenteric edema, small volume of ascites and diffuse body wall edema; imaging findings suggestive of a state of anasarca. 5. Additional incidental findings, as above. Electronically Signed   By: Trudie Reed M.D.   On: 09/22/2015 14:04   Dg Chest Port 1 View  09/19/2015  CLINICAL DATA:  LVAD EXAM: PORTABLE CHEST 1 VIEW COMPARISON:  09/20/2015.  CT 09/22/2015 FINDINGS: Endotracheal tube is in  place with the tip 3.8 cm above the carina. LVAD is partially imaged. There is mild cardiomegaly. Left AICD is unchanged. Intra aortic balloon pop slightly retracted within the proximal descending thoracic aorta. Bilateral chest tube in place. No pneumothorax. Bibasilar atelectasis. Mild vascular congestion. IMPRESSION: Bilateral chest tubes. No pneumothorax. Intra aortic balloon pump slightly retracted but remains in the proximal descending thoracic aorta. Bibasilar atelectasis. LVAD partially imaged. Electronically Signed   By: Charlett Nose M.D.   On: 09/26/2015 15:53      Medications:     Scheduled Medications: . acetaminophen  1,000 mg Oral 4 times per day   Or  . acetaminophen (TYLENOL) oral liquid 160 mg/5 mL  1,000 mg Per Tube 4 times per day  . antiseptic oral rinse  7 mL Mouth Rinse QID  . aspirin EC  325 mg Oral Daily   Or  . aspirin  324 mg Per Tube Daily   Or  . aspirin  300 mg Rectal Daily  . bisacodyl  10 mg Oral Daily   Or  . bisacodyl  10 mg Rectal Daily  . chlorhexidine gluconate  15 mL Mouth Rinse BID  . docusate sodium  200 mg Oral Daily  . famotidine (PEPCID) IV  20 mg Intravenous Q12H  . insulin aspart  0-24 Units Subcutaneous 6 times per day  . metoCLOPramide (REGLAN) injection  10 mg Intravenous 4 times per day  . [START ON 09/25/2015] pantoprazole  40 mg Oral Daily  . piperacillin-tazobactam (ZOSYN)  IV  3.375 g Intravenous 3 times per day  . rifampin  600 mg Oral Once  . sodium chloride  3 mL Intravenous Q12H  . vancomycin  1,000 mg Intravenous Q12H     Infusions: . sodium chloride 20 mL/hr at 09/24/15 0700  . sodium chloride    . sodium chloride 20 mL/hr at 09/03/2015 1900  . dexmedetomidine 0.7 mcg/kg/hr (09/24/15 0700)  . EPINEPHrine 4 mg in dextrose 5% 250 mL infusion (16 mcg/mL) 3 mcg/min (09/24/15 0700)  . lactated ringers 20 mL/hr at 09/24/15 0700  . lactated ringers Stopped (09/24/15 0700)  . milrinone 0.3 mcg/kg/min (09/24/15 0700)  .  nitroGLYCERIN Stopped (09/25/2015 1500)  . norepinephrine (LEVOPHED) Adult infusion Stopped (09/01/2015 1500)  . phenylephrine (NEO-SYNEPHRINE) Adult infusion Stopped (09/24/2015 1500)  . vasopressin (PITRESSIN) infusion - *FOR SHOCK* Stopped (09/16/2015 1500)     PRN Medications:  sodium chloride, albumin human, hydrALAZINE, lactated ringers, midazolam, morphine injection, ondansetron (ZOFRAN) IV, oxyCODONE, sodium chloride, traMADol   Assessment/Plan   1. Cardiogenic/septicshock -IABP placed 12/23 - 09/03/2015 - Now s/p HM II LVAD placement 09/05/2015 - Remain on milrinone 0.3, Epi 3, and Nitric 15 with plans to wean for extubation today.  - Presented as INTERMACS-1 and placed on milrinone and IABP implanted.  2. 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 RV mild to moderate HK - s/p [employer] CRT-D - As above, s/p LVAD placement.  - He is volume overloaded with fluids for surgery. Will diuresis as able as pt becomes more stable. Got 40 mg IV lasix this morning. Will discuss best course with MD.  3. PAF on Eiiquis  - Anticoags on hold s/p procedure.  4. 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 Electrolytes supped and stable.  5. 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 (previous baseline ~1.5-1.7) - Down to 1.4 now. Will follow closely with diuresis 6. HTN - ART line showing BPs in 80/70s to 90/70s, waveform dampened.  7. Hyperlipidemia 8. Pulmonary HTN - May need to restart his Revatio 9. Urinary Retention s/p urethral dilation  - Noted to have been resolved yesterday, but pinkish red again this morning.  Presented as INTERMACS-1.   I reviewed the LVAD parameters from today, and compared the results to the patient's prior recorded data.  No programming changes were made.  The LVAD is functioning within specified parameters. LVAD interrogation was negative for any significant power changes, alarms or  PI events/speed drops.  LVAD  equipment check completed and is in good working order.  Back-up equipment present.   Length of Stay: [address]  Luane School 09/24/2015, 7:13 AM  VAD Team --- VAD ISSUES ONLY--- Pager [phone] (7am - 7am)  Advanced Heart Failure Team  Pager [phone] (M-F; 7a - 4p)  Please contact [facility] for night-coverage after hours (4p -7a ) and weekends on [email or web address]  Patient seen with PA, agree with the above note.  On norepinephrine gtt now at low dose in addition to epinephrine and milrinone.  He remains on NO at 10.  PIs higher in the 6+ range this afternoon on norepinephrine.  He remains A-V sequentially paced.  CVP 16, PA 44/19, CI 2.1.   - Will add Revatio 20 mg tid to help wean NO.  - Marked volume overload and weight gain.  With stable blood pressure and PI, think that we can add Lasix 40 mg IV every 8 hrs.  Creatinine stable.  - Start coumadin today.  Continue ASA 325 until INR > 2, then decrease to 81.   He remains out of atrial fibrillation on amiodarone.   Marca Ancona 09/24/2015 3:41 PM

## 2015-09-24 NOTE — Progress Notes (Signed)
Dr. Donata Clay made aware of low UOP (10-20/hr), MAP 66, PI 2.3, CVP 16. Orders received to start Levophed,  Pacer Rep [facility]) to turn heart rate up to 90, leave the Nitric at 10ppm and give 20 lasix once BP is stable. Will continue to monitor closely.   Domenica Fail, RN

## 2015-09-24 NOTE — Progress Notes (Signed)
Pharmacy Antibiotic Follow-up Note  Brad [name] is a 74 y.o. year-old male admitted on 09/24/2015.  The patient is currently on day 7 of vancomycin and zosyn  For urosepsis initially, now empiric s/p LVAD 12/28.  Assessment/Plan: 1. Change remaining vancomycin doses to 1g IV q 24 hrs - scheduled to end tonight - will f/u with Dr. Donata Clay about LOT. 2. Zosyn 3.375g IV q 8 hrs (extended interval infusion), scheduled indefinitely for now. 3. Watch renal function, will need vancomycin trough soon if it continues.  Temp (24hrs), Avg:96.1 F (35.6 C), Min:94.5 F (34.7 C), Max:98.5 F (36.9 C)   Recent Labs Lab 09/22/15 0500 09/01/2015 0534 09/18/2015 1600 09/05/2015 2100 09/24/15 0450  WBC 8.3 8.5 8.9 8.5 10.3    Recent Labs Lab 09/01/2015 1326 09/18/2015 1600 09/01/2015 2100 09/16/2015 2107 09/24/15 0450  CREATININE 1.10 1.42* 1.44* 1.20 1.40*   Estimated Creatinine Clearance: 43.3 mL/min (by C-G formula based on Cr of 1.4).    No Known Allergies  Antimicrobials this admission: 12/22 PO Septra x 1 dose, then decompensated so broaden to zosyn 12/22 Zosyn EI>> 12/23 Vanc >  12/ 29 rifampin x 1 dose  Levels/dose changes this admission: None  Microbiology results: 12/22 Bcx ng final 12/22 Ucx insignif growth final MRSA PCR neg  Thank you for allowing pharmacy to be a part of this patient's care.  Tad Moore, BCPS  Clinical Pharmacist Pager [phone]  09/24/2015 7:46 AM

## 2015-09-24 NOTE — Progress Notes (Signed)
LVAD Coordinator Advanced Heart Failure Rounds:  Implanted 09/22/2015 with HMII as DT by Dr. Donata Clay.   Doing well this morning. Speed at 8800 with PIs low ~ 3-4 unless awake (~6). Very little pulsatility on waveform with current speed--attempted to increase at the bedside with Dr. Donata Clay to 9000 to assist with elevated PA pressures, however PI's to 2.5. Bleeding controlled with Hgb stable this morning. Drowsy on ventilator answering simple commands. Pain adequately controlled.   Vital signs: HR: 70s AV paced ICD SJM CRT-D (defib therapy off, pacing on) Doppler MAP: 80 - 90s Arterial BP: 78 - 98 / 70s MAPs 70 - 80s O2 Sat: 100%  Wt:143 (preop) > 161     LVAD interrogation reveals:  Speed: 8800 Flow: 3.5 Power: 4.4w PI: 3.5 - 6 (depending on wakefulness; avg ~ 3 - 4) Alarms:  None since OR Events:  Fixed speed: 8800 Low speed limit: 8200  Back up controller programmed accordingly and is at the bedside. LVAD is functioning within expected parameters. Baseline powers 4.5 - 5w. No programming changes were made this morning.   Drive Line: C/D/I. Aquacel Ag ordered and dressing will be maintained daily. Once patient extubated will start with teaching sterile technique to his wife with support of the RN staff at the bedside. Please try to change dressings during the day so she may observe technique.    Labs:  LDH trend: 372  INR trend: 1.21  Hgb: 11.6  Co-Ox: 82.7% (on 50% FiO2), SVO2 now reading 65%  Creatinine: 1.6 (preop) > 1.4  Antithrombotic Management: 09/24/15--> 325 mg ASA  09/24/15--> Warfarin started   Infusions: Epi 3 Milrinone 0.3 Insulin titrated as needed  Precedex 0.5 Levo +/- (off currently)  OR Blood Products: 4 u FFP 2 u Platelets Cell saver  ICU Blood Products:  09/04/2015--> 2 FFP, 2 pRBC (Hgb 7).   Ventilator: Vent day 2-- 40% FiO2 on SIMV with good co-ox and ABG.   NO 15 ppm (holding)  Plan/Recommendations:   1. Flows ~ 3.5 with CVP 13 - 15 and sPA pressures 60 - 70. Holding NO wean (currently at 15 ppm) with Epi 2, Milrinone 0.3 to support the RV; will leave intubated as d/w Dr. Donata Clay at the bedside. Required Sildenafil pre-op for elevated PVR and may need to consider resuming post-op.   2. FD ASA started today along with coumadin per Dr. Zenaida Niece Trigt--will decrease to 81 mg once INR goal of at least 2 achieved. + Guiac stool pre-op psb due to bleeding from urethra, hemorrhoid or benign cause. Continue to monitor with serial Hgb and will consider throughout recovery period if further intervention/investigation needed.   3. Total bilirubin up at 4.3 today. LDH better than pre-op study (524 > 372; hemolysis on IABP?) Urine still with some blood on appearance however improved since yesterday.   4. Early mobilization once able to prevent deconditioning with age.   5. If intubated much longer will need to consider nutrition d/t poor prealbumin 9.6. D/W Dr. Donata Clay at the bedside and will add the 25% concentrated albumins scheduled to assist with diuresis since he is peripherally overloaded (18 lbs up).    Rexene Alberts, BSN, RN, CCRN  VAD Coordinator   Office: [phone] 24/7 VAD Pager: [phone]

## 2015-09-24 NOTE — Progress Notes (Signed)
Called Dr. Donata Clay about VAD flows now 3.2-3.5, power 4.1-4.5, PI 5.  Pt's CVP 16-18, MAP 85-88, PAD 10-22.  Orders received to increase epi back to 1, milrinone to 0.3, 40 IV lasix, keep nitiric at 15, and prn hydralazine.  Will continue to monitor.   Roselie Awkward, RN

## 2015-09-24 NOTE — Care Management Important Message (Signed)
Important Message  Patient Details  Name: Reyes Kaster MRN: [id number] Date of Birth: Jul 03, 1941   Medicare Important Message Given:  Yes    Kyla Balzarine 09/24/2015, 12:41 PM

## 2015-09-24 NOTE — Progress Notes (Signed)
HeartMate 2 Rounding Note POD#1 Heartmate 2 implantation   Subjective:   Class 4 CHF with cardiogenic shock, hx nonischemic    Cardiomyopathy Preop IABP Hx BPH, TURP and bladder outlet obstructio required cystoscopy to place foley\ Preop severe protein loss malnutrition, prealbumin < 10 Preop acute on chronic renal failure Preop guaiac + stool Preop chronic Eliquis for a-fib  Patient had stable night Neuro intact and CXR clear req 2 u PRBCs for Hb 7 from coagulopathy Min chest tube drainage now Evidence opf postop RV dysfunction with low PI, low flows which responded to holding nitric oxided wean and increasing epi Will leave on NO, intubated for now   LVAD INTERROGATION:  HeartMate II LVAD:  Flow 3.4 liters/min, speed 8800, power 4.2, PI 3.8  Controller intact  Objective:    Vital Signs:   Temp:  [94.5 F (34.7 C)-97.6 F (36.4 C)] 97.2 F (36.2 C) (12/29 0800) Pulse Rate:  [48-99] 74 (12/29 0900) Resp:  [11-23] 15 (12/29 0900) BP: (92-119)/(74-90) 119/90 mmHg (12/29 0334) SpO2:  [99 %-100 %] 100 % (12/29 0900) Arterial Line BP: (73-146)/(37-88) 73/69 mmHg (12/29 0900) FiO2 (%):  [40 %-50 %] 40 % (12/29 0800) Weight:  [161 lb 2.5 oz (73.1 kg)] 161 lb 2.5 oz (73.1 kg) (12/29 0400) Last BM Date: 09/22/15 Mean arterial Pressure 70-75  Intake/Output:   Intake/Output Summary (Last 24 hours) at 09/24/15 0936 Last data filed at 09/24/15 0900  Gross per 24 hour  Intake 8136.61 ml  Output   4255 ml  Net 3881.61 ml     Physical Exam: General:  Well appearing. No resp difficulty HEENT: normal Neck: supple. no JVP  No carotid pulses; no bruits. No lymphadenopathy or thryomegaly appreciated. Cor: Mechanical heart sounds with LVAD hum present. Lungs: clear Abdomen: soft, nontender, nondistended. No hepatosplenomegaly. No bruits or masses. Good bowel sounds. Extremities: no cyanosis, clubbing, rash, edema Neuro: alert & orientedx3, cranial nerves grossly intact. Sedated  but responsive on vent  Telemetry: paced rhythm  Labs: Basic Metabolic Panel:  Recent Labs Lab 09/21/15 0544 09/21/15 1025 09/22/15 0700 08/31/2015 0534  09/22/2015 1156 08/28/2015 1326 09/09/2015 1415 09/06/2015 1522 09/05/2015 1600 08/28/2015 2100 09/17/2015 2107 09/24/15 0450  NA 145  --  147* 144  < > 147* 150* 150* 150* 148*  --  149* 147*  K 3.4*  --  3.9 3.5  < > 3.6 3.1* 2.9* 2.8* 2.9*  --  3.3* 4.0  CL 118*  --  123* 119*  < > 114* 113*  --   --  118*  --  116* 117*  CO2 21*  --  18* 19*  --   --   --   --   --  22  --   --  20*  GLUCOSE 123*  --  113* 140*  < > 104* 162*  --  126* 114*  --  121* 131*  BUN 23*  --  19 17  < > 13 15  --   --  13  --  13 13  CREATININE 1.93*  --  1.61* 1.67*  < > 1.00 1.10  --   --  1.42* 1.44* 1.20 1.40*  CALCIUM 7.6*  --  7.8* 7.9*  --   --   --   --   --  7.5*  --   --  8.3*  MG  --  2.0  --   --   --   --   --   --   --  1.8 2.7*  --  2.4  PHOS  --   --   --   --   --   --   --   --   --   --   --   --  3.0  < > = values in this interval not displayed.  Liver Function Tests:  Recent Labs Lab 08/30/2015 0534 09/24/15 0450  AST 51* 84*  ALT 34 28  ALKPHOS 97 66  BILITOT 0.5 4.3*  PROT 4.7* 5.5*  ALBUMIN 1.6* 3.2*   No results for input(s): LIPASE, AMYLASE in the last 168 hours. No results for input(s): AMMONIA in the last 168 hours.  CBC:  Recent Labs Lab 09/22/15 0500 09/03/2015 0534  09/16/2015 1230  09/14/2015 1522 08/30/2015 1530 09/15/2015 1600 09/02/2015 2100 09/01/2015 2107 09/24/15 0450  WBC 8.3 8.5  --   --   --   --   --  8.9 8.5  --  10.3  NEUTROABS  --   --   --   --   --   --   --   --   --   --  8.0*  HGB 12.1* 11.1*  < > 8.4*  < > 9.2*  --  8.5* 7.3* 7.1* 11.6*  HCT 34.7* 31.9*  < > 24.3*  < > 27.0*  --  23.4* 21.2* 21.0* 33.7*  MCV 86.8 86.2  --   --   --   --   --  82.7 84.1  --  83.8  PLT 236 254  --  117*  --   --  150 161 148*  --  146*  < > = values in this interval not displayed.  INR:  Recent Labs Lab  09/22/15 1130 09/17/2015 1530 09/15/2015 1600 09/24/15 0450  INR 1.33 1.45 1.42 1.21    Other results:  EKG:   Imaging: Ct Abdomen Pelvis Wo Contrast  09/22/2015  CLINICAL DATA:  74 year old male scheduled for left ventricular assist device (LVAD) placement on Thursday. Evaluate anatomy. Elevated PSA. EXAM: CT CHEST, ABDOMEN AND PELVIS WITHOUT CONTRAST TECHNIQUE: Multidetector CT imaging of the chest, abdomen and pelvis was performed following the standard protocol without IV contrast. COMPARISON:  No priors. FINDINGS: LVAD SPECIFIC FINDINGS: The apex of the left ventricle appears deep to the left fifth/sixth interspace. The peritoneal cavity in the left upper quadrant appears unobstructed by underlying mass or organomegaly. There is a paucity of subcutaneous fat in the upper abdomen, which may make drive line placement challenging. CT CHEST FINDINGS Mediastinum/Lymph Nodes: Heart size is mildly enlarged. There is no significant pericardial fluid, thickening or pericardial calcification. Left-sided biventricular pacemaker/AICD with lead tips terminating in the right atrial appendage, right ventricular apex, and overlying the lateral wall the left ventricle via the coronary sinus and coronary veins. Right internal jugular central venous catheter with tip terminating in the distal superior vena cava. Intra aortic balloon pump in position the tip terminating in the proximal descending thoracic aorta. Right upper extremity PICC with tip terminating in the distal superior vena cava. No pathologically enlarged mediastinal or hilar lymph nodes. Please note that accurate exclusion of hilar adenopathy is limited on noncontrast CT scans. Esophagus is unremarkable in appearance. No axillary lymphadenopathy. Lungs/Pleura: Moderate bilateral pleural effusions lying dependently with extensive passive atelectasis in the dependent portions of the lower lobes of the lungs bilaterally no acute consolidative airspace  disease. No suspicious appearing pulmonary nodules or masses. Musculoskeletal/Soft Tissues: Bilateral gynecomastia. Diffuse body wall edema. There are  no aggressive appearing lytic or blastic lesions noted in the visualized portions of the skeleton. CT ABDOMEN AND PELVIS FINDINGS Hepatobiliary: Several small sub cm low-attenuation liver lesions are noted, incompletely characterized on today's noncontrast CT examination, but likely to represent small cysts. Gallbladder is normal in appearance. Pancreas: No definite pancreatic mass or peripancreatic inflammatory changes are noted on today's noncontrast CT examination. Spleen: Unremarkable. Adrenals/Urinary Tract: Unenhanced appearance of bilateral adrenal glands and bilateral kidneys is unremarkable. No hydroureteronephrosis. Urinary bladder is nearly completely decompressed with an indwelling Foley catheter. Small amount of gas in the urinary bladder, presumably iatrogenic. Stomach/Bowel: Unenhanced appearance of the stomach is normal. No pathologic dilatation of small bowel or colon. Vascular/Lymphatic: Intra-aortic balloon pump in position, as previously discussed, placed via right femoral approach. Atherosclerotic calcifications in the abdominal and pelvic vasculature appear to be rather mild. No definite aneurysm. No lymphadenopathy noted in the abdomen or pelvis on today's noncontrast CT examination. Reproductive: Prostate gland is enlarged measuring 5.8 x 6.3 cm. Seminal vesicles are unremarkable in appearance. Other: Mild diffuse mesenteric edema. Trace volume of ascites. No pneumoperitoneum. Musculoskeletal: Diffuse body wall edema. There are no aggressive appearing lytic or blastic lesions noted in the visualized portions of the skeleton. IMPRESSION: 1. No anatomic constraints to placement of left ventricular assist device. However, there is a paucity of subcutaneous fat in this individual, which may impact drive line placement in the upper abdomen. 2. Mild  cardiomegaly. 3. Atherosclerosis, including left main and left anterior descending coronary artery disease. Please note that although the presence of coronary artery calcium documents the presence of coronary artery disease, the severity of this disease and any potential stenosis cannot be assessed on this non-gated CT examination. Assessment for potential risk factor modification, dietary therapy or pharmacologic therapy may be warranted, if clinically indicated. 4. Moderate bilateral pleural effusions, diffuse mesenteric edema, small volume of ascites and diffuse body wall edema; imaging findings suggestive of a state of anasarca. 5. Additional incidental findings, as above. Electronically Signed   By: Trudie Reed M.D.   On: 09/22/2015 14:04   Ct Chest Wo Contrast  09/22/2015  CLINICAL DATA:  74 year old male scheduled for left ventricular assist device (LVAD) placement on Thursday. Evaluate anatomy. Elevated PSA. EXAM: CT CHEST, ABDOMEN AND PELVIS WITHOUT CONTRAST TECHNIQUE: Multidetector CT imaging of the chest, abdomen and pelvis was performed following the standard protocol without IV contrast. COMPARISON:  No priors. FINDINGS: LVAD SPECIFIC FINDINGS: The apex of the left ventricle appears deep to the left fifth/sixth interspace. The peritoneal cavity in the left upper quadrant appears unobstructed by underlying mass or organomegaly. There is a paucity of subcutaneous fat in the upper abdomen, which may make drive line placement challenging. CT CHEST FINDINGS Mediastinum/Lymph Nodes: Heart size is mildly enlarged. There is no significant pericardial fluid, thickening or pericardial calcification. Left-sided biventricular pacemaker/AICD with lead tips terminating in the right atrial appendage, right ventricular apex, and overlying the lateral wall the left ventricle via the coronary sinus and coronary veins. Right internal jugular central venous catheter with tip terminating in the distal superior vena  cava. Intra aortic balloon pump in position the tip terminating in the proximal descending thoracic aorta. Right upper extremity PICC with tip terminating in the distal superior vena cava. No pathologically enlarged mediastinal or hilar lymph nodes. Please note that accurate exclusion of hilar adenopathy is limited on noncontrast CT scans. Esophagus is unremarkable in appearance. No axillary lymphadenopathy. Lungs/Pleura: Moderate bilateral pleural effusions lying dependently with extensive passive atelectasis in  the dependent portions of the lower lobes of the lungs bilaterally no acute consolidative airspace disease. No suspicious appearing pulmonary nodules or masses. Musculoskeletal/Soft Tissues: Bilateral gynecomastia. Diffuse body wall edema. There are no aggressive appearing lytic or blastic lesions noted in the visualized portions of the skeleton. CT ABDOMEN AND PELVIS FINDINGS Hepatobiliary: Several small sub cm low-attenuation liver lesions are noted, incompletely characterized on today's noncontrast CT examination, but likely to represent small cysts. Gallbladder is normal in appearance. Pancreas: No definite pancreatic mass or peripancreatic inflammatory changes are noted on today's noncontrast CT examination. Spleen: Unremarkable. Adrenals/Urinary Tract: Unenhanced appearance of bilateral adrenal glands and bilateral kidneys is unremarkable. No hydroureteronephrosis. Urinary bladder is nearly completely decompressed with an indwelling Foley catheter. Small amount of gas in the urinary bladder, presumably iatrogenic. Stomach/Bowel: Unenhanced appearance of the stomach is normal. No pathologic dilatation of small bowel or colon. Vascular/Lymphatic: Intra-aortic balloon pump in position, as previously discussed, placed via right femoral approach. Atherosclerotic calcifications in the abdominal and pelvic vasculature appear to be rather mild. No definite aneurysm. No lymphadenopathy noted in the abdomen or  pelvis on today's noncontrast CT examination. Reproductive: Prostate gland is enlarged measuring 5.8 x 6.3 cm. Seminal vesicles are unremarkable in appearance. Other: Mild diffuse mesenteric edema. Trace volume of ascites. No pneumoperitoneum. Musculoskeletal: Diffuse body wall edema. There are no aggressive appearing lytic or blastic lesions noted in the visualized portions of the skeleton. IMPRESSION: 1. No anatomic constraints to placement of left ventricular assist device. However, there is a paucity of subcutaneous fat in this individual, which may impact drive line placement in the upper abdomen. 2. Mild cardiomegaly. 3. Atherosclerosis, including left main and left anterior descending coronary artery disease. Please note that although the presence of coronary artery calcium documents the presence of coronary artery disease, the severity of this disease and any potential stenosis cannot be assessed on this non-gated CT examination. Assessment for potential risk factor modification, dietary therapy or pharmacologic therapy may be warranted, if clinically indicated. 4. Moderate bilateral pleural effusions, diffuse mesenteric edema, small volume of ascites and diffuse body wall edema; imaging findings suggestive of a state of anasarca. 5. Additional incidental findings, as above. Electronically Signed   By: Trudie Reed M.D.   On: 09/22/2015 14:04   Dg Chest Port 1 View  09/24/2015  CLINICAL DATA:  Postop day 1 left ventricular assist device implantation. EXAM: PORTABLE CHEST 1 VIEW COMPARISON:  10-19-2015 and earlier. FINDINGS: Left ventricular assist device. Left subclavian biventricular pacing defibrillator. Endotracheal tube tip in satisfactory position approximately 4 cm above the carina. Nasogastric tube courses below the diaphragm into the stomach. Swan-Ganz catheter tip in the proximal right main pulmonary artery. Bilateral chest tubes in place with no pneumothorax. Cardiac silhouette markedly  enlarged, unchanged. 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 Atelectasis in the lung bases, slightly worse than yesterday. Small bilateral pleural effusions. IMPRESSION: 1. Support apparatus satisfactory. 2. No pneumothorax. 3. Bibasilar atelectasis and small bilateral pleural effusions. 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 Electronically Signed   By: Hulan Saas M.D.   On: 09/24/2015 08:21   Dg Chest Port 1 View  10/19/2015  CLINICAL DATA:  LVAD EXAM: PORTABLE CHEST 1 VIEW COMPARISON:  09/20/2015.  CT 09/22/2015 FINDINGS: Endotracheal tube is in place with the tip 3.8 cm above the carina. LVAD is partially imaged. There is mild cardiomegaly. Left AICD is unchanged. Intra aortic balloon pop slightly retracted within the proximal descending thoracic aorta. Bilateral chest tube in place. No pneumothorax. Bibasilar  atelectasis. Mild vascular congestion. IMPRESSION: Bilateral chest tubes. No pneumothorax. Intra aortic balloon pump slightly retracted but remains in the proximal descending thoracic aorta. Bibasilar atelectasis. LVAD partially imaged. Electronically Signed   By: Charlett Nose M.D.   On: 09/01/2015 15:53      Medications:     Scheduled Medications: . acetaminophen  1,000 mg Oral 4 times per day   Or  . acetaminophen (TYLENOL) oral liquid 160 mg/5 mL  1,000 mg Per Tube 4 times per day  . albumin human  12.5 g Intravenous Q6H  . antiseptic oral rinse  7 mL Mouth Rinse QID  . aspirin EC  325 mg Oral Daily   Or  . aspirin  324 mg Per Tube Daily   Or  . aspirin  300 mg Rectal Daily  . bisacodyl  10 mg Oral Daily   Or  . bisacodyl  10 mg Rectal Daily  . docusate sodium  200 mg Oral Daily  . insulin aspart  0-24 Units Subcutaneous 6 times per day  . insulin aspart  0-24 Units Subcutaneous 6 times per day  . metoCLOPramide (REGLAN) injection  10 mg Intravenous 4 times per day  . [START ON 09/25/2015] pantoprazole  40 mg Oral Daily  .  piperacillin-tazobactam (ZOSYN)  IV  3.375 g Intravenous 3 times per day  . rifampin  600 mg Oral Once  . sodium chloride  3 mL Intravenous Q12H  . vancomycin  1,000 mg Intravenous Q24H  . warfarin  2 mg Oral q1800  . Warfarin - Physician Dosing Inpatient   Does not apply q1800     Infusions: . sodium chloride 20 mL/hr at 09/24/15 0900  . sodium chloride    . sodium chloride 20 mL/hr at 09/24/15 0800  . dexmedetomidine 0.5 mcg/kg/hr (09/24/15 0900)  . EPINEPHrine 4 mg in dextrose 5% 250 mL infusion (16 mcg/mL) 3 mcg/min (09/24/15 0900)  . lactated ringers 20 mL/hr at 09/24/15 0900  . lactated ringers Stopped (09/24/15 0700)  . milrinone 0.3 mcg/kg/min (09/24/15 0900)  . nitroGLYCERIN Stopped (09/08/2015 1500)  . norepinephrine (LEVOPHED) Adult infusion Stopped (09/08/2015 1500)     PRN Medications:  sodium chloride, albumin human, hydrALAZINE, midazolam, morphine injection, ondansetron (ZOFRAN) IV, oxyCODONE, sodium chloride, traMADol   Assessment:  Stable with mild-mod RV dysfunction Wean NO slowly Start low dose coumadin- bilirubin > 4 Cont milrinone, low dose epi   Plan/Discussion:      I reviewed the LVAD parameters from today, and compared the results to the patient's prior recorded data.  No programming changes were made.  The LVAD is functioning within specified parameters.  The patient performs LVAD self-test daily.  LVAD interrogation was negative for any significant power changes, alarms or PI events/speed drops.  LVAD equipment check completed and is in good working order.  Back-up equipment present.   LVAD education done on emergency procedures and precautions and reviewed exit site care.  Length of Stay: 8  Kathlee Nations [place] 09/24/2015, 9:36 AM

## 2015-09-25 ENCOUNTER — Inpatient Hospital Stay (HOSPITAL_COMMUNITY): Payer: Medicare Other

## 2015-09-25 DIAGNOSIS — I5043 Acute on chronic combined systolic (congestive) and diastolic (congestive) heart failure: Secondary | ICD-10-CM

## 2015-09-25 DIAGNOSIS — Z95811 Presence of heart assist device: Secondary | ICD-10-CM

## 2015-09-25 LAB — PROTIME-INR
INR: 1.48 (ref 0.00–1.49)
Prothrombin Time: 18 seconds — ABNORMAL HIGH (ref 11.6–15.2)

## 2015-09-25 LAB — PHOSPHORUS: Phosphorus: 4.2 mg/dL (ref 2.5–4.6)

## 2015-09-25 LAB — POCT I-STAT, CHEM 8
BUN: 19 mg/dL (ref 6–20)
CALCIUM ION: 1.16 mmol/L (ref 1.13–1.30)
Chloride: 113 mmol/L — ABNORMAL HIGH (ref 101–111)
Creatinine, Ser: 2.2 mg/dL — ABNORMAL HIGH (ref 0.61–1.24)
Glucose, Bld: 113 mg/dL — ABNORMAL HIGH (ref 65–99)
HCT: 35 % — ABNORMAL LOW (ref 39.0–52.0)
HEMOGLOBIN: 11.9 g/dL — AB (ref 13.0–17.0)
Potassium: 3.5 mmol/L (ref 3.5–5.1)
SODIUM: 145 mmol/L (ref 135–145)
TCO2: 20 mmol/L (ref 0–100)

## 2015-09-25 LAB — POCT I-STAT 3, ART BLOOD GAS (G3+)
ACID-BASE DEFICIT: 2 mmol/L (ref 0.0–2.0)
ACID-BASE DEFICIT: 1 mmol/L (ref 0.0–2.0)
Acid-base deficit: 1 mmol/L (ref 0.0–2.0)
BICARBONATE: 22.6 meq/L (ref 20.0–24.0)
Bicarbonate: 23.6 mEq/L (ref 20.0–24.0)
Bicarbonate: 23.1 mEq/L (ref 20.0–24.0)
O2 SAT: 100 %
O2 SAT: 99 %
O2 Saturation: 100 %
PCO2 ART: 36 mmHg (ref 35.0–45.0)
PCO2 ART: 36.5 mmHg (ref 35.0–45.0)
PH ART: 7.393 (ref 7.350–7.450)
PO2 ART: 173 mmHg — AB (ref 80.0–100.0)
PO2 ART: 169 mmHg — AB (ref 80.0–100.0)
PO2 ART: 158 mmHg — AB (ref 80.0–100.0)
Patient temperature: 97.4
Patient temperature: 36.6
Patient temperature: 36.8
TCO2: 25 mmol/L (ref 0–100)
TCO2: 24 mmol/L (ref 0–100)
TCO2: 24 mmol/L (ref 0–100)
pCO2 arterial: 36.9 mmHg (ref 35.0–45.0)
pH, Arterial: 7.421 (ref 7.350–7.450)
pH, Arterial: 7.409 (ref 7.350–7.450)

## 2015-09-25 LAB — GLUCOSE, CAPILLARY
GLUCOSE-CAPILLARY: 115 mg/dL — AB (ref 65–99)
GLUCOSE-CAPILLARY: 122 mg/dL — AB (ref 65–99)
GLUCOSE-CAPILLARY: 129 mg/dL — AB (ref 65–99)
GLUCOSE-CAPILLARY: 140 mg/dL — AB (ref 65–99)
Glucose-Capillary: 115 mg/dL — ABNORMAL HIGH (ref 65–99)
Glucose-Capillary: 121 mg/dL — ABNORMAL HIGH (ref 65–99)
Glucose-Capillary: 84 mg/dL (ref 65–99)

## 2015-09-25 LAB — MAGNESIUM: Magnesium: 2.5 mg/dL — ABNORMAL HIGH (ref 1.7–2.4)

## 2015-09-25 LAB — TYPE AND SCREEN
ABO/RH(D): B POS
Antibody Screen: NEGATIVE
UNIT DIVISION: 0
UNIT DIVISION: 0
UNIT DIVISION: 0
Unit division: 0

## 2015-09-25 LAB — COMPREHENSIVE METABOLIC PANEL
ALT: 23 U/L (ref 17–63)
AST: 64 U/L — ABNORMAL HIGH (ref 15–41)
Albumin: 3 g/dL — ABNORMAL LOW (ref 3.5–5.0)
Alkaline Phosphatase: 60 U/L (ref 38–126)
Anion gap: 9 (ref 5–15)
BUN: 16 mg/dL (ref 6–20)
CO2: 23 mmol/L (ref 22–32)
Calcium: 9 mg/dL (ref 8.9–10.3)
Chloride: 114 mmol/L — ABNORMAL HIGH (ref 101–111)
Creatinine, Ser: 1.97 mg/dL — ABNORMAL HIGH (ref 0.61–1.24)
GFR calc Af Amer: 37 mL/min — ABNORMAL LOW (ref >60)
GFR calc non Af Amer: 32 mL/min — ABNORMAL LOW (ref >60)
Glucose, Bld: 125 mg/dL — ABNORMAL HIGH (ref 65–99)
Potassium: 3.7 mmol/L (ref 3.5–5.1)
Sodium: 146 mmol/L — ABNORMAL HIGH (ref 135–145)
Total Bilirubin: 1.7 mg/dL — ABNORMAL HIGH (ref 0.3–1.2)
Total Protein: 5.5 g/dL — ABNORMAL LOW (ref 6.5–8.1)

## 2015-09-25 LAB — CBC WITH DIFFERENTIAL/PLATELET
Basophils Absolute: 0 10*3/uL (ref 0.0–0.1)
Basophils Relative: 0 %
Eosinophils Absolute: 0.4 10*3/uL (ref 0.0–0.7)
Eosinophils Relative: 3 %
HCT: 28.6 % — ABNORMAL LOW (ref 39.0–52.0)
Hemoglobin: 10.1 g/dL — ABNORMAL LOW (ref 13.0–17.0)
Lymphocytes Relative: 4 %
Lymphs Abs: 0.5 10*3/uL — ABNORMAL LOW (ref 0.7–4.0)
MCH: 29.2 pg (ref 26.0–34.0)
MCHC: 35.3 g/dL (ref 30.0–36.0)
MCV: 82.7 fL (ref 78.0–100.0)
Monocytes Absolute: 1.5 10*3/uL — ABNORMAL HIGH (ref 0.1–1.0)
Monocytes Relative: 12 %
Neutro Abs: 9.9 10*3/uL — ABNORMAL HIGH (ref 1.7–7.7)
Neutrophils Relative %: 81 %
Platelets: 158 10*3/uL (ref 150–400)
RBC: 3.46 MIL/uL — ABNORMAL LOW (ref 4.22–5.81)
RDW: 15.5 % (ref 11.5–15.5)
WBC: 12.3 10*3/uL — ABNORMAL HIGH (ref 4.0–10.5)

## 2015-09-25 LAB — FACTOR 5 LEIDEN

## 2015-09-25 LAB — CARBOXYHEMOGLOBIN
Carboxyhemoglobin: 1.5 % (ref 0.5–1.5)
Carboxyhemoglobin: 1.6 % — ABNORMAL HIGH (ref 0.5–1.5)
Methemoglobin: 1.3 % (ref 0.0–1.5)
Methemoglobin: 1 % (ref 0.0–1.5)
O2 Saturation: 79.2 %
O2 Saturation: 73.7 %
Total hemoglobin: 10.5 g/dL — ABNORMAL LOW (ref 13.5–18.0)
Total hemoglobin: 11.5 g/dL — ABNORMAL LOW (ref 13.5–18.0)

## 2015-09-25 LAB — LACTATE DEHYDROGENASE: LDH: 303 U/L — ABNORMAL HIGH (ref 98–192)

## 2015-09-25 LAB — VANCOMYCIN, TROUGH: Vancomycin Tr: 35 ug/mL (ref 10.0–20.0)

## 2015-09-25 MED ORDER — CETYLPYRIDINIUM CHLORIDE 0.05 % MT LIQD
7.0000 mL | Freq: Two times a day (BID) | OROMUCOSAL | Status: DC
  Administered 2015-09-25 (×2): 7 mL via OROMUCOSAL

## 2015-09-25 MED ORDER — WARFARIN SODIUM 3 MG PO TABS
4.0000 mg | ORAL_TABLET | Freq: Every day | ORAL | Status: DC
  Administered 2015-09-25 – 2015-09-26 (×2): 4 mg via ORAL
  Filled 2015-09-25 (×2): qty 0.5

## 2015-09-25 MED ORDER — POTASSIUM CHLORIDE 10 MEQ/50ML IV SOLN
10.0000 meq | INTRAVENOUS | Status: AC
  Administered 2015-09-25 (×3): 10 meq via INTRAVENOUS
  Filled 2015-09-25: qty 50

## 2015-09-25 MED ORDER — FUROSEMIDE 10 MG/ML IJ SOLN
40.0000 mg | Freq: Two times a day (BID) | INTRAMUSCULAR | Status: DC
  Administered 2015-09-25 – 2015-09-26 (×3): 40 mg via INTRAVENOUS
  Filled 2015-09-25 (×3): qty 4

## 2015-09-25 MED ORDER — ENSURE ENLIVE PO LIQD
237.0000 mL | Freq: Two times a day (BID) | ORAL | Status: DC
  Administered 2015-09-25: 237 mL via ORAL

## 2015-09-25 MED ORDER — POTASSIUM CHLORIDE 10 MEQ/50ML IV SOLN
10.0000 meq | INTRAVENOUS | Status: DC
  Filled 2015-09-25: qty 50

## 2015-09-25 MED ORDER — POTASSIUM CHLORIDE 10 MEQ/50ML IV SOLN
10.0000 meq | INTRAVENOUS | Status: AC
  Administered 2015-09-25 (×3): 10 meq via INTRAVENOUS

## 2015-09-25 MED ORDER — METOCLOPRAMIDE HCL 5 MG/ML IJ SOLN
5.0000 mg | Freq: Four times a day (QID) | INTRAMUSCULAR | Status: DC
  Administered 2015-09-25 – 2015-09-27 (×7): 5 mg via INTRAVENOUS
  Filled 2015-09-25 (×8): qty 2

## 2015-09-25 MED ORDER — SILDENAFIL CITRATE 20 MG PO TABS
40.0000 mg | ORAL_TABLET | Freq: Three times a day (TID) | ORAL | Status: DC
  Administered 2015-09-25 – 2015-10-15 (×61): 40 mg via ORAL
  Filled 2015-09-25 (×66): qty 2

## 2015-09-25 NOTE — Progress Notes (Signed)
Wean protocol initiated. 

## 2015-09-25 NOTE — Evaluation (Signed)
Physical Therapy Evaluation Patient Details Name: Brad [name] MRN: [id number] DOB: 1941-06-09 Today's Date: 09/25/2015   History of Present Illness  74 y/o male with h/o PAF, previous CVA, HTN, HL, V. 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 Jude CTR-D (initial ICD 2007 with upgrade to CRT in 2015). Pt s/p LVAD placement 12/28.  Clinical Impression  Pt tolerated EOB and LE there ex well for POD #2. Suspect pt to progress well and to be able to return home with spouse once medically stable. PT to attempt ambulation tomorrow as able.    Follow Up Recommendations Home health PT;Supervision/Assistance - 24 hour    Equipment Recommendations  Rolling walker with 5" wheels;3in1 (PT)    Recommendations for Other Services       Precautions / Restrictions Precautions Precautions: Sternal Precaution Comments: pt with verbal understanding Restrictions Weight Bearing Restrictions: Yes Other Position/Activity Restrictions: sternal precautions      Mobility  Bed Mobility Overal bed mobility: Needs Assistance Bed Mobility: Supine to Sit;Sit to Supine     Supine to sit: Mod assist;+2 for physical assistance;+2 for safety/equipment Sit to supine: Mod assist;+2 for physical assistance;+2 for safety/equipment   General bed mobility comments: +2 for line mangement, and trunk elevation to sit EOB, assist for LE management back into bed  Transfers                 General transfer comment: limited to dangle this AM  Ambulation/Gait                Stairs            Wheelchair Mobility    Modified Rankin (Stroke Patients Only)       Balance Overall balance assessment: Needs assistance Sitting-balance support: Feet supported;Bilateral upper extremity supported Sitting balance-Leahy Scale: Fair Sitting balance - Comments: posterior lean, minA to help maintain upright                                     Pertinent  Vitals/Pain Pain Assessment: No/denies pain    Home Living Family/patient expects to be discharged to:: Private residence Living Arrangements: Spouse/significant other Available Help at Discharge: Family;Available 24 hours/day Type of Home: House Home Access: Stairs to enter Entrance Stairs-Rails: None Entrance Stairs-Number of Steps: 3 Home Layout: One level Home Equipment: Walker - standard      Prior Function Level of Independence: Independent               Hand Dominance        Extremity/Trunk Assessment   Upper Extremity Assessment: Generalized weakness (no formal MMT due to precautions)           Lower Extremity Assessment: Generalized weakness (mild LE edema bilat)      Cervical / Trunk Assessment:  (LVAD placement)  Communication   Communication: No difficulties (just soft spoken from surgery)  Cognition Arousal/Alertness: Awake/alert Behavior During Therapy: WFL for tasks assessed/performed Overall Cognitive Status: Within Functional Limits for tasks assessed                      General Comments General comments (skin integrity, edema, etc.): pt tolerated sitting x 8 min    Exercises General Exercises - Lower Extremity Ankle Circles/Pumps: AROM;Both;10 reps;Supine Long Arc Quad: AROM;Both;10 reps;Seated Heel Slides: AAROM;Both;10 reps;Supine      Assessment/Plan    PT  Assessment Patient needs continued PT services  PT Diagnosis Difficulty walking;Generalized weakness   PT Problem List Decreased strength;Decreased range of motion;Decreased activity tolerance;Decreased balance;Decreased mobility  PT Treatment Interventions DME instruction;Gait training;Stair training;Functional mobility training;Therapeutic activities;Therapeutic exercise   PT Goals (Current goals can be found in the Care Plan section) Acute Rehab PT Goals Patient Stated Goal: to get better PT Goal Formulation: With patient Time For Goal Achievement:  10/20/2015 Potential to Achieve Goals: Good Additional Goals Additional Goal #1: Pt to be indep with mangement of LVAD.    Frequency Min 3X/week   Barriers to discharge        Co-evaluation               End of Session Equipment Utilized During Treatment: Oxygen Activity Tolerance: Patient tolerated treatment well Patient left: in bed;with call bell/phone within reach;with bed alarm set;with nursing/sitter in room Nurse Communication: Mobility status (RN present)         Time: 1607-3710 PT Time Calculation (min) (ACUTE ONLY): 28 min   Charges:   PT Evaluation $Initial PT Evaluation Tier I: 1 Procedure PT Treatments $Therapeutic Activity: 8-22 mins   PT G CodesMarcene Brawn 09/25/2015, 2:18 PM  Lewis Shock, PT, DPT Pager #: [phone] Office #: [phone]

## 2015-09-25 NOTE — Progress Notes (Signed)
HeartMate 2 Rounding Note POD#2 Heartmate 2 implantation   Subjective:   Class 4 CHF with cardiogenic shock, hx nonischemic    Cardiomyopathy Preop IABP Hx BPH, TURP and bladder outlet obstruction required cystoscopy to place foley\ Preop severe protein loss malnutrition, prealbumin < 10 Preop acute on chronic renal failure Preop guaiac + stool Preop chronic Eliquis for a-fib  Patient had stable night Pump parameters at goal on low dose epi, norepi , 0.3 Mil - co-ox 79 % A-V pacing at 90 Excellent diuresis, creat at 1.9 Vent weaned an patient extubated Will DC swan and mobilize OOB   LVAD INTERROGATION:  HeartMate II LVAD:  Flow 4.4 liters/min, speed 8800, power 5.2, PI 6.5  Controller intact  Objective:    Vital Signs:   Temp:  [96.1 F (35.6 C)-97.5 F (36.4 C)] 97.4 F (36.3 C) (12/30 0400) Pulse Rate:  [73-91] 90 (12/30 0715) Resp:  [12-24] 22 (12/30 0715) BP: (75-104)/(67-78) 90/72 mmHg (12/30 0300) SpO2:  [99 %-100 %] 99 % (12/30 0715) Arterial Line BP: (73-110)/(68-80) 90/70 mmHg (12/30 0715) FiO2 (%):  [40 %] 40 % (12/30 0651) Weight:  [155 lb 3.3 oz (70.4 kg)] 155 lb 3.3 oz (70.4 kg) (12/30 0400) Last BM Date: 09/22/15 Mean arterial Pressure 70-75  Intake/Output:   Intake/Output Summary (Last 24 hours) at 09/25/15 0805 Last data filed at 09/25/15 0700  Gross per 24 hour  Intake 2760.71 ml  Output   5525 ml  Net -2764.29 ml     Physical Exam: General:  Well appearing. No resp difficulty HEENT: normal Neck: supple. no JVP  No carotid pulses; no bruits. No lymphadenopathy or thryomegaly appreciated. Cor: Mechanical heart sounds with LVAD hum present. Lungs: clear Abdomen: soft, nontender, nondistended. No hepatosplenomegaly. No bruits or masses. Good bowel sounds. Extremities: no cyanosis, clubbing, rash, mild extremity  edema Neuro: alert & orientedx3, cranial nerves grossly intact. t  Telemetry: paced rhythm 90  Labs: Basic Metabolic  Panel:  Recent Labs Lab 09/22/15 0700 Oct 16, 2015 0534  10/19/2015 1600 09/28/2015 2100 10/23/2015 2107 09/24/15 0450 09/24/15 1615 09/24/15 1625 09/25/15 0340  NA 147* 144  < > 148*  --  149* 147*  --  147* 146*  K 3.9 3.5  < > 2.9*  --  3.3* 4.0  --  3.8 3.7  CL 123* 119*  < > 118*  --  116* 117*  --  115* 114*  CO2 18* 19*  --  22  --   --  20*  --   --  23  GLUCOSE 113* 140*  < > 114*  --  121* 131*  --  147* 125*  BUN 19 17  < > 13  --  13 13  --  16 16  CREATININE 1.61* 1.67*  < > 1.42* 1.44* 1.20 1.40* 1.81* 1.60* 1.97*  CALCIUM 7.8* 7.9*  --  7.5*  --   --  8.3*  --   --  9.0  MG  --   --   --  1.8 2.7*  --  2.4 2.4  --  2.5*  PHOS  --   --   --   --   --   --  3.0  --   --  4.2  < > = values in this interval not displayed.  Liver Function Tests:  Recent Labs Lab 10/05/2015 0534 09/24/15 0450 09/25/15 0340  AST 51* 84* 64*  ALT 34 28 23  ALKPHOS 97 66 60  BILITOT 0.5 4.3*  1.7*  PROT 4.7* 5.5* 5.5*  ALBUMIN 1.6* 3.2* 3.0*   No results for input(s): LIPASE, AMYLASE in the last 168 hours. No results for input(s): AMMONIA in the last 168 hours.  CBC:  Recent Labs Lab 09/02/2015 1600 09/22/2015 2100 09/18/2015 2107 09/24/15 0450 09/24/15 1615 09/24/15 1625 09/25/15 0340  WBC 8.9 8.5  --  10.3 10.7*  --  12.3*  NEUTROABS  --   --   --  8.0*  --   --  9.9*  HGB 8.5* 7.3* 7.1* 11.6* 10.4* 10.5* 10.1*  HCT 23.4* 21.2* 21.0* 33.7* 29.5* 31.0* 28.6*  MCV 82.7 84.1  --  83.8 82.4  --  82.7  PLT 161 148*  --  146* 147*  --  158    INR:  Recent Labs Lab 09/22/15 1130 08/31/2015 1530 09/02/2015 1600 09/24/15 0450 09/25/15 0340  INR 1.33 1.45 1.42 1.21 1.48    Other results:  EKG:   Imaging: Dg Chest Port 1 View  09/25/2015  CLINICAL DATA:  Left ventricular assist device. EXAM: PORTABLE CHEST 1 VIEW COMPARISON:  09/24/2015 FINDINGS: Endotracheal tube is 2.1 cm above the carina. Patient is markedly rotated towards the left on this examination which limits evaluation  of the lungs. Left cardiac ICD. Nasogastric tube extends into the abdomen. There continues to be hazy densities in the lower chest on both sides. Evidence for a mediastinal and bilateral chest tubes. No large pneumothorax. Central line tip is near the superior cavoatrial junction. IMPRESSION: Limited examination due to patient positioning. Support apparatuses appear to be appropriately positioned. No evidence for a pneumothorax. Persistent densities in the lower lungs bilaterally. Findings may be related to volume loss and/or airspace disease. Cannot exclude pleural fluid despite the chest tubes. Electronically Signed   By: Richarda Overlie M.D.   On: 09/25/2015 07:59   Dg Chest Port 1 View  09/24/2015  CLINICAL DATA:  Postop day 1 left ventricular assist device implantation. EXAM: PORTABLE CHEST 1 VIEW COMPARISON:  09/03/2015 and earlier. FINDINGS: Left ventricular assist device. Left subclavian biventricular pacing defibrillator. Endotracheal tube tip in satisfactory position approximately 4 cm above the carina. Nasogastric tube courses below the diaphragm into the stomach. Swan-Ganz catheter tip in the proximal right main pulmonary artery. Bilateral chest tubes in place with no pneumothorax. Cardiac silhouette markedly enlarged, unchanged. 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 Atelectasis in the lung bases, slightly worse than yesterday. Small bilateral pleural effusions. IMPRESSION: 1. Support apparatus satisfactory. 2. No pneumothorax. 3. Bibasilar atelectasis and small bilateral pleural effusions. 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 Electronically Signed   By: Hulan Saas M.D.   On: 09/24/2015 08:21   Dg Chest Port 1 View  08/28/2015  CLINICAL DATA:  LVAD EXAM: PORTABLE CHEST 1 VIEW COMPARISON:  09/20/2015.  CT 09/22/2015 FINDINGS: Endotracheal tube is in place with the tip 3.8 cm above the carina. LVAD is partially imaged. There is mild cardiomegaly. Left AICD is unchanged.  Intra aortic balloon pop slightly retracted within the proximal descending thoracic aorta. Bilateral chest tube in place. No pneumothorax. Bibasilar atelectasis. Mild vascular congestion. IMPRESSION: Bilateral chest tubes. No pneumothorax. Intra aortic balloon pump slightly retracted but remains in the proximal descending thoracic aorta. Bibasilar atelectasis. LVAD partially imaged. Electronically Signed   By: Charlett Nose M.D.   On: 09/20/2015 15:53     Medications:     Scheduled Medications: . acetaminophen  1,000 mg Oral 4 times per day   Or  . acetaminophen (TYLENOL) oral liquid 160  mg/5 mL  1,000 mg Per Tube 4 times per day  . antiseptic oral rinse  7 mL Mouth Rinse QID  . aspirin EC  325 mg Oral Daily   Or  . aspirin  324 mg Per Tube Daily   Or  . aspirin  300 mg Rectal Daily  . bisacodyl  10 mg Oral Daily   Or  . bisacodyl  10 mg Rectal Daily  . docusate sodium  200 mg Oral Daily  . feeding supplement (ENSURE ENLIVE)  237 mL Oral BID BM  . furosemide  40 mg Intravenous BID  . insulin aspart  0-24 Units Subcutaneous 6 times per day  . metoCLOPramide (REGLAN) injection  5 mg Intravenous 4 times per day  . pantoprazole  40 mg Oral Daily  . piperacillin-tazobactam (ZOSYN)  IV  3.375 g Intravenous 3 times per day  . potassium chloride  10 mEq Intravenous Q1 Hr x 3  . sildenafil  20 mg Oral TID  . sodium chloride  10 mL Intravenous Q12H  . sodium chloride  3 mL Intravenous Q12H  . vancomycin  1,000 mg Intravenous Q24H  . warfarin  4 mg Oral q1800  . Warfarin - Physician Dosing Inpatient   Does not apply q1800    Infusions: . sodium chloride 20 mL/hr at 09/25/15 0700  . sodium chloride    . sodium chloride 20 mL/hr at 09/07/2015 1900  . amiodarone 30 mg/hr (09/25/15 0700)  . EPINEPHrine 4 mg in dextrose 5% 250 mL infusion (16 mcg/mL) 2 mcg/min (09/25/15 0700)  . lactated ringers 20 mL/hr at 09/25/15 0700  . lactated ringers Stopped (09/24/15 0700)  . milrinone 0.3 mcg/kg/min  (09/25/15 0700)  . norepinephrine (LEVOPHED) Adult infusion 1 mcg/min (09/25/15 0700)    PRN Medications: sodium chloride, hydrALAZINE, ondansetron (ZOFRAN) IV, oxyCODONE, sodium chloride, traMADol   Assessment:  Stable with mild-mod RV dysfunction Cont mil and low dose epi and norepi until renal fx stablilized Start low dose coumadin-  4 mg Cont  Lasix- CXR wet DC swan and OOB to chair  Plan/Discussion:      I reviewed the LVAD parameters from today, and compared the results to the patient's prior recorded data.  No programming changes were made.  The LVAD is functioning within specified parameters.  The patient performs LVAD self-test daily.  LVAD interrogation was negative for any significant power changes, alarms or PI events/speed drops.  LVAD equipment check completed and is in good working order.  Back-up equipment present.   LVAD education done on emergency procedures and precautions and reviewed exit site care.  Length of Stay: [address]  Kathlee Nations [place] III 09/25/2015, 8:05 AM

## 2015-09-25 NOTE — Evaluation (Signed)
Occupational Therapy Evaluation Patient Details Name: Brad [name] MRN: [id number] DOB: 09/29/1940 Today's Date: 09/25/2015    History of Present Illness 74 y/o male with h/o PAF, previous CVA, HTN, HL, V. 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 Jude CTR-D (initial ICD 2007 with upgrade to CRT in 2015). Pt s/p LVAD placement 12/28.   Clinical Impression   Pt admitted with above. He demonstrates the below listed deficits and will benefit from continued OT to maximize safety and independence with BADLs.  Pt transferred to chair with mod A +2.  He is very slow to process info and slow to initiate activity.  Currently, he requires max - total A for ADLs.  Anticipate he will need post acute rehab at discharge - optimally CIR.   PI 6.5 at beginning of session and decreased to 4.3 after sitting in chair.  Pt denies dizziness.  All other VSS.       Follow Up Recommendations  CIR;Supervision/Assistance - 24 hour    Equipment Recommendations  3 in 1 bedside comode;Tub/shower seat    Recommendations for Other Services       Precautions / Restrictions Precautions Precautions: Sternal;Other (comment) (LVAD) Precaution Comments: Pt instructed in sternal precautions.    Restrictions Weight Bearing Restrictions: Yes Other Position/Activity Restrictions: sternal precautions      Mobility Bed Mobility Overal bed mobility: Needs Assistance Bed Mobility: Supine to Sit     Supine to sit: Total assist     General bed mobility comments: +2 for lines  Transfers Overall transfer level: Needs assistance Equipment used: None Transfers: Sit to/from [employer] Transfers Sit to Stand: Mod assist;+2 physical assistance Stand pivot transfers: Mod assist;+2 physical assistance       General transfer comment: Pt requires assist to power up into standing.  Unable to achieve full upright posture - facilitation to extend hips     Balance Overall balance  assessment: Needs assistance Sitting-balance support: Feet supported Sitting balance-Leahy Scale: Poor Sitting balance - Comments: Pt with posterior lean.  Requries min A to maintain sitting balance EOB    Standing balance support: Bilateral upper extremity supported Standing balance-Leahy Scale: Poor                              ADL Overall ADL's : Needs assistance/impaired Eating/Feeding: Minimal assistance;Sitting   Grooming: Wash/dry hands;Wash/dry face;Brushing hair;Oral care;Moderate assistance;Sitting   Upper Body Bathing: Maximal assistance;Sitting   Lower Body Bathing: Total assistance;Sit to/from stand   Upper Body Dressing : Total assistance;Sitting   Lower Body Dressing: Total assistance;Sit to/from stand   Toilet Transfer: Moderate assistance;+2 for physical assistance;Stand-pivot;BSC   Toileting- Clothing Manipulation and Hygiene: Total assistance;Sit to/from stand       Functional mobility during ADLs: Moderate assistance;+2 for physical assistance       Vision     Perception     Praxis      Pertinent Vitals/Pain Pain Assessment: No/denies pain     Hand Dominance     Extremity/Trunk Assessment Upper Extremity Assessment Upper Extremity Assessment: Generalized weakness (grossly 3/5)   Lower Extremity Assessment Lower Extremity Assessment: Defer to PT evaluation   Cervical / Trunk Assessment Cervical / Trunk Assessment: Kyphotic   Communication Communication Communication: No difficulties (voice with low volume)   Cognition Arousal/Alertness: Awake/alert Behavior During Therapy: Flat affect Overall Cognitive Status: Impaired/Different from baseline Area of Impairment: Attention;Following commands;Problem solving   Current Attention  Level: Sustained   Following Commands: Follows one step commands with increased time     Problem Solving: Slow processing;Decreased initiation     General Comments       Exercises  Exercises: General Upper Extremity     Shoulder Instructions      Home Living Family/patient expects to be discharged to:: Private residence Living Arrangements: Spouse/significant other Available Help at Discharge: Family;Available 24 hours/day Type of Home: House Home Access: Stairs to enter [employer] of Steps: 3 Entrance Stairs-Rails: None Home Layout: One level     Bathroom Shower/Tub: Tub/shower unit;Walk-in shower   Bathroom Toilet: Standard     Home Equipment: Walker - standard          Prior Functioning/Environment Level of Independence: Independent             OT Diagnosis: Generalized weakness;Cognitive deficits   OT Problem List: Decreased strength;Decreased activity tolerance;Impaired balance (sitting and/or standing);Decreased cognition;Decreased safety awareness;Decreased knowledge of use of DME or AE;Decreased knowledge of precautions;Cardiopulmonary status limiting activity   OT Treatment/Interventions: Self-care/ADL training;Therapeutic exercise;DME and/or AE instruction;Therapeutic activities;Cognitive remediation/compensation;Patient/family education;Balance training;Energy conservation    OT Goals(Current goals can be found in the care plan section) Acute Rehab OT Goals Patient Stated Goal: did not state  OT Goal Formulation: With patient Time For Goal Achievement: Oct 13, 2015 Potential to Achieve Goals: Good ADL Goals Pt Will Perform Eating: Independently;sitting Pt Will Perform Grooming: standing;with min assist Pt Will Perform Upper Body Bathing: sitting;with supervision Pt Will Perform Lower Body Bathing: with min assist;sit to/from stand Pt Will Perform Upper Body Dressing: with min assist;sitting Pt Will Perform Lower Body Dressing: with min assist;with adaptive equipment;sit to/from stand Pt Will Transfer to Toilet: with min assist;ambulating;regular height toilet;bedside commode;grab bars Pt Will Perform Toileting - Clothing  Manipulation and hygiene: with min assist;sit to/from stand Pt/caregiver will Perform Home Exercise Program: Increased strength;Right Upper extremity;Left upper extremity;With written HEP provided;With Supervision Additional ADL Goal #1: Pt will manage LVAD lines/equipment with min cues   OT Frequency: Min 3X/week   Barriers to D/C:            Co-evaluation              End of Session Equipment Utilized During Treatment: Oxygen Nurse Communication: Mobility status  Activity Tolerance: Patient limited by fatigue Patient left: in chair;with call bell/phone within reach;with nursing/sitter in room   Time: 0301-3143 OT Time Calculation (min): 33 min Charges:  OT General Charges $OT Visit: 1 Procedure OT Evaluation $Initial OT Evaluation Tier I: 1 Procedure OT Treatments $Therapeutic Activity: 8-22 mins G-Codes:    Belal Scallon M 09/29/15, 3:56 PM

## 2015-09-25 NOTE — Progress Notes (Signed)
CRITICAL VALUE ALERT  Critical value received: vancomycin trough 35 ug/ml   Date of notification:  12/30  Time of notification:  2140  Critical value read back:Yes.    Nurse who received alert:  Sharlet Salina  MD notified (1st page): yes  Responding MD:   clinical pharmacist Harland German [facility]  Time MD responded:  [phone]

## 2015-09-25 NOTE — Care Management Note (Signed)
Case Management Note  Patient Details  Name: Brad [name] MRN: [id number] Date of Birth: April 21, 1941  Subjective/Objective:     Pt lives with spouse in [place], [place].  Referral for home health services made to [facility] in [place], [place], with spouse's approval.                          Expected Discharge Plan:  Home w Home Health Services  Discharge planning Services  CM Consult  Choice offered to:  Spouse  HH Arranged:  RN [facility] Agency:  Other - See comment  Status of Service:  In process, will continue to follow  Medicare Important Message Given:  Yes  Magdalene River, RN 09/25/2015, 6:14 AM

## 2015-09-25 NOTE — Progress Notes (Signed)
Utilization review completed.  

## 2015-09-25 NOTE — Progress Notes (Signed)
LVAD Coordinator Advanced Heart Failure Rounds:  Implanted 09/21/2015 with HMII as DT by Dr. Donata Clay.   Doing well this morning. Speed at 8800 with PIs low ~ 3-4 unless awake (~6). Very little pulsatility on waveform with current speed--attempted to increase at the bedside with Dr. Donata Clay to 9000 to assist with elevated PA pressures, however PI's to 2.5. Bleeding controlled with Hgb stable this morning. Drowsy on ventilator answering simple commands. Pain adequately controlled.   Vital signs: HR: 90s AV paced, rate increased yesterday ICD SJM CRT-D (defib therapy off, pacing on) Doppler MAP: 80 - 90s Arterial BP: 95 - 115 / 70s MAPs 80s  CVP: 11 - 15 PA pressures: 47 - 60 / 20s O2 Sat: 100%  Wt:143 (preop) > 161     LVAD interrogation reveals:  Speed: 8800 Flow: 3.5 Power: 4 - 5w PI: 3.4 - 4.0 Alarms:  None since OR Events: none Fixed speed: 8800 Low speed limit: 8200  Back up controller programmed accordingly and is at the bedside. LVAD is functioning within expected parameters. Baseline powers 4.5 - 5w. No programming changes were made this morning.   Drive Line: C/D/I. Aquacel Ag ordered and dressing will be maintained daily. Once patient extubated will start with teaching sterile technique to his wife with support of the RN staff at the bedside. Please try to change dressings during the day so she may observe technique.    Labs:  LDH trend: 372 > 303  INR trend: 1.21 > 1.48  Hgb: 11.6 > 10.1  WBC: 10.7 > 12.3   Co-Ox: 82 > 79%  Creatinine: 1.6 (preop) > 1.4 > 1.97   Antithrombotic Management: 09/24/15--> 325 mg ASA  09/24/15--> Warfarin started   Infusions: Epi 2 Milrinone 0.3 Levo +/-  Amio 30/h   OR Blood Products: 4 u FFP 2 u Platelets Cell saver  ICU Blood Products:  09/11/2015--> 2 FFP, 2 pRBC (Hgb 7).   Ventilator: Extubated 09/25/15 to [place] with NO 3PPM  Plan/Recommendations:  1. FD ASA started today along with  coumadin per Dr. Zenaida Niece Trigt--will decrease to 81 mg once INR goal of at least 2 achieved. + Guiac stool pre-op psb due to bleeding from urethra, hemorrhoid or benign cause. Continue to monitor with serial Hgb and will consider throughout recovery period if further intervention/investigation needed.   2. His wife has been working on sterile technique to prepare for dressing changes.   3. Early mobilization once able to prevent deconditioning with age.   Rexene Alberts, BSN, RN, CCRN  VAD Coordinator   Office: [phone] 24/7 VAD Pager: [phone]

## 2015-09-25 NOTE — Progress Notes (Signed)
CT surgery p.m. Rounds  Patient was extubated today now is out of bed up in chair Stable VAD pump parameters and hemodynamics Continues with good diuresis Weaning off inhaled nitric oxide as sildenafil dose is increased We'll leave patient on low-dose epinephrine-norepinephrine for RV function tonight

## 2015-09-25 NOTE — Progress Notes (Signed)
Advanced Heart Failure Rounding Note HeartMate 2 Rounding Note  Subjective:    Admitted from [facility] with cardiogenic shock for advanced heart failure as INTERMACS-1.  Milrinone started 12/21. On 12/22 foley placed by Urology requiring urethral dilation -> UTI . ? Septic shock. On vanco/zosyn. IABP placed 12/23.   S/p HM 2 LVAD placement 09/11/2015. Balloon pump removed 09/07/2015 without hematoma.  Creatine trended up 1.97 this am.  INR 1.48, LDH 303. On 325 ASA, Coumadin started 09/24/15. Hgb 10.1 this am. CVP 8-9. Coox 79.2%. PA 49/16  Remains on milrinone 0.3, Norepi 1, Epi 2, Amio 30, and NO 2.5 but weaning as able. Out 2.5L x 24 hrs on 40 mg IV lasix q 8 hrs. Down 7 lbs by bed weight. Remains intubated this am with plans to extubate today. Responds to simple questioning. Denies SOB or pain.   LVAD INTERROGATION:  HeartMate II LVAD:  Flow 3.9 liters/min, speed 8800, power 4.5, PI 5.9.  1 PI event  Objective:    Vital Signs:   Temp:  [96.1 F (35.6 C)-97.5 F (36.4 C)] 97.4 F (36.3 C) (12/30 0400) Pulse Rate:  [73-91] 90 (12/30 0700) Resp:  [12-24] 24 (12/30 0700) BP: (75-104)/(67-78) 90/72 mmHg (12/30 0300) SpO2:  [99 %-100 %] 99 % (12/30 0700) Arterial Line BP: (73-110)/(68-80) 91/70 mmHg (12/30 0700) FiO2 (%):  [40 %] 40 % (12/30 0651) Weight:  [155 lb 3.3 oz (70.4 kg)] 155 lb 3.3 oz (70.4 kg) (12/30 0400) Last BM Date: 09/22/15 Mean arterial Pressure 70-80s (75-82 this am)  Intake/Output:   Intake/Output Summary (Last 24 hours) at 09/25/15 0706 Last data filed at 09/25/15 0700  Gross per 24 hour  Intake 2927.56 ml  Output   5865 ml  Net -2937.44 ml     Physical Exam: General: Remains intubated, more alert this am, nods to questioning. HEENT: normal Neck: supple.  Carotids 2+ bilat; no bruits. No thryomegaly or nodule noted. Cor: PMI nondisplaced. RRR. LVAD hum present. Lungs: Clear, mechanical breath sounds. Abdomen: soft, NT, ND, no HSM. No bruits or  masses. +BS  Extremities: no cyanosis, clubbing, rash. RUE PICC, LUE radial arterial line. 1-2+ edema into thighs. SCDs present Neuro: alert & orientedx3, cranial nerves grossly intact. moves all 4 extremities w/o difficulty. Affect pleasant GU: Foley - pinkish urine.   Telemetry: AV paced 90s   Labs: Basic Metabolic Panel:  Recent Labs Lab 09/22/15 0700 09/21/2015 0534  08/28/2015 1600 08/27/2015 2100 09/22/2015 2107 09/24/15 0450 09/24/15 1615 09/24/15 1625 09/25/15 0340  NA 147* 144  < > 148*  --  149* 147*  --  147* 146*  K 3.9 3.5  < > 2.9*  --  3.3* 4.0  --  3.8 3.7  CL 123* 119*  < > 118*  --  116* 117*  --  115* 114*  CO2 18* 19*  --  22  --   --  20*  --   --  23  GLUCOSE 113* 140*  < > 114*  --  121* 131*  --  147* 125*  BUN 19 17  < > 13  --  13 13  --  16 16  CREATININE 1.61* 1.67*  < > 1.42* 1.44* 1.20 1.40* 1.81* 1.60* 1.97*  CALCIUM 7.8* 7.9*  --  7.5*  --   --  8.3*  --   --  9.0  MG  --   --   --  1.8 2.7*  --  2.4 2.4  --  2.5*  PHOS  --   --   --   --   --   --  3.0  --   --  4.2  < > = values in this interval not displayed.  Liver Function Tests:  Recent Labs Lab 09/02/2015 0534 09/24/15 0450 09/25/15 0340  AST 51* 84* 64*  ALT 34 28 23  ALKPHOS 97 66 60  BILITOT 0.5 4.3* 1.7*  PROT 4.7* 5.5* 5.5*  ALBUMIN 1.6* 3.2* 3.0*   No results for input(s): LIPASE, AMYLASE in the last 168 hours. No results for input(s): AMMONIA in the last 168 hours.  CBC:  Recent Labs Lab 09/25/2015 1600 09/01/2015 2100 09/15/2015 2107 09/24/15 0450 09/24/15 1615 09/24/15 1625 09/25/15 0340  WBC 8.9 8.5  --  10.3 10.7*  --  12.3*  NEUTROABS  --   --   --  8.0*  --   --  9.9*  HGB 8.5* 7.3* 7.1* 11.6* 10.4* 10.5* 10.1*  HCT 23.4* 21.2* 21.0* 33.7* 29.5* 31.0* 28.6*  MCV 82.7 84.1  --  83.8 82.4  --  82.7  PLT 161 148*  --  146* 147*  --  158    INR:  Recent Labs Lab 09/22/15 1130 08/30/2015 1530 09/10/2015 1600 09/24/15 0450 09/25/15 0340  INR 1.33 1.45 1.42 1.21  1.48    Other results:  EKG:   Imaging: Dg Chest Port 1 View  09/24/2015  CLINICAL DATA:  Postop day 1 left ventricular assist device implantation. EXAM: PORTABLE CHEST 1 VIEW COMPARISON:  09/13/2015 and earlier. FINDINGS: Left ventricular assist device. Left subclavian biventricular pacing defibrillator. Endotracheal tube tip in satisfactory position approximately 4 cm above the carina. Nasogastric tube courses below the diaphragm into the stomach. Swan-Ganz catheter tip in the proximal right main pulmonary artery. Bilateral chest tubes in place with no pneumothorax. Cardiac silhouette markedly enlarged, unchanged. 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 Atelectasis in the lung bases, slightly worse than yesterday. Small bilateral pleural effusions. IMPRESSION: 1. Support apparatus satisfactory. 2. No pneumothorax. 3. Bibasilar atelectasis and small bilateral pleural effusions. 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 Electronically Signed   By: Hulan Saas M.D.   On: 09/24/2015 08:21   Dg Chest Port 1 View  09/20/2015  CLINICAL DATA:  LVAD EXAM: PORTABLE CHEST 1 VIEW COMPARISON:  09/20/2015.  CT 09/22/2015 FINDINGS: Endotracheal tube is in place with the tip 3.8 cm above the carina. LVAD is partially imaged. There is mild cardiomegaly. Left AICD is unchanged. Intra aortic balloon pop slightly retracted within the proximal descending thoracic aorta. Bilateral chest tube in place. No pneumothorax. Bibasilar atelectasis. Mild vascular congestion. IMPRESSION: Bilateral chest tubes. No pneumothorax. Intra aortic balloon pump slightly retracted but remains in the proximal descending thoracic aorta. Bibasilar atelectasis. LVAD partially imaged. Electronically Signed   By: Charlett Nose M.D.   On: 09/21/2015 15:53     Medications:     Scheduled Medications: . acetaminophen  1,000 mg Oral 4 times per day   Or  . acetaminophen (TYLENOL) oral liquid 160 mg/5 mL  1,000 mg Per  Tube 4 times per day  . antiseptic oral rinse  7 mL Mouth Rinse QID  . aspirin EC  325 mg Oral Daily   Or  . aspirin  324 mg Per Tube Daily   Or  . aspirin  300 mg Rectal Daily  . bisacodyl  10 mg Oral Daily   Or  . bisacodyl  10 mg Rectal Daily  . docusate sodium  200 mg Oral Daily  . furosemide  40 mg Intravenous Q8H  . insulin aspart  0-24 Units Subcutaneous 6 times per day  . metoCLOPramide (REGLAN) injection  10 mg Intravenous 4 times per day  . pantoprazole  40 mg Oral Daily  . piperacillin-tazobactam (ZOSYN)  IV  3.375 g Intravenous 3 times per day  . potassium chloride  10 mEq Intravenous Q1 Hr x 3  . sildenafil  20 mg Oral TID  . sodium chloride  10 mL Intravenous Q12H  . sodium chloride  3 mL Intravenous Q12H  . vancomycin  1,000 mg Intravenous Q24H  . warfarin  2 mg Oral q1800  . Warfarin - Physician Dosing Inpatient   Does not apply q1800    Infusions: . sodium chloride 20 mL/hr at 09/25/15 0700  . sodium chloride    . sodium chloride 20 mL/hr at 09/21/2015 1900  . amiodarone 30 mg/hr (09/25/15 0700)  . dexmedetomidine Stopped (09/25/15 0630)  . EPINEPHrine 4 mg in dextrose 5% 250 mL infusion (16 mcg/mL) 2 mcg/min (09/25/15 0700)  . lactated ringers 20 mL/hr at 09/25/15 0700  . lactated ringers Stopped (09/24/15 0700)  . milrinone 0.3 mcg/kg/min (09/25/15 0700)  . norepinephrine (LEVOPHED) Adult infusion 1 mcg/min (09/25/15 0700)    PRN Medications: sodium chloride, hydrALAZINE, midazolam, ondansetron (ZOFRAN) IV, oxyCODONE, sodium chloride, traMADol   Assessment/Plan   1. Cardiogenic/septicshock - IABP placed 12/23 - 08/28/2015 - Now s/p HM II LVAD placement 09/03/2015 - Remain on milrinone 0.3, Epi 2, Norepi 1 and Nitric 2.5. Unable to extubate yesterday, plan to this am per Dr. Norina Buzzard.  - Presented as INTERMACS-1 and placed on milrinone and IABP implanted.  2. 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 RV mild to moderate HK - s/p St. Jude CRT-D - As  above, s/p LVAD placement.  - Remains volume overloaded. Creatinine trending up on Lasix 40 mg IV q 8 hrs. CVP 8-9. May back down to 40 mg BID.  - Continue for this am.  Previous baseline Cr 1.5-1.7. 1.9 this am.  3. PAF on Eiiquis  - Back on coumadin and getting 325 ASA. Once INR therapeutic will drop ASA to 81. 4. H/o VT on amiodarone - Occasional PVCs.  - Supp K. Mg stable.  5. 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 (previous baseline ~1.5-1.7). 1.91 this am  - Follow closely with diuresis 6. HTN - ART line showing BPs 90/70s.   7. Hyperlipidemia 8. Pulmonary HTN - Back on sildenafil 20mg  TID 9. Urinary Retention s/p urethral dilation  - Urine pinkish red yesterday but clearing up now.   I reviewed the LVAD parameters from today, and compared the results to the patient's prior recorded data.  No programming changes were made.  The LVAD is functioning within specified parameters. LVAD interrogation was negative for any significant power changes, alarms or PI events/speed drops.  LVAD equipment check completed and is in good working order.  Back-up equipment present.   Length of Stay: [address]  Graciella Freer, [place] 09/25/2015, 7:06 AM  VAD Team --- VAD ISSUES ONLY--- Pager [phone] (7am - 7am)  Advanced Heart Failure Team  Pager [phone] (M-F; 7a - 4p)  Please contact [facility] for night-coverage after hours (4p -7a ) and weekends on [email or web address]  Patient seen with PA, agree with the above note.  He is stable today, extubated with PA catheter  out.  Diuresed well with IV Lasix, CVP around 10 now.  Still with pulmonary edema on CXR. Creatinine mildly up.   - Continue Lasix, use 40 mg IV bid today.  - Wean NO, can increase Revatio to 40 mg tid.  - Will leave on milrinone along with low doses of norepinephrine and epinephrine today, begin to wean in am if creatinine remains stable.  - Continue  coumadin, INR trending up.   35 minutes critical care time.   Marca Ancona 09/25/2015 1:39 PM

## 2015-09-25 NOTE — Progress Notes (Addendum)
Pharmacy Antibiotic Follow-up Note  ADDENDUM:  VR with morning labs = 37. SCr 2.20>2.64.   Plan: VR in 24 hours Resume therapy when vancomycin level <20 mcg/mL  Sherle Poe, PharmD Clinical Pharmacy Resident 09/26/2015 [phone] _________________________________________________________________ Brad [name] is a 74 y.o. year-old male admitted on 08/28/2015.  The patient is currently on day 8 of vancomycin and zosyn  For urosepsis initially, now empiric s/p LVAD 12/28. -SCr trending up (1.6>>1.97>>2.2), CrCl ~ 25-30 -Vancomycin level= 35 at 8:50pm (last dose given ~ 9:20pm on 12/29). A dose was started tonight but this was after the level was collected.  -The vancomycin level is not a true trough. Doses have been rescheduled (likey around surgery) and the actual trough can not be determined  Plan: -Stop the current infusion of vancomycin -Vancomycin random in am (will consider re-dosing when level is ~ 20) -Vancomycin noted for d/c on 09/29/14    Temp (24hrs), Avg:98.1 F (36.7 C), Min:96.8 F (36 C), Max:99.4 F (37.4 C)   Recent Labs Lab 09/17/2015 1600 09/21/2015 2100 09/24/15 0450 09/24/15 1615 09/25/15 0340  WBC 8.9 8.5 10.3 10.7* 12.3*     Recent Labs Lab 09/24/15 0450 09/24/15 1615 09/24/15 1625 09/25/15 0340 09/25/15 1741  CREATININE 1.40* 1.81* 1.60* 1.97* 2.20*   Estimated Creatinine Clearance: 27.5 mL/min (by C-G formula based on Cr of 2.2).    No Known Allergies  Antimicrobials this admission: 12/22 PO Septra x 1 dose, then decompensated so broaden to zosyn 12/22 Zosyn EI>> 12/23 Vanc > (09/29/14) 12/ 29 rifampin x 1 dose  Levels/dose changes this admission: 12/30: vancomycin level =  35 at 8:50pm  Microbiology results: 12/22 Bcx ng final 12/22 Ucx insignif growth final MRSA PCR neg  Thank you for allowing pharmacy to be a part of this patient's care.  Harland German, Pharm D 09/25/2015 10:01 PM

## 2015-09-25 NOTE — Progress Notes (Signed)
CSW notified from LVAD Coordinator that patient underwent LVAD implantation earlier today.  CSW services will will follow and assist patient/wife as needed during hospital recovery period.  Lorri Frederick. Jaci Lazier, [place] [phone]   .

## 2015-09-25 NOTE — Procedures (Signed)
Extubation Procedure Note  Patient Details:   Name: Brad [name] DOB: 08/30/1941 MRN: [id number]   Airway Documentation:     Evaluation  O2 sats: stable throughout Complications: No apparent complications Patient did tolerate procedure well. Bilateral Breath Sounds: Clear, Diminished Suctioning: Oral, Airway Yes  4l/min  With 3 ppm NO IS instructed NIF-20/FVC-1.9  Newt Lukes 09/25/2015, 7:48 AM

## 2015-09-25 NOTE — Progress Notes (Signed)
CSW met with patient today- he was extubated today and is sitting up in a chair. He is noted to be alert and oriented; able to converse with CSW.  Support provided and patient denies any current needs.  CSW spoke with patient's wife who left early afternoon to drive home to [place] (she states it's a 3 hour drive) and plans to return tomorrow. She stated she needed to take care of bills and some other things as she has been staying in a hospital since his surgery. Discussed importance of self care and provided support and encouragement; she denies any questions or concerns at this time.  CSW services will continue to monitor and assist as needed.  Lorie Phenix. Pauline Good, [place]

## 2015-09-25 NOTE — Clinical Documentation Improvement (Signed)
Cardiothoracic  Can the diagnosis of intraoperative anemia be further specified?   Acute Blood Loss Anemia  Nutritional anemia, including the nutrition or mineral deficits  Acute on Chronic Blood Loss Anemia  Anemia of chronic disease, including the associated chronic disease state  Other  Clinically Undetermined  Document any associated diagnoses/conditions.   Supporting Information: Intraoperative anemia requiring 2 units of packed red blood cells per 12/28 progress notes. H/H: 12/28:   7.3/21.2 12/21:  14.6/43.4   Please exercise your independent, professional judgment when responding. A specific answer is not anticipated or expected.   Thank Sabino Donovan Health Information Management [place] [phone]

## 2015-09-25 NOTE — Clinical Documentation Improvement (Signed)
Cardiology  Can the diagnosis of systemic infection be further specified? Please document, if applicable, if Sepsis was present on admission.   Sepsis - specify causative organism if known  Identify etiology of Sepsis - Device, Implant, Graft, Infusion, Abortion  Specify organ dysfunction - Respiratory Failure, Encephalopathy, Acute Kidney Failure, Pneumonia, UTI, Other (specify), Unable to Clinically Determine}  Other  Clinically Undetermined  Document any associated diagnoses/conditions.   Supporting Information: Early urosepsis from UTI, blood cultures drawn, antibiotic expanded per 12/22 progress notes. R/O sepsis note per 12/23 Pharmacy consult, Vanc 1 gm IV q 48 hours. With UTI, marked drop in CVP and temporary vasopressin dependence suspect he also had component of urosepsis now improved with abx per 12/24 progress notes.   Please exercise your independent, professional judgment when responding. A specific answer is not anticipated or expected.   Thank Sabino Donovan Health Information Management [place] [phone]

## 2015-09-26 ENCOUNTER — Inpatient Hospital Stay (HOSPITAL_COMMUNITY): Payer: Medicare Other

## 2015-09-26 LAB — COMPREHENSIVE METABOLIC PANEL
ALT: 23 U/L (ref 17–63)
AST: 46 U/L — ABNORMAL HIGH (ref 15–41)
Albumin: 2.6 g/dL — ABNORMAL LOW (ref 3.5–5.0)
Alkaline Phosphatase: 87 U/L (ref 38–126)
Anion gap: 13 (ref 5–15)
BUN: 20 mg/dL (ref 6–20)
CO2: 22 mmol/L (ref 22–32)
Calcium: 8.8 mg/dL — ABNORMAL LOW (ref 8.9–10.3)
Chloride: 111 mmol/L (ref 101–111)
Creatinine, Ser: 2.64 mg/dL — ABNORMAL HIGH (ref 0.61–1.24)
GFR calc Af Amer: 26 mL/min — ABNORMAL LOW (ref >60)
GFR calc non Af Amer: 22 mL/min — ABNORMAL LOW (ref >60)
Glucose, Bld: 127 mg/dL — ABNORMAL HIGH (ref 65–99)
Potassium: 4.1 mmol/L (ref 3.5–5.1)
Sodium: 146 mmol/L — ABNORMAL HIGH (ref 135–145)
Total Bilirubin: 1 mg/dL (ref 0.3–1.2)
Total Protein: 5.6 g/dL — ABNORMAL LOW (ref 6.5–8.1)

## 2015-09-26 LAB — CBC WITH DIFFERENTIAL/PLATELET
Basophils Absolute: 0 10*3/uL (ref 0.0–0.1)
Basophils Relative: 0 %
Eosinophils Absolute: 0.2 10*3/uL (ref 0.0–0.7)
Eosinophils Relative: 2 %
HCT: 29.8 % — ABNORMAL LOW (ref 39.0–52.0)
Hemoglobin: 10.6 g/dL — ABNORMAL LOW (ref 13.0–17.0)
Lymphocytes Relative: 5 %
Lymphs Abs: 0.7 10*3/uL (ref 0.7–4.0)
MCH: 29.4 pg (ref 26.0–34.0)
MCHC: 35.6 g/dL (ref 30.0–36.0)
MCV: 82.5 fL (ref 78.0–100.0)
Monocytes Absolute: 1.9 10*3/uL — ABNORMAL HIGH (ref 0.1–1.0)
Monocytes Relative: 12 %
Neutro Abs: 12.5 10*3/uL — ABNORMAL HIGH (ref 1.7–7.7)
Neutrophils Relative %: 81 %
Platelets: 255 10*3/uL (ref 150–400)
RBC: 3.61 MIL/uL — ABNORMAL LOW (ref 4.22–5.81)
RDW: 15.6 % — ABNORMAL HIGH (ref 11.5–15.5)
WBC: 15.5 10*3/uL — ABNORMAL HIGH (ref 4.0–10.5)

## 2015-09-26 LAB — GLUCOSE, CAPILLARY
GLUCOSE-CAPILLARY: 133 mg/dL — AB (ref 65–99)
Glucose-Capillary: 118 mg/dL — ABNORMAL HIGH (ref 65–99)
Glucose-Capillary: 187 mg/dL — ABNORMAL HIGH (ref 65–99)
Glucose-Capillary: 178 mg/dL — ABNORMAL HIGH (ref 65–99)
Glucose-Capillary: 148 mg/dL — ABNORMAL HIGH (ref 65–99)

## 2015-09-26 LAB — POCT I-STAT, CHEM 8
BUN: 28 mg/dL — AB (ref 6–20)
CHLORIDE: 110 mmol/L (ref 101–111)
Calcium, Ion: 1.22 mmol/L (ref 1.13–1.30)
Creatinine, Ser: 2.8 mg/dL — ABNORMAL HIGH (ref 0.61–1.24)
Glucose, Bld: 189 mg/dL — ABNORMAL HIGH (ref 65–99)
HCT: 36 % — ABNORMAL LOW (ref 39.0–52.0)
Hemoglobin: 12.2 g/dL — ABNORMAL LOW (ref 13.0–17.0)
POTASSIUM: 3.6 mmol/L (ref 3.5–5.1)
SODIUM: 142 mmol/L (ref 135–145)
TCO2: 23 mmol/L (ref 0–100)

## 2015-09-26 LAB — VANCOMYCIN, RANDOM: Vancomycin Rm: 37 ug/mL

## 2015-09-26 LAB — LACTATE DEHYDROGENASE: LDH: 308 U/L — ABNORMAL HIGH (ref 98–192)

## 2015-09-26 LAB — CARBOXYHEMOGLOBIN
Carboxyhemoglobin: 1.3 % (ref 0.5–1.5)
Methemoglobin: 1.1 % (ref 0.0–1.5)
O2 Saturation: 79.3 %
Total hemoglobin: 9.6 g/dL — ABNORMAL LOW (ref 13.5–18.0)

## 2015-09-26 LAB — MAGNESIUM: Magnesium: 2.3 mg/dL (ref 1.7–2.4)

## 2015-09-26 LAB — PHOSPHORUS: Phosphorus: 4.4 mg/dL (ref 2.5–4.6)

## 2015-09-26 LAB — PROTIME-INR
INR: 1.61 — ABNORMAL HIGH (ref 0.00–1.49)
Prothrombin Time: 19.2 seconds — ABNORMAL HIGH (ref 11.6–15.2)

## 2015-09-26 MED ORDER — INSULIN ASPART 100 UNIT/ML ~~LOC~~ SOLN: 0.0000 [IU] | Freq: Every day | SUBCUTANEOUS | Status: DC

## 2015-09-26 MED ORDER — POTASSIUM CHLORIDE 10 MEQ/50ML IV SOLN
10.0000 meq | INTRAVENOUS | Status: AC
  Administered 2015-09-26 (×2): 10 meq via INTRAVENOUS
  Filled 2015-09-26: qty 50

## 2015-09-26 MED ORDER — NOREPINEPHRINE BITARTRATE 1 MG/ML IV SOLN
2.0000 ug/min | INTRAVENOUS | Status: DC
  Administered 2015-09-26: 2 ug/min via INTRAVENOUS
  Filled 2015-09-26: qty 4

## 2015-09-26 MED ORDER — AMIODARONE HCL 200 MG PO TABS
200.0000 mg | ORAL_TABLET | Freq: Every day | ORAL | Status: DC
  Administered 2015-09-26 – 2015-10-09 (×14): 200 mg via ORAL
  Filled 2015-09-26 (×14): qty 1

## 2015-09-26 MED ORDER — INSULIN ASPART 100 UNIT/ML ~~LOC~~ SOLN
0.0000 [IU] | Freq: Three times a day (TID) | SUBCUTANEOUS | Status: DC
  Administered 2015-09-26 – 2015-09-28 (×6): 2 [IU] via SUBCUTANEOUS
  Administered 2015-09-29 – 2015-10-01 (×5): 1 [IU] via SUBCUTANEOUS
  Administered 2015-10-02: 2 [IU] via SUBCUTANEOUS
  Administered 2015-10-02 (×2): 1 [IU] via SUBCUTANEOUS
  Administered 2015-10-03: 2 [IU] via SUBCUTANEOUS
  Administered 2015-10-03: 3 [IU] via SUBCUTANEOUS
  Administered 2015-10-04 – 2015-10-05 (×5): 2 [IU] via SUBCUTANEOUS
  Administered 2015-10-06: 1 [IU] via SUBCUTANEOUS
  Administered 2015-10-08: 2 [IU] via SUBCUTANEOUS
  Administered 2015-10-08 (×2): 3 [IU] via SUBCUTANEOUS
  Administered 2015-10-09 (×2): 1 [IU] via SUBCUTANEOUS

## 2015-09-26 MED ORDER — ENSURE ENLIVE PO LIQD
237.0000 mL | Freq: Three times a day (TID) | ORAL | Status: DC
  Administered 2015-09-26 – 2015-10-04 (×15): 237 mL via ORAL

## 2015-09-26 MED ORDER — FUROSEMIDE 10 MG/ML IJ SOLN: 40.0000 mg | Freq: Every day | INTRAMUSCULAR | Status: DC

## 2015-09-26 NOTE — Evaluation (Signed)
Clinical/Bedside Swallow Evaluation Patient Details  Name: Diamon Rensch MRN: [id number] Date of Birth: Nov 30, 1940  Today's Date: 09/26/2015 Time: SLP Start Time (ACUTE ONLY): 1105 SLP Stop Time (ACUTE ONLY): 1114 SLP Time Calculation (min) (ACUTE ONLY): 9 min  Past Medical History:  Past Medical History  Diagnosis Date  . 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 CKD (chronic kidney disease) stage 3, GFR 30-59 ml/min   . V-tach (HCC)   . PAF (paroxysmal atrial fibrillation) (HCC)   . CVA (cerebral infarction)   . BPH (benign prostatic hyperplasia)   . HTN (hypertension), benign   . Hyperlipidemia    Past Surgical History:  Past Surgical History  Procedure Laterality Date  . Cardiac catheterization N/A October 04, 2015    Procedure: Right Heart Cath;  Surgeon: Dolores Patty, MD;  Location: [facility] INVASIVE CV LAB;  Service: Cardiovascular;  Laterality: N/A;  . Cardiac catheterization N/A Oct 04, 2015    Procedure: IABP Insertion;  Surgeon: Dolores Patty, MD;  Location: [facility] INVASIVE CV LAB;  Service: Cardiovascular;  Laterality: N/A;  . Insertion of implantable left ventricular assist device N/A 08/29/2015    Procedure: INSERTION OF IMPLANTABLE LEFT VENTRICULAR ASSIST DEVICE;  Surgeon: Kerin Perna, MD;  Location: [facility] OR;  Service: Open Heart Surgery;  Laterality: N/A;  CIRC ARREST  NITRIC OXIDE  . Tee without cardioversion N/A 09/06/2015    Procedure: TRANSESOPHAGEAL ECHOCARDIOGRAM (TEE);  Surgeon: Kerin Perna, MD;  Location: [facility] OR;  Service: Open Heart Surgery;  Laterality: N/A;   HPI:  74 y/o male with h/o PAF, previous CVA, HTN, HL, V. 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 Jude CTR-D (initial ICD 2007 with upgrade to CRT in 2015). Pt s/p LVAD placement 12/28.   Assessment / Plan / Recommendation Clinical Impression  Patient presents with a functional oropharyngeal swallow without evidence of dysphagia or  aspiration. Two day intubation combined with deconditioning does increase aspiration risk warranting use of general safe swallowing precautions including small bites and sips and slow rate of intake, allowing patient to self feed as able to maximize safety. Given increased risk and RN report of some difficulty with medications today, will f/u briefly for diet tolerance.     Aspiration Risk  Mild aspiration risk    Diet Recommendation Regular;Thin liquid   Liquid Administration via: Cup;Straw Medication Administration: Whole meds with liquid Supervision: Patient able to self feed;Intermittent supervision to cue for compensatory strategies Compensations: Slow rate;Small sips/bites Postural Changes: Seated upright at 90 degrees    Other  Recommendations Oral Care Recommendations: Oral care BID   Follow up Recommendations  None    Frequency and Duration min 1 x/week  1 week           Swallow Study   General HPI: 74 y/o male with h/o PAF, previous CVA, HTN, HL, V. 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 Jude CTR-D (initial ICD 2007 with upgrade to CRT in 2015). Pt s/p LVAD placement 12/28. Type of Study: Bedside Swallow Evaluation Previous Swallow Assessment: none noted Diet Prior to this Study: Regular;Thin liquids Temperature Spikes Noted: No Respiratory Status: Nasal cannula History of Recent Intubation: Yes Length of Intubations (days): 2 days Date extubated: 09/25/15 Behavior/Cognition: Alert;Cooperative;Pleasant mood Oral Cavity Assessment: Within Functional Limits Oral Care Completed by SLP: Recent completion by staff Oral Cavity - Dentition: Adequate natural dentition Vision: Functional  for self-feeding Self-Feeding Abilities: Able to feed self;Needs assist Patient Positioning: Upright in chair Baseline Vocal Quality: Normal Volitional Cough: Weak Volitional Swallow: Able to elicit    Oral/Motor/Sensory Function Overall Oral Motor/Sensory  Function: Within functional limits   Ice Chips Ice chips: Not tested   Thin Liquid Thin Liquid: Within functional limits Presentation: Cup;Self Fed;Straw    Nectar Thick Nectar Thick Liquid: Not tested   Honey Thick Honey Thick Liquid: Not tested   Puree Puree: Within functional limits Presentation: Spoon   Solid   GO   Moorea Boissonneault MA, CCC-SLP [phone]  Solid: Within functional limits       Iyonna Rish Meryl 09/26/2015,11:17 AM

## 2015-09-26 NOTE — Progress Notes (Signed)
Inpatient Rehabilitation  We received a prescreen request from OT following the OT eval yesterday.  PT session withheld this am due to pt. being weaned from NO.  Please consider IP Rehab consult if pt. continues to demonstrate anticipated need for post acute rehabilitation.  Thanks,  Weldon Picking PT Inpatient Rehab Admissions Coordinator Cell [phone] Office [phone]

## 2015-09-26 NOTE — Progress Notes (Signed)
Physical Therapy Treatment Patient Details Name: Brad [name] MRN: [id number] DOB: [birth year] Today's Date: 09/26/2015    History of Present Illness 74 y/o male with h/o PAF, previous CVA, HTN, HL, V. 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 Jude CTR-D (initial ICD 2007 with upgrade to CRT in 2015). Pt s/p LVAD placement 12/28.    PT Comments    Pt with very flat affect, trunk flexion maintained throughout and great difficulty moving all limbs or initiating transfers. Brad [name] was unable to state sternal precautions and educated for all. Did not attempt power transfer at this time as pt with limited mobility. Pt's Flow remained 8800 but PI again dropped from 4.6-2.6 this session with stand pivot transfers, after sitting rose to 3.6 and 4.5 end of session. Pt reports no weakness, dizziness or symptoms, RN aware. HR 87-126 with activity, sats 97-100% on 6L. Brad [name] encourged to continue moving limbs and assisting with mobility. Do not feel pt will be able to transition straight home at this time and CIR recommended.   Follow Up Recommendations  CIR;Supervision/Assistance - 24 hour     Equipment Recommendations  Rolling walker with 5" wheels;3in1 (PT)    Recommendations for Other Services Rehab consult     Precautions / Restrictions Precautions Precautions: Fall;Sternal Precaution Comments: LVAD, 2 chest tubes, PI dropping with activity    Mobility  Bed Mobility Overal bed mobility: Needs Assistance;+2 for physical assistance Bed Mobility: Sit to Sidelying         Sit to sidelying: Max assist;+2 for physical assistance General bed mobility comments: max +2 assist to transition from sitting to right sidelying with assist to control trunk and elevate legs. Total assist to position hips in bed and +2 to slide up toward [name] with use of pad  Transfers Overall transfer level: Needs assistance   Transfers: Sit to/from Stand;Stand Pivot Transfers Sit to  Stand: +2 safety/equipment;Min assist;Mod assist Stand pivot transfers: +2 physical assistance;+2 safety/equipment;Mod assist       General transfer comment: Pt required max assist for reciprocal scooting to edge of chair and mod assist to stand from recliner, min assist with max cues for hand placement on thighs and anterior translation to stand from [facility] x 2 trials. Mod assist to pivot bed to [name] with assist to control pelvis and bil HHA  Ambulation/Gait Ambulation/Gait assistance: Mod assist;+2 safety/equipment;+2 physical assistance Ambulation Distance (Feet): 4 Feet Assistive device: Rolling walker (2 wheeled) Gait Pattern/deviations: Step-to pattern;Shuffle;Trunk flexed;Narrow base of support   Gait velocity interpretation: Below normal speed for age/gender General Gait Details: pt maintains head forward and flexed throughout sitting and standing with max verbal and tactile cues to correct. Pt with assist to direct and move RW as well as sequential stepping cues to move feet with assist to control trunk and balance.    Stairs            Wheelchair Mobility    Modified Rankin (Stroke Patients Only)       Balance Overall balance assessment: Needs assistance   Sitting balance-Leahy Scale: Poor       Standing balance-Leahy Scale: Poor                      Cognition Arousal/Alertness: Awake/alert Behavior During Therapy: Flat affect Overall Cognitive Status: Impaired/Different from baseline     Current Attention Level: Sustained   Following Commands: Follows one step commands with increased time     Problem  Solving: Slow processing;Decreased initiation      Exercises General Exercises - Lower Extremity Long Arc Quad: AAROM;Both;10 reps;Seated Heel Slides: AAROM;Both;10 reps;Supine Hip ABduction/ADduction: AAROM;Both;10 reps;Supine    General Comments        Pertinent Vitals/Pain Pain Assessment: No/denies pain    Home Living                       Prior Function            PT Goals (current goals can now be found in the care plan section) Progress towards PT goals: Goals downgraded-see care plan    Frequency       PT Plan Discharge plan needs to be updated    Co-evaluation             End of Session Equipment Utilized During Treatment: Oxygen Activity Tolerance: Patient tolerated treatment well Patient left: in bed;with call bell/phone within reach;with nursing/sitter in room;with family/visitor present     Time: 7544-9201 PT Time Calculation (min) (ACUTE ONLY): 37 min  Charges:  $Therapeutic Exercise: 8-22 mins $Therapeutic Activity: 8-22 mins                    G Codes:      Delorse Lek 2015/10/20, 2:08 PM Delaney Meigs, PT [phone]

## 2015-09-26 NOTE — Progress Notes (Signed)
Patient ID: Brad [name], male   DOB: Aug 28, 1941, 74 y.o.   MRN: [id number]    Advanced Heart Failure Rounding Note HeartMate 2 Rounding Note  Subjective:    Admitted from [facility] with cardiogenic shock for advanced heart failure as INTERMACS-1.  Milrinone started 12/21. On 12/22 foley placed by Urology requiring urethral dilation -> UTI . ? Septic shock. On vanco/zosyn. IABP placed 12/23.   S/p HM 2 LVAD placement 09/05/2015. Balloon pump removed 08/31/2015 without hematoma.  CVP about 10, co-ox 79% this morning.  Creatinine up to 2.64.    Remains on milrinone 0.3, Epi 2, Amio 30.  Weaned off NO and norepinephrine.   LVAD INTERROGATION:  HeartMate II LVAD:  Flow 4.8 liters/min, speed 8800, power 5.0, PI 4.7.  1 PI event/24 hrs  Objective:    Vital Signs:   Temp:  [98 F (36.7 C)-99.8 F (37.7 C)] 99 F (37.2 C) (12/31 0845) Pulse Rate:  [89-95] 95 (12/31 0731) Resp:  [22-34] 30 (12/31 0731) BP: (88-132)/(68-80) 132/80 mmHg (12/31 0845) SpO2:  [92 %-100 %] 99 % (12/31 0731) Arterial Line BP: (74-121)/(65-80) 121/76 mmHg (12/31 0700) Weight:  [150 lb 5.7 oz (68.2 kg)] 150 lb 5.7 oz (68.2 kg) (12/31 0600) Last BM Date: 09/22/15 Mean arterial Pressure 70-93   Intake/Output:   Intake/Output Summary (Last 24 hours) at 09/26/15 0916 Last data filed at 09/26/15 0800  Gross per 24 hour  Intake 2173.47 ml  Output   2880 ml  Net -706.53 ml     Physical Exam: General: NAD HEENT: normal Neck: supple.  Carotids 2+ bilat; no bruits. No thryomegaly or nodule noted. Cor: PMI nondisplaced. RRR. LVAD hum present. Lungs: Clear, mechanical breath sounds. Abdomen: soft, NT, ND, no HSM. No bruits or masses. +BS  Extremities: no cyanosis, clubbing, rash. RUE PICC, LUE radial arterial line. 1-2+ edema into thighs. SCDs present Neuro: alert & orientedx3, cranial nerves grossly intact. moves all 4 extremities w/o difficulty. Affect pleasant  Telemetry: AV paced 90s   Labs: Basic  Metabolic Panel:  Recent Labs Lab 09/17/2015 0534  09/26/2015 1600 09/03/2015 2100  09/24/15 0450 09/24/15 1615 09/24/15 1625 09/25/15 0340 09/25/15 1741 09/26/15 0448  NA 144  < > 148*  --   < > 147*  --  147* 146* 145 146*  K 3.5  < > 2.9*  --   < > 4.0  --  3.8 3.7 3.5 4.1  CL 119*  < > 118*  --   < > 117*  --  115* 114* 113* 111  CO2 19*  --  22  --   --  20*  --   --  23  --  22  GLUCOSE 140*  < > 114*  --   < > 131*  --  147* 125* 113* 127*  BUN 17  < > 13  --   < > 13  --  16 16 19 20   CREATININE 1.67*  < > 1.42* 1.44*  < > 1.40* 1.81* 1.60* 1.97* 2.20* 2.64*  CALCIUM 7.9*  --  7.5*  --   --  8.3*  --   --  9.0  --  8.8*  MG  --   --  1.8 2.7*  --  2.4 2.4  --  2.5*  --  2.3  PHOS  --   --   --   --   --  3.0  --   --  4.2  --  4.4  < > =  values in this interval not displayed.  Liver Function Tests:  Recent Labs Lab 09/25/2015 0534 09/24/15 0450 09/25/15 0340 09/26/15 0448  AST 51* 84* 64* 46*  ALT 34 ALKPHOS 97 66 60 87  BILITOT 0.5 4.3* 1.7* 1.0  PROT 4.7* 5.5* 5.5* 5.6*  ALBUMIN 1.6* 3.2* 3.0* 2.6*   No results for input(s): LIPASE, AMYLASE in the last 168 hours. No results for input(s): AMMONIA in the last 168 hours.  CBC:  Recent Labs Lab 09/15/2015 2100  09/24/15 0450 09/24/15 1615 09/24/15 1625 09/25/15 0340 09/25/15 1741 09/26/15 0448  WBC 8.5  --  10.3 10.7*  --  12.3*  --  15.5*  NEUTROABS  --   --  8.0*  --   --  9.9*  --  12.5*  HGB 7.3*  < > 11.6* 10.4* 10.5* 10.1* 11.9* 10.6*  HCT 21.2*  < > 33.7* 29.5* 31.0* 28.6* 35.0* 29.8*  MCV 84.1  --  83.8 82.4  --  82.7  --  82.5  PLT 148*  --  146* 147*  --  158  --  255  < > = values in this interval not displayed.  INR:  Recent Labs Lab 09/17/2015 1530 09/21/2015 1600 09/24/15 0450 09/25/15 0340 09/26/15 0448  INR 1.45 1.42 1.21 1.48 1.61*    Other results:  EKG:   Imaging: Dg Chest Port 1 View  09/26/2015  CLINICAL DATA:  Short of breath EXAM: PORTABLE CHEST 1 VIEW  COMPARISON:  Yesterday FINDINGS: Endotracheal and NG tubes removed. Lower lung volumes. Right chest tube removed without pneumothorax. Left chest tubes LVAD device are stable. AICD device is stable. Swan-Ganz catheter removed with the right jugular introducer left in place. Cardiomegaly persists. Pulmonary edema and bilateral effusions are not significantly changed. IMPRESSION: Extubated with lower lung volumes. Stable pulmonary edema and bilateral effusions. Right chest tube removed without right pneumothorax. Electronically Signed   By: Jolaine Click M.D.   On: 09/26/2015 08:03   Dg Chest Port 1 View  09/25/2015  CLINICAL DATA:  Left ventricular assist device. EXAM: PORTABLE CHEST 1 VIEW COMPARISON:  09/24/2015 FINDINGS: Endotracheal tube is 2.1 cm above the carina. Patient is markedly rotated towards the left on this examination which limits evaluation of the lungs. Left cardiac ICD. Nasogastric tube extends into the abdomen. There continues to be hazy densities in the lower chest on both sides. Evidence for a mediastinal and bilateral chest tubes. No large pneumothorax. Central line tip is near the superior cavoatrial junction. IMPRESSION: Limited examination due to patient positioning. Support apparatuses appear to be appropriately positioned. No evidence for a pneumothorax. Persistent densities in the lower lungs bilaterally. Findings may be related to volume loss and/or airspace disease. Cannot exclude pleural fluid despite the chest tubes. Electronically Signed   By: Richarda Overlie M.D.   On: 09/25/2015 07:59     Medications:     Scheduled Medications: . acetaminophen  1,000 mg Oral 4 times per day   Or  . acetaminophen (TYLENOL) oral liquid 160 mg/5 mL  1,000 mg Per Tube 4 times per day  . amiodarone  200 mg Oral Daily  . aspirin EC  325 mg Oral Daily  . bisacodyl  10 mg Oral Daily   Or  . bisacodyl  10 mg Rectal Daily  . docusate sodium  200 mg Oral Daily  . ENSURE  237 mL Oral TID BM  .  feeding supplement (ENSURE ENLIVE)  237 mL Oral BID BM  . [  START ON 09/27/2015] furosemide  40 mg Intravenous Daily  . insulin aspart  0-5 Units Subcutaneous QHS  . insulin aspart  0-9 Units Subcutaneous TID WC  . metoCLOPramide (REGLAN) injection  5 mg Intravenous 4 times per day  . pantoprazole  40 mg Oral Daily  . piperacillin-tazobactam (ZOSYN)  IV  3.375 g Intravenous 3 times per day  . sildenafil  40 mg Oral TID  . sodium chloride  10 mL Intravenous Q12H  . warfarin  4 mg Oral q1800  . Warfarin - Physician Dosing Inpatient   Does not apply q1800    Infusions: . sodium chloride Stopped (09/25/15 1100)  . sodium chloride 250 mL (09/26/15 0600)  . sodium chloride 20 mL/hr at 09/26/15 0600  . EPINEPHrine 4 mg in dextrose 5% 250 mL infusion (16 mcg/mL) 2 mcg/min (09/26/15 0600)  . lactated ringers Stopped (09/26/15 0000)  . lactated ringers Stopped (09/24/15 0700)  . milrinone 0.3 mcg/kg/min (09/26/15 0649)    PRN Medications: sodium chloride, hydrALAZINE, ondansetron (ZOFRAN) IV, oxyCODONE, sodium chloride   Assessment/Plan   1. Cardiogenic shock: NICM with EF 5%, mild to moderate RV hypokinesis.  S/p St Jude CRT-D.  IABP placed 12/23 - 09/22/2015.  Now s/p HM II LVAD placement 09/21/2015. Co-ox 79%. CVP about 10 with prominent peripheral edema. - Weaned off norepinephrine, remains on epi @ 2 and milrinone 0.3.  MAP stable in 70s-80s primarily.  Will continue current support today with rise in creatinine.  - Lasix 40 mg IV once daily today with rise in creatinine, keep even to mildly negative.  When creatinine stabilized, can be more aggressive with diuresis.  2. Atrial fibrillation: Paroxysmal.  Now A-V sequentially paced.  On coumadin and amiodarone.   3. 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  Rhythm stable.  4. AKI on CKD: Likely component of cardiorenal syndrome. Creatinine 1.9 prior to admission.  Up to 2.6 today, seeking to run I/Os even to mildly negative until creatinine stabilizes. 5.  Pulmonary HTN:  Off NO, on sildenafil 40 tid. 6. Anticoagulation: INR trending up on coumadin.  Continue ASA 325 until INR>2 then decrease to 81 daily.  7. ID: Remains on vancomycin/Zosyn.   35 minutes critical care time  I reviewed the LVAD parameters from today, and compared the results to the patient's prior recorded data.  No programming changes were made.  The LVAD is functioning within specified parameters. LVAD interrogation was negative for any significant power changes, alarms or PI events/speed drops.  LVAD equipment check completed and is in good working order.  Back-up equipment present.   Length of Stay: 10  Marca Ancona 09/26/2015, 9:16 AM  VAD Team --- VAD ISSUES ONLY--- Pager [phone] (7am - 7am)

## 2015-09-26 NOTE — Plan of Care (Signed)
Problem: Cardiac: Goal: Ability to maintain an adequate cardiac output will improve Outcome: Progressing Swan d/ced. Pt without s&s of decreased cardiac output

## 2015-09-26 NOTE — Progress Notes (Signed)
PT Cancellation Note  Patient Details Name: Brad [name] MRN: [id number] DOB: 1941/02/22   Cancelled Treatment:    Reason Eval/Treat Not Completed: Medical issues which prohibited therapy (Still on Nitrous oxide.  Nursing wants to wean.  Will check as able.)Thanks.   [name] F 09/26/2015, 8:34 AM  [name] [phone] (pager)

## 2015-09-26 NOTE — Progress Notes (Signed)
CT surgery p.m. Rounds  Patient had a good day, advance diet and had bowel movement Walked a few feet with physical therapy P.m. labs show creatinine 2.8, hemoglobin 12 Patient breathing comfortably on nasal cannula

## 2015-09-26 NOTE — Progress Notes (Signed)
Nitric turned off per Dr. Donata Clay from 2ppm via [place].  Vital signs stable, Sats 98% on 6L [place], HR 98, RR 24.  No distress noted.  RT will continue to monitor.

## 2015-09-27 ENCOUNTER — Inpatient Hospital Stay (HOSPITAL_COMMUNITY): Payer: Medicare Other

## 2015-09-27 DIAGNOSIS — N179 Acute kidney failure, unspecified: Secondary | ICD-10-CM | POA: Diagnosis present

## 2015-09-27 DIAGNOSIS — Z95811 Presence of heart assist device: Secondary | ICD-10-CM | POA: Diagnosis present

## 2015-09-27 LAB — COMPREHENSIVE METABOLIC PANEL
ALT: 21 U/L (ref 17–63)
AST: 36 U/L (ref 15–41)
Albumin: 2.2 g/dL — ABNORMAL LOW (ref 3.5–5.0)
Alkaline Phosphatase: 103 U/L (ref 38–126)
Anion gap: 12 (ref 5–15)
BUN: 31 mg/dL — ABNORMAL HIGH (ref 6–20)
CO2: 23 mmol/L (ref 22–32)
Calcium: 8.8 mg/dL — ABNORMAL LOW (ref 8.9–10.3)
Chloride: 109 mmol/L (ref 101–111)
Creatinine, Ser: 3.2 mg/dL — ABNORMAL HIGH (ref 0.61–1.24)
GFR calc Af Amer: 20 mL/min — ABNORMAL LOW (ref >60)
GFR calc non Af Amer: 18 mL/min — ABNORMAL LOW (ref >60)
Glucose, Bld: 129 mg/dL — ABNORMAL HIGH (ref 65–99)
Potassium: 3.6 mmol/L (ref 3.5–5.1)
Sodium: 144 mmol/L (ref 135–145)
Total Bilirubin: 1.1 mg/dL (ref 0.3–1.2)
Total Protein: 5.3 g/dL — ABNORMAL LOW (ref 6.5–8.1)

## 2015-09-27 LAB — VANCOMYCIN, RANDOM: Vancomycin Rm: 26 ug/mL

## 2015-09-27 LAB — CBC WITH DIFFERENTIAL/PLATELET
Basophils Absolute: 0.1 10*3/uL (ref 0.0–0.1)
Basophils Relative: 0 %
Eosinophils Absolute: 0.6 10*3/uL (ref 0.0–0.7)
Eosinophils Relative: 3 %
HCT: 30.1 % — ABNORMAL LOW (ref 39.0–52.0)
Hemoglobin: 11 g/dL — ABNORMAL LOW (ref 13.0–17.0)
Lymphocytes Relative: 6 %
Lymphs Abs: 1 10*3/uL (ref 0.7–4.0)
MCH: 30.4 pg (ref 26.0–34.0)
MCHC: 36.5 g/dL — ABNORMAL HIGH (ref 30.0–36.0)
MCV: 83.1 fL (ref 78.0–100.0)
Monocytes Absolute: 1.7 10*3/uL — ABNORMAL HIGH (ref 0.1–1.0)
Monocytes Relative: 11 %
Neutro Abs: 12.9 10*3/uL — ABNORMAL HIGH (ref 1.7–7.7)
Neutrophils Relative %: 80 %
Platelets: 291 10*3/uL (ref 150–400)
RBC: 3.62 MIL/uL — ABNORMAL LOW (ref 4.22–5.81)
RDW: 15.9 % — ABNORMAL HIGH (ref 11.5–15.5)
WBC: 16.2 10*3/uL — ABNORMAL HIGH (ref 4.0–10.5)

## 2015-09-27 LAB — CARBOXYHEMOGLOBIN
Carboxyhemoglobin: 1.7 % — ABNORMAL HIGH (ref 0.5–1.5)
Methemoglobin: 1 % (ref 0.0–1.5)
O2 SAT: 75.5 %
Total hemoglobin: 11.1 g/dL — ABNORMAL LOW (ref 13.5–18.0)

## 2015-09-27 LAB — GLUCOSE, CAPILLARY
GLUCOSE-CAPILLARY: 158 mg/dL — AB (ref 65–99)
GLUCOSE-CAPILLARY: 116 mg/dL — AB (ref 65–99)
Glucose-Capillary: 124 mg/dL — ABNORMAL HIGH (ref 65–99)
Glucose-Capillary: 90 mg/dL (ref 65–99)

## 2015-09-27 LAB — PROTIME-INR
INR: 2.7 — ABNORMAL HIGH (ref 0.00–1.49)
Prothrombin Time: 28.3 seconds — ABNORMAL HIGH (ref 11.6–15.2)

## 2015-09-27 LAB — MAGNESIUM: Magnesium: 2.5 mg/dL — ABNORMAL HIGH (ref 1.7–2.4)

## 2015-09-27 LAB — LACTATE DEHYDROGENASE: LDH: 308 U/L — ABNORMAL HIGH (ref 98–192)

## 2015-09-27 MED ORDER — ALBUMIN HUMAN 25 % IV SOLN
12.5000 g | Freq: Four times a day (QID) | INTRAVENOUS | Status: AC
  Administered 2015-09-27 (×3): 12.5 g via INTRAVENOUS
  Filled 2015-09-27 (×3): qty 50

## 2015-09-27 MED ORDER — ASPIRIN 81 MG PO CHEW
81.0000 mg | CHEWABLE_TABLET | Freq: Every day | ORAL | Status: DC
  Administered 2015-09-27 – 2015-10-15 (×19): 81 mg via ORAL
  Filled 2015-09-27 (×20): qty 1

## 2015-09-27 MED ORDER — PIPERACILLIN-TAZOBACTAM IN DEX 2-0.25 GM/50ML IV SOLN
2.2500 g | Freq: Three times a day (TID) | INTRAVENOUS | Status: DC
  Administered 2015-09-27 – 2015-09-29 (×7): 2.25 g via INTRAVENOUS
  Filled 2015-09-27 (×9): qty 50

## 2015-09-27 MED ORDER — WARFARIN SODIUM 1 MG PO TABS
1.0000 mg | ORAL_TABLET | Freq: Every day | ORAL | Status: DC
  Administered 2015-09-27: 1 mg via ORAL
  Filled 2015-09-27 (×2): qty 1

## 2015-09-27 NOTE — Progress Notes (Signed)
Pharmacy Antibiotic Follow-up Note  Elizardo Hsiung is a 75 y.o. year-old male admitted on 08/31/2015.  The patient is currently on day 10 of vancomycin for LVAD post-op.  Planned stop date of 09/30/15, vanc random this AM was 26. SCr increased to 3.2. Goal vancomycin trough for prophylaxis is 10-15.  Patient's CrCl < 20, adjusted Zosyn dose to 2.25g IV q8h.  Assessment/Plan: - Due to patients kinetics and increasing SCr will not re-dose patient at this time. - Monitor SCr and vanc random tomorrow AM   Temp (24hrs), Avg:98.4 F (36.9 C), Min:98.1 F (36.7 C), Max:98.7 F (37.1 C)   Recent Labs Lab 09/24/15 0450 09/24/15 1615 09/25/15 0340 09/26/15 0448 09/27/15 0345  WBC 10.3 10.7* 12.3* 15.5* 16.2*    Recent Labs Lab 09/25/15 0340 09/25/15 1741 09/26/15 0448 09/26/15 1634 09/27/15 0345  CREATININE 1.97* 2.20* 2.64* 2.80* 3.20*   Estimated Creatinine Clearance: 18.6 mL/min (by C-G formula based on Cr of 3.2).    No Known Allergies  Antimicrobials this admission: 12/22 PO Septra x 1 dose, then decompensated so broaden to zosyn 12/22 Zosyn EI>> 12/23 Vanc > (09/30/15) 12/ 29 rifampin x 1 dose  Levels/dose changes this admission: (Last dose 12/29) 12/30 VT 35 12/31 VR 37 1/1 VR 26  Microbiology results: 12/22 Bcx ng final 12/22 Ucx insignif growth final MRSA PCR neg  Thank you for allowing pharmacy to be a part of this patient's care.  Casilda Carls, PharmD. PGY-1 Pharmacy Resident Pager: [phone] 09/27/2015 1:40 PM

## 2015-09-27 NOTE — Progress Notes (Addendum)
CT surgery HeartMate 2 Rounding Note POD#4 Heartmate 2 implantation   Subjective:   Class 4 CHF with cardiogenic shock, hx nonischemic    Cardiomyopathy Preop IABP Hx BPH, TURP and bladder outlet obstruction required cystoscopy to place foley\ Preop severe protein loss malnutrition, prealbumin < 8 Pre and Post-op acute on chronic renal failure Preop guaiac + stool Preop chronic Eliquis for a-fib  Patient had stable night Pump parameters at goal on low dose epi, norepi off Mil at 0.3   - co-ox 70 % A-V pacing at 90 Adequate diuresis with rising creatinine now 3.2 Vent weaned an patient extubated, inhaled nitric oxide off Patient has been out of bed to chair and walked a few steps in room   LVAD INTERROGATION:  HeartMate II LVAD:  Flow 5.4 liters/min, speed 8800, power 5.2, PI 6.4  Controller intact  Objective:    Vital Signs:   Temp:  [98.1 F (36.7 C)-98.7 F (37.1 C)] 98.3 F (36.8 C) (01/01 0815) Pulse Rate:  [87-106] 106 (01/01 0700) Resp:  [18-31] 31 (01/01 0700) BP: (92-118)/(75-97) 115/89 mmHg (01/01 0700) SpO2:  [96 %-100 %] 98 % (01/01 0700) Weight:  [143 lb 1.3 oz (64.9 kg)] 143 lb 1.3 oz (64.9 kg) (01/01 0500) Last BM Date: 09/27/15 Mean arterial Pressure 70-75  Intake/Output:   Intake/Output Summary (Last 24 hours) at 09/27/15 1015 Last data filed at 09/27/15 0700  Gross per 24 hour  Intake 839.38 ml  Output   1045 ml  Net -205.62 ml     Physical Exam: General:  Well appearing. No resp difficulty HEENT: normal Neck: supple. no JVP  No carotid pulses; no bruits. No lymphadenopathy or thryomegaly appreciated. Cor: Mechanical heart sounds with LVAD hum present. Lungs: clear Abdomen: soft, nontender, nondistended. No hepatosplenomegaly. No bruits or masses. Good bowel sounds. Extremities: no cyanosis, clubbing, rash, mild- mod extremity  edema Neuro: alert & orientedx3, cranial nerves grossly intact. t  Telemetry: paced rhythm 90  Labs: Basic  Metabolic Panel:  Recent Labs Lab 08/31/2015 1600  09/24/15 0450 09/24/15 1615  09/25/15 0340 09/25/15 1741 09/26/15 0448 09/26/15 1634 09/27/15 0345  NA 148*  < > 147*  --   < > 146* 145 146* 142 144  K 2.9*  < > 4.0  --   < > 3.7 3.5 4.1 3.6 3.6  CL 118*  < > 117*  --   < > 114* 113* 111 110 109  CO2 22  --  20*  --   --  23  --  22  --  23  GLUCOSE 114*  < > 131*  --   < > 125* 113* 127* 189* 129*  BUN 13  < > 13  --   < > 16 19 20  28* 31*  CREATININE 1.42*  < > 1.40* 1.81*  < > 1.97* 2.20* 2.64* 2.80* 3.20*  CALCIUM 7.5*  --  8.3*  --   --  9.0  --  8.8*  --  8.8*  MG 1.8  < > 2.4 2.4  --  2.5*  --  2.3  --  2.5*  PHOS  --   --  3.0  --   --  4.2  --  4.4  --   --   < > = values in this interval not displayed.  Liver Function Tests:  Recent Labs Lab 09/02/2015 0534 09/24/15 0450 09/25/15 0340 09/26/15 0448 09/27/15 0345  AST 51* 84* 64* 46* 36  ALT 34 28 23  23 21  ALKPHOS 97 66 60 87 103  BILITOT 0.5 4.3* 1.7* 1.0 1.1  PROT 4.7* 5.5* 5.5* 5.6* 5.3*  ALBUMIN 1.6* 3.2* 3.0* 2.6* 2.2*   No results for input(s): LIPASE, AMYLASE in the last 168 hours. No results for input(s): AMMONIA in the last 168 hours.  CBC:  Recent Labs Lab 09/24/15 0450 09/24/15 1615  09/25/15 0340 09/25/15 1741 09/26/15 0448 09/26/15 1634 09/27/15 0345  WBC 10.3 10.7*  --  12.3*  --  15.5*  --  16.2*  NEUTROABS 8.0*  --   --  9.9*  --  12.5*  --  12.9*  HGB 11.6* 10.4*  < > 10.1* 11.9* 10.6* 12.2* 11.0*  HCT 33.7* 29.5*  < > 28.6* 35.0* 29.8* 36.0* 30.1*  MCV 83.8 82.4  --  82.7  --  82.5  --  83.1  PLT 146* 147*  --  158  --  255  --  291  < > = values in this interval not displayed.  INR:  Recent Labs Lab 09/10/2015 1600 09/24/15 0450 09/25/15 0340 09/26/15 0448 09/27/15 0345  INR 1.42 1.21 1.48 1.61* 2.70*    Other results:  EKG:   Imaging: Dg Chest Port 1 View  09/27/2015  CLINICAL DATA:  LVAD ( left ventricular assist device) present . History of ventricular  tachycardia. Chronic systolic heart failure. EXAM: PORTABLE CHEST 1 VIEW COMPARISON:  One day prior FINDINGS: Right internal jugular line tip at mid SVC. Removal of mediastinal drain. Pacer/AICD device. Left ventricular assist device, unchanged in position. Midline trachea. Moderate cardiomegaly. left chest tubes remain in place. No pneumothorax. Mild interstitial edema. Slight improvement in left worse than right base airspace disease. IMPRESSION: Minimally improved aeration with persistent left greater than right base airspace disease and left pleural effusion. Cardiomegaly with mild interstitial edema. Electronically Signed   By: Jeronimo Greaves M.D.   On: 09/27/2015 09:26   Dg Chest Port 1 View  09/26/2015  CLINICAL DATA:  Short of breath EXAM: PORTABLE CHEST 1 VIEW COMPARISON:  Yesterday FINDINGS: Endotracheal and NG tubes removed. Lower lung volumes. Right chest tube removed without pneumothorax. Left chest tubes LVAD device are stable. AICD device is stable. Swan-Ganz catheter removed with the right jugular introducer left in place. Cardiomegaly persists. Pulmonary edema and bilateral effusions are not significantly changed. IMPRESSION: Extubated with lower lung volumes. Stable pulmonary edema and bilateral effusions. Right chest tube removed without right pneumothorax. Electronically Signed   By: Jolaine Click M.D.   On: 09/26/2015 08:03     Medications:     Scheduled Medications: . acetaminophen  1,000 mg Oral 4 times per day   Or  . acetaminophen (TYLENOL) oral liquid 160 mg/5 mL  1,000 mg Per Tube 4 times per day  . albumin human  12.5 g Intravenous Q6H  . amiodarone  200 mg Oral Daily  . aspirin  81 mg Oral Daily  . bisacodyl  10 mg Oral Daily   Or  . bisacodyl  10 mg Rectal Daily  . docusate sodium  200 mg Oral Daily  . feeding supplement (ENSURE ENLIVE)  237 mL Oral TID BM  . insulin aspart  0-5 Units Subcutaneous QHS  . insulin aspart  0-9 Units Subcutaneous TID WC  .  metoCLOPramide (REGLAN) injection  5 mg Intravenous 4 times per day  . pantoprazole  40 mg Oral Daily  . piperacillin-tazobactam (ZOSYN)  IV  2.25 g Intravenous 3 times per day  . sildenafil  40  mg Oral TID  . sodium chloride  10 mL Intravenous Q12H  . warfarin  1 mg Oral q1800  . Warfarin - Physician Dosing Inpatient   Does not apply q1800    Infusions: . sodium chloride Stopped (09/25/15 1100)  . sodium chloride 250 mL (09/27/15 0700)  . sodium chloride Stopped (09/26/15 1600)  . EPINEPHrine 4 mg in dextrose 5% 250 mL infusion (16 mcg/mL) 2 mcg/min (09/27/15 0700)  . lactated ringers Stopped (09/26/15 0000)  . lactated ringers Stopped (09/24/15 0700)  . milrinone 0.3 mcg/kg/min (09/27/15 0700)  . norepinephrine (LEVOPHED) Adult infusion Stopped (09/27/15 0600)    PRN Medications: sodium chloride, hydrALAZINE, ondansetron (ZOFRAN) IV, oxyCODONE, sodium chloride   Assessment:  Stable with mild-mod RV dysfunction Postoperative acute on chronic renal failure, creatinine 3.2 Postoperative expected acute on chronic blood loss anemia Cont mil and low dose epi  until renal fx stablilized Continue low dose coumadin-  INR 2.7 Hold Lasix  because rising BUN creatinine Remove left pleural tube, retain VAD pocket drain  Plan/Discussion:      I reviewed the LVAD parameters from today, and compared the results to the patient's prior recorded data.  No programming changes were made.  The LVAD is functioning within specified parameters.  The patient performs LVAD self-test daily.  LVAD interrogation was negative for any significant power changes, alarms or PI events/speed drops.  LVAD equipment check completed and is in good working order.  Back-up equipment present.   LVAD education done on emergency procedures and precautions and reviewed exit site care.  Length of Stay: [address]  Kathlee [place] 09/27/2015, 10:15 AM

## 2015-09-27 NOTE — Progress Notes (Signed)
Patient ID: Brad [name], male   DOB: 1940-12-26, 75 y.o.   MRN: [id number]    Advanced Heart Failure Rounding Note HeartMate 2 Rounding Note  Subjective:    Admitted from [facility] with cardiogenic shock for advanced heart failure as INTERMACS-1.  Milrinone started 12/21. On 12/22 foley placed by Urology requiring urethral dilation -> UTI . ? Septic shock. On vanco/zosyn. IABP placed 12/23.   S/p HM 2 LVAD placement 09/01/2015. Balloon pump removed 08/30/2015 without hematoma.  CVP 6-8 cm, co-ox 76% this morning.  Creatinine 2.64 => 2.8 => 3.2.    Remains on milrinone 0.3, Epi 2, Amio 30.  Yesterday, drop in PI and MAP with simultaneous hydralazine dose + IV Lasix.  PI back up today and MAP > 90.   Feels ok, walked short distance yesterday.   LVAD INTERROGATION:  HeartMate II LVAD:  Flow 5.6 liters/min, speed 8800, power 5.2, PI 6.5.  4 PI event/24 hrs  Objective:    Vital Signs:   Temp:  [98.1 F (36.7 C)-98.7 F (37.1 C)] 98.3 F (36.8 C) (01/01 0815) Pulse Rate:  [65-106] 106 (01/01 0700) Resp:  [18-31] 31 (01/01 0700) BP: (92-118)/(75-97) 115/89 mmHg (01/01 0700) SpO2:  [96 %-100 %] 98 % (01/01 0700) Arterial Line BP: (110)/(75) 110/75 mmHg (12/31 0900) Weight:  [143 lb 1.3 oz (64.9 kg)] 143 lb 1.3 oz (64.9 kg) (01/01 0500) Last BM Date: 09/27/15 Mean arterial Pressure 70-93   Intake/Output:   Intake/Output Summary (Last 24 hours) at 09/27/15 0845 Last data filed at 09/27/15 0700  Gross per 24 hour  Intake 922.68 ml  Output   1170 ml  Net -247.32 ml     Physical Exam: General: NAD HEENT: normal Neck: supple.  Carotids 2+ bilat; no bruits. No thryomegaly or nodule noted. Cor: PMI nondisplaced. RRR. LVAD hum present. Lungs: Clear, mechanical breath sounds. Abdomen: soft, NT, ND, no HSM. No bruits or masses. +BS  Extremities: no cyanosis, clubbing, rash. RUE PICC, LUE radial arterial line. 1-2+ edema into thighs. SCDs present Neuro: alert & orientedx3,  cranial nerves grossly intact. moves all 4 extremities w/o difficulty. Affect pleasant  Telemetry: Sinus with v-pacing in 100s   Labs: Basic Metabolic Panel:  Recent Labs Lab 09/04/2015 1600  09/24/15 0450 09/24/15 1615  09/25/15 0340 09/25/15 1741 09/26/15 0448 09/26/15 1634 09/27/15 0345  NA 148*  < > 147*  --   < > 146* 145 146* 142 144  K 2.9*  < > 4.0  --   < > 3.7 3.5 4.1 3.6 3.6  CL 118*  < > 117*  --   < > 114* 113* 111 110 109  CO2 22  --  20*  --   --  23  --  22  --  23  GLUCOSE 114*  < > 131*  --   < > 125* 113* 127* 189* 129*  BUN 13  < > 13  --   < > 16 19 20  28* 31*  CREATININE 1.42*  < > 1.40* 1.81*  < > 1.97* 2.20* 2.64* 2.80* 3.20*  CALCIUM 7.5*  --  8.3*  --   --  9.0  --  8.8*  --  8.8*  MG 1.8  < > 2.4 2.4  --  2.5*  --  2.3  --  2.5*  PHOS  --   --  3.0  --   --  4.2  --  4.4  --   --   < > =  values in this interval not displayed.  Liver Function Tests:  Recent Labs Lab 2015-10-21 0534 09/24/15 0450 09/25/15 0340 09/26/15 0448 09/27/15 0345  AST 51* 84* 64* 46* 36  ALT 34 28 23 23 21   ALKPHOS 97 66 60 87 103  BILITOT 0.5 4.3* 1.7* 1.0 1.1  PROT 4.7* 5.5* 5.5* 5.6* 5.3*  ALBUMIN 1.6* 3.2* 3.0* 2.6* 2.2*   No results for input(s): LIPASE, AMYLASE in the last 168 hours. No results for input(s): AMMONIA in the last 168 hours.  CBC:  Recent Labs Lab 09/24/15 0450 09/24/15 1615  09/25/15 0340 09/25/15 1741 09/26/15 0448 09/26/15 1634 09/27/15 0345  WBC 10.3 10.7*  --  12.3*  --  15.5*  --  16.2*  NEUTROABS 8.0*  --   --  9.9*  --  12.5*  --  12.9*  HGB 11.6* 10.4*  < > 10.1* 11.9* 10.6* 12.2* 11.0*  HCT 33.7* 29.5*  < > 28.6* 35.0* 29.8* 36.0* 30.1*  MCV 83.8 82.4  --  82.7  --  82.5  --  83.1  PLT 146* 147*  --  158  --  255  --  291  < > = values in this interval not displayed.  INR:  Recent Labs Lab 2015-10-21 1600 09/24/15 0450 09/25/15 0340 09/26/15 0448 09/27/15 0345  INR 1.42 1.21 1.48 1.61* 2.70*    Other  results:  EKG:   Imaging: Dg Chest Port 1 View  09/26/2015  CLINICAL DATA:  Short of breath EXAM: PORTABLE CHEST 1 VIEW COMPARISON:  Yesterday FINDINGS: Endotracheal and NG tubes removed. Lower lung volumes. Right chest tube removed without pneumothorax. Left chest tubes LVAD device are stable. AICD device is stable. Swan-Ganz catheter removed with the right jugular introducer left in place. Cardiomegaly persists. Pulmonary edema and bilateral effusions are not significantly changed. IMPRESSION: Extubated with lower lung volumes. Stable pulmonary edema and bilateral effusions. Right chest tube removed without right pneumothorax. Electronically Signed   By: Jolaine Click M.D.   On: 09/26/2015 08:03     Medications:     Scheduled Medications: . acetaminophen  1,000 mg Oral 4 times per day   Or  . acetaminophen (TYLENOL) oral liquid 160 mg/5 mL  1,000 mg Per Tube 4 times per day  . albumin human  12.5 g Intravenous Q6H  . amiodarone  200 mg Oral Daily  . aspirin EC  325 mg Oral Daily  . bisacodyl  10 mg Oral Daily   Or  . bisacodyl  10 mg Rectal Daily  . docusate sodium  200 mg Oral Daily  . feeding supplement (ENSURE ENLIVE)  237 mL Oral TID BM  . insulin aspart  0-5 Units Subcutaneous QHS  . insulin aspart  0-9 Units Subcutaneous TID WC  . metoCLOPramide (REGLAN) injection  5 mg Intravenous 4 times per day  . pantoprazole  40 mg Oral Daily  . piperacillin-tazobactam (ZOSYN)  IV  2.25 g Intravenous 3 times per day  . sildenafil  40 mg Oral TID  . sodium chloride  10 mL Intravenous Q12H  . warfarin  1 mg Oral q1800  . Warfarin - Physician Dosing Inpatient   Does not apply q1800    Infusions: . sodium chloride Stopped (09/25/15 1100)  . sodium chloride 250 mL (09/27/15 0700)  . sodium chloride Stopped (09/26/15 1600)  . EPINEPHrine 4 mg in dextrose 5% 250 mL infusion (16 mcg/mL) 2 mcg/min (09/27/15 0700)  . lactated ringers Stopped (09/26/15 0000)  . lactated ringers  Stopped  (09/24/15 0700)  . milrinone 0.3 mcg/kg/min (09/27/15 0700)  . norepinephrine (LEVOPHED) Adult infusion Stopped (09/27/15 0600)    PRN Medications: sodium chloride, hydrALAZINE, ondansetron (ZOFRAN) IV, oxyCODONE, sodium chloride   Assessment/Plan   1. Cardiogenic shock: NICM with EF 5%, mild to moderate RV hypokinesis.  S/p St Jude CRT-D.  IABP placed 12/23 - 09/02/2015.  Now s/p HM II LVAD placement 09/05/2015. Co-ox 76%. CVP 6-8 with prominent peripheral edema.  Creatinine up today.  - Hold Lasix.  - Weaned off norepinephrine, remains on epi @ 2 and milrinone 0.3.  MAP > 90, decrease epinephrine to 1.  If MAP remains > 90, stop epinephrine.  2. Atrial fibrillation: Paroxysmal.  NSR with v-pacing.  On coumadin and amiodarone, INR therapeutic.   3. 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  Rhythm stable.  4. AKI on CKD: Creatinine 1.9 prior to admission.  Up to 3.2 today.  Suspect this is hemodynamic at least in part, MAP and PI dropped yesterday with Lasix IV + hydralazine.  Make sure to keep MAP stable in 80s ideally.  Cardiac output looks ok by co-ox.  CVP 6-8, no Lasix today.  Hopefully, creatinine will start to trend down.  5. Pulmonary HTN:  Off NO, on sildenafil 40 tid. 6. Anticoagulation: INR therapeutic.  Decrease ASA to 81 mg daily today.   7. ID: Remains on vancomycin/Zosyn, WBCs steady at 16.  Afebrile.   35 minutes critical care time  I reviewed the LVAD parameters from today, and compared the results to the patient's prior recorded data.  No programming changes were made.  The LVAD is functioning within specified parameters. LVAD interrogation was negative for any significant power changes, alarms or PI events/speed drops.  LVAD equipment check completed and is in good working order.  Back-up equipment present.   Length of Stay: [address] 09/27/2015, 8:45 AM  VAD Team --- VAD ISSUES ONLY--- Pager [phone] (7am - 7am)

## 2015-09-27 NOTE — Progress Notes (Signed)
CT surgery p.m. Rounds  Patient had stable day, ambulated 100 feet in hallway Urine output remaining at 30 cc per hour-holding Lasix because of rising creatinine Mean arterial pressure 90, afebrile, paced rhythm at 90 bpm VAD pocket drain remains with minimal output

## 2015-09-27 DEATH — deceased

## 2015-09-28 ENCOUNTER — Inpatient Hospital Stay (HOSPITAL_COMMUNITY): Payer: Medicare Other

## 2015-09-28 LAB — COMPREHENSIVE METABOLIC PANEL
ALT: 17 U/L (ref 17–63)
AST: 29 U/L (ref 15–41)
Albumin: 2.5 g/dL — ABNORMAL LOW (ref 3.5–5.0)
Alkaline Phosphatase: 102 U/L (ref 38–126)
Anion gap: 11 (ref 5–15)
BUN: 37 mg/dL — ABNORMAL HIGH (ref 6–20)
CO2: 22 mmol/L (ref 22–32)
Calcium: 8.9 mg/dL (ref 8.9–10.3)
Chloride: 113 mmol/L — ABNORMAL HIGH (ref 101–111)
Creatinine, Ser: 3.12 mg/dL — ABNORMAL HIGH (ref 0.61–1.24)
GFR calc Af Amer: 21 mL/min — ABNORMAL LOW (ref >60)
GFR calc non Af Amer: 18 mL/min — ABNORMAL LOW (ref >60)
Glucose, Bld: 102 mg/dL — ABNORMAL HIGH (ref 65–99)
Potassium: 3.5 mmol/L (ref 3.5–5.1)
Sodium: 146 mmol/L — ABNORMAL HIGH (ref 135–145)
Total Bilirubin: 1 mg/dL (ref 0.3–1.2)
Total Protein: 5.4 g/dL — ABNORMAL LOW (ref 6.5–8.1)

## 2015-09-28 LAB — CARBOXYHEMOGLOBIN
Carboxyhemoglobin: 1.6 % — ABNORMAL HIGH (ref 0.5–1.5)
Methemoglobin: 1.1 % (ref 0.0–1.5)
O2 Saturation: 76.2 %
Total hemoglobin: 10.8 g/dL — ABNORMAL LOW (ref 13.5–18.0)

## 2015-09-28 LAB — CBC WITH DIFFERENTIAL/PLATELET
Basophils Absolute: 0.1 10*3/uL (ref 0.0–0.1)
Basophils Relative: 1 %
Eosinophils Absolute: 0.6 10*3/uL (ref 0.0–0.7)
Eosinophils Relative: 5 %
HCT: 30.6 % — ABNORMAL LOW (ref 39.0–52.0)
Hemoglobin: 10.6 g/dL — ABNORMAL LOW (ref 13.0–17.0)
Lymphocytes Relative: 6 %
Lymphs Abs: 0.8 10*3/uL (ref 0.7–4.0)
MCH: 28.5 pg (ref 26.0–34.0)
MCHC: 34.6 g/dL (ref 30.0–36.0)
MCV: 82.3 fL (ref 78.0–100.0)
Monocytes Absolute: 1.5 10*3/uL — ABNORMAL HIGH (ref 0.1–1.0)
Monocytes Relative: 12 %
Neutro Abs: 9.6 10*3/uL — ABNORMAL HIGH (ref 1.7–7.7)
Neutrophils Relative %: 76 %
Platelets: 311 10*3/uL (ref 150–400)
RBC: 3.72 MIL/uL — ABNORMAL LOW (ref 4.22–5.81)
RDW: 15.7 % — ABNORMAL HIGH (ref 11.5–15.5)
WBC: 12.5 10*3/uL — ABNORMAL HIGH (ref 4.0–10.5)

## 2015-09-28 LAB — VANCOMYCIN, RANDOM: Vancomycin Rm: 20 ug/mL

## 2015-09-28 LAB — GLUCOSE, CAPILLARY
GLUCOSE-CAPILLARY: 105 mg/dL — AB (ref 65–99)
GLUCOSE-CAPILLARY: 169 mg/dL — AB (ref 65–99)
GLUCOSE-CAPILLARY: 129 mg/dL — AB (ref 65–99)
Glucose-Capillary: 128 mg/dL — ABNORMAL HIGH (ref 65–99)

## 2015-09-28 LAB — MAGNESIUM: Magnesium: 2.4 mg/dL (ref 1.7–2.4)

## 2015-09-28 LAB — LACTATE DEHYDROGENASE: LDH: 285 U/L — ABNORMAL HIGH (ref 98–192)

## 2015-09-28 LAB — PROTIME-INR
INR: 2.86 — ABNORMAL HIGH (ref 0.00–1.49)
Prothrombin Time: 29.5 seconds — ABNORMAL HIGH (ref 11.6–15.2)

## 2015-09-28 MED ORDER — HYDRALAZINE HCL 10 MG PO TABS
10.0000 mg | ORAL_TABLET | Freq: Three times a day (TID) | ORAL | Status: DC
  Administered 2015-09-28: 10 mg via ORAL
  Filled 2015-09-28: qty 1

## 2015-09-28 MED ORDER — FUROSEMIDE 10 MG/ML IJ SOLN
80.0000 mg | Freq: Once | INTRAMUSCULAR | Status: AC
  Administered 2015-09-28: 80 mg via INTRAVENOUS
  Filled 2015-09-28: qty 8

## 2015-09-28 MED ORDER — POTASSIUM CHLORIDE CRYS ER 20 MEQ PO TBCR
20.0000 meq | EXTENDED_RELEASE_TABLET | Freq: Once | ORAL | Status: AC
  Administered 2015-09-28: 20 meq via ORAL
  Filled 2015-09-28: qty 1

## 2015-09-28 MED ORDER — POTASSIUM CHLORIDE 10 MEQ/50ML IV SOLN
10.0000 meq | INTRAVENOUS | Status: AC
  Administered 2015-09-28 (×2): 10 meq via INTRAVENOUS
  Filled 2015-09-28: qty 50

## 2015-09-28 MED ORDER — ALBUMIN HUMAN 25 % IV SOLN
12.5000 g | Freq: Four times a day (QID) | INTRAVENOUS | Status: AC
  Administered 2015-09-28 – 2015-09-29 (×2): 12.5 g via INTRAVENOUS
  Filled 2015-09-28 (×2): qty 50

## 2015-09-28 MED ORDER — HYDRALAZINE HCL 25 MG PO TABS: 25.0000 mg | ORAL_TABLET | Freq: Three times a day (TID) | ORAL | Status: DC

## 2015-09-28 MED ORDER — VANCOMYCIN HCL IN DEXTROSE 1-5 GM/200ML-% IV SOLN
1000.0000 mg | Freq: Once | INTRAVENOUS | Status: AC
  Administered 2015-09-28: 1000 mg via INTRAVENOUS
  Filled 2015-09-28: qty 200

## 2015-09-28 MED ORDER — POTASSIUM CHLORIDE 10 MEQ/50ML IV SOLN
INTRAVENOUS | Status: AC
  Administered 2015-09-28: 10 meq via INTRAVENOUS
  Filled 2015-09-28: qty 50

## 2015-09-28 MED ORDER — HYDRALAZINE HCL 10 MG PO TABS
10.0000 mg | ORAL_TABLET | Freq: Three times a day (TID) | ORAL | Status: DC
  Administered 2015-09-28 – 2015-09-29 (×3): 10 mg via ORAL
  Filled 2015-09-28 (×2): qty 1

## 2015-09-28 NOTE — Progress Notes (Signed)
Speech Language Pathology Treatment: Dysphagia  Patient Details Name: Brad [name] MRN: [id number] DOB: 1941-05-19 Today's Date: 09/28/2015 Time: 4656-8127 SLP Time Calculation (min) (ACUTE ONLY): 8 min  Assessment / Plan / Recommendation Clinical Impression  Pt agreeable to small amounts of PO intake, which he consumed without incidence. He reports good tolerance of POs at meals, with his wife and RN in agreement; however, CXR from this morning reports slightly increased bilateral airspace opacities. Therefore, will continue current diet but will continue to monitor briefly to ensure tolerance.   HPI HPI: 75 y/o male with h/o PAF, previous CVA, HTN, HL, V. 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 Jude CTR-D (initial ICD 2007 with upgrade to CRT in 2015). Pt s/p LVAD placement 12/28.      SLP Plan  Continue with current plan of care     Recommendations  Diet recommendations: Regular;Thin liquid Liquids provided via: Cup;Straw Medication Administration: Whole meds with liquid Supervision: Patient able to self feed;Intermittent supervision to cue for compensatory strategies Compensations: Slow rate;Small sips/bites Postural Changes and/or Swallow Maneuvers: Seated upright 90 degrees       Oral Care Recommendations: Oral care BID Follow up Recommendations: None Plan: Continue with current plan of care   Brad [name], M.A. CCC-SLP [phone]  Brad [name] 09/28/2015, 1:23 PM

## 2015-09-28 NOTE — Progress Notes (Signed)
CT surgery HeartMate 2 Rounding Note POD#5 Heartmate 2 implantation   Subjective:   Class 4 CHF with cardiogenic shock, hx nonischemic    Cardiomyopathy Preop IABP Hx BPH, TURP and bladder outlet obstruction required cystoscopy to place foley\ probable UTI preop Preop severe protein loss malnutrition, prealbumin < 8 Pre and Post-op acute on chronic renal failure Preop guaiac + stool Preop chronic Eliquis for a-fib Postop acute on chronic anemia  Patient had stable night Pump parameters at goal -- epi, norepi off Mil at 0.3   - co-ox 70 % A-V pacing at 90 Adequate diuresis with rising creatinine now 3.1 Vent weaned and patient extubated, inhaled nitric oxide off. Chest x-ray shows worsening airspace disease-edema today. Will increase Lasix dosing Patient has been out of bed to chair and walked in hallway 100 feet with assistance Pocket drain with minimal drainage and will be removed today White count is improved, Tmax 99-continue Zosyn another 48 hours INR now 2.8 and rising-Will hold Coumadin and continue aspirin 81 mg   LVAD INTERROGATION:  HeartMate II LVAD:  Flow 5.2 liters/min, speed 8800, power 5.2, PI 6.4  Controller intact  Objective:    Vital Signs:   Temp:  [98.2 F (36.8 C)-99.3 F (37.4 C)] 98.5 F (36.9 C) (01/02 0800) Pulse Rate:  [100-117] 104 (01/02 0800) Resp:  [17-30] 22 (01/02 0800) BP: (91-120)/(77-109) 116/87 mmHg (01/02 0800) SpO2:  [94 %-99 %] 99 % (01/02 0800) Weight:  [149 lb 7.6 oz (67.8 kg)] 149 lb 7.6 oz (67.8 kg) (01/02 0600) Last BM Date: 09/27/15 Mean arterial Pressure 70-75  Intake/Output:   Intake/Output Summary (Last 24 hours) at 09/28/15 0950 Last data filed at 09/28/15 0800  Gross per 24 hour  Intake   1251 ml  Output   1240 ml  Net     11 ml     Physical Exam: General:  Well appearing. No resp difficulty HEENT: normal Neck: supple. no JVP  No carotid pulses; no bruits. No lymphadenopathy or thryomegaly appreciated. Cor:  Mechanical heart sounds with LVAD hum present. Lungs: clear Abdomen: soft, nontender, nondistended. No hepatosplenomegaly. No bruits or masses. Good bowel sounds. Extremities: no cyanosis, clubbing, rash, mild- mod extremity  edema Neuro: alert & orientedx3, cranial nerves grossly intact. t  Telemetry: paced rhythm 90  Labs: Basic Metabolic Panel:  Recent Labs Lab 09/24/15 0450 09/24/15 1615  09/25/15 0340 09/25/15 1741 09/26/15 0448 09/26/15 1634 09/27/15 0345 09/28/15 0425  NA 147*  --   < > 146* 145 146* 142 144 146*  K 4.0  --   < > 3.7 3.5 4.1 3.6 3.6 3.5  CL 117*  --   < > 114* 113* 111 110 109 113*  CO2 20*  --   --  23  --  22  --  23 22  GLUCOSE 131*  --   < > 125* 113* 127* 189* 129* 102*  BUN 13  --   < > 16 19 20  28* 31* 37*  CREATININE 1.40* 1.81*  < > 1.97* 2.20* 2.64* 2.80* 3.20* 3.12*  CALCIUM 8.3*  --   --  9.0  --  8.8*  --  8.8* 8.9  MG 2.4 2.4  --  2.5*  --  2.3  --  2.5* 2.4  PHOS 3.0  --   --  4.2  --  4.4  --   --   --   < > = values in this interval not displayed.  Liver Function Tests:  Recent Labs Lab 09/24/15 0450 09/25/15 0340 09/26/15 0448 09/27/15 0345 09/28/15 0425  AST 84* 64* 46* 36 29  ALT ALKPHOS 66 60 87 103 102  BILITOT 4.3* 1.7* 1.0 1.1 1.0  PROT 5.5* 5.5* 5.6* 5.3* 5.4*  ALBUMIN 3.2* 3.0* 2.6* 2.2* 2.5*   No results for input(s): LIPASE, AMYLASE in the last 168 hours. No results for input(s): AMMONIA in the last 168 hours.  CBC:  Recent Labs Lab 09/24/15 0450 09/24/15 1615  09/25/15 0340 09/25/15 1741 09/26/15 0448 09/26/15 1634 09/27/15 0345 09/28/15 0425  WBC 10.3 10.7*  --  12.3*  --  15.5*  --  16.2* 12.5*  NEUTROABS 8.0*  --   --  9.9*  --  12.5*  --  12.9* 9.6*  HGB 11.6* 10.4*  < > 10.1* 11.9* 10.6* 12.2* 11.0* 10.6*  HCT 33.7* 29.5*  < > 28.6* 35.0* 29.8* 36.0* 30.1* 30.6*  MCV 83.8 82.4  --  82.7  --  82.5  --  83.1 82.3  PLT 146* 147*  --  158  --  255  --  291 311  < > = values in  this interval not displayed.  INR:  Recent Labs Lab 09/24/15 0450 09/25/15 0340 09/26/15 0448 09/27/15 0345 09/28/15 0425  INR 1.21 1.48 1.61* 2.70* 2.86*    Other results:  EKG:   Imaging: Dg Chest Port 1 View  09/28/2015  CLINICAL DATA:  75 year old male with shortness of breath. EXAM: PORTABLE CHEST 1 VIEW COMPARISON:  09/27/2015 and prior exams FINDINGS: A right IJ central venous catheter with tip overlying the lower SVC, left ventricular assist device and left face anchor/ ICD again noted. Cardiomegaly again noted. Bilateral airspace opacities/atelectasis, left-greater-than-right, have slightly increased. Bilateral pleural effusions and bibasilar atelectasis again noted. There is no evidence of pneumothorax. IMPRESSION: Slightly increased bilateral airspace opacities/atelectasis without other significant change. Electronically Signed   By: Harmon Pier M.D.   On: 09/28/2015 07:37   Dg Chest Port 1 View  09/27/2015  CLINICAL DATA:  LVAD ( left ventricular assist device) present . History of ventricular tachycardia. Chronic systolic heart failure. EXAM: PORTABLE CHEST 1 VIEW COMPARISON:  One day prior FINDINGS: Right internal jugular line tip at mid SVC. Removal of mediastinal drain. Pacer/AICD device. Left ventricular assist device, unchanged in position. Midline trachea. Moderate cardiomegaly. left chest tubes remain in place. No pneumothorax. Mild interstitial edema. Slight improvement in left worse than right base airspace disease. IMPRESSION: Minimally improved aeration with persistent left greater than right base airspace disease and left pleural effusion. Cardiomegaly with mild interstitial edema. Electronically Signed   By: Jeronimo Greaves M.D.   On: 09/27/2015 09:26     Medications:     Scheduled Medications: . acetaminophen  1,000 mg Oral 4 times per day   Or  . acetaminophen (TYLENOL) oral liquid 160 mg/5 mL  1,000 mg Per Tube 4 times per day  . amiodarone  200 mg Oral Daily   . aspirin  81 mg Oral Daily  . docusate sodium  200 mg Oral Daily  . feeding supplement (ENSURE ENLIVE)  237 mL Oral TID BM  . hydrALAZINE  10 mg Oral 3 times per day  . insulin aspart  0-5 Units Subcutaneous QHS  . insulin aspart  0-9 Units Subcutaneous TID WC  . pantoprazole  40 mg Oral Daily  . piperacillin-tazobactam (ZOSYN)  IV  2.25 g Intravenous 3 times per day  . sildenafil  40  mg Oral TID  . sodium chloride  10 mL Intravenous Q12H  . Warfarin - Physician Dosing Inpatient   Does not apply q1800    Infusions: . sodium chloride Stopped (09/25/15 1100)  . sodium chloride 250 mL (09/28/15 0800)  . EPINEPHrine 4 mg in dextrose 5% 250 mL infusion (16 mcg/mL) Stopped (09/27/15 1100)  . lactated ringers Stopped (09/26/15 0000)  . lactated ringers Stopped (09/24/15 0700)  . milrinone 0.3 mcg/kg/min (09/28/15 0800)  . norepinephrine (LEVOPHED) Adult infusion Stopped (09/27/15 0600)    PRN Medications: sodium chloride, hydrALAZINE, ondansetron (ZOFRAN) IV, oxyCODONE, sodium chloride   Assessment:  Stable with mild-mod RV dysfunction Postoperative acute on chronic renal failure, creatinine 3.1 The patient remains edematous but with rising creatinine will have to be careful of Lasix dosing. Will increase Lasix to 80 mg IV once a day because of worsening x-ray and  weight gain overnight Cont mil 0.3 until renal fx stablilized Continue lcoumadin-  INR 2.9 but hold dose tonight Careful dosing of Lasix  because rising BUN creatinine Remove  VAD pocket drain  Plan/Discussion:      I reviewed the LVAD parameters from today, and compared the results to the patient's prior recorded data.  No programming changes were made.  The LVAD is functioning within specified parameters.  The patient performs LVAD self-test daily.  LVAD interrogation was negative for any significant power changes, alarms or PI events/speed drops.  LVAD equipment check completed and is in good working order.  Back-up  equipment present.   LVAD education done on emergency procedures and precautions and reviewed exit site care.  Length of Stay: [address]  Brad [name] 09/28/2015, 9:50 AM

## 2015-09-28 NOTE — Care Management Important Message (Signed)
Important Message  Patient Details  Name: Brad [name] MRN: [id number] Date of Birth: [birth year]   Medicare Important Message Given:  Yes    [name] 09/28/2015, 11:13 AMImportant Message  Patient Details  Name: Brad [name] MRN: [id number] Date of Birth: December 26, 1940   Medicare Important Message Given:  Yes    [name] G 09/28/2015, 11:13 AM

## 2015-09-28 NOTE — Progress Notes (Signed)
Nutrition Follow-up  DOCUMENTATION CODES:   Not applicable  INTERVENTION:    Continue Ensure Enlive po BID, each supplement provides 350 kcal and 20 grams of protein  NUTRITION DIAGNOSIS:   Increased nutrient needs related to chronic illness as evidenced by estimated needs.  Ongoing  GOAL:   Patient will meet greater than or equal to 90% of their needs  Progressing  MONITOR:   PO intake, Supplement acceptance, Labs, Weight trends, I & O's  REASON FOR ASSESSMENT:   Consult Assessment of nutrition requirement/status  ASSESSMENT:   75 y/o Male with h/o PAF, previous CVA, HTN, HL, V. 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 Jude CTR-D (initial ICD 2007 with upgrade to CRT in 2015) referred by Dr. Bernarda Caffey for further management of cardiogenic shock and consideration of advanced therapies.   S/p HM 2 LVAD placement 09/25/2015. Patient reports good intake; per documentation, he is consuming 30-60% of meals. He is also drinking Ensure Enlive BID, likes the vanilla flavor.  Diet Order:  Diet heart healthy/carb modified Room service appropriate?: Yes; Fluid consistency:: Thin  Skin:  Reviewed, no issues  Last BM:  1/2  Height:   Ht Readings from Last 1 Encounters:  Sep 23, 2015 5\' 7"  (1.702 m)    Weight:   Wt Readings from Last 1 Encounters:  09/28/15 149 lb 7.6 oz (67.8 kg)    Ideal Body Weight:  67 kg  BMI:  Body mass index is 23.41 kg/(m^2).  Estimated Nutritional Needs:   Kcal:  1900-2100  Protein:  100-110 gm  Fluid:  per MD  EDUCATION NEEDS:   No education needs identified at this time  Joaquin Courts, RD, LDN, CNSC Pager [phone] After Hours Pager [phone]

## 2015-09-28 NOTE — Progress Notes (Signed)
Patient ID: Brad [name], male   DOB: Feb 17, 1941, 75 y.o.   MRN: [id number]    Advanced Heart Failure Rounding Note HeartMate 2 Rounding Note  Subjective:    Admitted from [facility] with cardiogenic shock for advanced heart failure as INTERMACS-1.  Milrinone started 12/21. On 12/22 foley placed by Urology requiring urethral dilation -> UTI . ? Septic shock. On vanco/zosyn. IABP placed 12/23.   S/p HM 2 LVAD placement 09/12/2015. Balloon pump removed 08/27/2015 without hematoma.  CVP 11 cm, co-ox 76% this morning.  Creatinine 2.64 => 2.8 => 3.2 => 3.12.    Remains on milrinone 0.3.  Now off epinephrine and amiodarone to po.  Walked about 100 feet yesterday.  Good spirits.   LVAD INTERROGATION:  HeartMate II LVAD:  Flow 7.5 liters/min, speed 8800, power 6.1, PI 5.8.  5 PI event/24 hrs  Objective:    Vital Signs:   Temp:  [98.2 F (36.8 C)-99.3 F (37.4 C)] 98.5 F (36.9 C) (01/02 0800) Pulse Rate:  [100-117] 104 (01/02 0800) Resp:  [17-30] 22 (01/02 0800) BP: (91-120)/(77-109) 116/87 mmHg (01/02 0800) SpO2:  [94 %-99 %] 99 % (01/02 0800) Weight:  [149 lb 7.6 oz (67.8 kg)] 149 lb 7.6 oz (67.8 kg) (01/02 0600) Last BM Date: 09/27/15 Mean arterial Pressure 70-93   Intake/Output:   Intake/Output Summary (Last 24 hours) at 09/28/15 0951 Last data filed at 09/28/15 0800  Gross per 24 hour  Intake   1251 ml  Output   1240 ml  Net     11 ml     Physical Exam: General: NAD HEENT: normal Neck: supple.  JVP 8-9 cm.  Carotids 2+ bilat; no bruits. No thryomegaly or nodule noted. Cor: PMI nondisplaced. RRR. LVAD hum present. Lungs: Decreased breath sounds at bases bilaterally. Abdomen: soft, NT, ND, no HSM. No bruits or masses. +BS  Extremities: no cyanosis, clubbing, rash. RUE PICC, LUE radial arterial line. 1-2+ edema into thighs. SCDs present Neuro: alert & orientedx3, cranial nerves grossly intact. moves all 4 extremities w/o difficulty. Affect pleasant  Telemetry: ?Sinus  with v-pacing in 100s   Labs: Basic Metabolic Panel:  Recent Labs Lab 09/24/15 0450 09/24/15 1615  09/25/15 0340 09/25/15 1741 09/26/15 0448 09/26/15 1634 09/27/15 0345 09/28/15 0425  NA 147*  --   < > 146* 145 146* 142 144 146*  K 4.0  --   < > 3.7 3.5 4.1 3.6 3.6 3.5  CL 117*  --   < > 114* 113* 111 110 109 113*  CO2 20*  --   --  23  --  22  --  23 22  GLUCOSE 131*  --   < > 125* 113* 127* 189* 129* 102*  BUN 13  --   < > 16 19 20  28* 31* 37*  CREATININE 1.40* 1.81*  < > 1.97* 2.20* 2.64* 2.80* 3.20* 3.12*  CALCIUM 8.3*  --   --  9.0  --  8.8*  --  8.8* 8.9  MG 2.4 2.4  --  2.5*  --  2.3  --  2.5* 2.4  PHOS 3.0  --   --  4.2  --  4.4  --   --   --   < > = values in this interval not displayed.  Liver Function Tests:  Recent Labs Lab 09/24/15 0450 09/25/15 0340 09/26/15 0448 09/27/15 0345 09/28/15 0425  AST 84* 64* 46* 36 29  ALT 28 23 23 21 17   ALKPHOS 66 60  87 103 102  BILITOT 4.3* 1.7* 1.0 1.1 1.0  PROT 5.5* 5.5* 5.6* 5.3* 5.4*  ALBUMIN 3.2* 3.0* 2.6* 2.2* 2.5*   No results for input(s): LIPASE, AMYLASE in the last 168 hours. No results for input(s): AMMONIA in the last 168 hours.  CBC:  Recent Labs Lab 09/24/15 0450 09/24/15 1615  09/25/15 0340 09/25/15 1741 09/26/15 0448 09/26/15 1634 09/27/15 0345 09/28/15 0425  WBC 10.3 10.7*  --  12.3*  --  15.5*  --  16.2* 12.5*  NEUTROABS 8.0*  --   --  9.9*  --  12.5*  --  12.9* 9.6*  HGB 11.6* 10.4*  < > 10.1* 11.9* 10.6* 12.2* 11.0* 10.6*  HCT 33.7* 29.5*  < > 28.6* 35.0* 29.8* 36.0* 30.1* 30.6*  MCV 83.8 82.4  --  82.7  --  82.5  --  83.1 82.3  PLT 146* 147*  --  158  --  255  --  291 311  < > = values in this interval not displayed.  INR:  Recent Labs Lab 09/24/15 0450 09/25/15 0340 09/26/15 0448 09/27/15 0345 09/28/15 0425  INR 1.21 1.48 1.61* 2.70* 2.86*    Other results:  EKG:   Imaging: Dg Chest Port 1 View  09/28/2015  CLINICAL DATA:  75 year old male with shortness of breath.  EXAM: PORTABLE CHEST 1 VIEW COMPARISON:  09/27/2015 and prior exams FINDINGS: A right IJ central venous catheter with tip overlying the lower SVC, left ventricular assist device and left face anchor/ ICD again noted. Cardiomegaly again noted. Bilateral airspace opacities/atelectasis, left-greater-than-right, have slightly increased. Bilateral pleural effusions and bibasilar atelectasis again noted. There is no evidence of pneumothorax. IMPRESSION: Slightly increased bilateral airspace opacities/atelectasis without other significant change. Electronically Signed   By: Harmon Pier M.D.   On: 09/28/2015 07:37   Dg Chest Port 1 View  09/27/2015  CLINICAL DATA:  LVAD ( left ventricular assist device) present . History of ventricular tachycardia. Chronic systolic heart failure. EXAM: PORTABLE CHEST 1 VIEW COMPARISON:  One day prior FINDINGS: Right internal jugular line tip at mid SVC. Removal of mediastinal drain. Pacer/AICD device. Left ventricular assist device, unchanged in position. Midline trachea. Moderate cardiomegaly. left chest tubes remain in place. No pneumothorax. Mild interstitial edema. Slight improvement in left worse than right base airspace disease. IMPRESSION: Minimally improved aeration with persistent left greater than right base airspace disease and left pleural effusion. Cardiomegaly with mild interstitial edema. Electronically Signed   By: Jeronimo Greaves M.D.   On: 09/27/2015 09:26     Medications:     Scheduled Medications: . acetaminophen  1,000 mg Oral 4 times per day   Or  . acetaminophen (TYLENOL) oral liquid 160 mg/5 mL  1,000 mg Per Tube 4 times per day  . amiodarone  200 mg Oral Daily  . aspirin  81 mg Oral Daily  . docusate sodium  200 mg Oral Daily  . feeding supplement (ENSURE ENLIVE)  237 mL Oral TID BM  . hydrALAZINE  10 mg Oral 3 times per day  . insulin aspart  0-5 Units Subcutaneous QHS  . insulin aspart  0-9 Units Subcutaneous TID WC  . pantoprazole  40 mg Oral  Daily  . piperacillin-tazobactam (ZOSYN)  IV  2.25 g Intravenous 3 times per day  . sildenafil  40 mg Oral TID  . sodium chloride  10 mL Intravenous Q12H  . Warfarin - Physician Dosing Inpatient   Does not apply q1800    Infusions: . sodium  chloride Stopped (09/25/15 1100)  . sodium chloride 250 mL (09/28/15 0800)  . EPINEPHrine 4 mg in dextrose 5% 250 mL infusion (16 mcg/mL) Stopped (09/27/15 1100)  . lactated ringers Stopped (09/26/15 0000)  . lactated ringers Stopped (09/24/15 0700)  . milrinone 0.3 mcg/kg/min (09/28/15 0800)  . norepinephrine (LEVOPHED) Adult infusion Stopped (09/27/15 0600)    PRN Medications: sodium chloride, hydrALAZINE, ondansetron (ZOFRAN) IV, oxyCODONE, sodium chloride   Assessment/Plan   1. Cardiogenic shock: NICM with EF 5%, mild to moderate RV hypokinesis.  S/p St Jude CRT-D.  IABP placed 12/23 - 09/14/2015.  Now s/p HM II LVAD placement 09/01/2015. Co-ox 76%. CVP 11 with prominent peripheral edema.  Creatinine slightly down compared to yesterday.  - Lasix 80 mg IV x 1, follow UOP and creatinine.   - Now only on milrinone 0.3 gtt, off epinephrine and norepinephrine.  Continue milrinone, good co-ox.  - Added hydralazine 10 mg tid with elevated MAP.  Can titrate up as needed. 2. Atrial fibrillation: Paroxysmal.  ?NSR with v-pacing, will get ECG today.  On coumadin and amiodarone (now po), INR therapeutic.   3. 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  Rhythm stable.  4. AKI on CKD: Creatinine 1.9 prior to admission.  3.2 => 3.12.  Given stabilization of creatinine and volume overload, will get Lasix today.  5. Pulmonary HTN:  Off NO, on sildenafil 40 tid. 6. Anticoagulation: INR therapeutic.  Decreased ASA to 81 mg daily.   7. ID: Remains on vancomycin/Zosyn, WBCs lower today.  Afebrile.   I reviewed the LVAD parameters from today, and compared the results to the patient's prior recorded data.  No programming changes were made.  The LVAD is functioning within specified  parameters. LVAD interrogation was negative for any significant power changes, alarms or PI events/speed drops.  LVAD equipment check completed and is in good working order.  Back-up equipment present.   Length of Stay: [address]  Marca Ancona 09/28/2015, 9:51 AM  VAD Team --- VAD ISSUES ONLY--- Pager [phone] (7am - 7am)

## 2015-09-28 NOTE — Progress Notes (Signed)
Pharmacy Antibiotic Follow-up Note  Brad [name] is a 75 y.o. year-old male admitted on September 27, 2015.  The patient is currently on day 11 of vancomycin for LVAD post-op. He remains afebrile and WBC have trended down to 12.5. Vancomycin random level this AM was back down to goal at 20 with no vancomycin since 12/28 and SCr now slowly trending back down 3.2>3.12. Patient's CrCl < 20, continue Zosyn 2.25g IV q8h.  Assessment/Plan: - With level back at goal, will re-dose with vancomycin 1000 mg IV x 1 which should last through the planned stop date of 1/4 - Continue to monitor for improvement in renal function  - Planned antibiotic stop date of 09/30/15 per CVTS   Temp (24hrs), Avg:98.7 F (37.1 C), Min:98.2 F (36.8 C), Max:99.3 F (37.4 C)   Recent Labs Lab 09/24/15 1615 09/25/15 0340 09/26/15 0448 09/27/15 0345 09/28/15 0425  WBC 10.7* 12.3* 15.5* 16.2* 12.5*     Recent Labs Lab 09/25/15 1741 09/26/15 0448 09/26/15 1634 09/27/15 0345 09/28/15 0425  CREATININE 2.20* 2.64* 2.80* 3.20* 3.12*   Estimated Creatinine Clearance: 19.4 mL/min (by C-G formula based on Cr of 3.12).    No Known Allergies  Antimicrobials this admission: 12/22 PO Septra x 1 dose, then decompensated so broaden to zosyn 12/22 Zosyn EI>> (09/30/15) 12/23 Vanc > (09/30/15) 12/ 29 rifampin x 1 dose  Levels/dose changes this admission: (Last dose 12/29) 12/30 VT 35 12/31 VR 37 1/1 VR 26 1/2 VR 20  Microbiology results: 12/22 Bcx ng final 12/22 Ucx insignif growth final MRSA PCR neg  Thank you for allowing pharmacy to be a part of this patient's care.  Tyler Deis. Bonnye Fava, PharmD, BCPS, CPP Clinical Pharmacist Pager: [phone] Phone: [phone] 09/28/2015 11:12 AM

## 2015-09-28 NOTE — Progress Notes (Signed)
Physical Therapy Treatment Patient Details Name: Brad [name] MRN: [id number] DOB: [birth year] Today's Date: 09/28/2015    History of Present Illness 75 y/o male with h/o PAF, previous CVA, HTN, HL, V. 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 Jude CTR-D (initial ICD 2007 with upgrade to CRT in 2015). Pt s/p LVAD placement 12/28.    PT Comments    Pt admitted with/for LVAD placement.  Pt currently limited functionally due to the problems listed. ( See problems list.)   Pt will benefit from PT to maximize function and safety in order to get ready for next venue listed below.   Follow Up Recommendations  CIR;Supervision/Assistance - 24 hour     Equipment Recommendations  Rolling walker with 5" wheels;3in1 (PT)    Recommendations for Other Services Rehab consult     Precautions / Restrictions Precautions Precautions: Fall;Sternal Precaution Comments: LVAD, 2 chest tubes, PI dropping with activity Restrictions Other Position/Activity Restrictions: sternal precautions    Mobility  Bed Mobility   Bed Mobility: Sit to Sidelying         Sit to sidelying: Max assist General bed mobility comments: assist to get legs in  Transfers Overall transfer level: Needs assistance Equipment used: Pushed w/c Transfers: Sit to/from Stand Sit to Stand: +2 safety/equipment;Min assist;Mod assist         General transfer comment: cues/reinforcement for sternal precautions and both assist to come forward and power up;  Ambulation/Gait Ambulation/Gait assistance: Min assist;+2 physical assistance Ambulation Distance (Feet): 150 Feet Assistive device:  (pushed w/c) Gait Pattern/deviations: Step-through pattern;Scissoring Gait velocity: slower Gait velocity interpretation: Below normal speed for age/gender General Gait Details: pt needed cues and assist to maneuver the w/c around obstacles.  Mildly unsteady throughout with some worsening and scissoring with  fatigue.   Stairs            Wheelchair Mobility    Modified Rankin (Stroke Patients Only)       Balance Overall balance assessment: Needs assistance Sitting-balance support: Single extremity supported Sitting balance-Leahy Scale: Poor Sitting balance - Comments: still with posterior lean and need for minimal assist     Standing balance-Leahy Scale: Poor Standing balance comment: reliant on the RW                    Cognition Arousal/Alertness: Awake/alert Behavior During Therapy: Flat affect Overall Cognitive Status: Within Functional Limits for tasks assessed                      Exercises      General Comments        Pertinent Vitals/Pain Pain Assessment: No/denies pain    Home Living                      Prior Function            PT Goals (current goals can now be found in the care plan section) Acute Rehab PT Goals Patient Stated Goal: did not state  PT Goal Formulation: With patient Time For Goal Achievement: 10/12/15 Potential to Achieve Goals: Good Progress towards PT goals: Progressing toward goals    Frequency  Min 3X/week    PT Plan Discharge plan needs to be updated    Co-evaluation             End of Session Equipment Utilized During Treatment: Oxygen Activity Tolerance: Patient tolerated treatment well Patient left: in bed;with call  bell/phone within reach;with nursing/sitter in room;with family/visitor present     Time: 1205-1225 PT Time Calculation (min) (ACUTE ONLY): 20 min  Charges:  $Gait Training: 8-22 mins                    G Codes:      Tae Robak, [name] 09/28/2015, 12:56 PM 09/28/2015  Thornton Bing, PT [phone]  (pager)

## 2015-09-29 ENCOUNTER — Inpatient Hospital Stay (HOSPITAL_COMMUNITY): Payer: Medicare Other

## 2015-09-29 LAB — COMPREHENSIVE METABOLIC PANEL
ALT: 16 U/L — ABNORMAL LOW (ref 17–63)
AST: 26 U/L (ref 15–41)
Albumin: 2.5 g/dL — ABNORMAL LOW (ref 3.5–5.0)
Alkaline Phosphatase: 115 U/L (ref 38–126)
Anion gap: 9 (ref 5–15)
BUN: 35 mg/dL — ABNORMAL HIGH (ref 6–20)
CO2: 25 mmol/L (ref 22–32)
Calcium: 8.9 mg/dL (ref 8.9–10.3)
Chloride: 111 mmol/L (ref 101–111)
Creatinine, Ser: 2.9 mg/dL — ABNORMAL HIGH (ref 0.61–1.24)
GFR calc Af Amer: 23 mL/min — ABNORMAL LOW (ref >60)
GFR calc non Af Amer: 20 mL/min — ABNORMAL LOW (ref >60)
Glucose, Bld: 125 mg/dL — ABNORMAL HIGH (ref 65–99)
Potassium: 3.4 mmol/L — ABNORMAL LOW (ref 3.5–5.1)
Sodium: 145 mmol/L (ref 135–145)
Total Bilirubin: 1.2 mg/dL (ref 0.3–1.2)
Total Protein: 5.6 g/dL — ABNORMAL LOW (ref 6.5–8.1)

## 2015-09-29 LAB — GLUCOSE, CAPILLARY
GLUCOSE-CAPILLARY: 113 mg/dL — AB (ref 65–99)
GLUCOSE-CAPILLARY: 111 mg/dL — AB (ref 65–99)
GLUCOSE-CAPILLARY: 105 mg/dL — AB (ref 65–99)
Glucose-Capillary: 152 mg/dL — ABNORMAL HIGH (ref 65–99)
Glucose-Capillary: 144 mg/dL — ABNORMAL HIGH (ref 65–99)

## 2015-09-29 LAB — CBC WITH DIFFERENTIAL/PLATELET
Basophils Absolute: 0.1 10*3/uL (ref 0.0–0.1)
Basophils Relative: 1 %
Eosinophils Absolute: 0.4 10*3/uL (ref 0.0–0.7)
Eosinophils Relative: 4 %
HCT: 28.8 % — ABNORMAL LOW (ref 39.0–52.0)
Hemoglobin: 10.1 g/dL — ABNORMAL LOW (ref 13.0–17.0)
Lymphocytes Relative: 10 %
Lymphs Abs: 0.8 10*3/uL (ref 0.7–4.0)
MCH: 28.9 pg (ref 26.0–34.0)
MCHC: 35.1 g/dL (ref 30.0–36.0)
MCV: 82.5 fL (ref 78.0–100.0)
Monocytes Absolute: 1 10*3/uL (ref 0.1–1.0)
Monocytes Relative: 11 %
Neutro Abs: 6.6 10*3/uL (ref 1.7–7.7)
Neutrophils Relative %: 74 %
Platelets: 323 10*3/uL (ref 150–400)
RBC: 3.49 MIL/uL — ABNORMAL LOW (ref 4.22–5.81)
RDW: 15.7 % — ABNORMAL HIGH (ref 11.5–15.5)
WBC: 8.8 10*3/uL (ref 4.0–10.5)

## 2015-09-29 LAB — CULTURE, RESPIRATORY W GRAM STAIN
Culture: NO GROWTH
Special Requests: NORMAL

## 2015-09-29 LAB — C DIFFICILE QUICK SCREEN W PCR REFLEX
C DIFFICLE (CDIFF) ANTIGEN: NEGATIVE
C Diff interpretation: NEGATIVE
C Diff toxin: NEGATIVE

## 2015-09-29 LAB — CARBOXYHEMOGLOBIN
Carboxyhemoglobin: 2 % — ABNORMAL HIGH (ref 0.5–1.5)
Methemoglobin: 1 % (ref 0.0–1.5)
O2 Saturation: 65.4 %
Total hemoglobin: 10.5 g/dL — ABNORMAL LOW (ref 13.5–18.0)

## 2015-09-29 LAB — URINALYSIS, ROUTINE W REFLEX MICROSCOPIC
Bilirubin Urine: NEGATIVE
Glucose, UA: NEGATIVE mg/dL
Ketones, ur: NEGATIVE mg/dL
Nitrite: NEGATIVE
Protein, ur: 30 mg/dL — AB
Specific Gravity, Urine: 1.017 (ref 1.005–1.030)
pH: 5 (ref 5.0–8.0)

## 2015-09-29 LAB — URINE MICROSCOPIC-ADD ON

## 2015-09-29 LAB — PROTIME-INR
INR: 3.46 — ABNORMAL HIGH (ref 0.00–1.49)
Prothrombin Time: 34.1 seconds — ABNORMAL HIGH (ref 11.6–15.2)

## 2015-09-29 LAB — LACTATE DEHYDROGENASE: LDH: 266 U/L — ABNORMAL HIGH (ref 98–192)

## 2015-09-29 LAB — MAGNESIUM: Magnesium: 2.2 mg/dL (ref 1.7–2.4)

## 2015-09-29 MED ORDER — SODIUM CHLORIDE 0.9 % IV SOLN
250.0000 mg | Freq: Three times a day (TID) | INTRAVENOUS | Status: DC
  Administered 2015-09-29 – 2015-10-03 (×13): 250 mg via INTRAVENOUS
  Filled 2015-09-29 (×15): qty 250

## 2015-09-29 MED ORDER — FUROSEMIDE 10 MG/ML IJ SOLN
40.0000 mg | Freq: Once | INTRAMUSCULAR | Status: AC
  Administered 2015-09-29: 40 mg via INTRAVENOUS
  Filled 2015-09-29: qty 4

## 2015-09-29 MED ORDER — POTASSIUM CHLORIDE 10 MEQ/50ML IV SOLN
10.0000 meq | INTRAVENOUS | Status: AC
  Administered 2015-09-29 (×3): 10 meq via INTRAVENOUS
  Filled 2015-09-29 (×2): qty 50

## 2015-09-29 MED ORDER — HYDRALAZINE HCL 25 MG PO TABS
25.0000 mg | ORAL_TABLET | Freq: Three times a day (TID) | ORAL | Status: DC
  Administered 2015-09-29 – 2015-10-04 (×14): 25 mg via ORAL
  Filled 2015-09-29 (×14): qty 1

## 2015-09-29 MED ORDER — POTASSIUM CHLORIDE CRYS ER 20 MEQ PO TBCR
20.0000 meq | EXTENDED_RELEASE_TABLET | Freq: Once | ORAL | Status: AC
  Administered 2015-09-29: 20 meq via ORAL
  Filled 2015-09-29: qty 1

## 2015-09-29 MED ORDER — VANCOMYCIN HCL IN DEXTROSE 750-5 MG/150ML-% IV SOLN
750.0000 mg | Freq: Two times a day (BID) | INTRAVENOUS | Status: AC
  Administered 2015-09-29: 750 mg via INTRAVENOUS
  Filled 2015-09-29: qty 150

## 2015-09-29 NOTE — Progress Notes (Signed)
CT Surgery Procedure\  R Subclavian TLC placed for iv access under sterile prep and drape- will remove ol R IJ line  Blood and urine cultures sent for temp 100.5 One dose vanco given  chg Zosyn to Imipenum- mdose per pharm

## 2015-09-29 NOTE — Progress Notes (Signed)
Patient ID: Brad [name], male   DOB: [birth year], 75 y.o.   MRN: [id number]    Advanced Heart Failure Rounding Note HeartMate 2 Rounding Note  Subjective:    Admitted from [facility] with cardiogenic shock for advanced heart failure as INTERMACS-1.  Milrinone started 12/21. On 12/22 foley placed by Urology requiring urethral dilation -> UTI . ? Septic shock. On vanco/zosyn. IABP placed 12/23.   S/p HM 2 LVAD placement 08/30/2015. Balloon pump removed 09/04/2015 without hematoma.  CVP recorded as 7 cm at 0400, connection no longer in room.  Co-ox 65.4% this morning.  Creatinine 2.64 => 2.8 => 3.2 => 3.12 => 2.90.  K 3.4  Milrinone remains at 0.3. Walked 150 feet with PT yesterday. Recommened CIR. Out ~ 2 L x 24 hrs. Weight shows down 7 lbs.  Denies SOB or CP.   INR 3.46, LDH 266  LVAD INTERROGATION:  HeartMate II LVAD:  Flow 6.0 liters/min, speed 8800, power 5.5, PI 6.5.  16 PI event/24 hrs  Objective:    Vital Signs:   Temp:  [97.3 F (36.3 C)-100.5 F (38.1 C)] 100.5 F (38.1 C) (01/03 0416) Pulse Rate:  [98-121] 113 (01/03 0700) Resp:  [20-37] 30 (01/03 0700) BP: (91-116)/(76-95) 105/86 mmHg (01/03 0700) SpO2:  [96 %-100 %] 97 % (01/03 0700) Weight:  [142 lb 6.4 oz (64.592 kg)] 142 lb 6.4 oz (64.592 kg) (01/03 0500) Last BM Date: 09/28/15 Mean arterial Pressure 84-94  Intake/Output:   Intake/Output Summary (Last 24 hours) at 09/29/15 0709 Last data filed at 09/29/15 0700  Gross per 24 hour  Intake 1348.4 ml  Output   3500 ml  Net -2151.6 ml     Physical Exam: General: NAD, Elderly appearing.  HEENT: normal Neck: supple.  JVP 7-8 cm.  Carotids 2+ bilat; no bruits. No thyromegaly or lymphadenopathy appreciated. Cor: PMI nondisplaced. RRR. LVAD hum present. Lungs: Decreased bases bilaterally. Normal effort Abdomen: soft, non-tender, non-distended, no HSM. No bruits or masses. Bowel sounds present. Extremities: no cyanosis, clubbing, rash. RUE PICC, LUE radial  arterial line. Continues with 1-2+ edema into thighs. SCDs present Neuro: alert & orientedx3, cranial nerves grossly intact. moves all 4 extremities w/o difficulty. Affect pleasant  Telemetry: reviewed personally, a sensed v pacing in 110s, occasional PVCs including NSVT 3-5 beat runs.    Labs: Basic Metabolic Panel:  Recent Labs Lab 09/24/15 0450  09/25/15 0340  09/26/15 0448 09/26/15 1634 09/27/15 0345 09/28/15 0425 09/29/15 0515  NA 147*  < > 146*  < > 146* 142 144 146* 145  K 4.0  < > 3.7  < > 4.1 3.6 3.6 3.5 3.4*  CL 117*  < > 114*  < > 111 110 109 113* 111  CO2 20*  --  23  --  22  --  23 22 25   GLUCOSE 131*  < > 125*  < > 127* 189* 129* 102* 125*  BUN 13  < > 16  < > 20 28* 31* 37* 35*  CREATININE 1.40*  < > 1.97*  < > 2.64* 2.80* 3.20* 3.12* 2.90*  CALCIUM 8.3*  --  9.0  --  8.8*  --  8.8* 8.9 8.9  MG 2.4  < > 2.5*  --  2.3  --  2.5* 2.4 2.2  PHOS 3.0  --  4.2  --  4.4  --   --   --   --   < > = values in this interval not displayed.  Liver Function Tests:  Recent Labs Lab 09/25/15 0340 09/26/15 0448 09/27/15 0345 09/28/15 0425 09/29/15 0515  AST 64* 46* 36 29 26  ALT 23 23 21 17  16*  ALKPHOS 60 87 103 102 115  BILITOT 1.7* 1.0 1.1 1.0 1.2  PROT 5.5* 5.6* 5.3* 5.4* 5.6*  ALBUMIN 3.0* 2.6* 2.2* 2.5* 2.5*   No results for input(s): LIPASE, AMYLASE in the last 168 hours. No results for input(s): AMMONIA in the last 168 hours.  CBC:  Recent Labs Lab 09/25/15 0340  09/26/15 0448 09/26/15 1634 09/27/15 0345 09/28/15 0425 09/29/15 0515  WBC 12.3*  --  15.5*  --  16.2* 12.5* 8.8  NEUTROABS 9.9*  --  12.5*  --  12.9* 9.6* 6.6  HGB 10.1*  < > 10.6* 12.2* 11.0* 10.6* 10.1*  HCT 28.6*  < > 29.8* 36.0* 30.1* 30.6* 28.8*  MCV 82.7  --  82.5  --  83.1 82.3 82.5  PLT 158  --  255  --  291 311 323  < > = values in this interval not displayed.  INR:  Recent Labs Lab 09/25/15 0340 09/26/15 0448 09/27/15 0345 09/28/15 0425 09/29/15 0515  INR 1.48 1.61*  2.70* 2.86* 3.46*    Other results:  EKG:   Imaging: Dg Chest Port 1 View  09/28/2015  CLINICAL DATA:  75 year old male with shortness of breath. EXAM: PORTABLE CHEST 1 VIEW COMPARISON:  09/27/2015 and prior exams FINDINGS: A right IJ central venous catheter with tip overlying the lower SVC, left ventricular assist device and left face anchor/ ICD again noted. Cardiomegaly again noted. Bilateral airspace opacities/atelectasis, left-greater-than-right, have slightly increased. Bilateral pleural effusions and bibasilar atelectasis again noted. There is no evidence of pneumothorax. IMPRESSION: Slightly increased bilateral airspace opacities/atelectasis without other significant change. Electronically Signed   By: Harmon Pier M.D.   On: 09/28/2015 07:37     Medications:     Scheduled Medications: . amiodarone  200 mg Oral Daily  . aspirin  81 mg Oral Daily  . docusate sodium  200 mg Oral Daily  . feeding supplement (ENSURE ENLIVE)  237 mL Oral TID BM  . hydrALAZINE  10 mg Oral 3 times per day  . insulin aspart  0-5 Units Subcutaneous QHS  . insulin aspart  0-9 Units Subcutaneous TID WC  . pantoprazole  40 mg Oral Daily  . piperacillin-tazobactam (ZOSYN)  IV  2.25 g Intravenous 3 times per day  . sildenafil  40 mg Oral TID  . sodium chloride  10 mL Intravenous Q12H  . Warfarin - Physician Dosing Inpatient   Does not apply q1800    Infusions: . sodium chloride Stopped (09/25/15 1100)  . sodium chloride 250 mL (09/29/15 0400)  . EPINEPHrine 4 mg in dextrose 5% 250 mL infusion (16 mcg/mL) Stopped (09/27/15 1100)  . lactated ringers Stopped (09/26/15 0000)  . lactated ringers Stopped (09/24/15 0700)  . milrinone 0.3 mcg/kg/min (09/29/15 0400)  . norepinephrine (LEVOPHED) Adult infusion Stopped (09/27/15 0600)    PRN Medications: sodium chloride, hydrALAZINE, ondansetron (ZOFRAN) IV, oxyCODONE, sodium chloride   Assessment/Plan   1. Cardiogenic shock: NICM with EF 5%, mild to  moderate RV hypokinesis.  S/p St Jude CRT-D.  IABP placed 12/23 - 10/22/2015.  Now s/p HM II LVAD placement 10/18/2015. Co-ox 65.4%. Still has prominent peripheral edema. CVP 7 this am, but no longer connected and removed from room. Will reorder for connection.  Creatinine improved from yesterday.   - Out ~2 L but remains with 1-2+ edema into thighs  after 80 mg IV lasix yesterday. Will likely need repeat today.  - Off epinephrine and norepinephrine.  Continue milrinone at 0.3 for now with Coox 65 - Continue hydralazine 10 mg tid 2. Atrial fibrillation: Paroxysmal.    - EKG 09/27/14 shows a sensed V paced at 111 bpm with occasional PVCs.  On coumadin and po amiodarone, INR supra-therapeutic this am. Physician dosing.  3. 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   - 3-5 beat NSVT runs on tele.  K 3.4. Will give 20 meq today. May need more if requires further lasix.  Mg stable. 4. AKI on CKD: Creatinine 1.9 prior to admission.  3.2 => 3.12 => 2.90 - Got dose of IV lasix yesterday. Creatinine trended down.  5. Pulmonary HTN:   - NO d/c'd 09/26/15 - Continue sildenafil 40 tid. 6. Anticoagulation:  - INR supra-therapeutic. Did NOT get warfarin last night. INR 2.86 => 3.46.   - On ASA 81 mg daily.   7. ID:  - Remains on vanc/zosyn. WBCs down to 8.8  Though temp 100.5 this am.  8.Conditioning - Walked 150 feet yesterday with PT. They recommend CIR.   I reviewed the LVAD parameters from today, and compared the results to the patient's prior recorded data.  No programming changes were made.  The LVAD is functioning within specified parameters. LVAD interrogation was negative for any significant power changes, alarms or PI events/speed drops.  LVAD equipment check completed and is in good working order.  Back-up equipment present.   Length of Stay: [address]  Graciella Freer PA-C 09/29/2015, 7:09 AM  VAD Team --- VAD ISSUES ONLY--- Pager [phone] (7am - 7am)  Patient seen with PA, agree with the above note.    Febrile  to 102.5 this morning, has had diarrhea. WBCs lower however.  He has LLL infiltrate on CXR (stable).  - C difficile colitis or HCAP are concerns. Has been on vanco/Zosyn.  Will pan-culture (sputum, urine, blood) and send stool for C diff.  Continue milrinone at current dose. Good co-ox.    Good diuresis yesterday, weight down.  Having more PI events now.  With diarrhea and fever, will hold off on IV Lasix today.  Reassess tomorrow.   Creatinine starting to come down, 2.9 now.   Flat affect, suspect component of depression.  More weak today in setting of fever.   MAP remains 90s-100s => increase hydralazine to 25 mg tid.    35 minutes critical care time.   Marca Ancona 09/29/2015 1:22 PM

## 2015-09-29 NOTE — Progress Notes (Signed)
CSW met with patient at bedside. Patient had just completed physical therapy and was tired sitting up in chair at bedside. Patient states he is doing ok and coming along with the recovery process. Wife not present but will return tomorrow for visit. CSW provided encouragement and will continue to follow throughout recovery. Raquel Sarna, [place]

## 2015-09-29 NOTE — Progress Notes (Signed)
Utilization review completed.  

## 2015-09-29 NOTE — Progress Notes (Signed)
  LVAD Exit Site:  VAD dressing removed and site care performed using sterile technique. Drive line exit site cleaned with Chlora prep applicators x 2, allowed to dry, and gauze dressing with aquacel strip re-applied. Exit site with no tissue ingrowth, two sutures intact. The velour is fully implanted at exit site. Small amount serous drainage noted, no redness, tenderness, or foul odor noted. Drive line anchor re-applied. Driveline dressing is being changed daily per sterile technique.

## 2015-09-29 NOTE — Progress Notes (Signed)
CT surgery HeartMate 2 Rounding Note POD#6 Heartmate 2 implantation   Subjective:   Class 4 CHF with cardiogenic shock, hx nonischemic    Cardiomyopathy Preop IABP Hx BPH, TURP and bladder outlet obstruction required cystoscopy to place foley\ probable UTI preop Preop severe protein loss malnutrition, prealbumin < 8 Pre and Post-op acute on chronic renal failure Preop guaiac + stool Preop chronic Eliquis for a-fib Postop acute on chronic anemia  Patient had stable night but has fever this am 100.5 Pump parameters at goal -- epi, norepi off Mil at 0.3   - co-ox 65 % A-V pacing at 90 Adequate diuresis with  creatinine  Stable 3.0 Vent weaned and patient extubated, inhaled nitric oxide off.  PAL Chest x-ray shows worsening airspace disease today with moderate L effusion. Will increase Lasix dosing and do thoracentesis when INR < 2.5  Increase antibiotic coverage for possible L pneumonia Patient has been out of bed to chair and walked in hallway 150 feet with assistance Pocket drain removed  Incisions clean, dry   INR now  3.4 holding coumadin  continue aspirin 81 mg  New central line placed and line from OR removed   LVAD INTERROGATION:  HeartMate II LVAD:  Flow 5.2 liters/min, speed 8800, power 5.2, PI 6.4  Controller intact  Objective:    Vital Signs:   Temp:  [97.3 F (36.3 C)-102.5 F (39.2 C)] 100.4 F (38 C) (01/03 1242) Pulse Rate:  [98-121] 115 (01/03 1400) Resp:  [20-37] 30 (01/03 1400) BP: (91-117)/(76-98) 107/95 mmHg (01/03 1400) SpO2:  [95 %-100 %] 95 % (01/03 1400) Weight:  [142 lb 6.4 oz (64.592 kg)] 142 lb 6.4 oz (64.592 kg) (01/03 0500) Last BM Date: 09/28/15 Mean arterial Pressure 70-75  Intake/Output:   Intake/Output Summary (Last 24 hours) at 09/29/15 1433 Last data filed at 09/29/15 1200  Gross per 24 hour  Intake 1072.3 ml  Output   2200 ml  Net -1127.7 ml     Physical Exam: General:  Well appearing. No resp difficulty HEENT:  normal Neck: supple. no JVP  No carotid pulses; no bruits. No lymphadenopathy or thryomegaly appreciated. Cor: Mechanical heart sounds with LVAD hum present. Lungs: clear Abdomen: soft, nontender, nondistended. No hepatosplenomegaly. No bruits or masses. Good bowel sounds. Extremities: no cyanosis, clubbing, rash, mild- mod extremity  edema Neuro: alert & orientedx3, cranial nerves grossly intact. t  Telemetry: paced rhythm 90  Labs: Basic Metabolic Panel:  Recent Labs Lab 09/24/15 0450  09/25/15 0340  09/26/15 0448 09/26/15 1634 09/27/15 0345 09/28/15 0425 09/29/15 0515  NA 147*  < > 146*  < > 146* 142 144 146* 145  K 4.0  < > 3.7  < > 4.1 3.6 3.6 3.5 3.4*  CL 117*  < > 114*  < > 111 110 109 113* 111  CO2 20*  --  23  --  22  --  23 22 25   GLUCOSE 131*  < > 125*  < > 127* 189* 129* 102* 125*  BUN 13  < > 16  < > 20 28* 31* 37* 35*  CREATININE 1.40*  < > 1.97*  < > 2.64* 2.80* 3.20* 3.12* 2.90*  CALCIUM 8.3*  --  9.0  --  8.8*  --  8.8* 8.9 8.9  MG 2.4  < > 2.5*  --  2.3  --  2.5* 2.4 2.2  PHOS 3.0  --  4.2  --  4.4  --   --   --   --   < > =  values in this interval not displayed.  Liver Function Tests:  Recent Labs Lab 09/25/15 0340 09/26/15 0448 09/27/15 0345 09/28/15 0425 09/29/15 0515  AST 64* 46* 36 29 26  ALT 16*  ALKPHOS 60 87 103 102 115  BILITOT 1.7* 1.0 1.1 1.0 1.2  PROT 5.5* 5.6* 5.3* 5.4* 5.6*  ALBUMIN 3.0* 2.6* 2.2* 2.5* 2.5*   No results for input(s): LIPASE, AMYLASE in the last 168 hours. No results for input(s): AMMONIA in the last 168 hours.  CBC:  Recent Labs Lab 09/25/15 0340  09/26/15 0448 09/26/15 1634 09/27/15 0345 09/28/15 0425 09/29/15 0515  WBC 12.3*  --  15.5*  --  16.2* 12.5* 8.8  NEUTROABS 9.9*  --  12.5*  --  12.9* 9.6* 6.6  HGB 10.1*  < > 10.6* 12.2* 11.0* 10.6* 10.1*  HCT 28.6*  < > 29.8* 36.0* 30.1* 30.6* 28.8*  MCV 82.7  --  82.5  --  83.1 82.3 82.5  PLT 158  --  255  --  291 311 323  < > = values in  this interval not displayed.  INR:  Recent Labs Lab 09/25/15 0340 09/26/15 0448 09/27/15 0345 09/28/15 0425 09/29/15 0515  INR 1.48 1.61* 2.70* 2.86* 3.46*    Other results:  EKG:   Imaging: Dg Chest 2 View  09/29/2015  CLINICAL DATA:  Acute on chronic CHF with left ventricular assist device; cardiogenic shock, acute renal injury. EXAM: CHEST  2 VIEW COMPARISON:  Portable chest x-ray of September 28, 2015 FINDINGS: The right lung is well-expanded. The right upper lobe is clear today. There is a small right pleural effusion. Density at the left lung base obscures the costophrenic angle and opacifies the retrocardiac region. The left upper lobe is clearer today. The cardiac silhouette remains enlarged. The left ventricular assist device is in stable position radiographically. The implantable pacemaker defibrillator is unchanged in appearance. There are 7 intact sternal wires. A right internal jugular venous catheter tip projects over the midportion of the SVC. IMPRESSION: 1. Improved appearance of the upper lobes bilaterally consistent with resolving interstitial edema. 2. Persistent left lower lobe atelectasis or pneumonia. Persistent bilateral pleural effusions. 3. Stable cardiomegaly. Stable support tube appearance radiographically. Electronically Signed   By: David  [place] M.D.   On: 09/29/2015 07:44   Dg Chest Port 1 View  09/29/2015  CLINICAL DATA:  Recent left ventricular assist device placement. Central catheter placement EXAM: PORTABLE CHEST 1 VIEW COMPARISON:  Study obtained earlier in the day FINDINGS: There is a new right central catheter with tip in the superior vena cava. A second central catheter also has its tip in the superior vena cava. There is a left ventricular assist device as well as a defibrillator with leads attached to the right atrium, right ventricle, and left ventricle, stable. Temporary pacemaker wires are also attached to the right heart. There is no demonstrable  pneumothorax. There is a sizable left pleural effusion with a smaller right pleural effusion. There is airspace consolidation left lower lobe, stable. There is no new opacity. Heart is enlarged with mild pulmonary venous hypertension. IMPRESSION: Catheter positions as described without pneumothorax. Bilateral pleural effusions, larger on the left than on the right. Stable airspace consolidation left lower lobe. Stable cardiomegaly. No new opacity apparent. Electronically Signed   By: Bretta Bang III M.D.   On: 09/29/2015 09:08   Dg Chest Port 1 View  09/28/2015  CLINICAL DATA:  75 year old male with shortness of breath. EXAM:  PORTABLE CHEST 1 VIEW COMPARISON:  09/27/2015 and prior exams FINDINGS: A right IJ central venous catheter with tip overlying the lower SVC, left ventricular assist device and left face anchor/ ICD again noted. Cardiomegaly again noted. Bilateral airspace opacities/atelectasis, left-greater-than-right, have slightly increased. Bilateral pleural effusions and bibasilar atelectasis again noted. There is no evidence of pneumothorax. IMPRESSION: Slightly increased bilateral airspace opacities/atelectasis without other significant change. Electronically Signed   By: Harmon Pier M.D.   On: 09/28/2015 07:37     Medications:     Scheduled Medications: . amiodarone  200 mg Oral Daily  . aspirin  81 mg Oral Daily  . docusate sodium  200 mg Oral Daily  . feeding supplement (ENSURE ENLIVE)  237 mL Oral TID BM  . furosemide  40 mg Intravenous Once  . hydrALAZINE  25 mg Oral 3 times per day  . imipenem-cilastatin  250 mg Intravenous Q8H  . insulin aspart  0-5 Units Subcutaneous QHS  . insulin aspart  0-9 Units Subcutaneous TID WC  . pantoprazole  40 mg Oral Daily  . potassium chloride  10 mEq Intravenous Q1 Hr x 3  . sildenafil  40 mg Oral TID  . sodium chloride  10 mL Intravenous Q12H  . Warfarin - Physician Dosing Inpatient   Does not apply q1800    Infusions: . sodium  chloride Stopped (09/25/15 1100)  . sodium chloride 250 mL (09/29/15 1200)  . EPINEPHrine 4 mg in dextrose 5% 250 mL infusion (16 mcg/mL) Stopped (09/27/15 1100)  . lactated ringers Stopped (09/26/15 0000)  . lactated ringers Stopped (09/24/15 0700)  . milrinone 0.3 mcg/kg/min (09/29/15 1200)  . norepinephrine (LEVOPHED) Adult infusion Stopped (09/27/15 0600)    PRN Medications: sodium chloride, hydrALAZINE, ondansetron (ZOFRAN) IV, oxyCODONE, sodium chloride   Assessment:  Stable with mild-mod RV dysfunction Postoperative acute on chronic renal failure, creatinine 2.9 The patient remains edematous, will have to be careful of Lasix dosing.   Cont mil 0.3 until renal fx stablilized Increase pump speed to 9000 rpm due to high pulsatility Hold coumadin tonight L thoracentesis for effusion when INR < 2.5  Plan/Discussion:      I reviewed the LVAD parameters from today, and compared the results to the patient's prior recorded data.  No programming changes were made.  The LVAD is functioning within specified parameters.  The patient performs LVAD self-test daily.  LVAD interrogation was negative for any significant power changes, alarms or PI events/speed drops.  LVAD equipment check completed and is in good working order.  Back-up equipment present.   LVAD education done on emergency procedures and precautions and reviewed exit site care.  Length of Stay: [address]  Kathlee Nations Blue III 09/29/2015, 2:33 PM

## 2015-09-29 NOTE — Progress Notes (Signed)
Occupational Therapy Treatment Patient Details Name: Brad [name] MRN: [id number] DOB: 07/17/1941 Today's Date: 09/29/2015    History of present illness 75 y/o male with h/o PAF, previous CVA, HTN, HL, V. 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 Jude CTR-D (initial ICD 2007 with upgrade to CRT in 2015). Pt s/p LVAD placement 12/28.   OT comments  Pt more interactive with OT this date, requires mod A +2 for functional transfers and ambulation and max - total A for ADLs.  Pt very willing to participate today despite being febrile.  VSS   Follow Up Recommendations  CIR;Supervision/Assistance - 24 hour    Equipment Recommendations  3 in 1 bedside comode;Tub/shower seat    Recommendations for Other Services      Precautions / Restrictions Precautions Precautions: Fall;Sternal Precaution Comments: LVAD, 2 chest tubes, PI dropping with activity Restrictions Weight Bearing Restrictions: Yes Other Position/Activity Restrictions: sternal precautions       Mobility Bed Mobility Overal bed mobility: Needs Assistance Bed Mobility: Supine to Sit     Supine to sit: Mod assist;+2 for physical assistance;+2 for safety/equipment     General bed mobility comments: cues for technique/sternal precautions.  Up via R elbov  Transfers Overall transfer level: Needs assistance   Transfers: Sit to/from [employer] Transfers Sit to Stand: Mod assist;+2 physical assistance;+2 safety/equipment Stand pivot transfers: Mod assist;+2 physical assistance       General transfer comment: cues/reinforcement for sternal precautions and both assist to come forward and power up;    Balance Overall balance assessment: Needs assistance Sitting-balance support: Single extremity supported Sitting balance-Leahy Scale: Poor Sitting balance - Comments: less steady today   Standing balance support: Single extremity supported Standing balance-Leahy Scale: Poor Standing balance  comment: reliant on assist                   ADL                           Toilet Transfer: Moderate assistance;+2 for safety/equipment;BSC   Toileting- Clothing Manipulation and Hygiene: Total assistance;Sit to/from stand                Vision                     Perception     Praxis      Cognition   Behavior During Therapy: Flat affect Overall Cognitive Status: Impaired/Different from baseline          Following Commands: Follows one step commands consistently     Problem Solving: Slow processing;Decreased initiation      Extremity/Trunk Assessment               Exercises General Exercises - Lower Extremity Ankle Circles/Pumps: AROM;20 reps;Supine Straight Leg Raises: AAROM;Both;10 reps;Supine Hip Flexion/Marching: AROM;Strengthening;Both;10 reps;Supine (resisted flexion and extension) Other Exercises Other Exercises: isolated bicep and tricep bil x10 reps,   Shoulder Instructions       General Comments      Pertinent Vitals/ Pain       Pain Assessment: Faces Faces Pain Scale: Hurts little more Pain Location: sternal  Pain Descriptors / Indicators: Grimacing Pain Intervention(s): Monitored during session  Home Living  Prior Functioning/Environment              Frequency Min 3X/week     Progress Toward Goals  OT Goals(current goals can now be found in the care plan section)     Acute Rehab OT Goals Patient Stated Goal: did not state  ADL Goals Pt Will Perform Eating: Independently;sitting Pt Will Perform Grooming: standing;with min assist Pt Will Perform Upper Body Bathing: sitting;with supervision Pt Will Perform Lower Body Bathing: with min assist;sit to/from stand Pt Will Perform Upper Body Dressing: with min assist;sitting Pt Will Perform Lower Body Dressing: with min assist;with adaptive equipment;sit to/from stand Pt Will Transfer to  Toilet: with min assist;ambulating;regular height toilet;bedside commode;grab bars Pt Will Perform Toileting - Clothing Manipulation and hygiene: with min assist;sit to/from stand Pt/caregiver will Perform Home Exercise Program: Increased strength;Right Upper extremity;Left upper extremity;With written HEP provided;With Supervision Additional ADL Goal #1: Pt will manage LVAD lines/equipment with min cues   Plan Discharge plan remains appropriate    Co-evaluation    PT/OT/SLP Co-Evaluation/Treatment: Yes Reason for Co-Treatment: Complexity of the patient's impairments (multi-system involvement);For patient/therapist safety PT goals addressed during session: Mobility/safety with mobility OT goals addressed during session: ADL's and self-care      End of Session Equipment Utilized During Treatment: Oxygen   Activity Tolerance Patient limited by fatigue   Patient Left in chair;with call bell/phone within reach;with nursing/sitter in room   Nurse Communication Mobility status        Time: 1610-9604 OT Time Calculation (min): 41 min  Charges: OT General Charges $OT Visit: 1 Procedure OT Treatments $Self Care/Home Management : 8-22 mins  Joanna Borawski M 09/29/2015, 6:43 PM

## 2015-09-29 NOTE — Progress Notes (Signed)
LVAD Coordinator Advanced Heart Failure Rounds:  HM II LVAD Implanted 09/22/2015 as DT by Dr. Donata Clay.   Febrile this morning. Asleep, arouses to verbal stimuli, oriented x 3, denies pain.    Vital signs: HR: 115 A sensed, V paced ICD SJM CRT-D (defib therapy off, pacing on) Auto cuff BP MAP: 90 - 105 O2 Sat: 96%  Wt:142.6 lbs (preop) > 161 > 155 > 150 > 143 > 149 > 142   LVAD interrogation reveals:  Speed: 8990 Flow: 7.1 Power: 6.8w PI:  6.5 Alarms: none Events: 10 PI events daily Fixed speed: 9000 Low speed limit: 8400  Back up controller programmed accordingly and is at the bedside. LVAD is functioning within expected parameters. No programming changes were made this morning.    Labs:  LDH trend: 372 > 303 > 308 > 308 > 285 > 266  INR trend: 1.21 > 1.48 > 1.61 > 2.70 > 2.86 > 3.46  Hgb: 11.6 > 10.4 > 10.1 > 10.6 > 11.0 > 10.6 > 10.1 Co-Ox: 82.7% (on 50% FiO2), SVO2 now reading 65%  Creatinine: 1.6 (preop) > 1.4 > 1.97 > 2.64 > 3.20 > 3.12 > 2.90   Antithrombotic Management: 09/24/15--> 325 mg ASA  09/24/15--> Warfarin started   Infusions: Milrinone 0.3  Epi 3 (off 09/27/15) Levo (off 09/27/15)  OR Blood Products: 4 u FFP 2 u Platelets Cell saver  ICU Blood Products:  09/21/2015--> 2 FFP, 2 pRBC (Hgb 7).  09/24/15 - 2 units pRBC  Ventilator: Extubated 09/25/15  Plan as discussed with team: 1.  VAD coordinator to do dressing change today; assess exit site 2.  Pan cultures 3.  Vanc level tomorrow   Hessie Diener, BSN, RN  VAD Coordinator  Office: [phone] 24/7 VAD Pager: [phone]

## 2015-09-29 NOTE — Progress Notes (Signed)
Physical Therapy Treatment Patient Details Name: Brad [name] MRN: [id number] DOB: June 11, 1941 Today's Date: 09/29/2015    History of Present Illness 75 y/o male with h/o PAF, previous CVA, HTN, HL, V. 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 Jude CTR-D (initial ICD 2007 with upgrade to CRT in 2015). Pt s/p LVAD placement 12/28.    PT Comments    Progressing slower overall, but participative despite fever today.  Quick to fatigue, but able to complete some ambulation.  Unable to start battery change over due to pt not fully focused today.  Follow Up Recommendations  CIR;Supervision/Assistance - 24 hour     Equipment Recommendations  Rolling walker with 5" wheels;3in1 (PT)    Recommendations for Other Services Rehab consult     Precautions / Restrictions Precautions Precautions: Fall;Sternal Restrictions Other Position/Activity Restrictions: sternal precautions    Mobility  Bed Mobility Overal bed mobility: Needs Assistance Bed Mobility: Supine to Sit     Supine to sit: Mod assist;+2 for physical assistance;+2 for safety/equipment     General bed mobility comments: cues for technique/sternal precautions.  Up via R elbov  Transfers Overall transfer level: Needs assistance   Transfers: Sit to/from [employer] Transfers Sit to Stand: Mod assist;+2 physical assistance;+2 safety/equipment Stand pivot transfers: Mod assist;+2 physical assistance       General transfer comment: cues/reinforcement for sternal precautions and both assist to come forward and power up;  Ambulation/Gait Ambulation/Gait assistance: Mod assist;+2 physical assistance Ambulation Distance (Feet): 60 Feet Assistive device: Rolling walker (2 wheeled) Gait Pattern/deviations: Step-through pattern Gait velocity: slower   General Gait Details: unsteady gait with need for trunk stability.  Quickly fatigued today   Radiographer, therapeutic Rankin (Stroke Patients Only)       Balance Overall balance assessment: Needs assistance   Sitting balance-Leahy Scale: Poor Sitting balance - Comments: less steady today   Standing balance support: Single extremity supported;Bilateral upper extremity supported Standing balance-Leahy Scale: Poor Standing balance comment: reliant on assist                    Cognition Arousal/Alertness: Awake/alert Behavior During Therapy: Flat affect Overall Cognitive Status: Within Functional Limits for tasks assessed         Following Commands: Follows one step commands consistently            Exercises General Exercises - Lower Extremity Ankle Circles/Pumps: AROM;20 reps;Supine Straight Leg Raises: AAROM;Both;10 reps;Supine Hip Flexion/Marching: AROM;Strengthening;Both;10 reps;Supine (resisted flexion and extension) Other Exercises Other Exercises: isolated bicep and tricep bil x10 reps,    General Comments General comments (skin integrity, edema, etc.): vitals stable including PI and HR in the upper 110's to upper 120's      Pertinent Vitals/Pain Pain Assessment: Faces Faces Pain Scale: Hurts little more Pain Location: sternal Pain Descriptors / Indicators: Discomfort;Grimacing Pain Intervention(s): Monitored during session    Home Living                      Prior Function            PT Goals (current goals can now be found in the care plan section) Acute Rehab PT Goals Patient Stated Goal: did not state  PT Goal Formulation: With patient Time For Goal Achievement: 09/28/2015 Potential to Achieve Goals: Good Progress towards PT goals: Progressing toward goals  Frequency  Min 3X/week    PT Plan Current plan remains appropriate    Co-evaluation PT/OT/SLP Co-Evaluation/Treatment: Yes Reason for Co-Treatment: Complexity of the patient's impairments (multi-system involvement);For patient/therapist safety PT goals addressed during  session: Mobility/safety with mobility       End of Session Equipment Utilized During Treatment: Oxygen Activity Tolerance: Patient limited by fatigue Patient left: in chair;with call bell/phone within reach;with SCD's reapplied     Time: 1254-1335 PT Time Calculation (min) (ACUTE ONLY): 41 min  Charges:  $Gait Training: 8-22 mins $Therapeutic Activity: 8-22 mins                    G Codes:      Stevana Dufner, [name] 09/29/2015, 5:09 PM 09/29/2015  [place] Bing, PT [phone]  (pager)

## 2015-09-30 ENCOUNTER — Inpatient Hospital Stay (HOSPITAL_COMMUNITY): Payer: Medicare Other

## 2015-09-30 ENCOUNTER — Other Ambulatory Visit: Payer: Self-pay | Admitting: *Deleted

## 2015-09-30 LAB — URINE CULTURE: Culture: NO GROWTH

## 2015-09-30 LAB — COMPREHENSIVE METABOLIC PANEL
ALT: 18 U/L (ref 17–63)
AST: 34 U/L (ref 15–41)
Albumin: 2.3 g/dL — ABNORMAL LOW (ref 3.5–5.0)
Alkaline Phosphatase: 126 U/L (ref 38–126)
Anion gap: 12 (ref 5–15)
BUN: 30 mg/dL — ABNORMAL HIGH (ref 6–20)
CO2: 24 mmol/L (ref 22–32)
Calcium: 8.9 mg/dL (ref 8.9–10.3)
Chloride: 112 mmol/L — ABNORMAL HIGH (ref 101–111)
Creatinine, Ser: 2.61 mg/dL — ABNORMAL HIGH (ref 0.61–1.24)
GFR calc Af Amer: 26 mL/min — ABNORMAL LOW (ref >60)
GFR calc non Af Amer: 23 mL/min — ABNORMAL LOW (ref >60)
Glucose, Bld: 124 mg/dL — ABNORMAL HIGH (ref 65–99)
Potassium: 3.6 mmol/L (ref 3.5–5.1)
Sodium: 148 mmol/L — ABNORMAL HIGH (ref 135–145)
Total Bilirubin: 1.4 mg/dL — ABNORMAL HIGH (ref 0.3–1.2)
Total Protein: 5.8 g/dL — ABNORMAL LOW (ref 6.5–8.1)

## 2015-09-30 LAB — CBC WITH DIFFERENTIAL/PLATELET
Basophils Absolute: 0.1 10*3/uL (ref 0.0–0.1)
Basophils Relative: 1 %
Eosinophils Absolute: 0.4 10*3/uL (ref 0.0–0.7)
Eosinophils Relative: 2 %
HCT: 29.3 % — ABNORMAL LOW (ref 39.0–52.0)
Hemoglobin: 10.5 g/dL — ABNORMAL LOW (ref 13.0–17.0)
Lymphocytes Relative: 9 %
Lymphs Abs: 1.3 10*3/uL (ref 0.7–4.0)
MCH: 30.3 pg (ref 26.0–34.0)
MCHC: 35.8 g/dL (ref 30.0–36.0)
MCV: 84.4 fL (ref 78.0–100.0)
Monocytes Absolute: 1.4 10*3/uL — ABNORMAL HIGH (ref 0.1–1.0)
Monocytes Relative: 10 %
Neutro Abs: 11.8 10*3/uL — ABNORMAL HIGH (ref 1.7–7.7)
Neutrophils Relative %: 78 %
Platelets: 420 10*3/uL — ABNORMAL HIGH (ref 150–400)
RBC: 3.47 MIL/uL — ABNORMAL LOW (ref 4.22–5.81)
RDW: 15.7 % — ABNORMAL HIGH (ref 11.5–15.5)
WBC: 14.9 10*3/uL — ABNORMAL HIGH (ref 4.0–10.5)

## 2015-09-30 LAB — BRAIN NATRIURETIC PEPTIDE: B Natriuretic Peptide: 1669.6 pg/mL — ABNORMAL HIGH (ref 0.0–100.0)

## 2015-09-30 LAB — MAGNESIUM: Magnesium: 2 mg/dL (ref 1.7–2.4)

## 2015-09-30 LAB — BODY FLUID CELL COUNT WITH DIFFERENTIAL
Eos, Fluid: 2 %
Lymphs, Fluid: 9 %
Monocyte-Macrophage-Serous Fluid: 3 % — ABNORMAL LOW (ref 50–90)
Neutrophil Count, Fluid: 86 % — ABNORMAL HIGH (ref 0–25)
Total Nucleated Cell Count, Fluid: 3451 cu mm — ABNORMAL HIGH (ref 0–1000)

## 2015-09-30 LAB — GLUCOSE, CAPILLARY
GLUCOSE-CAPILLARY: 118 mg/dL — AB (ref 65–99)
GLUCOSE-CAPILLARY: 140 mg/dL — AB (ref 65–99)
Glucose-Capillary: 142 mg/dL — ABNORMAL HIGH (ref 65–99)
Glucose-Capillary: 197 mg/dL — ABNORMAL HIGH (ref 65–99)

## 2015-09-30 LAB — LACTATE DEHYDROGENASE: LDH: 300 U/L — ABNORMAL HIGH (ref 98–192)

## 2015-09-30 LAB — CARBOXYHEMOGLOBIN
Carboxyhemoglobin: 2.1 % — ABNORMAL HIGH (ref 0.5–1.5)
Methemoglobin: 1 % (ref 0.0–1.5)
O2 Saturation: 61.5 %
Total hemoglobin: 10.6 g/dL — ABNORMAL LOW (ref 13.5–18.0)

## 2015-09-30 LAB — GRAM STAIN

## 2015-09-30 LAB — VANCOMYCIN, RANDOM: Vancomycin Rm: 22 ug/mL

## 2015-09-30 LAB — PROTIME-INR
INR: 2.56 — ABNORMAL HIGH (ref 0.00–1.49)
Prothrombin Time: 27.1 seconds — ABNORMAL HIGH (ref 11.6–15.2)

## 2015-09-30 MED ORDER — LIDOCAINE HCL (PF) 1 % IJ SOLN
INTRAMUSCULAR | Status: AC
  Filled 2015-09-30: qty 5

## 2015-09-30 MED ORDER — FENTANYL CITRATE (PF) 100 MCG/2ML IJ SOLN
25.0000 ug | Freq: Once | INTRAMUSCULAR | Status: AC
  Administered 2015-09-30: 25 ug via INTRAVENOUS

## 2015-09-30 MED ORDER — GERHARDT'S BUTT CREAM
TOPICAL_CREAM | CUTANEOUS | Status: DC | PRN
  Administered 2015-09-30: 17:00:00 via TOPICAL
  Filled 2015-09-30: qty 1

## 2015-09-30 MED ORDER — MIDAZOLAM HCL 2 MG/2ML IJ SOLN
INTRAMUSCULAR | Status: AC
  Filled 2015-09-30: qty 2

## 2015-09-30 MED ORDER — VANCOMYCIN HCL IN DEXTROSE 750-5 MG/150ML-% IV SOLN
750.0000 mg | INTRAVENOUS | Status: DC
  Administered 2015-09-30 – 2015-10-01 (×2): 750 mg via INTRAVENOUS
  Filled 2015-09-30 (×3): qty 150

## 2015-09-30 MED ORDER — VITAL 1.5 CAL PO LIQD
1000.0000 mL | ORAL | Status: DC
  Administered 2015-10-01 – 2015-10-05 (×5): 1000 mL
  Filled 2015-09-30 (×6): qty 1000

## 2015-09-30 MED ORDER — LIDOCAINE HCL (PF) 1 % IJ SOLN
INTRAMUSCULAR | Status: AC
  Administered 2015-09-30: 6 mL
  Filled 2015-09-30: qty 5

## 2015-09-30 MED ORDER — MIDAZOLAM HCL 2 MG/2ML IJ SOLN
1.0000 mg | Freq: Once | INTRAMUSCULAR | Status: AC
  Administered 2015-09-30: 1 mg via INTRAVENOUS

## 2015-09-30 MED ORDER — FLUCONAZOLE IN SODIUM CHLORIDE 100-0.9 MG/50ML-% IV SOLN
100.0000 mg | INTRAVENOUS | Status: DC
  Administered 2015-09-30: 100 mg via INTRAVENOUS
  Filled 2015-09-30 (×4): qty 50

## 2015-09-30 MED ORDER — FENTANYL CITRATE (PF) 100 MCG/2ML IJ SOLN
INTRAMUSCULAR | Status: AC
  Filled 2015-09-30: qty 2

## 2015-09-30 MED ORDER — FLUCONAZOLE IN SODIUM CHLORIDE 200-0.9 MG/100ML-% IV SOLN
200.0000 mg | INTRAVENOUS | Status: DC
  Filled 2015-09-30: qty 100

## 2015-09-30 MED ORDER — WARFARIN SODIUM 2 MG PO TABS
2.0000 mg | ORAL_TABLET | Freq: Every day | ORAL | Status: DC
  Administered 2015-09-30: 2 mg via ORAL
  Filled 2015-09-30 (×2): qty 1

## 2015-09-30 MED ORDER — FUROSEMIDE 10 MG/ML IJ SOLN
40.0000 mg | Freq: Every day | INTRAMUSCULAR | Status: DC
  Administered 2015-09-30: 40 mg via INTRAVENOUS
  Filled 2015-09-30: qty 4

## 2015-09-30 MED ORDER — VITAL 1.5 CAL PO LIQD
1000.0000 mL | ORAL | Status: DC
  Administered 2015-09-30: 1000 mL
  Filled 2015-09-30 (×2): qty 1000

## 2015-09-30 MED ORDER — VITAL 1.5 CAL PO LIQD
1000.0000 mL | ORAL | Status: DC
  Filled 2015-09-30: qty 1000

## 2015-09-30 MED ORDER — ALBUMIN HUMAN 25 % IV SOLN
12.5000 g | Freq: Four times a day (QID) | INTRAVENOUS | Status: AC
  Administered 2015-09-30 – 2015-10-01 (×4): 12.5 g via INTRAVENOUS
  Filled 2015-09-30 (×4): qty 50

## 2015-09-30 MED ORDER — POTASSIUM CHLORIDE CRYS ER 20 MEQ PO TBCR
20.0000 meq | EXTENDED_RELEASE_TABLET | Freq: Once | ORAL | Status: AC
  Administered 2015-09-30: 20 meq via ORAL
  Filled 2015-09-30: qty 1

## 2015-09-30 NOTE — Progress Notes (Signed)
PT Cancellation Note  Patient Details Name: Brad [name] MRN: [id number] DOB: Feb 12, 1941   Cancelled Treatment:    Reason Eval/Treat Not Completed: Patient not medically ready;Patient declined, no reason specified.  New infection, new chest tube, pt defers therapies today due to not feeling well. 09/30/2015  [name] Bing, PT [phone]  (pager)   [name], [name] 09/30/2015, 10:34 AM

## 2015-09-30 NOTE — Progress Notes (Signed)
Patient ID: Brad [name], male   DOB: 1941-06-10, 75 y.o.   MRN: [id number]    Advanced Heart Failure Rounding Note HeartMate 2 Rounding Note  Subjective:    Admitted from [facility] with cardiogenic shock for advanced heart failure as INTERMACS-1.  Milrinone started 12/21. On 12/22 foley placed by Urology requiring urethral dilation -> UTI . ? Septic shock. Placed on vanco/zosyn. IABP placed 12/23.   S/p HM 2 LVAD placement 08/27/2015. Balloon pump removed 09/07/2015 without hematoma.  Developed fever 09/29/15 with Tmax 102.5. Pan cultures sent. Switched from zosyn to imipinem. Central line replaced.   CVP 6. Remains on Milrinone 0.3, Coox 61.5% this morning. He remains febrile, Tmax 102.5. Only walked 60 feet with PT yesterday, felt weaker with fevers.  Out 1.4L yesterday with dose of IV lasix. Weight down another 3 lbs.  MAPs remain in high 90s low 100s with tachycardia and fever. Hydralazine increased yesterday.  Denies SOB or CP. Had 1 loose stool. C. Diff negative.  Does feel a little dizzy transferring to/from chair.  Creatinine 2.64 => 2.8 => 3.2 => 3.12 => 2.90 => 2.61.  K 3.6 INR 3.46 => 2.56, LDH 285 => 266 => 300  Blood Cx 09/29/15- In process Urine Cx 09/29/15 - In process UA 09/29/15- with few bacteria  C. Diff 09/29/15 - Negative Resp Culture 09/25/16 - Negative.  Driveline change 09/29/15 with small amount of serous drainage, otherwise unremarkable.   LVAD INTERROGATION:  HeartMate II LVAD:  Flow 4.8 liters/min, speed 9000, power 5.0, PI 7.7  Another 16 PI event/24 hrs  Objective:    Vital Signs:   Temp:  [100.1 F (37.8 C)-102.5 F (39.2 C)] 101.1 F (38.4 C) (01/04 0410) Pulse Rate:  [113-122] 122 (01/04 0600) Resp:  [25-37] 35 (01/04 0600) BP: (98-120)/(82-99) 109/92 mmHg (01/04 0600) SpO2:  [92 %-97 %] 97 % (01/04 0600) Weight:  [139 lb 5.3 oz (63.2 kg)] 139 lb 5.3 oz (63.2 kg) (01/04 0500) Last BM Date: 09/29/15 Mean arterial Pressure  90-100s  Intake/Output:   Intake/Output Summary (Last 24 hours) at 09/30/15 0708 Last data filed at 09/30/15 0600  Gross per 24 hour  Intake 1668.9 ml  Output   2825 ml  Net -1156.1 ml     Physical Exam: General: NAD, Elderly appearing. Weak. Very warm to touch.  HEENT: normal Neck: supple.  JVP 6-7 cm.  Carotids 2+ bilat; no bruits. No thyromegaly or nodule noted.  Cor: PMI nondisplaced. RRR. LVAD hum present. Lungs: Decreased bases bilaterally. L>R. Mildly tachypneic. Abdomen: soft, NT, ND, no HSM. No bruits or masses. +BS  Extremities: no cyanosis, clubbing, rash. RUE PICC, LUE radial arterial line. 1-2+ edema into thighs. SCDs present Neuro: alert & orientedx3, cranial nerves grossly intact. moves all 4 extremities w/o difficulty. Affect pleasant  Telemetry: reviewed personally, a sensed v pacing in 110-120s, occasional PVCs   Labs: Basic Metabolic Panel:  Recent Labs Lab 09/24/15 0450  09/25/15 0340  09/26/15 0448 09/26/15 1634 09/27/15 0345 09/28/15 0425 09/29/15 0515 09/30/15 0500  NA 147*  < > 146*  < > 146* 142 144 146* 145 148*  K 4.0  < > 3.7  < > 4.1 3.6 3.6 3.5 3.4* 3.6  CL 117*  < > 114*  < > 111 110 109 113* 111 112*  CO2 20*  --  23  --  22  --  23 22 25 24   GLUCOSE 131*  < > 125*  < > 127* 189* 129* 102* 125*  124*  BUN 13  < > 16  < > 20 28* 31* 37* 35* 30*  CREATININE 1.40*  < > 1.97*  < > 2.64* 2.80* 3.20* 3.12* 2.90* 2.61*  CALCIUM 8.3*  --  9.0  --  8.8*  --  8.8* 8.9 8.9 8.9  MG 2.4  < > 2.5*  --  2.3  --  2.5* 2.4 2.2 2.0  PHOS 3.0  --  4.2  --  4.4  --   --   --   --   --   < > = values in this interval not displayed.  Liver Function Tests:  Recent Labs Lab 09/26/15 0448 09/27/15 0345 09/28/15 0425 09/29/15 0515 09/30/15 0500  AST 46* 36 29 26 34  ALT 23 21 17  16* 18  ALKPHOS 87 103 102 115 126  BILITOT 1.0 1.1 1.0 1.2 1.4*  PROT 5.6* 5.3* 5.4* 5.6* 5.8*  ALBUMIN 2.6* 2.2* 2.5* 2.5* 2.3*   No results for input(s): LIPASE,  AMYLASE in the last 168 hours. No results for input(s): AMMONIA in the last 168 hours.  CBC:  Recent Labs Lab 09/26/15 0448 09/26/15 1634 09/27/15 0345 09/28/15 0425 09/29/15 0515 09/30/15 0500  WBC 15.5*  --  16.2* 12.5* 8.8 14.9*  NEUTROABS 12.5*  --  12.9* 9.6* 6.6 11.8*  HGB 10.6* 12.2* 11.0* 10.6* 10.1* 10.5*  HCT 29.8* 36.0* 30.1* 30.6* 28.8* 29.3*  MCV 82.5  --  83.1 82.3 82.5 84.4  PLT 255  --  291 311 323 420*    INR:  Recent Labs Lab 09/26/15 0448 09/27/15 0345 09/28/15 0425 09/29/15 0515 09/30/15 0500  INR 1.61* 2.70* 2.86* 3.46* 2.56*    Other results:  EKG:   Imaging: Dg Chest 2 View  09/29/2015  CLINICAL DATA:  Acute on chronic CHF with left ventricular assist device; cardiogenic shock, acute renal injury. EXAM: CHEST  2 VIEW COMPARISON:  Portable chest x-ray of September 28, 2015 FINDINGS: The right lung is well-expanded. The right upper lobe is clear today. There is a small right pleural effusion. Density at the left lung base obscures the costophrenic angle and opacifies the retrocardiac region. The left upper lobe is clearer today. The cardiac silhouette remains enlarged. The left ventricular assist device is in stable position radiographically. The implantable pacemaker defibrillator is unchanged in appearance. There are 7 intact sternal wires. A right internal jugular venous catheter tip projects over the midportion of the SVC. IMPRESSION: 1. Improved appearance of the upper lobes bilaterally consistent with resolving interstitial edema. 2. Persistent left lower lobe atelectasis or pneumonia. Persistent bilateral pleural effusions. 3. Stable cardiomegaly. Stable support tube appearance radiographically. Electronically Signed   By: David  [place] M.D.   On: 09/29/2015 07:44   Dg Chest Port 1 View  09/29/2015  CLINICAL DATA:  Recent left ventricular assist device placement. Central catheter placement EXAM: PORTABLE CHEST 1 VIEW COMPARISON:  Study obtained earlier  in the day FINDINGS: There is a new right central catheter with tip in the superior vena cava. A second central catheter also has its tip in the superior vena cava. There is a left ventricular assist device as well as a defibrillator with leads attached to the right atrium, right ventricle, and left ventricle, stable. Temporary pacemaker wires are also attached to the right heart. There is no demonstrable pneumothorax. There is a sizable left pleural effusion with a smaller right pleural effusion. There is airspace consolidation left lower lobe, stable. There is no new opacity. Heart  is enlarged with mild pulmonary venous hypertension. IMPRESSION: Catheter positions as described without pneumothorax. Bilateral pleural effusions, larger on the left than on the right. Stable airspace consolidation left lower lobe. Stable cardiomegaly. No new opacity apparent. Electronically Signed   By: Bretta Bang III M.D.   On: 09/29/2015 09:08     Medications:     Scheduled Medications: . amiodarone  200 mg Oral Daily  . aspirin  81 mg Oral Daily  . docusate sodium  200 mg Oral Daily  . feeding supplement (ENSURE ENLIVE)  237 mL Oral TID BM  . hydrALAZINE  25 mg Oral 3 times per day  . imipenem-cilastatin  250 mg Intravenous Q8H  . insulin aspart  0-5 Units Subcutaneous QHS  . insulin aspart  0-9 Units Subcutaneous TID WC  . pantoprazole  40 mg Oral Daily  . sildenafil  40 mg Oral TID  . sodium chloride  10 mL Intravenous Q12H  . Warfarin - Physician Dosing Inpatient   Does not apply q1800    Infusions: . sodium chloride Stopped (09/25/15 1100)  . sodium chloride 250 mL (09/30/15 0400)  . EPINEPHrine 4 mg in dextrose 5% 250 mL infusion (16 mcg/mL) Stopped (09/27/15 1100)  . lactated ringers Stopped (09/26/15 0000)  . lactated ringers Stopped (09/24/15 0700)  . milrinone 0.3 mcg/kg/min (09/30/15 0400)  . norepinephrine (LEVOPHED) Adult infusion Stopped (09/27/15 0600)    PRN Medications: sodium  chloride, hydrALAZINE, ondansetron (ZOFRAN) IV, oxyCODONE, sodium chloride   Assessment/Plan   1. Cardiogenic shock: NICM with EF 5%, mild to moderate RV hypokinesis.  S/p St Jude CRT-D.  IABP placed 12/23 - 09/14/2015.  Now s/p HM II LVAD placement 08/29/2015. Co-ox 65.4%. Creatinine trending down.  - Out ~1.4 L. He still has some volume on board with peripheral edema. Got 40 mg IV lasix yesterday.  Will hold off this am with loose stool and fevers.   - Off epinephrine and norepinephrine since 09/27/15  Continue milrinone at 0.3 for now with Coox 61.5%. - Continue hydralazine 25 mg tid 2. Atrial fibrillation: Paroxysmal.    - EKG 09/27/14 showed a-sensed V-paced at 111 bpm with occasional PVCs.  On coumadin and po amiodarone, INR trending down. Physician dosing.  3. 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   - 3-5 beat NSVT runs on tele.  K 3.6. Will repeat 20 meq supp today. 4. AKI on CKD: Creatinine 1.9 prior to admission.  3.2 => 3.12 => 2.90 => 2.6 - Got 40 mg IV lasix yesterday. Creatinine continues to trend down. - Will hold off on lasix with loose stools and fever. 5. Pulmonary HTN:   - Nitric Oxide d/c'd 09/26/15 - Continue sildenafil 40 tid. 6. Anticoagulation:  - INR 2.86 => 3.46 => 2.56. - On ASA 81 mg daily.   7. ID:  - Remains on vanc. To get trough level today. WBCs jumped from 8.8 to 14.9. - Remains febrile with Tmax 102.5 and Temp 101.1 this am. - Zosyn d/c'd 09/29/15 and switched to Imipenem. Will add fluconazole. Follow cultures. For thoracentesis today - CXR 09/29/15 with Bilateral pleural effusion L> R. To get L thoracentesis once INR < 2.5. - If fever does not turn around may need to get ID involved.  8.Conditioning - Working PT. Walked less yesterday with fever. Recommend CIR.   I reviewed the LVAD parameters from today, and compared the results to the patient's prior recorded data.  No programming changes were made.  The LVAD is functioning within specified parameters. LVAD  interrogation was  negative for any significant power changes, alarms or PI events/speed drops.  LVAD equipment check completed and is in good working order.  Back-up equipment present.   Length of Stay: [id number]  Graciella Freer PA-C 09/30/2015, 7:08 AM  VAD Team --- VAD ISSUES ONLY--- Pager [phone] (7am - 7am)  Patient seen and examined with Otilio Saber, PA-C. We discussed all aspects of the encounter. I agree with the assessment and plan as stated above.   He is worse today. Very tenuous. Persistent fevers despite broadening abx. WBC up. CX still pending. UA ok. Will add fluconazole. Plan for thoracentesis today. May need ID to see.   Remains volume overloaded but sodium trending up. Will continue diuresis but may need some albumin.   INR ok.   Remains in NSR on amio.   VAD parameters stable.   The patient is critically ill with multiple organ systems failure and requires high complexity decision making for assessment and support, frequent evaluation and titration of therapies, application of advanced monitoring technologies and extensive interpretation of multiple databases.   Critical Care Time devoted to patient care services described in this note is 45 Minutes.  Deray Dawes,MD 4:22 PM

## 2015-09-30 NOTE — Progress Notes (Signed)
LVAD Coordinator Advanced Heart Failure Rounds:  HM II LVAD Implanted 08/28/2015 as DT by Dr. Donata Clay.   Febrile this morning. Asleep, arouses to verbal stimuli, oriented x 3, denies pain.    Vital signs: HR: 122  A sensed, V paced ICD SJM CRT-D (defib therapy off, pacing on) Auto cuff BP MAP: 90 - 100 O2 Sat: 96 - 93%  Wt:142.6 lbs (preop) > 161 > 155 > 150 > 143 > 149 > 142 > 139   LVAD interrogation reveals:  Speed: 8990 Flow: 7.3 Power: 5.4w PI:  6.6 Alarms: none Events: 10 - 15 PI events daily Fixed speed: 9000 Low speed limit: 8400  Back up controller programmed accordingly and is at the bedside. LVAD is functioning within expected parameters. No programming changes were made this morning.    Labs:  LDH trend: 372 > 303 > 308 > 308 > 285 > 266 > 300  INR trend: 1.21 > 1.48 > 1.61 > 2.70 > 2.86 > 3.46 > 2.56  Hgb: 11.6 > 10.4 > 10.1 > 10.6 > 11.0 > 10.6 > 10.1>10.5  WBC:  8.9 > 10.7 > 12.3 > 15.5 > 16.2 > 14.6  Co-Ox: 61.5  Creatinine: 1.6 (preop) > 1.4 > 1.97 > 2.64 > 3.20 > 3.12 > 2.90 > 2.61   Antithrombotic Management: 09/24/15--> 325 mg ASA  09/24/15--> Warfarin started   Infusions: Milrinone 0.3  Epi 3 (off 09/27/15) Levo (off 09/27/15)  OR Blood Products: 4 u FFP 2 u Platelets Cell saver  ICU Blood Products:  09/17/2015--> 2 FFP, 2 pRBC (Hgb 7).  09/24/15 - 2 units pRBC  Ventilator: Extubated 09/25/15  Plan as discussed with team: 1.  Left CT for increasing pleural effusion  2.  Add fluconazole for antifungal coverage    Hessie Diener, BSN, RN  VAD Coordinator  Office: [phone] 24/7 VAD Pager: [phone]

## 2015-09-30 NOTE — Progress Notes (Signed)
Nutrition Follow-up  DOCUMENTATION CODES:   Not applicable  INTERVENTION:    Nocturnal TF regimen -- Vital 1.5 formula at 60 ml/hr.  Run from 7 PM to 7 AM.  Total time is 12 hours.  TF regimen to provide 1080 kcals, 49 gm protein, 550 ml of free water  NUTRITION DIAGNOSIS:   Increased nutrient needs related to chronic illness as evidenced by estimated needs, ongoing  GOAL:   Patient will meet greater than or equal to 90% of their needs, progressing  MONITOR:   TF tolerance, PO intake, Labs, Weight trends, I & O's  ASSESSMENT:   75 y/o Male with h/o PAF, previous CVA, HTN, HL, V. 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 Jude CTR-D (initial ICD 2007 with upgrade to CRT in 2015) referred by Dr. Bernarda Caffey for further management of cardiogenic shock and consideration of advanced therapies.   Patient s/p procedure 12/28: INSERTION OF IMPLANTABLE LEFT VENTRICULAR ASSIST DEVICE   Patient continues on a Heart Healthy/Carbohydrate Modified diet.  Per RN, not eating much.  Per flowsheet records, PO intake variable at 0-60%.  CORTRAK small bore feeding tube placed today -- tip in duodenal bulb region.  Vital 1.5 formula currently infusing at 10 ml/hr.  Goal rate is 40 ml/hr which provides 1440 kcals, 65 gm protein, 733 ml of free water.  Spoke with Dr. Donata Clay regarding nocturnal TF regimen.  Telephone with readback order received.  Diet Order:  Diet heart healthy/carb modified Room service appropriate?: Yes; Fluid consistency:: Thin  Skin:  Reviewed, no issues  Last BM:  1/3  Height:   Ht Readings from Last 1 Encounters:  09/13/2015 5\' 7"  (1.702 m)    Weight:   Wt Readings from Last 1 Encounters:  09/30/15 139 lb 5.3 oz (63.2 kg)    Ideal Body Weight:  67 kg  BMI:  Body mass index is 21.82 kg/(m^2).  Estimated Nutritional Needs:   Kcal:  1900-2100  Protein:  100-110 gm  Fluid:  per MD  EDUCATION NEEDS:   No education needs identified  at this time  Maureen Chatters, RD, LDN Pager #: [phone] After-Hours Pager #: [phone]

## 2015-09-30 NOTE — CV Procedure (Signed)
CT SURGERY  Left chest tube  ( 28 F) placed for postop L pleural effusion- 1.5 Lm serosanguinous fluid. Fluid sent for culture.  CXR pending  P Donata Clay MD

## 2015-09-30 NOTE — Progress Notes (Signed)
CSW met with patient and wife at bedside. Patient was very sleepy as he had placement of chest tube this morning. Wife stated that patient has not been feeling well and along with the chest tube he will have tube feeding placed later today. Wife states she has been driving back and forth (3 hours one way) each day to visit but will stay in [place] over the weekend. Wife states "when he feels better than I will feel better". Wife noted that she is not sleeping well because she is worried about patient. Wife appears to be coping and has support from family during this stressful time. CSW provided supportive intervention and will continue to follow throughout recovery. Raquel Sarna, [place]

## 2015-10-01 ENCOUNTER — Inpatient Hospital Stay (HOSPITAL_COMMUNITY): Payer: Medicare Other

## 2015-10-01 DIAGNOSIS — R5082 Postprocedural fever: Secondary | ICD-10-CM

## 2015-10-01 DIAGNOSIS — Z95810 Presence of automatic (implantable) cardiac defibrillator: Secondary | ICD-10-CM | POA: Diagnosis present

## 2015-10-01 DIAGNOSIS — B49 Unspecified mycosis: Secondary | ICD-10-CM

## 2015-10-01 LAB — GLUCOSE, CAPILLARY
GLUCOSE-CAPILLARY: 110 mg/dL — AB (ref 65–99)
Glucose-Capillary: 150 mg/dL — ABNORMAL HIGH (ref 65–99)
Glucose-Capillary: 142 mg/dL — ABNORMAL HIGH (ref 65–99)
Glucose-Capillary: 161 mg/dL — ABNORMAL HIGH (ref 65–99)

## 2015-10-01 LAB — CBC
HEMATOCRIT: 22.2 % — AB (ref 39.0–52.0)
HEMOGLOBIN: 7.7 g/dL — AB (ref 13.0–17.0)
MCH: 28.6 pg (ref 26.0–34.0)
MCHC: 34.7 g/dL (ref 30.0–36.0)
MCV: 82.5 fL (ref 78.0–100.0)
Platelets: 408 10*3/uL — ABNORMAL HIGH (ref 150–400)
RBC: 2.69 MIL/uL — ABNORMAL LOW (ref 4.22–5.81)
RDW: 15.7 % — ABNORMAL HIGH (ref 11.5–15.5)
WBC: 14.8 10*3/uL — AB (ref 4.0–10.5)

## 2015-10-01 LAB — COMPREHENSIVE METABOLIC PANEL
ALT: 26 U/L (ref 17–63)
AST: 91 U/L — ABNORMAL HIGH (ref 15–41)
Albumin: 2.9 g/dL — ABNORMAL LOW (ref 3.5–5.0)
Alkaline Phosphatase: 117 U/L (ref 38–126)
Anion gap: 8 (ref 5–15)
BUN: 34 mg/dL — ABNORMAL HIGH (ref 6–20)
CO2: 24 mmol/L (ref 22–32)
Calcium: 9 mg/dL (ref 8.9–10.3)
Chloride: 116 mmol/L — ABNORMAL HIGH (ref 101–111)
Creatinine, Ser: 2.38 mg/dL — ABNORMAL HIGH (ref 0.61–1.24)
GFR calc Af Amer: 29 mL/min — ABNORMAL LOW (ref >60)
GFR calc non Af Amer: 25 mL/min — ABNORMAL LOW (ref >60)
Glucose, Bld: 201 mg/dL — ABNORMAL HIGH (ref 65–99)
Potassium: 3.1 mmol/L — ABNORMAL LOW (ref 3.5–5.1)
Sodium: 148 mmol/L — ABNORMAL HIGH (ref 135–145)
Total Bilirubin: 1.1 mg/dL (ref 0.3–1.2)
Total Protein: 5.7 g/dL — ABNORMAL LOW (ref 6.5–8.1)

## 2015-10-01 LAB — POCT I-STAT, CHEM 8
BUN: 29 mg/dL — ABNORMAL HIGH (ref 6–20)
CALCIUM ION: 1.14 mmol/L (ref 1.13–1.30)
CREATININE: 1.9 mg/dL — AB (ref 0.61–1.24)
Chloride: 118 mmol/L — ABNORMAL HIGH (ref 101–111)
Glucose, Bld: 144 mg/dL — ABNORMAL HIGH (ref 65–99)
HEMATOCRIT: 33 % — AB (ref 39.0–52.0)
HEMOGLOBIN: 11.2 g/dL — AB (ref 13.0–17.0)
Potassium: 3.1 mmol/L — ABNORMAL LOW (ref 3.5–5.1)
SODIUM: 149 mmol/L — AB (ref 135–145)
TCO2: 21 mmol/L (ref 0–100)

## 2015-10-01 LAB — CARBOXYHEMOGLOBIN
Carboxyhemoglobin: 2.1 % — ABNORMAL HIGH (ref 0.5–1.5)
Methemoglobin: 1 % (ref 0.0–1.5)
O2 Saturation: 64.1 %
Total hemoglobin: 8.2 g/dL — ABNORMAL LOW (ref 13.5–18.0)

## 2015-10-01 LAB — PROTIME-INR
INR: 2.8 — ABNORMAL HIGH (ref 0.00–1.49)
Prothrombin Time: 29.1 seconds — ABNORMAL HIGH (ref 11.6–15.2)

## 2015-10-01 LAB — PATHOLOGIST SMEAR REVIEW

## 2015-10-01 LAB — HEMOGLOBIN AND HEMATOCRIT, BLOOD
HCT: 30.5 % — ABNORMAL LOW (ref 39.0–52.0)
Hemoglobin: 10.7 g/dL — ABNORMAL LOW (ref 13.0–17.0)

## 2015-10-01 LAB — LACTATE DEHYDROGENASE: LDH: 353 U/L — ABNORMAL HIGH (ref 98–192)

## 2015-10-01 LAB — PREPARE RBC (CROSSMATCH)

## 2015-10-01 MED ORDER — WARFARIN SODIUM 1 MG PO TABS
1.0000 mg | ORAL_TABLET | Freq: Every day | ORAL | Status: DC
  Filled 2015-10-01 (×2): qty 1

## 2015-10-01 MED ORDER — POTASSIUM CHLORIDE 10 MEQ/50ML IV SOLN
10.0000 meq | INTRAVENOUS | Status: AC
  Administered 2015-10-01 (×2): 10 meq via INTRAVENOUS
  Filled 2015-10-01 (×3): qty 50

## 2015-10-01 MED ORDER — METOCLOPRAMIDE HCL 5 MG/ML IJ SOLN
5.0000 mg | Freq: Three times a day (TID) | INTRAMUSCULAR | Status: DC
  Administered 2015-10-01 – 2015-10-03 (×6): 5 mg via INTRAVENOUS
  Filled 2015-10-01 (×6): qty 2

## 2015-10-01 MED ORDER — SODIUM CHLORIDE 0.9 % IV SOLN
200.0000 mg | Freq: Once | INTRAVENOUS | Status: AC
  Administered 2015-10-01: 200 mg via INTRAVENOUS
  Filled 2015-10-01: qty 200

## 2015-10-01 MED ORDER — SODIUM CHLORIDE 0.9 % IV SOLN
Freq: Once | INTRAVENOUS | Status: AC
  Administered 2015-10-01: 10 mL via INTRAVENOUS

## 2015-10-01 MED ORDER — POTASSIUM CHLORIDE CRYS ER 20 MEQ PO TBCR
40.0000 meq | EXTENDED_RELEASE_TABLET | Freq: Once | ORAL | Status: AC
  Administered 2015-10-01: 40 meq via ORAL
  Filled 2015-10-01: qty 2

## 2015-10-01 MED ORDER — SODIUM CHLORIDE 0.9 % IV SOLN
100.0000 mg | INTRAVENOUS | Status: DC
  Administered 2015-10-02 – 2015-10-15 (×14): 100 mg via INTRAVENOUS
  Filled 2015-10-01 (×16): qty 100

## 2015-10-01 MED ORDER — MILRINONE IN DEXTROSE 20 MG/100ML IV SOLN
0.3000 ug/kg/min | INTRAVENOUS | Status: DC
  Administered 2015-10-02: 0.3 ug/kg/min via INTRAVENOUS
  Filled 2015-10-01: qty 100

## 2015-10-01 MED ORDER — POTASSIUM CHLORIDE 10 MEQ/50ML IV SOLN
10.0000 meq | INTRAVENOUS | Status: AC
  Administered 2015-10-01 (×3): 10 meq via INTRAVENOUS
  Filled 2015-10-01: qty 50

## 2015-10-01 MED ORDER — FUROSEMIDE 10 MG/ML IJ SOLN
40.0000 mg | Freq: Two times a day (BID) | INTRAMUSCULAR | Status: DC
  Administered 2015-10-01 – 2015-10-02 (×3): 40 mg via INTRAVENOUS
  Filled 2015-10-01 (×3): qty 4

## 2015-10-01 MED ORDER — FLUCONAZOLE IN SODIUM CHLORIDE 200-0.9 MG/100ML-% IV SOLN
200.0000 mg | INTRAVENOUS | Status: DC
  Filled 2015-10-01: qty 100

## 2015-10-01 NOTE — Progress Notes (Signed)
Occupational Therapy Treatment Patient Details Name: Hendryx Snidow MRN: [id number] DOB: 1941-03-19 Today's Date: 10/01/2015    History of present illness 75 y/o male with h/o PAF, previous CVA, HTN, HL, V. 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 Jude CTR-D (initial ICD 2007 with upgrade to CRT in 2015). Pt s/p LVAD placement 12/28.   OT comments  Pt very limited activity today due to feeling poorly.  PI dropped from 5.6 - 3.4 with standing, so pt just pivoted to chair, with PI slowly climbing back up to 5.6 after 3-4 mins.  Pt denied dizziness.  He required max A +2 today.   Will continue to follow.   Follow Up Recommendations  CIR;Supervision/Assistance - 24 hour    Equipment Recommendations  3 in 1 bedside comode;Tub/shower seat    Recommendations for Other Services      Precautions / Restrictions Precautions Precautions: Fall;Sternal Precaution Comments: LVAD, 2 chest tubes, PI dropping with activity Restrictions Other Position/Activity Restrictions: sternal precautions        Mobility Bed Mobility Overal bed mobility: Needs Assistance Bed Mobility: Supine to Sit     Supine to sit: Max assist;+2 for physical assistance     General bed mobility comments: requires assist for all aspects.  Pt initiating very little   Transfers Overall transfer level: Needs assistance Equipment used: Rolling walker (2 wheeled) Transfers: Sit to/from [employer] Sit to Stand: Max assist;+2 physical assistance Stand pivot transfers: Mod assist;+2 physical assistance       General transfer comment: Pt required significant assist to power up into standing this date.  PI starting 5.6 with drop to 3.4 while standing.  Pt denies dizziness.  Once pt seated, PI slowly increased back to 5.6 after ~3-4 mins  RN aware     Balance Overall balance assessment: Needs assistance Sitting-balance support: Feet supported Sitting balance-Leahy Scale:  Poor Sitting balance - Comments: required min A EOB    Standing balance support: Bilateral upper extremity supported Standing balance-Leahy Scale: Poor                     ADL                           Toilet Transfer: +2 for physical assistance;Stand-pivot;BSC;RW;Maximal assistance                    Vision                     Perception     Praxis      Cognition   Behavior During Therapy: Flat affect                  Problem Solving: Slow processing;Decreased initiation      Extremity/Trunk Assessment               Exercises     Shoulder Instructions       General Comments      Pertinent Vitals/ Pain       Pain Assessment: Faces Faces Pain Scale: Hurts little more Pain Location: sternal  Pain Descriptors / Indicators: Grimacing Pain Intervention(s): Monitored during session  Home Living  Prior Functioning/Environment              Frequency Min 3X/week     Progress Toward Goals  OT Goals(current goals can now be found in the care plan section)  Progress towards OT goals: Not progressing toward goals - comment (PI event )  ADL Goals Pt Will Perform Eating: Independently;sitting Pt Will Perform Grooming: standing;with min assist Pt Will Perform Upper Body Bathing: sitting;with supervision Pt Will Perform Lower Body Bathing: with min assist;sit to/from stand Pt Will Perform Upper Body Dressing: with min assist;sitting Pt Will Perform Lower Body Dressing: with min assist;with adaptive equipment;sit to/from stand Pt Will Transfer to Toilet: with min assist;ambulating;regular height toilet;bedside commode;grab bars Pt Will Perform Toileting - Clothing Manipulation and hygiene: with min assist;sit to/from stand Pt/caregiver will Perform Home Exercise Program: Increased strength;Right Upper extremity;Left upper extremity;With written HEP  provided;With Supervision Additional ADL Goal #1: Pt will manage LVAD lines/equipment with min cues   Plan Discharge plan remains appropriate    Co-evaluation    PT/OT/SLP Co-Evaluation/Treatment: Yes Reason for Co-Treatment: Complexity of the patient's impairments (multi-system involvement);For patient/therapist safety   OT goals addressed during session: ADL's and self-care      End of Session Equipment Utilized During Treatment: Oxygen   Activity Tolerance Patient limited by fatigue;Patient limited by lethargy;Other (comment) (medical issues )   Patient Left in chair;with call bell/phone within reach;with family/visitor present   Nurse Communication Mobility status        Time: 8295-6213 OT Time Calculation (min): 23 min  Charges: OT General Charges $OT Visit: 1 Procedure OT Treatments $Therapeutic Activity: 8-22 mins  Jennice Renegar M 10/01/2015, 1:27 PM

## 2015-10-01 NOTE — Progress Notes (Signed)
CRITICAL VALUE ALERT  Critical value received:  Blood culture from L hand positive for yeast   Date of notification:  10/01/2015  Time of notification:  0730 Critical value read back: yes  Nurse who received alert:  Aline August RN  MD notified (1st page):    Time of first page:    MD notified (2nd page):  Time of second page:  Responding MD:  Donata Clay  Time MD responded:

## 2015-10-01 NOTE — Progress Notes (Signed)
Speech Language Pathology Treatment: Dysphagia  Patient Details Name: Brad [name] MRN: [id number] DOB: 29-May-1941 Today's Date: 10/01/2015 Time: 1941-7408 SLP Time Calculation (min) (ACUTE ONLY): 12 min  Assessment / Plan / Recommendation Clinical Impression  Pt seen for f/u check of tolerance of diet. RN reports pt did have some coughing when taking pills with ensure in am today and that pt is generally weaker. SLP observed pt with trials of regular solids, thin liquids and puree with overall normal swallow function and no signs of aspiration. Suggested to pt and RN to try pills whole in puree as mixed consistencies can sometimes be a challenge to pts. Given no overt deficits, will sign off, education complete.    HPI HPI: 75 y/o male with h/o PAF, previous CVA, HTN, HL, V. 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 Jude CTR-D (initial ICD 2007 with upgrade to CRT in 2015). Pt s/p LVAD placement 12/28.      SLP Plan  Discharge SLP treatment due to (comment);All goals met     Recommendations  Diet recommendations: Thin liquid;Regular Liquids provided via: Cup;Straw Medication Administration: Whole meds with liquid Supervision: Patient able to self feed;Intermittent supervision to cue for compensatory strategies Compensations: Slow rate;Small sips/bites Postural Changes and/or Swallow Maneuvers: Seated upright 90 degrees              Plan: Discharge SLP treatment due to (comment);All goals met  Herbie [place], [place] CCC-SLP [phone]  Lynann Beaver 10/01/2015, 2:46 PM

## 2015-10-01 NOTE — Progress Notes (Signed)
CSW met at bedside with patient and wife. Patient states he continues to feel weak and unable to complete PT exercises today. Patient states he has an infection and new medication was started today. He is hopeful to feel improvement soon and positive thoughts of recovery. Patient's wife continues to commute 3 hour drive to and from hospital to visit patient. She states "I don't want him to be alone" and shared concerns about recent infection and progress with recovery. She states "I just want him to feel better". Patient encouraged wife to remain home this weekend with the winter weather arriving on Friday night. CSW provided supportive intervention and encouraged wife that patient well cared for now and to take advantage of time to care for self. Patient and wife appear stressed with slow recovery and recent infection. CSW provided support and will continue to follow throughout recovery. Raquel Sarna, [place]

## 2015-10-01 NOTE — Consult Note (Signed)
[facility]    Date of Admission:  09/09/2015   Total days of antibiotics 15        Day 15 vancomycin        Day 3 imipenem        Day 1 anidulafungin       Reason for Consult: Fungemia    Referring Physician: Dr. Nicholes Mango  Principal Problem:   Fungemia Active Problems:   Acute on chronic systolic and diastolic heart failure, NYHA class 4 (HCC)   LVAD (left ventricular assist device) present (HCC)   Automatic implantable cardioverter-defibrillator in situ   Cardiogenic shock (HCC)   Acute renal failure superimposed on stage 3 chronic kidney disease (HCC)   PAF (paroxysmal atrial fibrillation) (HCC)   PAH (pulmonary artery hypertension) (HCC)   Dyspnea   Hypotension   Palliative care encounter   CHF (congestive heart failure) (HCC)   AKI (acute kidney injury) (HCC)   . sodium chloride   Intravenous Once  . amiodarone  200 mg Oral Daily  . [START ON 10/02/2015] anidulafungin  100 mg Intravenous Q24H  . anidulafungin  200 mg Intravenous Once  . aspirin  81 mg Oral Daily  . docusate sodium  200 mg Oral Daily  . feeding supplement (ENSURE ENLIVE)  237 mL Oral TID BM  . feeding supplement (VITAL 1.5 CAL)  1,000 mL Per Tube Q24H  . furosemide  40 mg Intravenous Q12H  . hydrALAZINE  25 mg Oral 3 times per day  . imipenem-cilastatin  250 mg Intravenous Q8H  . insulin aspart  0-5 Units Subcutaneous QHS  . insulin aspart  0-9 Units Subcutaneous TID WC  . metoCLOPramide (REGLAN) injection  5 mg Intravenous 3 times per day  . pantoprazole  40 mg Oral Daily  . sildenafil  40 mg Oral TID  . sodium chloride  10 mL Intravenous Q12H  . vancomycin  750 mg Intravenous Q24H  . warfarin  1 mg Oral q1800  . Warfarin - Physician Dosing Inpatient   Does not apply q1800    Recommendations: 1. Repeat blood cultures 2. Change fluconazole to anidulafungin 3. Await final identification and susceptibilities of the initial yeast isolate 4. Continue  vancomycin and imipenem pending further evaluation and final blood cultures 5. Recommend replacing central line once cultures are negative   Assessment: Brad [name] had early onset postoperative fevers following recent LVAD placement and has fungemia. I have repeated blood cultures today since the duration of fungemia helps predict the likelihood of complications. I also changed fluconazole to anidulafungin to get more reliable coverage for some azole antifungal resistant strains of Candida pending final identification of his yeast and susceptibility testing. He should not need broader antibacterial therapy much longer but I will continue vancomycin and imipenem now pending further evaluation overnight and final blood culture results. Since his current central line was placed while he was fungemic it will need to be replaced but I would wait until we know that repeat blood cultures are negative. There may be some utility to obtaining a TEE but I would only do that if it is felt that he could safely tolerate it and that it would alter his management or outcome. We will follow with you.   HPI: Brad [name] is a 75 y.o. male with nonischemic cardiomyopathy and chronic systolic heart failure. He was transferred here from [facility] in [place], [place] on 09/23/2015 with severe heart failure  and hypotension. A PICC was placed and he was started on IV milrinone and sildenafil. He had acute urinary retention at that time and had a Foley catheter placed. Because of persistent severe hypotension and intra-aortic balloon pump was placed the following day. He was also started on empiric vancomycin and piperacillin tazobactam for possible sepsis. His blood cultures at that time were negative. He underwent LVAD placement on 10-21-15. He developed postoperative fever on 09/29/2015. Blood cultures were obtained and piperacillin tazobactam was changed to imipenem. One peripheral stick blood culture is now  growing yeast. He was started on fluconazole last night.   Review of Systems: Review of Systems  Constitutional: Positive for fever and malaise/fatigue. Negative for chills, weight loss and diaphoresis.  HENT: Negative for sore throat.   Respiratory: Positive for cough and shortness of breath. Negative for sputum production.   Cardiovascular: Negative for chest pain.  Gastrointestinal: Negative for nausea, vomiting and diarrhea.  Genitourinary: Negative for dysuria.  Musculoskeletal: Negative for joint pain.  Skin: Negative for rash.  Neurological: Positive for weakness. Negative for focal weakness.    Past Medical History  Diagnosis Date  . 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 CKD (chronic kidney disease) stage 3, GFR 30-59 ml/min   . V-tach (HCC)   . PAF (paroxysmal atrial fibrillation) (HCC)   . CVA (cerebral infarction)   . BPH (benign prostatic hyperplasia)   . HTN (hypertension), benign   . Hyperlipidemia     Social History  Substance Use Topics  . Smoking status: Never Smoker   . Smokeless tobacco: None  . Alcohol Use: No    Family History  Problem Relation Age of Onset  . Hypertension Mother   . CVA Maternal Aunt    No Known Allergies  OBJECTIVE: Blood pressure 99/87, pulse 115, temperature 99.2 F (37.3 C), temperature source Oral, resp. rate 32, height  (1.702 m), weight 135 lb 2.3 oz (61.3 kg), SpO2 99 %.  Physical Exam  Constitutional: He is oriented to person, place, and time.  He is thin and appears frail but in no distress. His wife is at the bedside.  HENT:  Mouth/Throat: No oropharyngeal exudate.  Eyes: Conjunctivae are normal.  Neck:  He has a triple-lumen central line on the right side of his neck that was placed on 09/29/2015. His initial PICC has been removed.  Cardiovascular: Normal rate and regular rhythm.   No murmur heard. Normal hum of the LVAD is heard.  Pulmonary/Chest: Breath sounds  normal.  His left anterior chest ICD site appears normal. Dressings over his LVAD sites are clean and dry.  Abdominal: Soft. He exhibits no mass. There is no tenderness.  Genitourinary:  Foley catheter in place.  Musculoskeletal: Normal range of motion.  Neurological: He is alert and oriented to person, place, and time.  Skin: No rash noted.  Psychiatric: Mood and affect normal.    Lab Results Lab Results  Component Value Date   WBC 14.8* 10/01/2015   HGB 7.7* 10/01/2015   HCT 22.2* 10/01/2015   MCV 82.5 10/01/2015   PLT 408* 10/01/2015    Lab Results  Component Value Date   CREATININE 2.38* 10/01/2015   BUN 34* 10/01/2015   NA 148* 10/01/2015   K 3.1* 10/01/2015   CL 116* 10/01/2015   CO2 24 10/01/2015    Lab Results  Component Value Date   ALT 26 10/01/2015   AST 91* 10/01/2015  ALKPHOS 117 10/01/2015   BILITOT 1.1 10/01/2015     Microbiology: Recent Results (from the past 240 hour(s))  Culture, respiratory (NON-Expectorated)     Status: None   Collection Time: 09/26/15  9:00 AM  Result Value Ref Range Status   Specimen Description TRACHEAL ASPIRATE  Final   Special Requests Normal  Final   Gram Stain   Final    RARE WBC PRESENT,BOTH PMN AND MONONUCLEAR MODERATE SQUAMOUS EPITHELIAL CELLS PRESENT NO ORGANISMS SEEN Performed at [facility]    Culture   Final    NO GROWTH 2 DAYS Performed at [facility]    Report Status 09/29/2015 FINAL  Final  Culture, blood (single)     Status: None (Preliminary result)   Collection Time: 09/29/15  8:30 AM  Result Value Ref Range Status   Specimen Description BLOOD PICC LINE  Final   Special Requests BOTTLES DRAWN AEROBIC AND ANAEROBIC 10CC  Final   Culture NO GROWTH 1 DAY  Final   Report Status PENDING  Incomplete  Urine culture     Status: None   Collection Time: 09/29/15 10:00 AM  Result Value Ref Range Status   Specimen Description URINE, CATHETERIZED  Final   Special Requests NONE  Final    Culture NO GROWTH 1 DAY  Final   Report Status 09/30/2015 FINAL  Final  Culture, blood (single)     Status: None (Preliminary result)   Collection Time: 09/29/15 11:45 AM  Result Value Ref Range Status   Specimen Description BLOOD LEFT HAND  Final   Special Requests IN PEDIATRIC BOTTLE 3CC  Final   Culture  Setup Time   Final    YEAST IN PEDIATRIC BOTTLE CRITICAL RESULT CALLED TO, READ BACK BY AND VERIFIED WITH: N SARHAM @0729  10/01/15 MKELLY    Culture YEAST CULTURE REINCUBATED FOR BETTER GROWTH   Final   Report Status PENDING  Incomplete  C difficile quick scan w PCR reflex     Status: None   Collection Time: 09/29/15  1:37 PM  Result Value Ref Range Status   C Diff antigen NEGATIVE NEGATIVE Final   C Diff toxin NEGATIVE NEGATIVE Final   C Diff interpretation Negative for toxigenic C. difficile  Final  Culture, respiratory (NON-Expectorated)     Status: None (Preliminary result)   Collection Time: 09/30/15  9:27 AM  Result Value Ref Range Status   Specimen Description INDUCED SPUTUM  Final   Special Requests Normal  Final   Gram Stain   Final    MODERATE WBC PRESENT, PREDOMINANTLY PMN MODERATE SQUAMOUS EPITHELIAL CELLS PRESENT RARE GRAM POSITIVE COCCI IN PAIRS RARE YEAST RARE GRAM NEGATIVE RODS    Culture   Final    Culture reincubated for better growth Performed at [facility]    Report Status PENDING  Incomplete  Gram stain     Status: None   Collection Time: 09/30/15 10:21 AM  Result Value Ref Range Status   Specimen Description FLUID PLEURAL LEFT  Final   Special Requests NONE  Final   Gram Stain   Final    MODERATE WBC PRESENT,BOTH PMN AND MONONUCLEAR NO ORGANISMS SEEN    Report Status 09/30/2015 FINAL  Final  Culture, blood (routine x 2)     Status: None (Preliminary result)   Collection Time: 10/01/15  9:55 AM  Result Value Ref Range Status   Specimen Description BLOOD LEFT WRIST  Final   Special Requests BOTTLES DRAWN AEROBIC AND ANAEROBIC 5CC  Final   Culture PENDING  Incomplete   Report Status PENDING  Incomplete  Culture, blood (routine x 2)     Status: None (Preliminary result)   Collection Time: 10/01/15 10:03 AM  Result Value Ref Range Status   Specimen Description BLOOD LEFT HAND  Final   Special Requests IN PEDIATRIC BOTTLE 3CC  Final   Culture PENDING  Incomplete   Report Status PENDING  Incomplete    [name], MD [facility] [phone] pager   [phone] cell 10/01/2015, 11:41 AM

## 2015-10-01 NOTE — Progress Notes (Signed)
Physical Therapy Treatment Patient Details Name: Brad [name] MRN: [id number] DOB: 06-29-1941 Today's Date: 10/01/2015    History of Present Illness 75 y/o male with h/o PAF, previous CVA, HTN, HL, V. 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 Jude CTR-D (initial ICD 2007 with upgrade to CRT in 2015). Pt s/p LVAD placement 12/28.    PT Comments    Treatment limited by pt due to not feeling well.  Clearly weak, very fatigued with depressed PI during mobility.  Pt remained up in the recliner when PI moderated a bit and pt not showing signs of being symptomatic.  Follow Up Recommendations  CIR;Supervision/Assistance - 24 hour     Equipment Recommendations  Rolling walker with 5" wheels;3in1 (PT)    Recommendations for Other Services Rehab consult     Precautions / Restrictions Precautions Precautions: Fall;Sternal Precaution Comments: LVAD, 1 chest tub, panda Restrictions Other Position/Activity Restrictions: sternal precautions    Mobility  Bed Mobility Overal bed mobility: Needs Assistance Bed Mobility: Supine to Sit     Supine to sit: Mod assist;+2 for safety/equipment;+2 for physical assistance     General bed mobility comments: cues for technique/sternal precautions.     Transfers Overall transfer level: Needs assistance Equipment used: 2 person hand held assist;Rolling walker (2 wheeled) Transfers: Sit to/from [employer] Sit to Stand: Mod assist;+2 physical assistance;+2 safety/equipment Stand pivot transfers: Mod assist;+2 physical assistance       General transfer comment: Pt required significant assist to power up into standing this date. PI starting 5.6 with drop to 3.4 while standing. Pt denies dizziness. Once pt seated, PI slowly increased back to 5.6 after ~3-4 mins RN aware   Ambulation/Gait             General Gait Details: unable today due to feeling poorly   Stairs            Wheelchair  Mobility    Modified Rankin (Stroke Patients Only)       Balance Overall balance assessment: Needs assistance Sitting-balance support: Feet supported Sitting balance-Leahy Scale: Poor Sitting balance - Comments: min assist for stability   Standing balance support: Bilateral upper extremity supported Standing balance-Leahy Scale: Poor                      Cognition Arousal/Alertness: Awake/alert Behavior During Therapy: Flat affect Overall Cognitive Status: Within Functional Limits for tasks assessed     Current Attention Level: Sustained   Following Commands: Follows one step commands consistently;Follows one step commands with increased time     Problem Solving: Slow processing      Exercises General Exercises - Lower Extremity Hip Flexion/Marching: AROM;Strengthening;Both;10 reps;Supine    General Comments General comments (skin integrity, edema, etc.): PI drop by more than 2 points, but  did not need to return to bed due to return of PI's close to beginning value.      Pertinent Vitals/Pain Pain Assessment: Faces Faces Pain Scale: Hurts little more Pain Location: sternal Pain Descriptors / Indicators: Grimacing Pain Intervention(s): Limited activity within patient's tolerance;Monitored during session    Home Living                      Prior Function            PT Goals (current goals can now be found in the care plan section) Acute Rehab PT Goals PT Goal Formulation: With patient Time For  Goal Achievement: 10/10/2015 Potential to Achieve Goals: Good Progress towards PT goals: Progressing toward goals    Frequency  Min 3X/week    PT Plan Current plan remains appropriate    Co-evaluation PT/OT/SLP Co-Evaluation/Treatment: Yes Reason for Co-Treatment: Complexity of the patient's impairments (multi-system involvement);For patient/therapist safety PT goals addressed during session: Mobility/safety with mobility OT goals addressed  during session: ADL's and self-care     End of Session Equipment Utilized During Treatment: Oxygen Activity Tolerance: Patient limited by fatigue Patient left: in chair;with call bell/phone within reach;with SCD's reapplied;with family/visitor present     Time: 2841-3244 PT Time Calculation (min) (ACUTE ONLY): 23 min  Charges:  $Therapeutic Activity: 8-22 mins                    G Codes:      Takera Rayl, [name] 10/01/2015, 1:33 PM 10/01/2015  Braxton Bing, PT [phone]  (pager)

## 2015-10-01 NOTE — Progress Notes (Signed)
Patient ID: Brad [name], male   DOB: March 20, 1941, 75 y.o.   MRN: [id number]    Advanced Heart Failure Rounding Note HeartMate 2 Rounding Note  Subjective:    Admitted from [facility] with cardiogenic shock for advanced heart failure as INTERMACS-1.  On 12/22 foley placed by Urology requiring urethral dilation -> UTI . ? Septic shock. Placed on vanco/zosyn. IABP placed 12/23. S/p HM 2 LVAD placement 10-01-2015.   Developed fever 09/29/15 with Tmax 102.5. Pan cultures sent. Switched from zosyn to imipinem. Remains on vanc. Fluconazole added yesterday.  Feels weak. Febrile again this morning. Vomiting last night.  1/2 Bcx back with yeast. S/p cehst tube for L effusion (09/30/15) = exudative. Still with diarrhea but c.diff negative.   CVP 8-10. Getting albumin Remains on Milrinone 0.3, Coox 64% this morning. MAPs 90s  Creatinine 2.64 => 2.8 => 3.2 => 3.12 => 2.90 => 2.61=> 2.38.  K 3.1 INR 2.8, LDH 285 => 266 => 300  Blood Cx 09/29/15- 1/2 yeast  Urine Cx 09/29/15 - NG UA 09/29/15- with few bacteria  C. Diff 09/29/15 - Negative Resp Culture 09/25/16 - Negative.  Driveline change 09/29/15 with small amount of serous drainage, otherwise unremarkable.   LVAD INTERROGATION:  HeartMate II LVAD:  Flow 6.5 iters/min, speed 9000, power 5.9, PI 4.9 + PI events  Objective:    Vital Signs:   Temp:  [97.8 F (36.6 C)-101 F (38.3 C)] 101 F (38.3 C) (01/05 0357) Pulse Rate:  [108-123] 112 (01/05 0700) Resp:  [0-39] 26 (01/05 0700) BP: (79-111)/(53-97) 97/86 mmHg (01/05 0700) SpO2:  [92 %-100 %] 100 % (01/05 0700) Weight:  [61.3 kg (135 lb 2.3 oz)] 61.3 kg (135 lb 2.3 oz) (01/05 0500) Last BM Date: 09/30/15 Mean arterial Pressure 90s  Intake/Output:   Intake/Output Summary (Last 24 hours) at 10/01/15 0739 Last data filed at 10/01/15 0700  Gross per 24 hour  Intake 2329.2 ml  Output   4305 ml  Net -1975.8 ml     Physical Exam: General: NAD, Elderly appearing. Weak. Warm HEENT: normal x for  NGT Neck: supple.  JVP 10 cm.  Carotids 2+ bilat; no bruits. No thyromegaly or nodule noted.  Cor: PMI nondisplaced. RRR. LVAD hum present. Left CT Lungs: Decreased bases bilaterally.. Mildly tachypneic. Abdomen: soft, NT, ND, no HSM. No bruits or masses. +BS  Extremities: no cyanosis, clubbing, rash. RUE PICC, LUE radial arterial line. 2+ edema into thighs. SCDs present Neuro: alert & orientedx3, cranial nerves grossly intact. moves all 4 extremities w/o difficulty. Affect pleasant  Telemetry: reviewed personally, a sensed v pacing in 110-115, occasional PVCs   Labs: Basic Metabolic Panel:  Recent Labs Lab 09/25/15 0340  09/26/15 0448  09/27/15 0345 09/28/15 0425 09/29/15 0515 09/30/15 0500 10/01/15 0415  NA 146*  < > 146*  < > 144 146* 145 148* 148*  K 3.7  < > 4.1  < > 3.6 3.5 3.4* 3.6 3.1*  CL 114*  < > 111  < > 109 113* 111 112* 116*  CO2 23  --  22  --  23 22 25 24 24   GLUCOSE 125*  < > 127*  < > 129* 102* 125* 124* 201*  BUN 16  < > 20  < > 31* 37* 35* 30* 34*  CREATININE 1.97*  < > 2.64*  < > 3.20* 3.12* 2.90* 2.61* 2.38*  CALCIUM 9.0  --  8.8*  --  8.8* 8.9 8.9 8.9 9.0  MG 2.5*  --  2.3  --  2.5* 2.4 2.2 2.0  --   PHOS 4.2  --  4.4  --   --   --   --   --   --   < > = values in this interval not displayed.  Liver Function Tests:  Recent Labs Lab 09/27/15 0345 09/28/15 0425 09/29/15 0515 09/30/15 0500 10/01/15 0415  AST 36 29 26 34 91*  ALT 21 17 16* 18 26  ALKPHOS 103 102 115 126 117  BILITOT 1.1 1.0 1.2 1.4* 1.1  PROT 5.3* 5.4* 5.6* 5.8* 5.7*  ALBUMIN 2.2* 2.5* 2.5* 2.3* 2.9*   No results for input(s): LIPASE, AMYLASE in the last 168 hours. No results for input(s): AMMONIA in the last 168 hours.  CBC:  Recent Labs Lab 09/26/15 0448 09/26/15 1634 09/27/15 0345 09/28/15 0425 09/29/15 0515 09/30/15 0500  WBC 15.5*  --  16.2* 12.5* 8.8 14.9*  NEUTROABS 12.5*  --  12.9* 9.6* 6.6 11.8*  HGB 10.6* 12.2* 11.0* 10.6* 10.1* 10.5*  HCT 29.8* 36.0*  30.1* 30.6* 28.8* 29.3*  MCV 82.5  --  83.1 82.3 82.5 84.4  PLT 255  --  291 311 323 420*    INR:  Recent Labs Lab 09/27/15 0345 09/28/15 0425 09/29/15 0515 09/30/15 0500 10/01/15 0415  INR 2.70* 2.86* 3.46* 2.56* 2.80*    Other results:  EKG:   Imaging: Dg Chest Port 1 View  09/30/2015  CLINICAL DATA:  New left chest tube insertion EXAM: PORTABLE CHEST 1 VIEW COMPARISON:  Earlier today FINDINGS: New left-sided chest tube with tip at the apex. Left pleural fluid has been drained with deep sulcus sign and left lower lobe atelectasis. This may reflect trapped lung. No indication of re-expansion pulmonary edema. Layering at least moderate right pleural effusion is stable. Stable cardiopericardial enlargement in this patient with partially visualized LVAD. Biventricular ICD/ pacer leads from the left. Right subclavian central line, tip at the upper cavoatrial junction. IMPRESSION: 1. New left chest tube with drained pleural effusion. Left basilar pneumothorax with possible trapped lung. 2. Remainder of the chest is stable from earlier today. Electronically Signed   By: Marnee Spring M.D.   On: 09/30/2015 09:39   Dg Chest Port 1 View  09/30/2015  CLINICAL DATA:  Acute and chronic CHF, cardiogenic shock, left ventricular assist device. EXAM: PORTABLE CHEST 1 VIEW COMPARISON:  Portable chest x-ray of September 29, 2015. FINDINGS: The right lung is adequately inflated. There is a small right pleural effusion. On the left there is persistent volume loss. Thin interstitial density in the aerated left upper lobe persists. The retrocardiac region is dense. The cardiac silhouette is enlarged. The pulmonary vascularity where visualized on the right is only mildly prominent. The left ventricular assist device is in reasonable position radiographically. The implantable pacemaker defibrillator also appears to be in reasonable position. The right internal jugular venous catheter has been removed and a PICC line  placed whose tip overlies the junction of the middle and distal thirds of the SVC. There are 7 intact sternal wires. IMPRESSION: Allowing for differences in positioning in inflation there has not been significant interval change since yesterday's study. There is low-grade CHF. There are bilateral pleural effusions greater on the left than on the right. There is left lower lobe atelectasis or pneumonia. Electronically Signed   By: David  [place] M.D.   On: 09/30/2015 08:03   Dg Chest Port 1 View  09/29/2015  CLINICAL DATA:  Recent left ventricular assist device placement.  Central catheter placement EXAM: PORTABLE CHEST 1 VIEW COMPARISON:  Study obtained earlier in the day FINDINGS: There is a new right central catheter with tip in the superior vena cava. A second central catheter also has its tip in the superior vena cava. There is a left ventricular assist device as well as a defibrillator with leads attached to the right atrium, right ventricle, and left ventricle, stable. Temporary pacemaker wires are also attached to the right heart. There is no demonstrable pneumothorax. There is a sizable left pleural effusion with a smaller right pleural effusion. There is airspace consolidation left lower lobe, stable. There is no new opacity. Heart is enlarged with mild pulmonary venous hypertension. IMPRESSION: Catheter positions as described without pneumothorax. Bilateral pleural effusions, larger on the left than on the right. Stable airspace consolidation left lower lobe. Stable cardiomegaly. No new opacity apparent. Electronically Signed   By: Bretta Bang III M.D.   On: 09/29/2015 09:08   Dg Abd Portable 1v  09/30/2015  CLINICAL DATA:  Feeding tube placement. EXAM: PORTABLE ABDOMEN - 1 VIEW COMPARISON:  CT scan dated 09/22/2015 FINDINGS: Feeding tube tip appears to be in the duodenal bulb region. New left ventricular assist device in place. Bowel gas pattern is normal. IMPRESSION: Feeding tube tip appears to  be in the duodenal bulb. Electronically Signed   By: Francene Boyers M.D.   On: 09/30/2015 14:42     Medications:     Scheduled Medications: . amiodarone  200 mg Oral Daily  . aspirin  81 mg Oral Daily  . docusate sodium  200 mg Oral Daily  . feeding supplement (ENSURE ENLIVE)  237 mL Oral TID BM  . feeding supplement (VITAL 1.5 CAL)  1,000 mL Per Tube Q24H  . fluconazole (DIFLUCAN) IV  100 mg Intravenous Q24H  . furosemide  40 mg Intravenous Daily  . hydrALAZINE  25 mg Oral 3 times per day  . imipenem-cilastatin  250 mg Intravenous Q8H  . insulin aspart  0-5 Units Subcutaneous QHS  . insulin aspart  0-9 Units Subcutaneous TID WC  . pantoprazole  40 mg Oral Daily  . sildenafil  40 mg Oral TID  . sodium chloride  10 mL Intravenous Q12H  . vancomycin  750 mg Intravenous Q24H  . warfarin  2 mg Oral q1800  . Warfarin - Physician Dosing Inpatient   Does not apply q1800    Infusions: . sodium chloride Stopped (09/25/15 1100)  . sodium chloride 250 mL (10/01/15 0400)  . EPINEPHrine 4 mg in dextrose 5% 250 mL infusion (16 mcg/mL) Stopped (09/27/15 1100)  . lactated ringers Stopped (09/26/15 0000)  . lactated ringers Stopped (09/24/15 0700)  . milrinone 0.3 mcg/kg/min (10/01/15 0400)  . norepinephrine (LEVOPHED) Adult infusion Stopped (09/27/15 0600)    PRN Medications: sodium chloride, Gerhardt's butt cream, hydrALAZINE, ondansetron (ZOFRAN) IV, oxyCODONE, sodium chloride   Assessment/Plan   1. Acute on chronic systolic HF -> Cardiogenic shock: NICM with EF 5%, mild to moderate RV hypokinesis.  S/p St Jude CRT-D. s/p HM II LVAD placement 10/10/2015. Co-ox 65.4%. Creatinine trending down.  - Stable from cardiac standpoint on milrinone 0.3. Co-ox 64%]\ -Continue diuresis. use albumin as needed 2. Atrial fibrillation: Paroxysmal.    - Maintaining sinus  - On coumadin and po amiodarone 3. 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   - 3-5 beat NSVT runs on tele.  K 3.1. Will supp. 4. AKI on CKD:  Creatinine 1.9 prior to admission.  3.2 => 3.12 => 2.90 =>  2.6 => 2.4 - Improving. Continue gentle diuresis 5. Pulmonary HTN:   - Continue milrinone and sildenafil 40 tid. 6. Anticoagulation:  - INR 2.86 => 3.46 => 2.56.=> 2.8 - On ASA 81 mg daily.   7. ID: Now with fungemia  -  Remains febrile on vanc/imipenem. Fluconazole added yesterday. Will consult ID -   R transudative effusion s/p chest tube 1/4  8.Conditioning - Working PT. Walked less yesterday with fever. Recommend CIR.   Remains very tenuous. From heart and kidney perspective doing ok. Main issues now seems to be fungemia. Fluconazole added yesterday and R effusion (transudate) tapped. Will consult ID. Continue gentle diuresis  I reviewed the LVAD parameters from today, and compared the results to the patient's prior recorded data.  No programming changes were made.  The LVAD is functioning within specified parameters. LVAD interrogation was negative for any significant power changes, alarms or PI events/speed drops.  LVAD equipment check completed and is in good working order.  Back-up equipment present.    The patient is critically ill with multiple organ systems failure and requires high complexity decision making for assessment and support, frequent evaluation and titration of therapies, application of advanced monitoring technologies and extensive interpretation of multiple databases.   Critical Care Time devoted to patient care services described in this note is 35 Minutes.   Length of Stay: 15  Arvilla Meres MD  10/01/2015, 7:39 AM  VAD Team --- VAD ISSUES ONLY--- Pager [phone] (7am - 7am)

## 2015-10-02 ENCOUNTER — Inpatient Hospital Stay (HOSPITAL_COMMUNITY): Payer: Medicare Other

## 2015-10-02 LAB — GLUCOSE, CAPILLARY
GLUCOSE-CAPILLARY: 134 mg/dL — AB (ref 65–99)
GLUCOSE-CAPILLARY: 173 mg/dL — AB (ref 65–99)
Glucose-Capillary: 152 mg/dL — ABNORMAL HIGH (ref 65–99)
Glucose-Capillary: 134 mg/dL — ABNORMAL HIGH (ref 65–99)

## 2015-10-02 LAB — TYPE AND SCREEN
ABO/RH(D): B POS
Antibody Screen: NEGATIVE
Unit division: 0
Unit division: 0

## 2015-10-02 LAB — CBC
HEMATOCRIT: 31.2 % — AB (ref 39.0–52.0)
HEMOGLOBIN: 10.9 g/dL — AB (ref 13.0–17.0)
MCH: 29.1 pg (ref 26.0–34.0)
MCHC: 34.9 g/dL (ref 30.0–36.0)
MCV: 83.2 fL (ref 78.0–100.0)
Platelets: 475 10*3/uL — ABNORMAL HIGH (ref 150–400)
RBC: 3.75 MIL/uL — ABNORMAL LOW (ref 4.22–5.81)
RDW: 15.5 % (ref 11.5–15.5)
WBC: 16.4 10*3/uL — ABNORMAL HIGH (ref 4.0–10.5)

## 2015-10-02 LAB — BASIC METABOLIC PANEL
Anion gap: 8 (ref 5–15)
BUN: 35 mg/dL — ABNORMAL HIGH (ref 6–20)
CO2: 26 mmol/L (ref 22–32)
Calcium: 9 mg/dL (ref 8.9–10.3)
Chloride: 116 mmol/L — ABNORMAL HIGH (ref 101–111)
Creatinine, Ser: 2.32 mg/dL — ABNORMAL HIGH (ref 0.61–1.24)
GFR calc Af Amer: 30 mL/min — ABNORMAL LOW (ref >60)
GFR calc non Af Amer: 26 mL/min — ABNORMAL LOW (ref >60)
Glucose, Bld: 195 mg/dL — ABNORMAL HIGH (ref 65–99)
Potassium: 3.6 mmol/L (ref 3.5–5.1)
Sodium: 150 mmol/L — ABNORMAL HIGH (ref 135–145)

## 2015-10-02 LAB — CARBOXYHEMOGLOBIN
Carboxyhemoglobin: 2.3 % — ABNORMAL HIGH (ref 0.5–1.5)
Methemoglobin: 1 % (ref 0.0–1.5)
O2 Saturation: 69.5 %
Total hemoglobin: 11.1 g/dL — ABNORMAL LOW (ref 13.5–18.0)

## 2015-10-02 LAB — LACTATE DEHYDROGENASE: LDH: 413 U/L — ABNORMAL HIGH (ref 98–192)

## 2015-10-02 LAB — PROTIME-INR
INR: 1.8 — ABNORMAL HIGH (ref 0.00–1.49)
Prothrombin Time: 20.8 seconds — ABNORMAL HIGH (ref 11.6–15.2)

## 2015-10-02 MED ORDER — MILRINONE IN DEXTROSE 20 MG/100ML IV SOLN
0.1250 ug/kg/min | INTRAVENOUS | Status: DC
  Administered 2015-10-02: 0.25 ug/kg/min via INTRAVENOUS
  Administered 2015-10-04: 0.2 ug/kg/min via INTRAVENOUS
  Administered 2015-10-05: 0.125 ug/kg/min via INTRAVENOUS
  Filled 2015-10-02 (×3): qty 100

## 2015-10-02 MED ORDER — ALBUMIN HUMAN 25 % IV SOLN
12.5000 g | Freq: Four times a day (QID) | INTRAVENOUS | Status: AC
  Administered 2015-10-02 – 2015-10-03 (×4): 12.5 g via INTRAVENOUS
  Filled 2015-10-02 (×6): qty 50

## 2015-10-02 MED ORDER — KCL IN DEXTROSE-NACL 20-5-0.45 MEQ/L-%-% IV SOLN
INTRAVENOUS | Status: DC
  Administered 2015-10-02: 40 mL/h via INTRAVENOUS
  Administered 2015-10-03: 07:00:00 via INTRAVENOUS
  Filled 2015-10-02 (×4): qty 1000

## 2015-10-02 MED ORDER — WARFARIN SODIUM 2.5 MG PO TABS
2.5000 mg | ORAL_TABLET | Freq: Every day | ORAL | Status: DC
  Administered 2015-10-02: 2.5 mg via ORAL
  Filled 2015-10-02: qty 1

## 2015-10-02 NOTE — Progress Notes (Addendum)
LVAD Coordinator Advanced Heart Failure Rounds:  HM II LVAD Implanted 09/22/2015 as DT by Dr. Donata Clay.   Fevers better today. Sitting up in the chair eating breakfast. Panda feeding tube has been placed and is receiving night feedings 7p -  7a with Ensure supplementation to assist with severe malnutrition.   Vital signs: Temp: 99.6 deg HR: 110s A sensed, V paced  ICD SJM CRT-D (defib therapy off, pacing on) Auto cuff BP: 82 - 95 / 66 - 80  MAP O2 Sat: 96 - 93%  Wt:142.6 lbs (preop) > 161 > 155 > 150 > 143 > 149 > 142 > 139   LVAD interrogation reveals:  Speed: 8990 Flow: 4.2 Power: 4.8w PI: 5.4 Alarms: none Events: 5 - 10 PI daily with PI 2.8 - 4.2 Fixed speed: 9000 Low speed limit: 8400  Back up controller programmed accordingly and is at the bedside. LVAD is functioning within expected parameters. No programming changes were made this morning.   Labs:  LDH trend: 372 > ...> 300 > 353 > 413  INR trend: 1.21 > ... > 3.46 > 2.56 > 2.80 > 1.80  Hgb: 11.6 > 10.4 > 10.1 > 10.6 > 11.0 > 10.6 > 10.1>10.5  WBC:  8.9 > 10.7 > 12.3 > 15.5 > 16.2 > 14.6 > 16.4  Co-Ox: 69% today-->decreased milrinone  Creatinine: 1.6 (preop) > ... > 3.20 > 3.12 > 2.90 > 2.61 > 2.38 > 1.90 > 2.32  Antithrombotic Management: 09/24/15--> 325 mg ASA  09/24/15--> Warfarin started  Infusions: Milrinone 0.25  Epi 3 (off 09/27/15) Levo (off 09/27/15)  OR Blood Products: 4 u FFP 2 u Platelets Cell saver  ICU Blood Products:  08/28/2015--> 2 FFP, 2 pRBC (Hgb 7) 09/24/15 - 2 units pRBC 10/01/15 --> 2 u PRBC  Ventilator: Extubated 09/25/15  NO weaned off. On Sildenafil 40 mg TID for PAH   Infection: 1/2 BCs + for yeast   anidulafungin (ERAXIS) 100 mg QD 10/02/15 >> current  imipenem-cilastatin (Primaxin) 250 mg Q8H 09/29/15 >> current   Plan as discussed with team: 1. Ensure supplementation for now--if he cannot maintain this outpatient at current dose due to cost will need to wean him off  prior to D/C to adjust coumadin dose.   2. With PI's dropping <4 occasionally would recommend to doppler BP first then follow up with automatic BP cuff. When pulsatility is low the automatic BP cuff is not the most reliable method of measurement.   3. Wife to perform dressing changes when she is here please. Will bring D/C binder up to his room for him to look over today. Hoping to do some education next week if ready. He is interested in reading the manual/binder. Will also offer to review the DVD on our laptop. Planning to trial at least 4 days in a hotel locally prior to formal D/C home to [place] so they are close by for the first few nights alone with equipment.   Rexene Alberts, BSN, RN, CCRN VAD Coordinator   Office: [phone] 24/7 VAD Pager: [phone]

## 2015-10-02 NOTE — Progress Notes (Signed)
Occupational Therapy Treatment Patient Details Name: Brad [name] MRN: [id number] DOB: Mar 07, 1941 Today's Date: 10/02/2015    History of present illness 75 y/o male with h/o PAF, previous CVA, HTN, HL, V. 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 Jude CTR-D (initial ICD 2007 with upgrade to CRT in 2015). Pt s/p LVAD placement 12/28.   OT comments  Pt with improved activity tolerance today.  Ambulated with +2 assist.  PI 7.6 at beginning of session and 7.3 during session.  Tolerated well.  Continues to move slowly overall, continue to recommend CIR.   Follow Up Recommendations  CIR;Supervision/Assistance - 24 hour    Equipment Recommendations  3 in 1 bedside comode;Tub/shower seat    Recommendations for Other Services      Precautions / Restrictions Precautions Precautions: Fall;Sternal Precaution Comments: LVAD, 1 chest tub, panda Restrictions Other Position/Activity Restrictions: sternal precautions       Mobility Bed Mobility               General bed mobility comments: sitting up in chair   Transfers Overall transfer level: Needs assistance Equipment used: 2 person hand held assist;Rolling walker (2 wheeled) Transfers: Sit to/from [employer] Sit to Stand: Mod assist;+2 physical assistance Stand pivot transfers: Mod assist;+2 physical assistance       General transfer comment: Pt requires cues to avoid using hands, and assist to power up into standing     Balance Overall balance assessment: Needs assistance Sitting-balance support: Feet supported Sitting balance-Leahy Scale: Poor     Standing balance support: Bilateral upper extremity supported Standing balance-Leahy Scale: Poor                     ADL                           Toilet Transfer: Moderate assistance;+2 for physical assistance;Ambulation;Comfort height toilet;BSC;RW                    Vision                     Perception     Praxis      Cognition   Behavior During Therapy: Flat affect                  Problem Solving: Slow processing      Extremity/Trunk Assessment               Exercises     Shoulder Instructions       General Comments      Pertinent Vitals/ Pain       Pain Assessment: No/denies pain  Home Living                                          Prior Functioning/Environment              Frequency Min 3X/week     Progress Toward Goals  OT Goals(current goals can now be found in the care plan section)  Progress towards OT goals: Progressing toward goals     Plan Discharge plan remains appropriate    Co-evaluation    PT/OT/SLP Co-Evaluation/Treatment: Yes Reason for Co-Treatment: Complexity of the patient's impairments (multi-system involvement);For patient/therapist safety   OT goals addressed during session: ADL's  and self-care      End of Session Equipment Utilized During Treatment: Oxygen   Activity Tolerance Patient tolerated treatment well   Patient Left in bed;with call bell/phone within reach   Nurse Communication Mobility status        Time: 1207-1233 OT Time Calculation (min): 26 min  Charges: OT General Charges $OT Visit: 1 Procedure OT Treatments $Therapeutic Activity: 8-22 mins  Brad [name] M 10/02/2015, 2:28 PM

## 2015-10-02 NOTE — Progress Notes (Signed)
Patient ID: Rue Tinnel, male   DOB: 04/11/1941, 75 y.o.   MRN: [id number]         [facility]    Date of Admission:  09/22/2015   Total days of antibiotics 16        Day 16 vancomycin        Day 4 imipenem        Day 2 anidulafungin  Principal Problem:   Fungemia Active Problems:   Acute on chronic systolic and diastolic heart failure, NYHA class 4 (HCC)   LVAD (left ventricular assist device) present (HCC)   Automatic implantable cardioverter-defibrillator in situ   Cardiogenic shock (HCC)   Acute renal failure superimposed on stage 3 chronic kidney disease (HCC)   PAF (paroxysmal atrial fibrillation) (HCC)   PAH (pulmonary artery hypertension) (HCC)   Dyspnea   Hypotension   Palliative care encounter   CHF (congestive heart failure) (HCC)   AKI (acute kidney injury) (HCC)   . albumin human  12.5 g Intravenous Q6H  . amiodarone  200 mg Oral Daily  . anidulafungin  100 mg Intravenous Q24H  . aspirin  81 mg Oral Daily  . docusate sodium  200 mg Oral Daily  . feeding supplement (ENSURE ENLIVE)  237 mL Oral TID BM  . feeding supplement (VITAL 1.5 CAL)  1,000 mL Per Tube Q24H  . hydrALAZINE  25 mg Oral 3 times per day  . imipenem-cilastatin  250 mg Intravenous Q8H  . insulin aspart  0-5 Units Subcutaneous QHS  . insulin aspart  0-9 Units Subcutaneous TID WC  . metoCLOPramide (REGLAN) injection  5 mg Intravenous 3 times per day  . pantoprazole  40 mg Oral Daily  . sildenafil  40 mg Oral TID  . sodium chloride  10 mL Intravenous Q12H  . warfarin  2.5 mg Oral q1800  . Warfarin - Physician Dosing Inpatient   Does not apply q1800    SUBJECTIVE: He is feeling better today. His cough is not bothering him as much.  Review of Systems: Review of Systems  Constitutional: Positive for malaise/fatigue. Negative for fever, chills and diaphoresis.  Respiratory: Negative for cough and sputum production.     Past Medical History  Diagnosis Date  .  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 CKD (chronic kidney disease) stage 3, GFR 30-59 ml/min   . V-tach (HCC)   . PAF (paroxysmal atrial fibrillation) (HCC)   . CVA (cerebral infarction)   . BPH (benign prostatic hyperplasia)   . HTN (hypertension), benign   . Hyperlipidemia     Social History  Substance Use Topics  . Smoking status: Never Smoker   . Smokeless tobacco: None  . Alcohol Use: No    Family History  Problem Relation Age of Onset  . Hypertension Mother   . CVA Maternal Aunt    No Known Allergies  OBJECTIVE: Filed Vitals:   10/02/15 0900 10/02/15 1000 10/02/15 1100 10/02/15 1150  BP: 95/83     Pulse: 109 108 110   Temp:    99.2 F (37.3 C)  TempSrc:    Oral  Resp: 25 23 20    Height:      Weight:      SpO2: 98% 99% 100%    Body mass index is 21.13 kg/(m^2).  Physical Exam  Constitutional: He is oriented to person, place, and time.  Upon entering the room he was asleep  in a chair but aroused easily. He is smiling and in good spirits today. He looks like he is feeling better.  Eyes: Conjunctivae are normal.  Cardiovascular: Normal rate and regular rhythm.   No murmur heard. LVAD hum noted.  Pulmonary/Chest:  Clear breath sounds anteriorly. Incisions look good.  Abdominal: Soft. He exhibits no mass. There is no tenderness.  Musculoskeletal: Normal range of motion.  Neurological: He is alert and oriented to person, place, and time.  Skin: No rash noted.  Psychiatric: Mood and affect normal.    Lab Results Lab Results  Component Value Date   WBC 16.4* 10/02/2015   HGB 10.9* 10/02/2015   HCT 31.2* 10/02/2015   MCV 83.2 10/02/2015   PLT 475* 10/02/2015    Lab Results  Component Value Date   CREATININE 2.32* 10/02/2015   BUN 35* 10/02/2015   NA 150* 10/02/2015   K 3.6 10/02/2015   CL 116* 10/02/2015   CO2 26 10/02/2015    Lab Results  Component Value Date   ALT 26 10/01/2015   AST 91* 10/01/2015    ALKPHOS 117 10/01/2015   BILITOT 1.1 10/01/2015     Microbiology: Recent Results (from the past 240 hour(s))  Culture, respiratory (NON-Expectorated)     Status: None   Collection Time: 09/26/15  9:00 AM  Result Value Ref Range Status   Specimen Description TRACHEAL ASPIRATE  Final   Special Requests Normal  Final   Gram Stain   Final    RARE WBC PRESENT,BOTH PMN AND MONONUCLEAR MODERATE SQUAMOUS EPITHELIAL CELLS PRESENT NO ORGANISMS SEEN Performed at [facility]    Culture   Final    NO GROWTH 2 DAYS Performed at [facility]    Report Status 09/29/2015 FINAL  Final  Culture, blood (single)     Status: None (Preliminary result)   Collection Time: 09/29/15  8:30 AM  Result Value Ref Range Status   Specimen Description BLOOD PICC LINE  Final   Special Requests BOTTLES DRAWN AEROBIC AND ANAEROBIC 10CC  Final   Culture NO GROWTH 3 DAYS  Final   Report Status PENDING  Incomplete  Urine culture     Status: None   Collection Time: 09/29/15 10:00 AM  Result Value Ref Range Status   Specimen Description URINE, CATHETERIZED  Final   Special Requests NONE  Final   Culture NO GROWTH 1 DAY  Final   Report Status 09/30/2015 FINAL  Final  Culture, blood (single)     Status: None (Preliminary result)   Collection Time: 09/29/15 11:45 AM  Result Value Ref Range Status   Specimen Description BLOOD LEFT HAND  Final   Special Requests IN PEDIATRIC BOTTLE 3CC  Final   Culture  Setup Time   Final    YEAST IN PEDIATRIC BOTTLE CRITICAL RESULT CALLED TO, READ BACK BY AND VERIFIED WITH: N SARHAM @0729  10/01/15 MKELLY    Culture YEAST  Final   Report Status PENDING  Incomplete  C difficile quick scan w PCR reflex     Status: None   Collection Time: 09/29/15  1:37 PM  Result Value Ref Range Status   C Diff antigen NEGATIVE NEGATIVE Final   C Diff toxin NEGATIVE NEGATIVE Final   C Diff interpretation Negative for toxigenic C. difficile  Final  Culture, respiratory  (NON-Expectorated)     Status: None (Preliminary result)   Collection Time: 09/30/15  9:27 AM  Result Value Ref Range Status   Specimen Description INDUCED SPUTUM  Final   Special Requests Normal  Final   Gram Stain   Final    MODERATE WBC PRESENT, PREDOMINANTLY PMN MODERATE SQUAMOUS EPITHELIAL CELLS PRESENT RARE GRAM POSITIVE COCCI IN PAIRS RARE YEAST RARE GRAM NEGATIVE RODS    Culture   Final    FEW GRAM NEGATIVE RODS Performed at [facility]    Report Status PENDING  Incomplete  Culture, body fluid-bottle     Status: None (Preliminary result)   Collection Time: 09/30/15 10:21 AM  Result Value Ref Range Status   Specimen Description FLUID PLEURAL LEFT  Final   Special Requests NONE  Final   Culture NO GROWTH 2 DAYS  Final   Report Status PENDING  Incomplete  Gram stain     Status: None   Collection Time: 09/30/15 10:21 AM  Result Value Ref Range Status   Specimen Description FLUID PLEURAL LEFT  Final   Special Requests NONE  Final   Gram Stain   Final    MODERATE WBC PRESENT,BOTH PMN AND MONONUCLEAR NO ORGANISMS SEEN    Report Status 09/30/2015 FINAL  Final  Culture, blood (routine x 2)     Status: None (Preliminary result)   Collection Time: 10/01/15  9:55 AM  Result Value Ref Range Status   Specimen Description BLOOD LEFT WRIST  Final   Special Requests BOTTLES DRAWN AEROBIC AND ANAEROBIC 5CC  Final   Culture NO GROWTH 1 DAY  Final   Report Status PENDING  Incomplete  Culture, blood (routine x 2)     Status: None (Preliminary result)   Collection Time: 10/01/15 10:03 AM  Result Value Ref Range Status   Specimen Description BLOOD LEFT HAND  Final   Special Requests IN PEDIATRIC BOTTLE 3CC  Final   Culture NO GROWTH 1 DAY  Final   Report Status PENDING  Incomplete     ASSESSMENT: Mr. Boyajian has defervesced and seems to be feeling better. Only one blood culture so far has grown yeast. The source of the fungemia remains uncertain. It is unlikely that it is  coming from a respiratory source. His chest x-ray worsened over the last week. And left thoracentesis and chest tube placement on 09/29/2014. Pleural fluid cultures are negative but sputum cultures growing gram-negative rods. I will continue imipenem and anidulafungin pending final cultures and stop vancomycin now.  PLAN: 1. Continue imipenem and anidulafungin 2. Discontinue vancomycin 3. Await final culture results 4. Once repeat blood cultures are negative it would be best to replace his central line  [name], MD [facility] [phone] pager   [phone] cell 10/02/2015, 1:27 PM

## 2015-10-02 NOTE — Progress Notes (Signed)
Physical Therapy Treatment Patient Details Name: Brad [name] MRN: [id number] DOB: June 15, 1941 Today's Date: 10/02/2015    History of Present Illness 75 y/o male with h/o PAF, previous CVA, HTN, HL, V. 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 Jude CTR-D (initial ICD 2007 with upgrade to CRT in 2015). Pt s/p LVAD placement 12/28.    PT Comments    Has improved function and mobility over last two days.  Seems to have a little more energy, has been sitting up and slowly progressing gait.  At this point pt has not completed education on battery switch over or any VAD education.  Follow Up Recommendations  CIR;Supervision/Assistance - 24 hour     Equipment Recommendations  Rolling walker with 5" wheels;3in1 (PT)    Recommendations for Other Services Rehab consult     Precautions / Restrictions Precautions Precautions: Fall;Sternal Precaution Comments: LVAD, 1 chest tub, panda Restrictions Weight Bearing Restrictions: Yes Other Position/Activity Restrictions: sternal precautions    Mobility  Bed Mobility Overal bed mobility: Needs Assistance Bed Mobility: Sit to Supine       Sit to supine: Mod assist;+2 for safety/equipment   General bed mobility comments: sitting up in chair   Transfers Overall transfer level: Needs assistance Equipment used: 2 person hand held assist;Rolling walker (2 wheeled) Transfers: Stand Pivot Transfers Sit to Stand: Mod assist;+2 physical assistance Stand pivot transfers: Mod assist;+2 physical assistance       General transfer comment: Pt requires cues to avoid using hands, and assist to power up into standing   Ambulation/Gait Ambulation/Gait assistance: Min assist Ambulation Distance (Feet): 120 Feet (then additional 100 feet with RW) Assistive device: Rolling walker (2 wheeled) Gait Pattern/deviations: Step-through pattern     General Gait Details: overall mildly unsteady worsening with distance and fatigue.   Listing to the R more with fatigue.   Stairs            Wheelchair Mobility    Modified Rankin (Stroke Patients Only)       Balance Overall balance assessment: Needs assistance Sitting-balance support: Feet supported Sitting balance-Leahy Scale: Poor Sitting balance - Comments: stability assist; tends to list posteriorly  and unable to uvercome the pitch of the chair to sit up forward.   Standing balance support: Bilateral upper extremity supported Standing balance-Leahy Scale: Poor Standing balance comment: reliant on the RW                    Cognition Arousal/Alertness: Awake/alert Behavior During Therapy: Flat affect Overall Cognitive Status: Within Functional Limits for tasks assessed               Problem Solving: Slow processing      Exercises      General Comments General comments (skin integrity, edema, etc.): Vitals including PI remained steady      Pertinent Vitals/Pain Pain Assessment: No/denies pain    Home Living                      Prior Function            PT Goals (current goals can now be found in the care plan section) Acute Rehab PT Goals Patient Stated Goal: did not state  PT Goal Formulation: With patient Time For Goal Achievement: 10/23/2015 Potential to Achieve Goals: Good Progress towards PT goals: Progressing toward goals    Frequency  Min 3X/week    PT Plan Current plan remains appropriate  Co-evaluation   Reason for Co-Treatment: Complexity of the patient's impairments (multi-system involvement) PT goals addressed during session: Mobility/safety with mobility OT goals addressed during session: ADL's and self-care     End of Session Equipment Utilized During Treatment: Oxygen Activity Tolerance: Patient tolerated treatment well Patient left: in bed;with call bell/phone within reach;with nursing/sitter in room     Time: 1205-1246 PT Time Calculation (min) (ACUTE ONLY): 41 min  Charges:   $Gait Training: 8-22 mins $Therapeutic Activity: 8-22 mins                    G Codes:      Brad [name], Brad [name] 10/02/2015, 2:37 PM 10/02/2015  Brad [name], PT [phone]  (pager)

## 2015-10-02 NOTE — Progress Notes (Signed)
Patient ID: Brad [name], male   DOB: 1941-07-24, 75 y.o.   MRN: [id number]    Advanced Heart Failure Rounding Note HeartMate 2 Rounding Note  Subjective:    Admitted from [facility] with cardiogenic shock for advanced heart failure as INTERMACS-1.  On 12/22 foley placed by Urology requiring urethral dilation -> UTI . ? Septic shock. Placed on vanco/zosyn. IABP placed 12/23. S/p HM 2 LVAD placement Oct 13, 2015.   Developed fever 09/29/15 with Tmax 102.5. Pan cultures sent. Switched from zosyn to imipinem. Remains on vanc. 1/2 cultures + for yeast. Seen by ID and fluconazole switched to andulafungin. S/p chest tube for L effusion (09/30/15) = exudative. Got 2u RBCs yesterday  Feels weak. Fevers now coming down. Slept much better last night. Denies dyspnea. PI low when sitting up. CVP 10-11. Creatinine back up 1.9 -> 2.3. Na up to 150.  Remains on Milrinone 0.3, Coox 69% this morning. MAPs 80s   Blood Cx 09/29/15- 1/2 yeast  Urine Cx 09/29/15 - NG UA 09/29/15- with few bacteria  C. Diff 09/29/15 - Negative Resp Culture 09/25/16 - Negative.  Driveline change 09/29/15 with small amount of serous drainage, otherwise unremarkable.   LVAD INTERROGATION:  HeartMate II LVAD:  Flow 5.3 iters/min, speed 9000, power 5.3, PI 3.2 + PI events  Objective:    Vital Signs:   Temp:  [99.1 F (37.3 C)-100.2 F (37.9 C)] 99.6 F (37.6 C) (01/06 0400) Pulse Rate:  [109-116] 109 (01/06 0700) Resp:  [0-32] 22 (01/06 0700) BP: (82-130)/(60-101) 82/60 mmHg (01/06 0700) SpO2:  [95 %-100 %] 97 % (01/06 0700) Weight:  [61.2 kg (134 lb 14.7 oz)] 61.2 kg (134 lb 14.7 oz) (01/06 0605) Last BM Date: 09/30/15 Mean arterial Pressure 80s  Intake/Output:   Intake/Output Summary (Last 24 hours) at 10/02/15 0742 Last data filed at 10/02/15 0700  Gross per 24 hour  Intake 2917.3 ml  Output   3825 ml  Net -907.7 ml     Physical Exam: General: NAD, Elderly appearing. Weak. Sitting in chair HEENT: normal x for  NGT Neck: supple.  JVP 10 cm.  Carotids 2+ bilat; no bruits. No thyromegaly or nodule noted.  Cor: PMI nondisplaced. RRR. LVAD hum present. Left CT Lungs: Decreased bases bilaterally.. Abdomen: soft, NT, ND, no HSM. No bruits or masses. +BS  Extremities: no cyanosis, clubbing, rash. RUE PICC, LUE radial arterial line. Tr-1+ edema in ankles. SCDs present Neuro: alert & orientedx3, cranial nerves grossly intact. moves all 4 extremities w/o difficulty. Affect pleasant  Telemetry: reviewed personally, a sensed v pacing in 110-115, occasional PVCs   Labs: Basic Metabolic Panel:  Recent Labs Lab 09/26/15 0448  09/27/15 0345 09/28/15 0425 09/29/15 0515 09/30/15 0500 10/01/15 0415 10/01/15 1741 10/02/15 0425  NA 146*  < > 144 146* 145 148* 148* 149* 150*  K 4.1  < > 3.6 3.5 3.4* 3.6 3.1* 3.1* 3.6  CL 111  < > 109 113* 111 112* 116* 118* 116*  CO2 22  --  23 22 25 24 24   --  26  GLUCOSE 127*  < > 129* 102* 125* 124* 201* 144* 195*  BUN 20  < > 31* 37* 35* 30* 34* 29* 35*  CREATININE 2.64*  < > 3.20* 3.12* 2.90* 2.61* 2.38* 1.90* 2.32*  CALCIUM 8.8*  --  8.8* 8.9 8.9 8.9 9.0  --  9.0  MG 2.3  --  2.5* 2.4 2.2 2.0  --   --   --   PHOS 4.4  --   --   --   --   --   --   --   --   < > =  values in this interval not displayed.  Liver Function Tests:  Recent Labs Lab 09/27/15 0345 09/28/15 0425 09/29/15 0515 09/30/15 0500 10/01/15 0415  AST 36 29 26 34 91*  ALT 21 17 16* 18 26  ALKPHOS 103 102 115 126 117  BILITOT 1.1 1.0 1.2 1.4* 1.1  PROT 5.3* 5.4* 5.6* 5.8* 5.7*  ALBUMIN 2.2* 2.5* 2.5* 2.3* 2.9*   No results for input(s): LIPASE, AMYLASE in the last 168 hours. No results for input(s): AMMONIA in the last 168 hours.  CBC:  Recent Labs Lab 09/26/15 0448  09/27/15 0345 09/28/15 0425 09/29/15 0515 09/30/15 0500 10/01/15 0830 10/01/15 1740 10/01/15 1741 10/02/15 0425  WBC 15.5*  --  16.2* 12.5* 8.8 14.9* 14.8*  --   --  16.4*  NEUTROABS 12.5*  --  12.9* 9.6* 6.6  11.8*  --   --   --   --   HGB 10.6*  < > 11.0* 10.6* 10.1* 10.5* 7.7* 10.7* 11.2* 10.9*  HCT 29.8*  < > 30.1* 30.6* 28.8* 29.3* 22.2* 30.5* 33.0* 31.2*  MCV 82.5  --  83.1 82.3 82.5 84.4 82.5  --   --  83.2  PLT 255  --  291 311 323 420* 408*  --   --  475*  < > = values in this interval not displayed.  INR:  Recent Labs Lab 09/28/15 0425 09/29/15 0515 09/30/15 0500 10/01/15 0415 10/02/15 0425  INR 2.86* 3.46* 2.56* 2.80* 1.80*    Other results:  EKG:   Imaging: Dg Chest Port 1 View  10/01/2015  CLINICAL DATA:  Status post insertion of LVAD. Left chest tube place yesterday for pleural effusion. EXAM: PORTABLE CHEST 1 VIEW COMPARISON:  09/30/2015 FINDINGS: Left chest tube remains in place without pneumothorax. No significant left pleural fluid identified. Interval placement of feeding tube which extends into the stomach. Right-sided central line, defibrillator and visualized LVAD show stable appearance. Stable to slightly diminished appearance of mild edema. Right pleural effusion appears less prominent. IMPRESSION: No pneumothorax. Stable to slightly diminished edema and decreased prominence of right pleural effusion by chest x-ray. Electronically Signed   By: Irish Lack M.D.   On: 10/01/2015 08:09   Dg Chest Port 1 View  09/30/2015  CLINICAL DATA:  New left chest tube insertion EXAM: PORTABLE CHEST 1 VIEW COMPARISON:  Earlier today FINDINGS: New left-sided chest tube with tip at the apex. Left pleural fluid has been drained with deep sulcus sign and left lower lobe atelectasis. This may reflect trapped lung. No indication of re-expansion pulmonary edema. Layering at least moderate right pleural effusion is stable. Stable cardiopericardial enlargement in this patient with partially visualized LVAD. Biventricular ICD/ pacer leads from the left. Right subclavian central line, tip at the upper cavoatrial junction. IMPRESSION: 1. New left chest tube with drained pleural effusion. Left  basilar pneumothorax with possible trapped lung. 2. Remainder of the chest is stable from earlier today. Electronically Signed   By: Marnee Spring M.D.   On: 09/30/2015 09:39   Dg Abd Portable 1v  09/30/2015  CLINICAL DATA:  Feeding tube placement. EXAM: PORTABLE ABDOMEN - 1 VIEW COMPARISON:  CT scan dated 09/22/2015 FINDINGS: Feeding tube tip appears to be in the duodenal bulb region. New left ventricular assist device in place. Bowel gas pattern is normal. IMPRESSION: Feeding tube tip appears to be in the duodenal bulb. Electronically Signed   By: Francene Boyers M.D.   On: 09/30/2015 14:42     Medications:  Scheduled Medications: . albumin human  12.5 g Intravenous Q6H  . amiodarone  200 mg Oral Daily  . anidulafungin  100 mg Intravenous Q24H  . aspirin  81 mg Oral Daily  . docusate sodium  200 mg Oral Daily  . feeding supplement (ENSURE ENLIVE)  237 mL Oral TID BM  . feeding supplement (VITAL 1.5 CAL)  1,000 mL Per Tube Q24H  . hydrALAZINE  25 mg Oral 3 times per day  . imipenem-cilastatin  250 mg Intravenous Q8H  . insulin aspart  0-5 Units Subcutaneous QHS  . insulin aspart  0-9 Units Subcutaneous TID WC  . metoCLOPramide (REGLAN) injection  5 mg Intravenous 3 times per day  . pantoprazole  40 mg Oral Daily  . sildenafil  40 mg Oral TID  . sodium chloride  10 mL Intravenous Q12H  . vancomycin  750 mg Intravenous Q24H  . warfarin  2.5 mg Oral q1800  . Warfarin - Physician Dosing Inpatient   Does not apply q1800    Infusions: . sodium chloride Stopped (09/25/15 1100)  . sodium chloride 250 mL (10/02/15 0700)  . EPINEPHrine 4 mg in dextrose 5% 250 mL infusion (16 mcg/mL) Stopped (09/27/15 1100)  . lactated ringers Stopped (09/26/15 0000)  . lactated ringers Stopped (09/24/15 0700)  . milrinone 0.3 mcg/kg/min (10/02/15 0700)    PRN Medications: sodium chloride, Gerhardt's butt cream, hydrALAZINE, ondansetron (ZOFRAN) IV, oxyCODONE, sodium chloride   Assessment/Plan    1. Acute on chronic systolic HF -> Cardiogenic shock: NICM with EF 5%, mild to moderate RV hypokinesis.  S/p St Jude CRT-D. s/p HM II LVAD placement 08/27/2015. Co-ox 65.4%. Creatinine trending down.  - Stable from cardiac standpoint on milrinone 0.3. Co-ox 69%. Can decrease to 0. - Weight is up but mostly due to third spacing. Renal function worse and NA up. Will hold lasix and give start low-volume D5 1/2 NS 2. Atrial fibrillation: Paroxysmal.    - Maintaining sinus  - On coumadin and po amiodarone 3. 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   - 3-5 beat NSVT runs on tele several days ago.  K 3.6. Will supp. 4. AKI on CKD: Creatinine 1.9 prior to admission.  - Worse today. Hold lasix. Gentle hydration.  5. Pulmonary HTN:   - Continue milrinone and sildenafil 40 tid. 6. Anticoagulation:  - INR 2.86 => 3.46 => 2.56.=> 2.8 => 1.8 - On ASA 81 mg daily.   7. ID: Now with fungemia  -  Remains febrile on vanc/imipenem. ID consulted. Now andulafungin -   R transudative effusion s/p chest tube 1/4  8. Hypernatremia - stop lasix. Give D5 8.Conditioning - Rehab slowed down due to infection. Recommend CIR.   Remains very tenuous. From heart and kidney perspective doing ok. Main issues now seems to be fungemia and severe deconditioning. Continue broad spectrum abx and anti-fungals. Hold diuretics. Gentle hydration.   I reviewed the LVAD parameters from today, and compared the results to the patient's prior recorded data.  No programming changes were made.  The LVAD is functioning within specified parameters. LVAD interrogation was negative for any significant power changes, alarms or PI events/speed drops.  LVAD equipment check completed and is in good working order.  Back-up equipment present.    The patient is critically ill with multiple organ systems failure and requires high complexity decision making for assessment and support, frequent evaluation and titration of therapies, application of advanced monitoring  technologies and extensive interpretation of multiple databases.   Critical Care Time  devoted to patient care services described in this note is 35 Minutes.   Length of Stay: 16  Arvilla Meres MD  10/02/2015, 7:42 AM  VAD Team --- VAD ISSUES ONLY--- Pager [phone] (7am - 7am)

## 2015-10-03 ENCOUNTER — Inpatient Hospital Stay (HOSPITAL_COMMUNITY): Payer: Medicare Other

## 2015-10-03 DIAGNOSIS — Z95811 Presence of heart assist device: Secondary | ICD-10-CM

## 2015-10-03 DIAGNOSIS — I5043 Acute on chronic combined systolic (congestive) and diastolic (congestive) heart failure: Secondary | ICD-10-CM

## 2015-10-03 LAB — GLUCOSE, CAPILLARY
GLUCOSE-CAPILLARY: 171 mg/dL — AB (ref 65–99)
GLUCOSE-CAPILLARY: 205 mg/dL — AB (ref 65–99)
Glucose-Capillary: 110 mg/dL — ABNORMAL HIGH (ref 65–99)
Glucose-Capillary: 184 mg/dL — ABNORMAL HIGH (ref 65–99)

## 2015-10-03 LAB — CULTURE, RESPIRATORY W GRAM STAIN: Special Requests: NORMAL

## 2015-10-03 LAB — POCT I-STAT, CHEM 8
BUN: 37 mg/dL — AB (ref 6–20)
Calcium, Ion: 1.22 mmol/L (ref 1.13–1.30)
Chloride: 113 mmol/L — ABNORMAL HIGH (ref 101–111)
Creatinine, Ser: 2 mg/dL — ABNORMAL HIGH (ref 0.61–1.24)
Glucose, Bld: 169 mg/dL — ABNORMAL HIGH (ref 65–99)
HEMATOCRIT: 35 % — AB (ref 39.0–52.0)
HEMOGLOBIN: 11.9 g/dL — AB (ref 13.0–17.0)
Potassium: 4 mmol/L (ref 3.5–5.1)
SODIUM: 149 mmol/L — AB (ref 135–145)
TCO2: 24 mmol/L (ref 0–100)

## 2015-10-03 LAB — CBC
HCT: 31.2 % — ABNORMAL LOW (ref 39.0–52.0)
Hemoglobin: 10.6 g/dL — ABNORMAL LOW (ref 13.0–17.0)
MCH: 28.4 pg (ref 26.0–34.0)
MCHC: 34 g/dL (ref 30.0–36.0)
MCV: 83.6 fL (ref 78.0–100.0)
PLATELETS: 572 10*3/uL — AB (ref 150–400)
RBC: 3.73 MIL/uL — ABNORMAL LOW (ref 4.22–5.81)
RDW: 15.8 % — AB (ref 11.5–15.5)
WBC: 14.7 10*3/uL — ABNORMAL HIGH (ref 4.0–10.5)

## 2015-10-03 LAB — PROTIME-INR
INR: 1.69 — ABNORMAL HIGH (ref 0.00–1.49)
Prothrombin Time: 19.9 seconds — ABNORMAL HIGH (ref 11.6–15.2)

## 2015-10-03 LAB — BASIC METABOLIC PANEL
Anion gap: 11 (ref 5–15)
BUN: 38 mg/dL — ABNORMAL HIGH (ref 6–20)
CO2: 26 mmol/L (ref 22–32)
Calcium: 9.3 mg/dL (ref 8.9–10.3)
Chloride: 114 mmol/L — ABNORMAL HIGH (ref 101–111)
Creatinine, Ser: 2.25 mg/dL — ABNORMAL HIGH (ref 0.61–1.24)
GFR calc Af Amer: 31 mL/min — ABNORMAL LOW (ref >60)
GFR calc non Af Amer: 27 mL/min — ABNORMAL LOW (ref >60)
Glucose, Bld: 220 mg/dL — ABNORMAL HIGH (ref 65–99)
Potassium: 3.4 mmol/L — ABNORMAL LOW (ref 3.5–5.1)
Sodium: 151 mmol/L — ABNORMAL HIGH (ref 135–145)

## 2015-10-03 LAB — CARBOXYHEMOGLOBIN
Carboxyhemoglobin: 2.2 % — ABNORMAL HIGH (ref 0.5–1.5)
Methemoglobin: 1.1 % (ref 0.0–1.5)
O2 Saturation: 68.8 %
Total hemoglobin: 11 g/dL — ABNORMAL LOW (ref 13.5–18.0)

## 2015-10-03 LAB — LACTATE DEHYDROGENASE: LDH: 392 U/L — ABNORMAL HIGH (ref 98–192)

## 2015-10-03 MED ORDER — SULFAMETHOXAZOLE-TRIMETHOPRIM 400-80 MG/5ML IV SOLN
320.0000 mg | Freq: Two times a day (BID) | INTRAVENOUS | Status: DC
  Administered 2015-10-03 – 2015-10-06 (×6): 320 mg via INTRAVENOUS
  Filled 2015-10-03 (×9): qty 20

## 2015-10-03 MED ORDER — POTASSIUM CHLORIDE 2 MEQ/ML IV SOLN
INTRAVENOUS | Status: DC
  Administered 2015-10-03: 11:00:00 via INTRAVENOUS
  Filled 2015-10-03: qty 1000

## 2015-10-03 MED ORDER — WARFARIN SODIUM 3 MG PO TABS
4.0000 mg | ORAL_TABLET | Freq: Every day | ORAL | Status: DC
  Administered 2015-10-03 – 2015-10-04 (×2): 4 mg via ORAL
  Filled 2015-10-03 (×2): qty 0.5

## 2015-10-03 MED ORDER — FUROSEMIDE 10 MG/ML IJ SOLN
40.0000 mg | Freq: Once | INTRAMUSCULAR | Status: AC
  Administered 2015-10-03: 40 mg via INTRAVENOUS
  Filled 2015-10-03: qty 4

## 2015-10-03 MED ORDER — POTASSIUM CHLORIDE 10 MEQ/50ML IV SOLN
10.0000 meq | INTRAVENOUS | Status: AC
  Administered 2015-10-03 (×3): 10 meq via INTRAVENOUS
  Filled 2015-10-03 (×3): qty 50

## 2015-10-03 NOTE — Progress Notes (Signed)
Patient ID: Brad [name], male   DOB: 24-Jan-1941, 75 y.o.   MRN: [id number]    Advanced Heart Failure Rounding Note HeartMate 2 Rounding Note  Subjective:    Admitted from [facility] with cardiogenic shock for advanced heart failure as INTERMACS-1.  On 12/22 foley placed by Urology requiring urethral dilation -> UTI . ? Septic shock. Placed on vanco/zosyn. IABP placed 12/23. S/p HM 2 LVAD placement Oct 13, 2015.   Developed fever 09/29/15.Pan cultures sent. Switched from zosyn to imipinem. 1/2 cultures + for yeast. Seen by ID and fluconazole switched to andulafungin on 1/5. Rare GNR and GPC in sputum. Vanc stopped 1/6.  S/p chest tube for L effusion (09/30/15) = exudative.   No further fevers. WBC coming down. Feeling better but still weak. Denies dyspnea. Received 1L D5 1/2NS for low PI and hypernatremia yesterday. Creatinine slightly improved to 2.3. Na up to 151. No further diarrhea  Milrinone turned down to 0.25 yesterday. Coox 69% this morning. MAPs 90s. CVP 8   Blood Cx 09/29/15- 1/2 yeast  Urine Cx 09/29/15 - NG UA 09/29/15- with few bacteria  C. Diff 09/29/15 - Negative Resp Culture 09/25/16 - Negative. Resp Culture 09/30/15 -R are GNR/GPC  Driveline change 09/29/15 with small amount of serous drainage, otherwise unremarkable.   LVAD INTERROGATION:  HeartMate II LVAD:  Flow 6.9l iters/min, speed 9000, power 6.2, PI  6.1  Objective:    Vital Signs:   Temp:  [98.7 F (37.1 C)-99.7 F (37.6 C)] 99.4 F (37.4 C) (01/07 0808) Pulse Rate:  [102-112] 107 (01/07 0800) Resp:  [19-31] 24 (01/07 0800) BP: (87-117)/(72-91) 103/88 mmHg (01/07 0800) SpO2:  [94 %-100 %] 97 % (01/07 0800) Weight:  [62 kg (136 lb 11 oz)] 62 kg (136 lb 11 oz) (01/07 0600) Last BM Date: 10/02/15 Mean arterial Pressure 90s  Intake/Output:   Intake/Output Summary (Last 24 hours) at 10/03/15 0917 Last data filed at 10/03/15 0829  Gross per 24 hour  Intake 2687.8 ml  Output   2500 ml  Net  187.8 ml     Physical  Exam: General: NAD, Elderly appearing. Weak. Sitting in chair HEENT: normal x for NGT Neck: supple.  JVP 8-10 cm.  Carotids 2+ bilat; no bruits. No thyromegaly or nodule noted.  Cor: PMI nondisplaced. RRR. LVAD hum present. Left CT Lungs: Decreased bases bilaterally.. Abdomen: soft, NT, ND, no HSM. No bruits or masses. +BS  Extremities: no cyanosis, clubbing, rash. RUE PICC, LUE radial arterial line. Tr-1+ edema in ankles. SCDs present Neuro: alert & orientedx3, cranial nerves grossly intact. moves all 4 extremities w/o difficulty. Affect pleasant  Telemetry: reviewed personally, a sensed v pacing in 110-115, occasional PVCs   Labs: Basic Metabolic Panel:  Recent Labs Lab 09/27/15 0345 09/28/15 0425 09/29/15 0515 09/30/15 0500 10/01/15 0415 10/01/15 1741 10/02/15 0425 10/03/15 0455  NA 144 146* 145 148* 148* 149* 150* 151*  K 3.6 3.5 3.4* 3.6 3.1* 3.1* 3.6 3.4*  CL 109 113* 111 112* 116* 118* 116* 114*  CO2 23 22 25 24 24   --  26 26  GLUCOSE 129* 102* 125* 124* 201* 144* 195* 220*  BUN 31* 37* 35* 30* 34* 29* 35* 38*  CREATININE 3.20* 3.12* 2.90* 2.61* 2.38* 1.90* 2.32* 2.25*  CALCIUM 8.8* 8.9 8.9 8.9 9.0  --  9.0 9.3  MG 2.5* 2.4 2.2 2.0  --   --   --   --     Liver Function Tests:  Recent Labs Lab 09/27/15 0345 09/28/15  0425 09/29/15 0515 09/30/15 0500 10/01/15 0415  AST 36 29 26 34 91*  ALT 21 17 16* 18 26  ALKPHOS 103 102 115 126 117  BILITOT 1.1 1.0 1.2 1.4* 1.1  PROT 5.3* 5.4* 5.6* 5.8* 5.7*  ALBUMIN 2.2* 2.5* 2.5* 2.3* 2.9*   No results for input(s): LIPASE, AMYLASE in the last 168 hours. No results for input(s): AMMONIA in the last 168 hours.  CBC:  Recent Labs Lab 09/27/15 0345 09/28/15 0425 09/29/15 0515 09/30/15 0500 10/01/15 0830 10/01/15 1740 10/01/15 1741 10/02/15 0425 10/03/15 0455  WBC 16.2* 12.5* 8.8 14.9* 14.8*  --   --  16.4* 14.7*  NEUTROABS 12.9* 9.6* 6.6 11.8*  --   --   --   --   --   HGB 11.0* 10.6* 10.1* 10.5* 7.7* 10.7*  11.2* 10.9* 10.6*  HCT 30.1* 30.6* 28.8* 29.3* 22.2* 30.5* 33.0* 31.2* 31.2*  MCV 83.1 82.3 82.5 84.4 82.5  --   --  83.2 83.6  PLT 291 311 323 420* 408*  --   --  475* 572*    INR:  Recent Labs Lab 09/29/15 0515 09/30/15 0500 10/01/15 0415 10/02/15 0425 10/03/15 0455  INR 3.46* 2.56* 2.80* 1.80* 1.69*    Other results:  EKG:   Imaging: Dg Chest Port 1 View  10/03/2015  CLINICAL DATA:  Short of breath for 1 day EXAM: PORTABLE CHEST 1 VIEW COMPARISON:  Yesterday FINDINGS: Feeding tube stable. Left chest tube removed without ensuing pneumothorax. Left subclavian AICD device and leads are stable. Right subclavian central venous catheter stable. Severe cardiomegaly unchanged. Left pleural effusion and basilar pulmonary opacity are stable. Right pleural effusion and basilar opacity are improved. LVAD device stable. Vascular congestion has improved. IMPRESSION: Left chest tube removed without pneumothorax. Improved vascular congestion. Stable left basilar consolidation and pleural effusion. Improved right basilar consolidation and pleural effusion Electronically Signed   By: Jolaine Click M.D.   On: 10/03/2015 09:00   Dg Chest Port 1 View  10/02/2015  CLINICAL DATA:  75 year old male with left ventricular assist device. Initial encounter. EXAM: PORTABLE CHEST 1 VIEW COMPARISON:  10/01/2015 and earlier. FINDINGS: Portable AP upright view at 0648 hours. Enteric feeding tube remains in place. Left chest tube remains. Right subclavian central line is stable. Left side cardiac AICD and LVAD remain in place. No pneumothorax. Stable pulmonary vascularity without overt edema. Bilateral veiling pulmonary opacity at both lung bases appears stable. Stable cardiomegaly and mediastinal contours. IMPRESSION: 1.  Stable lines and tubes. 2. Stable ventilation with bilateral pleural effusions. No pneumothorax or edema. Electronically Signed   By: Odessa Fleming M.D.   On: 10/02/2015 08:04     Medications:      Scheduled Medications: . amiodarone  200 mg Oral Daily  . anidulafungin  100 mg Intravenous Q24H  . aspirin  81 mg Oral Daily  . docusate sodium  200 mg Oral Daily  . feeding supplement (ENSURE ENLIVE)  237 mL Oral TID BM  . feeding supplement (VITAL 1.5 CAL)  1,000 mL Per Tube Q24H  . hydrALAZINE  25 mg Oral 3 times per day  . imipenem-cilastatin  250 mg Intravenous Q8H  . insulin aspart  0-5 Units Subcutaneous QHS  . insulin aspart  0-9 Units Subcutaneous TID WC  . metoCLOPramide (REGLAN) injection  5 mg Intravenous 3 times per day  . pantoprazole  40 mg Oral Daily  . potassium chloride  10 mEq Intravenous Q1 Hr x 3  . sildenafil  40  mg Oral TID  . sodium chloride  10 mL Intravenous Q12H  . warfarin  4 mg Oral q1800  . Warfarin - Physician Dosing Inpatient   Does not apply q1800    Infusions: . sodium chloride Stopped (09/25/15 1100)  . sodium chloride 250 mL (10/03/15 0800)  . dextrose 5 % and 0.45 % NaCl with KCl 20 mEq/L 40 mL/hr at 10/03/15 0800  . EPINEPHrine 4 mg in dextrose 5% 250 mL infusion (16 mcg/mL) Stopped (09/27/15 1100)  . lactated ringers Stopped (09/26/15 0000)  . lactated ringers Stopped (09/24/15 0700)  . milrinone 0.25 mcg/kg/min (10/03/15 0800)    PRN Medications: sodium chloride, Gerhardt's butt cream, hydrALAZINE, ondansetron (ZOFRAN) IV, oxyCODONE, sodium chloride   Assessment/Plan   1. Acute on chronic systolic HF -> Cardiogenic shock: NICM with EF 5%, mild to moderate RV hypokinesis.  S/p St Jude CRT-D. s/p HM II LVAD placement 09/13/2015. Co-ox 65.4%. Creatinine trending down.  - Stable from cardiac standpoint on milrinone 0.25. Co-ox 69%. Can decrease to 0.20 - Weight up 20 pounds from baseline and remains edematous. However has worsening hypernatremia and renal function still up. Will give a liter of D5W slowly. Encourage free water intake. May need to use DDVAP. Will give one dose lasix.   2. Atrial fibrillation: Paroxysmal.    - Maintaining  sinus  - On coumadin and po amiodarone 3. 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   - 3-5 beat NSVT runs on tele several days ago.  K 3.4. Will supp. 4. AKI on CKD: Creatinine 1.9 prior to admission.  - Slightly improved today. See above 5. Pulmonary HTN:   - Continue milrinone and sildenafil 40 tid. 6. Anticoagulation:  - INR 2.86 => 3.46 => 2.56.=> 2.8 => 1.8 => 1.7. Warfarin adjusted - On ASA 81 mg daily.   7. ID: Now with fungemia  -  Improved on imipenem. ID consulted. Now andulafungin -   R transudative effusion s/p chest tube 1/4  8. Hypernatremia -Treat with D5W. Encourage free water intake. May need DDVAP 8.Conditioning - Rehab slowed down due to infection. Recommend CIR. Will place consult Monday.    I reviewed the LVAD parameters from today, and compared the results to the patient's prior recorded data.  No programming changes were made.  The LVAD is functioning within specified parameters. LVAD interrogation was negative for any significant power changes, alarms or PI events/speed drops.  LVAD equipment check completed and is in good working order.  Back-up equipment present.    The patient is critically ill with multiple organ systems failure and requires high complexity decision making for assessment and support, frequent evaluation and titration of therapies, application of advanced monitoring technologies and extensive interpretation of multiple databases.   Critical Care Time devoted to patient care services described in this note is 45 Minutes.   Length of Stay: 17  Arvilla Meres MD  10/03/2015, 9:17 AM  VAD Team --- VAD ISSUES ONLY--- Pager [phone] (7am - 7am)

## 2015-10-03 NOTE — Progress Notes (Signed)
ANTIBIOTIC CONSULT NOTE - INITIAL  Pharmacy Consult for Bactrim Indication: Respiratory STENOTROPHOMONAS  No Known Allergies  Patient Measurements: Height: 5\' 7"  (170.2 cm) Weight: 136 lb 11 oz (62 kg) IBW/kg (Calculated) : 66.1 Adjusted Body Weight:   Vital Signs: Temp: 98.9 F (37.2 C) (01/07 1159) Temp Source: Oral (01/07 1159) BP: 99/78 mmHg (01/07 1300) Pulse Rate: 113 (01/07 1300) Intake/Output from previous day: 01/06 0701 - 01/07 0700 In: 2763.3 [P.O.:120; I.V.:1231.3; NG/GT:782; IV Piggyback:630] Out: 2750 [Urine:2750] Intake/Output from this shift: Total I/O In: 1113 [P.O.:360; I.V.:313; NG/GT:60; IV Piggyback:380] Out: 400 [Urine:400]  Labs:  Recent Labs  10/01/15 0830  10/01/15 1741 10/02/15 0425 10/03/15 0455  WBC 14.8*  --   --  16.4* 14.7*  HGB 7.7*  < > 11.2* 10.9* 10.6*  PLT 408*  --   --  475* 572*  CREATININE  --   --  1.90* 2.32* 2.25*  < > = values in this interval not displayed. Estimated Creatinine Clearance: 25.3 mL/min (by C-G formula based on Cr of 2.25). No results for input(s): VANCOTROUGH, VANCOPEAK, VANCORANDOM, GENTTROUGH, GENTPEAK, GENTRANDOM, TOBRATROUGH, TOBRAPEAK, TOBRARND, AMIKACINPEAK, AMIKACINTROU, AMIKACIN in the last 72 hours.   Microbiology: Recent Results (from the past 720 hour(s))  MRSA PCR Screening     Status: None   Collection Time: 09/02/2015  3:52 PM  Result Value Ref Range Status   MRSA by PCR NEGATIVE NEGATIVE Final    Comment:        The GeneXpert MRSA Assay (FDA approved for NASAL specimens only), is one component of a comprehensive MRSA colonization surveillance program. It is not intended to diagnose MRSA infection nor to guide or monitor treatment for MRSA infections.   Culture, Urine     Status: None   Collection Time: 09/17/15  8:11 AM  Result Value Ref Range Status   Specimen Description URINE, CATHETERIZED  Final   Special Requests NONE  Final   Culture 3,000 COLONIES/mL INSIGNIFICANT GROWTH   Final   Report Status 09/24/2015 FINAL  Final  Culture, blood (Routine X 2) w Reflex to ID Panel     Status: None   Collection Time: 09/17/15  3:05 PM  Result Value Ref Range Status   Specimen Description BLOOD LEFT ARM  Final   Special Requests BOTTLES DRAWN AEROBIC ONLY 7CC  Final   Culture NO GROWTH 5 DAYS  Final   Report Status 09/22/2015 FINAL  Final  Culture, blood (Routine X 2) w Reflex to ID Panel     Status: None   Collection Time: 09/17/15  3:10 PM  Result Value Ref Range Status   Specimen Description BLOOD LEFT HAND  Final   Special Requests BOTTLES DRAWN AEROBIC ONLY 6CC  Final   Culture NO GROWTH 5 DAYS  Final   Report Status 09/22/2015 FINAL  Final  Surgical pcr screen     Status: None   Collection Time: 09/21/15  8:47 AM  Result Value Ref Range Status   MRSA, PCR NEGATIVE NEGATIVE Final   Staphylococcus aureus NEGATIVE NEGATIVE Final    Comment:        The Xpert SA Assay (FDA approved for NASAL specimens in patients over 42 years of age), is one component of a comprehensive surveillance program.  Test performance has been validated by [facility] for patients greater than or equal to 10 year old. It is not intended to diagnose infection nor to guide or monitor treatment.   Culture, respiratory (NON-Expectorated)     Status: None  Collection Time: 09/26/15  9:00 AM  Result Value Ref Range Status   Specimen Description TRACHEAL ASPIRATE  Final   Special Requests Normal  Final   Gram Stain   Final    RARE WBC PRESENT,BOTH PMN AND MONONUCLEAR MODERATE SQUAMOUS EPITHELIAL CELLS PRESENT NO ORGANISMS SEEN Performed at [facility]    Culture   Final    NO GROWTH 2 DAYS Performed at [facility]    Report Status 09/29/2015 FINAL  Final  Culture, blood (single)     Status: None (Preliminary result)   Collection Time: 09/29/15  8:30 AM  Result Value Ref Range Status   Specimen Description BLOOD PICC LINE  Final   Special Requests BOTTLES  DRAWN AEROBIC AND ANAEROBIC 10CC  Final   Culture NO GROWTH 4 DAYS  Final   Report Status PENDING  Incomplete  Urine culture     Status: None   Collection Time: 09/29/15 10:00 AM  Result Value Ref Range Status   Specimen Description URINE, CATHETERIZED  Final   Special Requests NONE  Final   Culture NO GROWTH 1 DAY  Final   Report Status 09/30/2015 FINAL  Final  Culture, blood (single)     Status: None (Preliminary result)   Collection Time: 09/29/15 11:45 AM  Result Value Ref Range Status   Specimen Description BLOOD LEFT HAND  Final   Special Requests IN PEDIATRIC BOTTLE 3CC  Final   Culture  Setup Time   Final    YEAST IN PEDIATRIC BOTTLE CRITICAL RESULT CALLED TO, READ BACK BY AND VERIFIED WITH: N SARHAM  10/01/15 MKELLY    Culture YEAST IDENTIFICATION TO FOLLOW   Final   Report Status PENDING  Incomplete  C difficile quick scan w PCR reflex     Status: None   Collection Time: 09/29/15  1:37 PM  Result Value Ref Range Status   C Diff antigen NEGATIVE NEGATIVE Final   C Diff toxin NEGATIVE NEGATIVE Final   C Diff interpretation Negative for toxigenic C. difficile  Final  Culture, respiratory (NON-Expectorated)     Status: None   Collection Time: 09/30/15  9:27 AM  Result Value Ref Range Status   Specimen Description INDUCED SPUTUM  Final   Special Requests Normal  Final   Gram Stain   Final    MODERATE WBC PRESENT, PREDOMINANTLY PMN MODERATE SQUAMOUS EPITHELIAL CELLS PRESENT RARE GRAM POSITIVE COCCI IN PAIRS RARE YEAST RARE GRAM NEGATIVE RODS    Culture   Final    FEW STENOTROPHOMONAS MALTOPHILIA Performed at [facility]    Report Status 10/03/2015 FINAL  Final   Organism ID, Bacteria STENOTROPHOMONAS MALTOPHILIA  Final      Susceptibility   Stenotrophomonas maltophilia - MIC*    TRIMETH/SULFA Value in next row Sensitive      <=20 SENSITIVE(NOTE)    LEVOFLOXACIN Value in next row Sensitive      <=20 SENSITIVE(NOTE)    * FEW STENOTROPHOMONAS  MALTOPHILIA  Culture, body fluid-bottle     Status: None (Preliminary result)   Collection Time: 09/30/15 10:21 AM  Result Value Ref Range Status   Specimen Description FLUID PLEURAL LEFT  Final   Special Requests NONE  Final   Culture NO GROWTH 3 DAYS  Final   Report Status PENDING  Incomplete  Gram stain     Status: None   Collection Time: 09/30/15 10:21 AM  Result Value Ref Range Status   Specimen Description FLUID PLEURAL LEFT  Final  Special Requests NONE  Final   Gram Stain   Final    MODERATE WBC PRESENT,BOTH PMN AND MONONUCLEAR NO ORGANISMS SEEN    Report Status 09/30/2015 FINAL  Final  Culture, blood (routine x 2)     Status: None (Preliminary result)   Collection Time: 10/01/15  9:55 AM  Result Value Ref Range Status   Specimen Description BLOOD LEFT WRIST  Final   Special Requests BOTTLES DRAWN AEROBIC AND ANAEROBIC 5CC  Final   Culture NO GROWTH 2 DAYS  Final   Report Status PENDING  Incomplete  Culture, blood (routine x 2)     Status: None (Preliminary result)   Collection Time: 10/01/15 10:03 AM  Result Value Ref Range Status   Specimen Description BLOOD LEFT HAND  Final   Special Requests IN PEDIATRIC BOTTLE 3CC  Final   Culture NO GROWTH 2 DAYS  Final   Report Status PENDING  Incomplete    Medical History: Past Medical History  Diagnosis Date  . 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 CKD (chronic kidney disease) stage 3, GFR 30-59 ml/min   . V-tach (HCC)   . PAF (paroxysmal atrial fibrillation) (HCC)   . CVA (cerebral infarction)   . BPH (benign prostatic hyperplasia)   . HTN (hypertension), benign   . Hyperlipidemia     Assessment: Patient is 75 yo with h/o PAF, CVA, HTN, heart falilure.  He has been on Eliquis PTA. New LVAD placed 12/28  Anticoagulation: Eliquis pta for PAF, VAD placed 12/28. MD dosing warfarin. GOAL INR 2-2.5. INR 2.8-->1.69 today, Coumadin 4mg  po x 1 today per MD (Bactrim added for  STENOTROPHOMONAS)   Infectious Disease: Vanc/Zosyn for post op LVAD abx with planned stop date 1/4. Zosyn changed to imipenem on 1/3 due to fever 102.5, continued fevers on vanc/imipenem so fluconazole added 1/4 - now with yeast in blood - consult ID - change to Anidulafungin. STENOTROPHOMONAS in sputum culture. Bactrim preferred. WBC up 8.8>14.6>16.4>14.7, Tmax 99.4 last night  12/22 UA>>dirty, many bacteria, nitrites, leukocytes  12/22 PO Septra x 1 dose, then decompensated so broadened to zosyn 12/22 Zosyn >> 1/3 1/3 Imipenem >>1/7 12/23 Vanc >> 1/6 1/4 Fluconazole>>1/5 1/5 Anidulafungin>> 1/7 Septra>> 12/29 rifampin x 1 dose  1/5 blood x2>> 1/4 resp >>STENOTROPHOMONAS MALTOPHILIA  S Levaquin and Sulfa 1/4 pleural fluid>> ngtd 1/3 urine >> negative 1/3 blood x 2 >> 1/2 yeast yet unidentified 12/31 resp cx>> negative 12/22 Bcx ng final 12/22 Ucx insignif growth final MRSA PCR neg  12/30 VT 35 12/31 VR 37 1/1 VR 26 (Will likely not need further doses due to pt kinetics and lvl should stay above 10) 1/2 VR 20 (received vancomycin 1000mg  x 1) 1/3 received vancomycin 1000mg  x 1 1/4 VR = 22, schedule 750mg  q24 to start @ 2200   Goal of Therapy:  Eradication of infection  Plan:  Septra 5mg /kg IV q12 hrs for CrCl 25 = Septra 320mg  IV q12h **Monitor major drug interaction with Coumadin**  Isabel Freese S. Merilynn Finland, PharmD, BCPS Clinical Staff Pharmacist Pager [phone]  Misty Stanley Stillinger 10/03/2015,2:34 PM

## 2015-10-03 NOTE — Progress Notes (Addendum)
Patient ID: Brad [name], male   DOB: [birth year], 75 y.o.   MRN: [id number]         [facility]    Date of Admission:  09/10/2015   Total days of antibiotics 17        Day 5 imipenem        Day 3 anidulafungin  Principal Problem:   Fungemia Active Problems:   Acute on chronic systolic and diastolic heart failure, NYHA class 4 (HCC)   Cardiogenic shock (HCC)   Acute renal failure superimposed on stage 3 chronic kidney disease (HCC)   PAF (paroxysmal atrial fibrillation) (HCC)   PAH (pulmonary artery hypertension) (HCC)   Dyspnea   Hypotension   Palliative care encounter   CHF (congestive heart failure) (HCC)   LVAD (left ventricular assist device) present (HCC)   AKI (acute kidney injury) (HCC)   Automatic implantable cardioverter-defibrillator in situ ID: 75yo M with cardiogenic shock for advanced heart failure s/p HM 2 LVAD placement 09/22/2015 c/b POD#6 fever 09/29/15.found to have fungemia in 1/2 bcx also has GNR from sputum cx, and exudative L- effusion. currenting on imipenem and  andulafungin   . amiodarone  200 mg Oral Daily  . anidulafungin  100 mg Intravenous Q24H  . aspirin  81 mg Oral Daily  . docusate sodium  200 mg Oral Daily  . feeding supplement (ENSURE ENLIVE)  237 mL Oral TID BM  . feeding supplement (VITAL 1.5 CAL)  1,000 mL Per Tube Q24H  . furosemide  40 mg Intravenous Once  . hydrALAZINE  25 mg Oral 3 times per day  . imipenem-cilastatin  250 mg Intravenous Q8H  . insulin aspart  0-5 Units Subcutaneous QHS  . insulin aspart  0-9 Units Subcutaneous TID WC  . pantoprazole  40 mg Oral Daily  . potassium chloride  10 mEq Intravenous Q1 Hr x 3  . sildenafil  40 mg Oral TID  . sodium chloride  10 mL Intravenous Q12H  . warfarin  4 mg Oral q1800  . Warfarin - Physician Dosing Inpatient   Does not apply q1800    SUBJECTIVE: Afebrile, patient feeling fatigue after being up in chair for 3 hrs  Interval events: left chest tube  removed, daily cxr shows still effusion to left, and congestion  Review of Systems: Review of Systems  Constitutional: Positive for malaise/fatigue. Negative for fever, chills and diaphoresis.  Respiratory: Negative for cough and sputum production.   Neurological: Positive for weakness.    Past Medical History  Diagnosis Date  . 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 CKD (chronic kidney disease) stage 3, GFR 30-59 ml/min   . V-tach (HCC)   . PAF (paroxysmal atrial fibrillation) (HCC)   . CVA (cerebral infarction)   . BPH (benign prostatic hyperplasia)   . HTN (hypertension), benign   . Hyperlipidemia     Social History  Substance Use Topics  . Smoking status: Never Smoker   . Smokeless tobacco: None  . Alcohol Use: No    Family History  Problem Relation Age of Onset  . Hypertension Mother   . CVA Maternal Aunt    No Known Allergies  OBJECTIVE: Filed Vitals:   10/03/15 0700 10/03/15 0800 10/03/15 0808 10/03/15 0900  BP: 116/90 103/88  117/86  Pulse: 108 107  110  Temp:   99.4 F (37.4 C)   TempSrc:   Oral   Resp: 28  24  29  Height:      Weight:      SpO2: 96% 97%  93%   Body mass index is 21.4 kg/(m^2).  Physical Exam  Constitutional: He is oriented to person, place, and time.  Upon entering the room he was asleep in a chair but aroused easily. He is smiling and in good spirits today. He looks like he is feeling better.  Eyes: Conjunctivae are normal.  Cardiovascular: Normal rate and regular rhythm.   No murmur heard. LVAD hum noted.  Pulmonary/Chest:  Clear breath sounds anteriorly. Incisions look good.  Abdominal: Soft. He exhibits no mass. There is no tenderness.  Musculoskeletal: Normal range of motion.  Neurological: He is alert and oriented to person, place, and time.  Skin: No rash noted.  Psychiatric: Mood and affect normal.    Lab Results Lab Results  Component Value Date   WBC 14.7* 10/03/2015   HGB  10.6* 10/03/2015   HCT 31.2* 10/03/2015   MCV 83.6 10/03/2015   PLT 572* 10/03/2015    Lab Results  Component Value Date   CREATININE 2.25* 10/03/2015   BUN 38* 10/03/2015   NA 151* 10/03/2015   K 3.4* 10/03/2015   CL 114* 10/03/2015   CO2 26 10/03/2015    Lab Results  Component Value Date   ALT 26 10/01/2015   AST 91* 10/01/2015   ALKPHOS 117 10/01/2015   BILITOT 1.1 10/01/2015     Microbiology: Recent Results (from the past 240 hour(s))  Culture, respiratory (NON-Expectorated)     Status: None   Collection Time: 09/26/15  9:00 AM  Result Value Ref Range Status   Specimen Description TRACHEAL ASPIRATE  Final   Special Requests Normal  Final   Gram Stain   Final    RARE WBC PRESENT,BOTH PMN AND MONONUCLEAR MODERATE SQUAMOUS EPITHELIAL CELLS PRESENT NO ORGANISMS SEEN Performed at [facility]    Culture   Final    NO GROWTH 2 DAYS Performed at [facility]    Report Status 09/29/2015 FINAL  Final  Culture, blood (single)     Status: None (Preliminary result)   Collection Time: 09/29/15  8:30 AM  Result Value Ref Range Status   Specimen Description BLOOD PICC LINE  Final   Special Requests BOTTLES DRAWN AEROBIC AND ANAEROBIC 10CC  Final   Culture NO GROWTH 3 DAYS  Final   Report Status PENDING  Incomplete  Urine culture     Status: None   Collection Time: 09/29/15 10:00 AM  Result Value Ref Range Status   Specimen Description URINE, CATHETERIZED  Final   Special Requests NONE  Final   Culture NO GROWTH 1 DAY  Final   Report Status 09/30/2015 FINAL  Final  Culture, blood (single)     Status: None (Preliminary result)   Collection Time: 09/29/15 11:45 AM  Result Value Ref Range Status   Specimen Description BLOOD LEFT HAND  Final   Special Requests IN PEDIATRIC BOTTLE 3CC  Final   Culture  Setup Time   Final    YEAST IN PEDIATRIC BOTTLE CRITICAL RESULT CALLED TO, READ BACK BY AND VERIFIED WITH: N SARHAM @0729  10/01/15 MKELLY    Culture  YEAST IDENTIFICATION TO FOLLOW   Final   Report Status PENDING  Incomplete  C difficile quick scan w PCR reflex     Status: None   Collection Time: 09/29/15  1:37 PM  Result Value Ref Range Status   C Diff antigen NEGATIVE NEGATIVE  Final   C Diff toxin NEGATIVE NEGATIVE Final   C Diff interpretation Negative for toxigenic C. difficile  Final  Culture, respiratory (NON-Expectorated)     Status: None (Preliminary result)   Collection Time: 09/30/15  9:27 AM  Result Value Ref Range Status   Specimen Description INDUCED SPUTUM  Final   Special Requests Normal  Final   Gram Stain   Final    MODERATE WBC PRESENT, PREDOMINANTLY PMN MODERATE SQUAMOUS EPITHELIAL CELLS PRESENT RARE GRAM POSITIVE COCCI IN PAIRS RARE YEAST RARE GRAM NEGATIVE RODS    Culture   Final    FEW GRAM NEGATIVE RODS Performed at [facility]    Report Status PENDING  Incomplete  Culture, body fluid-bottle     Status: None (Preliminary result)   Collection Time: 09/30/15 10:21 AM  Result Value Ref Range Status   Specimen Description FLUID PLEURAL LEFT  Final   Special Requests NONE  Final   Culture NO GROWTH 2 DAYS  Final   Report Status PENDING  Incomplete  Gram stain     Status: None   Collection Time: 09/30/15 10:21 AM  Result Value Ref Range Status   Specimen Description FLUID PLEURAL LEFT  Final   Special Requests NONE  Final   Gram Stain   Final    MODERATE WBC PRESENT,BOTH PMN AND MONONUCLEAR NO ORGANISMS SEEN    Report Status 09/30/2015 FINAL  Final  Culture, blood (routine x 2)     Status: None (Preliminary result)   Collection Time: 10/01/15  9:55 AM  Result Value Ref Range Status   Specimen Description BLOOD LEFT WRIST  Final   Special Requests BOTTLES DRAWN AEROBIC AND ANAEROBIC 5CC  Final   Culture NO GROWTH 1 DAY  Final   Report Status PENDING  Incomplete  Culture, blood (routine x 2)     Status: None (Preliminary result)   Collection Time: 10/01/15 10:03 AM  Result Value Ref  Range Status   Specimen Description BLOOD LEFT HAND  Final   Special Requests IN PEDIATRIC BOTTLE 3CC  Final   Culture NO GROWTH 1 DAY  Final   Report Status PENDING  Incomplete     ASSESSMENT: Mr. Quast has defervesced.Only one blood culture so far has grown yeast, has yet to be identified/pending. The source of the fungemia remains uncertain. It is unlikely that it is coming from a respiratory source since gnr isolated from sputum culture with exudative effusion.  Recommend to continue imipenem and anidulafungin pending final cultures  PLAN: 1. Continue imipenem and anidulafungin 2. Await final culture results 3. Once repeat blood cultures from 1/5 are negative for 72hr,  Recommend to replace his central line 4. Acute on chronic ckd = appears slowly improved  Judyann Munson, MD [facility] [phone] pager   [phone] cell 10/03/2015, 9:50 AM

## 2015-10-03 NOTE — Progress Notes (Signed)
CT surgery HeartMate 2 Rounding Note POD#10 Heartmate 2 implantation   Subjective:   Class 4 CHF with cardiogenic shock, hx nonischemic    Cardiomyopathy Preop IABP Hx BPH, TURP and bladder outlet obstruction required cystoscopy to place foley\ probable UTI preop Preop severe protein loss malnutrition, prealbumin < 8 Pre and Post-op acute on chronic renal failure Preop guaiac + stool Preop chronic Eliquis for a-fib Postop acute on chronic anemia Postop fungemia Postop L pleural effusion requiring chest tube- 1.4 L  Patient had stable night , fever improved now on antifungal agent New central line placed POD#7 Pump parameters at goal --  Mil at 0.3 - co-ox 68 % A-V pacing at 90 Remains mildly hypertensive Adequate diuresis31ff diuretics, sodium remains 150, creat 2.4  Oxygenation adequate on nasal canula CXR today with atelectasis L base after chest tube pulled  Incisions clean, dry   INR now  1.7 - coumadin 4 mg ordered  continue aspirin 81 mg VAD parameters satisfactory w/o PI events overnight Currently on HS tube feeds for protein malnutrition  LVAD INTERROGATION:  HeartMate II LVAD:  Flow 5.5 liters/min, speed 9000 rpm, power 5.2, PI 6.8  Controller intact  Objective:    Vital Signs:   Temp:  [98.7 F (37.1 C)-99.7 F (37.6 C)] 99.4 F (37.4 C) (01/07 0808) Pulse Rate:  [102-112] 107 (01/07 0800) Resp:  [19-31] 24 (01/07 0800) BP: (87-117)/(72-91) 103/88 mmHg (01/07 0800) SpO2:  [94 %-100 %] 97 % (01/07 0800) Weight:  [136 lb 11 oz (62 kg)] 136 lb 11 oz (62 kg) (01/07 0600) Last BM Date: 10/02/15 Mean arterial Pressure 90-95  Intake/Output:   Intake/Output Summary (Last 24 hours) at 10/03/15 0834 Last data filed at 10/03/15 0700  Gross per 24 hour  Intake 2707.8 ml  Output   2675 ml  Net   32.8 ml     Physical Exam: General:  Well appearing. No resp difficulty HEENT: normal Neck: supple. no JVP  No carotid pulses; no bruits. No lymphadenopathy or  thryomegaly appreciated. Cor: Mechanical heart sounds with LVAD hum present. Lungs: clear Abdomen: soft, nontender, nondistended. No hepatosplenomegaly. No bruits or masses. Good bowel sounds. Extremities: no cyanosis, clubbing, rash, mild- mod extremity  edema Neuro: alert & orientedx3, cranial nerves grossly intact. t  Telemetry: paced rhythm 90  Labs: Basic Metabolic Panel:  Recent Labs Lab 09/27/15 0345 09/28/15 0425 09/29/15 0515 09/30/15 0500 10/01/15 0415 10/01/15 1741 10/02/15 0425 10/03/15 0455  NA 144 146* 145 148* 148* 149* 150* 151*  K 3.6 3.5 3.4* 3.6 3.1* 3.1* 3.6 3.4*  CL 109 113* 111 112* 116* 118* 116* 114*  CO2 23 22 25 24 24   --  26 26  GLUCOSE 129* 102* 125* 124* 201* 144* 195* 220*  BUN 31* 37* 35* 30* 34* 29* 35* 38*  CREATININE 3.20* 3.12* 2.90* 2.61* 2.38* 1.90* 2.32* 2.25*  CALCIUM 8.8* 8.9 8.9 8.9 9.0  --  9.0 9.3  MG 2.5* 2.4 2.2 2.0  --   --   --   --     Liver Function Tests:  Recent Labs Lab 09/27/15 0345 09/28/15 0425 09/29/15 0515 09/30/15 0500 10/01/15 0415  AST 36 29 26 34 91*  ALT 21 17 16* 18 26  ALKPHOS 103 102 115 126 117  BILITOT 1.1 1.0 1.2 1.4* 1.1  PROT 5.3* 5.4* 5.6* 5.8* 5.7*  ALBUMIN 2.2* 2.5* 2.5* 2.3* 2.9*   No results for input(s): LIPASE, AMYLASE in the last 168 hours. No results for  input(s): AMMONIA in the last 168 hours.  CBC:  Recent Labs Lab 09/27/15 0345 09/28/15 0425 09/29/15 0515 09/30/15 0500 10/01/15 0830 10/01/15 1740 10/01/15 1741 10/02/15 0425 10/03/15 0455  WBC 16.2* 12.5* 8.8 14.9* 14.8*  --   --  16.4* 14.7*  NEUTROABS 12.9* 9.6* 6.6 11.8*  --   --   --   --   --   HGB 11.0* 10.6* 10.1* 10.5* 7.7* 10.7* 11.2* 10.9* 10.6*  HCT 30.1* 30.6* 28.8* 29.3* 22.2* 30.5* 33.0* 31.2* 31.2*  MCV 83.1 82.3 82.5 84.4 82.5  --   --  83.2 83.6  PLT 291 311 323 420* 408*  --   --  475* 572*    INR:  Recent Labs Lab 09/29/15 0515 09/30/15 0500 10/01/15 0415 10/02/15 0425 10/03/15 0455  INR  3.46* 2.56* 2.80* 1.80* 1.69*    Other results:  EKG:   Imaging: Dg Chest Port 1 View  10/02/2015  CLINICAL DATA:  75 year old male with left ventricular assist device. Initial encounter. EXAM: PORTABLE CHEST 1 VIEW COMPARISON:  10/01/2015 and earlier. FINDINGS: Portable AP upright view at 0648 hours. Enteric feeding tube remains in place. Left chest tube remains. Right subclavian central line is stable. Left side cardiac AICD and LVAD remain in place. No pneumothorax. Stable pulmonary vascularity without overt edema. Bilateral veiling pulmonary opacity at both lung bases appears stable. Stable cardiomegaly and mediastinal contours. IMPRESSION: 1.  Stable lines and tubes. 2. Stable ventilation with bilateral pleural effusions. No pneumothorax or edema. Electronically Signed   By: Odessa Fleming M.D.   On: 10/02/2015 08:04     Medications:     Scheduled Medications: . amiodarone  200 mg Oral Daily  . anidulafungin  100 mg Intravenous Q24H  . aspirin  81 mg Oral Daily  . docusate sodium  200 mg Oral Daily  . feeding supplement (ENSURE ENLIVE)  237 mL Oral TID BM  . feeding supplement (VITAL 1.5 CAL)  1,000 mL Per Tube Q24H  . hydrALAZINE  25 mg Oral 3 times per day  . imipenem-cilastatin  250 mg Intravenous Q8H  . insulin aspart  0-5 Units Subcutaneous QHS  . insulin aspart  0-9 Units Subcutaneous TID WC  . metoCLOPramide (REGLAN) injection  5 mg Intravenous 3 times per day  . pantoprazole  40 mg Oral Daily  . sildenafil  40 mg Oral TID  . sodium chloride  10 mL Intravenous Q12H  . warfarin  4 mg Oral q1800  . Warfarin - Physician Dosing Inpatient   Does not apply q1800    Infusions: . sodium chloride Stopped (09/25/15 1100)  . sodium chloride 250 mL (10/03/15 0700)  . dextrose 5 % and 0.45 % NaCl with KCl 20 mEq/L 40 mL/hr at 10/03/15 0700  . EPINEPHrine 4 mg in dextrose 5% 250 mL infusion (16 mcg/mL) Stopped (09/27/15 1100)  . lactated ringers Stopped (09/26/15 0000)  . lactated  ringers Stopped (09/24/15 0700)  . milrinone 0.25 mcg/kg/min (10/03/15 0700)    PRN Medications: sodium chloride, Gerhardt's butt cream, hydrALAZINE, ondansetron (ZOFRAN) IV, oxyCODONE, sodium chloride   Assessment:  Stable with mild-mod RV dysfunction, cvp 8-12 Postoperative acute on chronic renal failure, creatinine 2.2 The patient remains edematous, will have to be careful of Lasix dosing.   Cont mil 0.3 until renal fx stablilized Cont pump speed to 9000 - will need echo next week Follow CXR for evidence of recurrent effusion due to low protein, malnutrition - no growth on pleural fluid cultures Remove  EPWs today with INR 1.7  Plan/Discussion:      I reviewed the LVAD parameters from today, and compared the results to the patient's prior recorded data.  No programming changes were made.  The LVAD is functioning within specified parameters.  The patient performs LVAD self-test daily.  LVAD interrogation was negative for any significant power changes, alarms or PI events/speed drops.  LVAD equipment check completed and is in good working order.  Back-up equipment present.   LVAD education done on emergency procedures and precautions and reviewed exit site care.  Length of Stay: [address]  Kathlee Nations Trigt III 10/03/2015, 8:34 AM

## 2015-10-03 NOTE — Progress Notes (Signed)
EPW removed @1145 , vss, pt on bedrest. Brad [name]

## 2015-10-04 ENCOUNTER — Inpatient Hospital Stay (HOSPITAL_COMMUNITY): Payer: Medicare Other

## 2015-10-04 LAB — CBC
HCT: 30.6 % — ABNORMAL LOW (ref 39.0–52.0)
HEMOGLOBIN: 10.7 g/dL — AB (ref 13.0–17.0)
MCH: 29.4 pg (ref 26.0–34.0)
MCHC: 35 g/dL (ref 30.0–36.0)
MCV: 84.1 fL (ref 78.0–100.0)
Platelets: 638 10*3/uL — ABNORMAL HIGH (ref 150–400)
RBC: 3.64 MIL/uL — AB (ref 4.22–5.81)
RDW: 15.9 % — ABNORMAL HIGH (ref 11.5–15.5)
WBC: 18.4 10*3/uL — AB (ref 4.0–10.5)

## 2015-10-04 LAB — PROTIME-INR
INR: 1.73 — ABNORMAL HIGH (ref 0.00–1.49)
Prothrombin Time: 20.2 seconds — ABNORMAL HIGH (ref 11.6–15.2)

## 2015-10-04 LAB — BASIC METABOLIC PANEL
Anion gap: 11 (ref 5–15)
BUN: 34 mg/dL — ABNORMAL HIGH (ref 6–20)
CO2: 23 mmol/L (ref 22–32)
Calcium: 9 mg/dL (ref 8.9–10.3)
Chloride: 110 mmol/L (ref 101–111)
Creatinine, Ser: 2.13 mg/dL — ABNORMAL HIGH (ref 0.61–1.24)
GFR calc Af Amer: 33 mL/min — ABNORMAL LOW (ref >60)
GFR calc non Af Amer: 29 mL/min — ABNORMAL LOW (ref >60)
Glucose, Bld: 293 mg/dL — ABNORMAL HIGH (ref 65–99)
Potassium: 3.6 mmol/L (ref 3.5–5.1)
Sodium: 144 mmol/L (ref 135–145)

## 2015-10-04 LAB — GLUCOSE, CAPILLARY
GLUCOSE-CAPILLARY: 184 mg/dL — AB (ref 65–99)
GLUCOSE-CAPILLARY: 145 mg/dL — AB (ref 65–99)
Glucose-Capillary: 156 mg/dL — ABNORMAL HIGH (ref 65–99)
Glucose-Capillary: 156 mg/dL — ABNORMAL HIGH (ref 65–99)

## 2015-10-04 LAB — CARBOXYHEMOGLOBIN
Carboxyhemoglobin: 2.4 % — ABNORMAL HIGH (ref 0.5–1.5)
Methemoglobin: 1.2 % (ref 0.0–1.5)
O2 Saturation: 67.8 %
Total hemoglobin: 11 g/dL — ABNORMAL LOW (ref 13.5–18.0)

## 2015-10-04 LAB — CULTURE, BLOOD (SINGLE): Culture: NO GROWTH

## 2015-10-04 LAB — LACTATE DEHYDROGENASE: LDH: 408 U/L — ABNORMAL HIGH (ref 98–192)

## 2015-10-04 MED ORDER — POTASSIUM CHLORIDE 10 MEQ/50ML IV SOLN
INTRAVENOUS | Status: AC
  Filled 2015-10-04: qty 150

## 2015-10-04 MED ORDER — TORSEMIDE 20 MG PO TABS
20.0000 mg | ORAL_TABLET | Freq: Every day | ORAL | Status: DC
  Administered 2015-10-04: 20 mg via ORAL
  Filled 2015-10-04: qty 1

## 2015-10-04 MED ORDER — POTASSIUM CHLORIDE 10 MEQ/50ML IV SOLN
10.0000 meq | INTRAVENOUS | Status: AC
  Administered 2015-10-04 (×2): 10 meq via INTRAVENOUS
  Filled 2015-10-04 (×2): qty 50

## 2015-10-04 MED ORDER — HYDRALAZINE HCL 50 MG PO TABS
50.0000 mg | ORAL_TABLET | Freq: Three times a day (TID) | ORAL | Status: DC
  Administered 2015-10-04 – 2015-10-05 (×3): 50 mg via ORAL
  Filled 2015-10-04 (×3): qty 1

## 2015-10-04 NOTE — Progress Notes (Addendum)
Patient ID: Brad [name], male   DOB: 11/14/1940, 75 y.o.   MRN: [id number]         [facility]    Date of Admission:  09/11/2015   Total days of antibiotics 18        Day 2 bactrim, day 4 anidula        (Day 5 imipenem- d/c'd on 1/7)          Principal Problem:   Fungemia Active Problems:   Acute on chronic systolic and diastolic heart failure, NYHA class 4 (HCC)   Cardiogenic shock (HCC)   Acute renal failure superimposed on stage 3 chronic kidney disease (HCC)   PAF (paroxysmal atrial fibrillation) (HCC)   PAH (pulmonary artery hypertension) (HCC)   Dyspnea   Hypotension   Palliative care encounter   CHF (congestive heart failure) (HCC)   LVAD (left ventricular assist device) present (HCC)   AKI (acute kidney injury) (HCC)   Automatic implantable cardioverter-defibrillator in situ ID: 75yo M with cardiogenic shock for advanced heart failure s/p HM 2 LVAD placement 09/26/2015 c/b POD#6 fever 09/29/15.found to have fungemia in 1/2 bcx also has GNR from sputum cx, and exudative L- effusion. currenting on bactrim and  andulafungin   . amiodarone  200 mg Oral Daily  . anidulafungin  100 mg Intravenous Q24H  . aspirin  81 mg Oral Daily  . docusate sodium  200 mg Oral Daily  . feeding supplement (ENSURE ENLIVE)  237 mL Oral TID BM  . feeding supplement (VITAL 1.5 CAL)  1,000 mL Per Tube Q24H  . hydrALAZINE  50 mg Oral 3 times per day  . insulin aspart  0-5 Units Subcutaneous QHS  . insulin aspart  0-9 Units Subcutaneous TID WC  . pantoprazole  40 mg Oral Daily  . sildenafil  40 mg Oral TID  . sodium chloride  10 mL Intravenous Q12H  . sulfamethoxazole-trimethoprim  320 mg Intravenous Q12H  . torsemide  20 mg Oral Daily  . warfarin  4 mg Oral q1800  . Warfarin - Physician Dosing Inpatient   Does not apply q1800    SUBJECTIVE: Afebrile, feeling like he is having slighty more energy   Review of Systems: Review of Systems  Constitutional:  Positive for malaise/fatigue. Negative for fever, chills and diaphoresis.  Respiratory: Negative for cough and sputum production.   Neurological: Positive for weakness.    Past Medical History  Diagnosis Date  . 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 CKD (chronic kidney disease) stage 3, GFR 30-59 ml/min   . V-tach (HCC)   . PAF (paroxysmal atrial fibrillation) (HCC)   . CVA (cerebral infarction)   . BPH (benign prostatic hyperplasia)   . HTN (hypertension), benign   . Hyperlipidemia     Social History  Substance Use Topics  . Smoking status: Never Smoker   . Smokeless tobacco: None  . Alcohol Use: No    Family History  Problem Relation Age of Onset  . Hypertension Mother   . CVA Maternal Aunt    No Known Allergies  OBJECTIVE: Filed Vitals:   10/04/15 0700 10/04/15 0800 10/04/15 0900 10/04/15 1000  BP: 107/96 105/90 117/95 108/80  Pulse: 99 99 98 103  Temp:  98.5 F (36.9 C)    TempSrc:  Oral    Resp: 28 31 27 31   Height:      Weight:  SpO2: 97% 97% 98% 97%   Body mass index is 21.09 kg/(m^2).  Physical Exam  Constitutional: He is oriented to person, place, and time.  He is sleeping in bed, but easily arousable  Eyes: Conjunctivae are normal.  Cardiovascular: Normal rate and regular rhythm.   No murmur heard. LVAD hum noted.  Pulmonary/Chest:  Clear breath sounds anteriorly. Incisions look good.  Abdominal: Soft. He exhibits no mass. There is no tenderness.  Musculoskeletal: Normal range of motion.  Neurological: He is alert and oriented to person, place, and time.  Skin: No rash noted.  Psychiatric: Mood and affect normal.    Lab Results Lab Results  Component Value Date   WBC 18.4* 10/04/2015   HGB 10.7* 10/04/2015   HCT 30.6* 10/04/2015   MCV 84.1 10/04/2015   PLT 638* 10/04/2015    Lab Results  Component Value Date   CREATININE 2.13* 10/04/2015   BUN 34* 10/04/2015   NA 144 10/04/2015   K 3.6  10/04/2015   CL 110 10/04/2015   CO2 23 10/04/2015    Lab Results  Component Value Date   ALT 26 10/01/2015   AST 91* 10/01/2015   ALKPHOS 117 10/01/2015   BILITOT 1.1 10/01/2015     Microbiology: Recent Results (from the past 240 hour(s))  Culture, respiratory (NON-Expectorated)     Status: None   Collection Time: 09/26/15  9:00 AM  Result Value Ref Range Status   Specimen Description TRACHEAL ASPIRATE  Final   Special Requests Normal  Final   Gram Stain   Final    RARE WBC PRESENT,BOTH PMN AND MONONUCLEAR MODERATE SQUAMOUS EPITHELIAL CELLS PRESENT NO ORGANISMS SEEN Performed at [facility]    Culture   Final    NO GROWTH 2 DAYS Performed at [facility]    Report Status 09/29/2015 FINAL  Final  Culture, blood (single)     Status: None (Preliminary result)   Collection Time: 09/29/15  8:30 AM  Result Value Ref Range Status   Specimen Description BLOOD PICC LINE  Final   Special Requests BOTTLES DRAWN AEROBIC AND ANAEROBIC 10CC  Final   Culture NO GROWTH 4 DAYS  Final   Report Status PENDING  Incomplete  Urine culture     Status: None   Collection Time: 09/29/15 10:00 AM  Result Value Ref Range Status   Specimen Description URINE, CATHETERIZED  Final   Special Requests NONE  Final   Culture NO GROWTH 1 DAY  Final   Report Status 09/30/2015 FINAL  Final  Culture, blood (single)     Status: None (Preliminary result)   Collection Time: 09/29/15 11:45 AM  Result Value Ref Range Status   Specimen Description BLOOD LEFT HAND  Final   Special Requests IN PEDIATRIC BOTTLE 3CC  Final   Culture  Setup Time   Final    YEAST IN PEDIATRIC BOTTLE CRITICAL RESULT CALLED TO, READ BACK BY AND VERIFIED WITH: N SARHAM @0729  10/01/15 MKELLY    Culture   Final    CANDIDA SPECIES, NOT ALBICANS TO [address] FOR SENSITIVITY    Report Status PENDING  Incomplete  C difficile quick scan w PCR reflex     Status: None   Collection Time: 09/29/15  1:37 PM    Result Value Ref Range Status   C Diff antigen NEGATIVE NEGATIVE Final   C Diff toxin NEGATIVE NEGATIVE Final   C Diff interpretation Negative for toxigenic C. difficile  Final  Culture, respiratory (NON-Expectorated)  Status: None   Collection Time: 09/30/15  9:27 AM  Result Value Ref Range Status   Specimen Description INDUCED SPUTUM  Final   Special Requests Normal  Final   Gram Stain   Final    MODERATE WBC PRESENT, PREDOMINANTLY PMN MODERATE SQUAMOUS EPITHELIAL CELLS PRESENT RARE GRAM POSITIVE COCCI IN PAIRS RARE YEAST RARE GRAM NEGATIVE RODS    Culture   Final    FEW STENOTROPHOMONAS MALTOPHILIA Performed at [facility]    Report Status 10/03/2015 FINAL  Final   Organism ID, Bacteria STENOTROPHOMONAS MALTOPHILIA  Final      Susceptibility   Stenotrophomonas maltophilia - MIC*    TRIMETH/SULFA Value in next row Sensitive      <=20 SENSITIVE(NOTE)    LEVOFLOXACIN Value in next row Sensitive      <=20 SENSITIVE(NOTE)    * FEW STENOTROPHOMONAS MALTOPHILIA  Culture, body fluid-bottle     Status: None (Preliminary result)   Collection Time: 09/30/15 10:21 AM  Result Value Ref Range Status   Specimen Description FLUID PLEURAL LEFT  Final   Special Requests NONE  Final   Culture NO GROWTH 3 DAYS  Final   Report Status PENDING  Incomplete  Gram stain     Status: None   Collection Time: 09/30/15 10:21 AM  Result Value Ref Range Status   Specimen Description FLUID PLEURAL LEFT  Final   Special Requests NONE  Final   Gram Stain   Final    MODERATE WBC PRESENT,BOTH PMN AND MONONUCLEAR NO ORGANISMS SEEN    Report Status 09/30/2015 FINAL  Final  Culture, blood (routine x 2)     Status: None (Preliminary result)   Collection Time: 10/01/15  9:55 AM  Result Value Ref Range Status   Specimen Description BLOOD LEFT WRIST  Final   Special Requests BOTTLES DRAWN AEROBIC AND ANAEROBIC 5CC  Final   Culture NO GROWTH 2 DAYS  Final   Report Status PENDING  Incomplete   Culture, blood (routine x 2)     Status: None (Preliminary result)   Collection Time: 10/01/15 10:03 AM  Result Value Ref Range Status   Specimen Description BLOOD LEFT HAND  Final   Special Requests IN PEDIATRIC BOTTLE 3CC  Final   Culture NO GROWTH 2 DAYS  Final   Report Status PENDING  Incomplete     ASSESSMENT: Mr. Huesman has defervesced. 75yo M with early LVAD infection with non-albicans candidemia. The source of the fungemia remains uncertain. Also found to have stenotrophomonas  Pneumonia/colonization of respiratory tract.  Recommend to continue bactrim for stenotrophomonas. continue anidulafungin until candidal species identified  PLAN: 1. stenotrophomonas pneumonia = Continue bactrim  2. Candidemia = continue on anidulafungin and Await ID on non-albicans candida species 3.  Recommend to replace his right subclavian for a new central line 4. Acute on chronic ckd = appears slowly improved, will dose bactrim according to renal function  Judyann Munson, MD [facility] [phone] pager   [phone] cell 10/04/2015, 10:57 AM

## 2015-10-04 NOTE — Progress Notes (Signed)
Patient ID: Brad [name], male   DOB: 26-Jul-1941, 75 y.o.   MRN: [id number]    Advanced Heart Failure Rounding Note HeartMate 2 Rounding Note  Subjective:    Admitted from [facility] with cardiogenic shock for advanced heart failure as INTERMACS-1.  On 12/22 foley placed by Urology requiring urethral dilation -> UTI . ? Septic shock. Placed on vanco/zosyn. IABP placed 12/23. S/p HM 2 LVAD placement 09/20/2015.   Developed fever 09/29/15.Pan cultures sent. Switched from zosyn to imipinem. 1/2 cultures + for yeast. Seen by ID and fluconazole switched to andulafungin on 1/5. Rare GNR and GPC in sputum. Vanc stopped 1/6.  S/p chest tube for L effusion (09/30/15) = exudative.   No further fevers. WBC back up a bit.  Feeling better today. Appetite improved. Ate most of his breakfast.. Denies dyspnea. Received 1L D5W overnight for  hypernatremia yesterday and also drank mor fee H2O. Sodium improved to 144. Creatinine relatively stable at 2.2/   Milrinone turned down to 0.2 yesterday. Coox 68% this morning. MAPs 90-105. CVP 10   Blood Cx 09/29/15- 1/2 yeast ->speciation still pending Urine Cx 09/29/15 - NG UA 09/29/15- with few bacteria  C. Diff 09/29/15 - Negative Resp Culture 09/25/16 - Negative. Resp Culture 09/30/15 -R are GNR/GPC -> STENOTROPHOMONAS MALTOPHILIA   Driveline change 09/29/15 with small amount of serous drainage, otherwise unremarkable.   LVAD INTERROGATION:  HeartMate II LVAD:  Flow 7.4 liters/min, speed 9000, power 6.3, PI  6.0 4 PI events last night   Objective:    Vital Signs:   Temp:  [98.3 F (36.8 C)-99.9 F (37.7 C)] 98.5 F (36.9 C) (01/08 0800) Pulse Rate:  [99-114] 99 (01/08 0800) Resp:  [23-33] 31 (01/08 0800) BP: (87-124)/(74-96) 117/95 mmHg (01/08 0900) SpO2:  [90 %-98 %] 97 % (01/08 0800) Weight:  [61.1 kg (134 lb 11.2 oz)] 61.1 kg (134 lb 11.2 oz) (01/08 0600) Last BM Date: 10/02/15 Mean arterial Pressure 90 -105  Intake/Output:   Intake/Output Summary (Last  24 hours) at 10/04/15 0908 Last data filed at 10/04/15 0700  Gross per 24 hour  Intake 3594.41 ml  Output   3070 ml  Net 524.41 ml     Physical Exam: General: NAD, Elderly appearing. Weak. Sitting in chair smiling HEENT: normal x for panda Neck: supple.  JVP 10 cm.  Carotids 2+ bilat; no bruits. No thyromegaly or nodule noted.  Cor: PMI nondisplaced. RRR. LVAD hum present.  Lungs: Decreased bases bilaterally.. Abdomen: soft, NT, mildly distended , no HSM. No bruits or masses. +BS  Extremities: no cyanosis, clubbing, rash. RUE PICC  2+ edema in thighs Neuro: alert & orientedx3, cranial nerves grossly intact. moves all 4 extremities w/o difficulty. Affect pleasant  Telemetry: reviewed personally, a sensed v pacing 80-90s   Labs: Basic Metabolic Panel:  Recent Labs Lab 09/28/15 0425 09/29/15 0515 09/30/15 0500 10/01/15 0415 10/01/15 1741 10/02/15 0425 10/03/15 0455 10/03/15 1529 10/04/15 0440  NA 146* 145 148* 148* 149* 150* 151* 149* 144  K 3.5 3.4* 3.6 3.1* 3.1* 3.6 3.4* 4.0 3.6  CL 113* 111 112* 116* 118* 116* 114* 113* 110  CO2 22 25 24 24   --  26 26  --  23  GLUCOSE 102* 125* 124* 201* 144* 195* 220* 169* 293*  BUN 37* 35* 30* 34* 29* 35* 38* 37* 34*  CREATININE 3.12* 2.90* 2.61* 2.38* 1.90* 2.32* 2.25* 2.00* 2.13*  CALCIUM 8.9 8.9 8.9 9.0  --  9.0 9.3  --  9.0  MG 2.4 2.2 2.0  --   --   --   --   --   --     Liver Function Tests:  Recent Labs Lab 09/28/15 0425 09/29/15 0515 09/30/15 0500 10/01/15 0415  AST 29 26 34 91*  ALT 17 16* 18 26  ALKPHOS 102 115 126 117  BILITOT 1.0 1.2 1.4* 1.1  PROT 5.4* 5.6* 5.8* 5.7*  ALBUMIN 2.5* 2.5* 2.3* 2.9*   No results for input(s): LIPASE, AMYLASE in the last 168 hours. No results for input(s): AMMONIA in the last 168 hours.  CBC:  Recent Labs Lab 09/28/15 0425 09/29/15 0515 09/30/15 0500 10/01/15 0830  10/01/15 1741 10/02/15 0425 10/03/15 0455 10/03/15 1529 10/04/15 0440  WBC 12.5* 8.8 14.9* 14.8*  --    --  16.4* 14.7*  --  18.4*  NEUTROABS 9.6* 6.6 11.8*  --   --   --   --   --   --   --   HGB 10.6* 10.1* 10.5* 7.7*  < > 11.2* 10.9* 10.6* 11.9* 10.7*  HCT 30.6* 28.8* 29.3* 22.2*  < > 33.0* 31.2* 31.2* 35.0* 30.6*  MCV 82.3 82.5 84.4 82.5  --   --  83.2 83.6  --  84.1  PLT 311 323 420* 408*  --   --  475* 572*  --  638*  < > = values in this interval not displayed.  INR:  Recent Labs Lab 09/30/15 0500 10/01/15 0415 10/02/15 0425 10/03/15 0455 10/04/15 0440  INR 2.56* 2.80* 1.80* 1.69* 1.73*    Other results:    Imaging: Dg Chest Port 1 View  10/04/2015  CLINICAL DATA:  75 year old male with left ventricular assist device EXAM: PORTABLE CHEST 1 VIEW COMPARISON:  Prior chest x-ray 10/03/2015 FINDINGS: The visualized portion of the enteric feeding tube remains unchanged. Right subclavian approach central venous catheter with the catheter tip overlying the superior cavoatrial junction. Left subclavian approach biventricular cardiac rhythm maintenance device is unchanged. Incompletely imaged left jugular assist device. Stable enlargement of the cardiopericardial silhouette. Partially loculated left pleural effusion. Persistent mild pulmonary edema. Layering right pleural effusion. No definite pneumothorax. Of note, extensive linear metallic artifact overlies the chest in the form of multiple cardiac leads. No acute osseous abnormality. IMPRESSION: 1. Stable support apparatus. 2. Persistent marked enlargement of the cardiopericardial silhouette which may reflect cardiomegaly and/or pericardial effusion. 3. Layering right and loculated left pleural effusions. 4. Persistent mild pulmonary edema. Electronically Signed   By: Malachy Moan M.D.   On: 10/04/2015 08:57   Dg Chest Port 1 View  10/03/2015  CLINICAL DATA:  Short of breath for 1 day EXAM: PORTABLE CHEST 1 VIEW COMPARISON:  Yesterday FINDINGS: Feeding tube stable. Left chest tube removed without ensuing pneumothorax. Left subclavian  AICD device and leads are stable. Right subclavian central venous catheter stable. Severe cardiomegaly unchanged. Left pleural effusion and basilar pulmonary opacity are stable. Right pleural effusion and basilar opacity are improved. LVAD device stable. Vascular congestion has improved. IMPRESSION: Left chest tube removed without pneumothorax. Improved vascular congestion. Stable left basilar consolidation and pleural effusion. Improved right basilar consolidation and pleural effusion Electronically Signed   By: Jolaine Click M.D.   On: 10/03/2015 09:00     Medications:     Scheduled Medications: . amiodarone  200 mg Oral Daily  . anidulafungin  100 mg Intravenous Q24H  . aspirin  81 mg Oral Daily  . docusate sodium  200 mg Oral Daily  . feeding supplement (  ENSURE ENLIVE)  237 mL Oral TID BM  . feeding supplement (VITAL 1.5 CAL)  1,000 mL Per Tube Q24H  . hydrALAZINE  50 mg Oral 3 times per day  . insulin aspart  0-5 Units Subcutaneous QHS  . insulin aspart  0-9 Units Subcutaneous TID WC  . pantoprazole  40 mg Oral Daily  . sildenafil  40 mg Oral TID  . sodium chloride  10 mL Intravenous Q12H  . sulfamethoxazole-trimethoprim  320 mg Intravenous Q12H  . torsemide  20 mg Oral Daily  . warfarin  4 mg Oral q1800  . Warfarin - Physician Dosing Inpatient   Does not apply q1800    Infusions: . sodium chloride Stopped (09/25/15 1100)  . sodium chloride 10 mL/hr (10/04/15 0700)  . EPINEPHrine 4 mg in dextrose 5% 250 mL infusion (16 mcg/mL) Stopped (09/27/15 1100)  . lactated ringers Stopped (09/26/15 0000)  . lactated ringers Stopped (09/24/15 0700)  . milrinone 0.2 mcg/kg/min (10/04/15 0700)    PRN Medications: sodium chloride, Gerhardt's butt cream, hydrALAZINE, ondansetron (ZOFRAN) IV, oxyCODONE, sodium chloride   Assessment/Plan   1. Acute on chronic systolic HF -> Cardiogenic shock: NICM with EF 5%, mild to moderate RV hypokinesis.  S/p St Jude CRT-D. s/p HM II LVAD placement  2015-10-08. Co-ox 68% - Stable from cardiac standpoint on milrinone 0.2. Co-ox 68%. Can decrease to 0.125 - Weight up 15 pounds from baseline and remains edematous. However has worsening hypernatremia and renal function still up. Hypernatremia improved with IV fluids yesterday. Will start po demadex to help with edema.  - Consider increasing pump speed tomorrow.  2. Atrial fibrillation: Paroxysmal.    - Maintaining sinus  - On coumadin and po amiodarone. INR 1.73 today. 3. 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   - stable. quiescent 4. AKI on CKD: Creatinine 1.9 prior to admission.  - Stable today at 2.1. Gentle with diuresis 5. Pulmonary HTN:   - Continue milrinone and sildenafil 40 tid. 6. Anticoagulation:  - INR 2.86 => 3.46 => 2.56.=> 2.8 => 1.8 => 1.73. Warfarin adjusted - On ASA 81 mg daily.   7. ID: Now with fungemia and stenotrophamonas in sputum -  Improved on imipenem and andulafungin -  ID following. WBC slightly up - Will talk to Urology in am about removing Foley (need urethral dilation prior to surgery) 8. Hypernatremia -Improved with D5W and free H2O. Continue to follow 9. HTN -Increase hydralazine 10.Conditioning - Rehab slowed down due to infection. Recommend CIR. Will place consult. Walk today    I reviewed the LVAD parameters from today, and compared the results to the patient's prior recorded data.  No programming changes were made.  The LVAD is functioning within specified parameters. LVAD interrogation was negative for any significant power changes, alarms or PI events/speed drops.  LVAD equipment check completed and is in good working order.  Back-up equipment present.    The patient is critically ill with multiple organ systems failure and requires high complexity decision making for assessment and support, frequent evaluation and titration of therapies, application of advanced monitoring technologies and extensive interpretation of multiple databases.   Critical Care Time  devoted to patient care services described in this note is 45 Minutes.   Length of Stay: 18  Arvilla Meres MD  10/04/2015, 9:08 AM  VAD Team --- VAD ISSUES ONLY--- Pager [phone] (7am - 7am)

## 2015-10-05 ENCOUNTER — Inpatient Hospital Stay (HOSPITAL_COMMUNITY): Payer: Medicare Other

## 2015-10-05 ENCOUNTER — Telehealth: Payer: Self-pay | Admitting: Licensed Clinical Social Worker

## 2015-10-05 DIAGNOSIS — Y95 Nosocomial condition: Secondary | ICD-10-CM

## 2015-10-05 DIAGNOSIS — R5381 Other malaise: Secondary | ICD-10-CM

## 2015-10-05 DIAGNOSIS — J189 Pneumonia, unspecified organism: Secondary | ICD-10-CM | POA: Insufficient documentation

## 2015-10-05 DIAGNOSIS — J151 Pneumonia due to Pseudomonas: Secondary | ICD-10-CM

## 2015-10-05 LAB — GLUCOSE, CAPILLARY
Glucose-Capillary: 184 mg/dL — ABNORMAL HIGH (ref 65–99)
Glucose-Capillary: 110 mg/dL — ABNORMAL HIGH (ref 65–99)
Glucose-Capillary: 166 mg/dL — ABNORMAL HIGH (ref 65–99)
Glucose-Capillary: 117 mg/dL — ABNORMAL HIGH (ref 65–99)

## 2015-10-05 LAB — BASIC METABOLIC PANEL
Anion gap: 10 (ref 5–15)
BUN: 38 mg/dL — ABNORMAL HIGH (ref 6–20)
CO2: 24 mmol/L (ref 22–32)
Calcium: 9.1 mg/dL (ref 8.9–10.3)
Chloride: 108 mmol/L (ref 101–111)
Creatinine, Ser: 2.37 mg/dL — ABNORMAL HIGH (ref 0.61–1.24)
GFR calc Af Amer: 29 mL/min — ABNORMAL LOW (ref >60)
GFR calc non Af Amer: 25 mL/min — ABNORMAL LOW (ref >60)
Glucose, Bld: 196 mg/dL — ABNORMAL HIGH (ref 65–99)
Potassium: 4 mmol/L (ref 3.5–5.1)
Sodium: 142 mmol/L (ref 135–145)

## 2015-10-05 LAB — CBC
HCT: 31.4 % — ABNORMAL LOW (ref 39.0–52.0)
HEMOGLOBIN: 10.8 g/dL — AB (ref 13.0–17.0)
MCH: 28.6 pg (ref 26.0–34.0)
MCHC: 34.4 g/dL (ref 30.0–36.0)
MCV: 83.3 fL (ref 78.0–100.0)
PLATELETS: 761 10*3/uL — AB (ref 150–400)
RBC: 3.77 MIL/uL — AB (ref 4.22–5.81)
RDW: 16 % — ABNORMAL HIGH (ref 11.5–15.5)
WBC: 17.5 10*3/uL — AB (ref 4.0–10.5)

## 2015-10-05 LAB — POCT I-STAT, CHEM 8
BUN: 37 mg/dL — AB (ref 6–20)
CREATININE: 2.2 mg/dL — AB (ref 0.61–1.24)
Calcium, Ion: 1.23 mmol/L (ref 1.13–1.30)
Chloride: 106 mmol/L (ref 101–111)
Glucose, Bld: 177 mg/dL — ABNORMAL HIGH (ref 65–99)
HEMATOCRIT: 35 % — AB (ref 39.0–52.0)
HEMOGLOBIN: 11.9 g/dL — AB (ref 13.0–17.0)
POTASSIUM: 4 mmol/L (ref 3.5–5.1)
SODIUM: 143 mmol/L (ref 135–145)
TCO2: 20 mmol/L (ref 0–100)

## 2015-10-05 LAB — CARBOXYHEMOGLOBIN
Carboxyhemoglobin: 2.1 % — ABNORMAL HIGH (ref 0.5–1.5)
Methemoglobin: 1.2 % (ref 0.0–1.5)
O2 Saturation: 68.6 %
Total hemoglobin: 10.3 g/dL — ABNORMAL LOW (ref 13.5–18.0)

## 2015-10-05 LAB — CULTURE, BODY FLUID-BOTTLE

## 2015-10-05 LAB — PROTIME-INR
INR: 2.1 — ABNORMAL HIGH (ref 0.00–1.49)
Prothrombin Time: 23.4 seconds — ABNORMAL HIGH (ref 11.6–15.2)

## 2015-10-05 LAB — CULTURE, BODY FLUID W GRAM STAIN -BOTTLE: Culture: NO GROWTH

## 2015-10-05 LAB — LACTATE DEHYDROGENASE: LDH: 450 U/L — ABNORMAL HIGH (ref 98–192)

## 2015-10-05 MED ORDER — SODIUM CHLORIDE 0.9 % IJ SOLN
10.0000 mL | Freq: Two times a day (BID) | INTRAMUSCULAR | Status: DC
  Administered 2015-10-05: 30 mL
  Administered 2015-10-05 – 2015-10-12 (×13): 10 mL
  Administered 2015-10-12: 30 mL
  Administered 2015-10-14 – 2015-10-15 (×3): 10 mL

## 2015-10-05 MED ORDER — SODIUM CHLORIDE 0.9 % IJ SOLN
10.0000 mL | INTRAMUSCULAR | Status: DC | PRN
  Administered 2015-10-05: 30 mL
  Administered 2015-10-09: 40 mL
  Administered 2015-10-09 (×2): 20 mL
  Administered 2015-10-10: 10 mL
  Administered 2015-10-12: 20 mL
  Filled 2015-10-05 (×6): qty 40

## 2015-10-05 MED ORDER — INSULIN GLARGINE 100 UNIT/ML ~~LOC~~ SOLN
14.0000 [IU] | Freq: Every day | SUBCUTANEOUS | Status: DC
  Administered 2015-10-05 – 2015-10-06 (×2): 14 [IU] via SUBCUTANEOUS
  Filled 2015-10-05 (×3): qty 0.14

## 2015-10-05 MED ORDER — WARFARIN SODIUM 3 MG PO TABS
3.0000 mg | ORAL_TABLET | Freq: Every day | ORAL | Status: DC
  Administered 2015-10-05 – 2015-10-06 (×2): 3 mg via ORAL
  Filled 2015-10-05 (×2): qty 1

## 2015-10-05 MED ORDER — FUROSEMIDE 10 MG/ML IJ SOLN: 40.0000 mg | Freq: Once | INTRAMUSCULAR | Status: DC

## 2015-10-05 MED ORDER — FENTANYL CITRATE (PF) 100 MCG/2ML IJ SOLN
INTRAMUSCULAR | Status: AC
  Filled 2015-10-05: qty 2

## 2015-10-05 MED ORDER — HYDRALAZINE HCL 50 MG PO TABS
75.0000 mg | ORAL_TABLET | Freq: Three times a day (TID) | ORAL | Status: DC
  Administered 2015-10-05 – 2015-10-06 (×4): 75 mg via ORAL
  Filled 2015-10-05 (×4): qty 1

## 2015-10-05 NOTE — Progress Notes (Deleted)
Nutrition Follow-up  DOCUMENTATION CODES:   Not applicable  INTERVENTION:    Continue nocturnal TF regimen -- Vital 1.5 formula at 60 ml/hr.  Run from 7 PM to 7 AM.  Total time is 12 hours.  TF regimen to provide 1080 kcals, 49 gm protein, 550 ml of free water  NUTRITION DIAGNOSIS:   Increased nutrient needs related to chronic illness as evidenced by estimated needs, ongoing  GOAL:   Patient will meet greater than or equal to 90% of their needs, progressing  MONITOR:   TF tolerance, PO intake, Labs, Weight trends, I & O's  ASSESSMENT:   75 y/o Male with h/o PAF, previous CVA, HTN, HL, V. 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 Jude CTR-D (initial ICD 2007 with upgrade to CRT in 2015) referred by Dr. Bernarda Caffey for further management of cardiogenic shock and consideration of advanced therapies.   Patient s/p procedure 12/28: INSERTION OF IMPLANTABLE LEFT VENTRICULAR ASSIST DEVICE   Pt receiving nursing care upon visit.  Did not disturb.  Pt continues on a Heart Healthy/Carbohydrate Modified diet. Per flowsheet records, PO intake still variable at 20-50%.  Nocturnal TF regimen continues: Vital 1.5 at 60 ml/hr (7 PM--7 AM) via CORTRAK feeding tube to supplement PO intake.  LVAD Coordinator note 1/9 reviewed.  Pt feels appetite is "coming back".  Diet Order:  Diet heart healthy/carb modified Room service appropriate?: Yes; Fluid consistency:: Thin  Skin:  Reviewed, no issues  Last BM:  1/8  Height:   Ht Readings from Last 1 Encounters:  September 21, 2015 5\' 7"  (1.702 m)    Weight:   Wt Readings from Last 1 Encounters:  10/05/15 130 lb 11.7 oz (59.3 kg)    Ideal Body Weight:  67 kg  BMI:  Body mass index is 20.47 kg/(m^2).  Estimated Nutritional Needs:   Kcal:  1900-2100  Protein:  100-110 gm  Fluid:  per MD  EDUCATION NEEDS:   No education needs identified at this time  Maureen Chatters, RD, LDN Pager #: [phone] After-Hours Pager #:  [phone]

## 2015-10-05 NOTE — Progress Notes (Signed)
Notified Dr. Gala Romney that patient had bibasilar crackles and a pleural rub on the left, CVP 10 and up 1.8 liters for today. Also updated that patient ambulated today and required 2 L/min nasal O2 after ambulation. VAD flow rate of 7 L, Power 6.5- 6.9 w, PI 5.5-5.7 and speeds 8990-9000.. Voided 100 ml of dark tea colored urine with clots. Post-void residual 171. PCXR order for am and order obtained to give Furosemide 40 mg IV only if CVP >/= to 15 tonight.

## 2015-10-05 NOTE — Progress Notes (Signed)
Patient ID: Brad [name], male   DOB: Dec 19, 1940, 75 y.o.   MRN: [id number]         [facility]    Date of Admission:  09/10/2015   Total days of antibiotics 19        Day 5 anidulafungin        Day 2 trimethoprim sulfamethoxazole         Principal Problem:   Fungemia Active Problems:   Acute on chronic systolic and diastolic heart failure, NYHA class 4 (HCC)   LVAD (left ventricular assist device) present (HCC)   Automatic implantable cardioverter-defibrillator in situ   Cardiogenic shock (HCC)   Acute renal failure superimposed on stage 3 chronic kidney disease (HCC)   PAF (paroxysmal atrial fibrillation) (HCC)   PAH (pulmonary artery hypertension) (HCC)   Dyspnea   Hypotension   Palliative care encounter   CHF (congestive heart failure) (HCC)   AKI (acute kidney injury) (HCC)   . amiodarone  200 mg Oral Daily  . anidulafungin  100 mg Intravenous Q24H  . aspirin  81 mg Oral Daily  . docusate sodium  200 mg Oral Daily  . feeding supplement (ENSURE ENLIVE)  237 mL Oral TID BM  . feeding supplement (VITAL 1.5 CAL)  1,000 mL Per Tube Q24H  . fentaNYL      . hydrALAZINE  75 mg Oral 3 times per day  . insulin aspart  0-5 Units Subcutaneous QHS  . insulin aspart  0-9 Units Subcutaneous TID WC  . pantoprazole  40 mg Oral Daily  . sildenafil  40 mg Oral TID  . sodium chloride  10 mL Intravenous Q12H  . sodium chloride  10-40 mL Intracatheter Q12H  . sulfamethoxazole-trimethoprim  320 mg Intravenous Q12H  . warfarin  3 mg Oral q1800  . Warfarin - Physician Dosing Inpatient   Does not apply q1800    SUBJECTIVE: He is feeling better. He still bothered by cough.  Review of Systems: Review of Systems  Constitutional: Positive for malaise/fatigue. Negative for fever, chills and diaphoresis.  Respiratory: Negative for cough and sputum production.     Past Medical History  Diagnosis Date  . 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  s/p St Jude CRT-D  . CKD (chronic kidney disease) stage 3, GFR 30-59 ml/min   . V-tach (HCC)   . PAF (paroxysmal atrial fibrillation) (HCC)   . CVA (cerebral infarction)   . BPH (benign prostatic hyperplasia)   . HTN (hypertension), benign   . Hyperlipidemia     Social History  Substance Use Topics  . Smoking status: Never Smoker   . Smokeless tobacco: None  . Alcohol Use: No    Family History  Problem Relation Age of Onset  . Hypertension Mother   . CVA Maternal Aunt    No Known Allergies  OBJECTIVE: Filed Vitals:   10/05/15 1200 10/05/15 1249 10/05/15 1300 10/05/15 1400  BP: 103/82 106/84 95/82 107/84  Pulse: 98  103 98  Temp:      TempSrc:      Resp: 26  32 31  Height:      Weight:      SpO2: 94%  94% 92%   Body mass index is 20.47 kg/(m^2).  Physical Exam  Constitutional: He is oriented to person, place, and time.  He is sitting up in bed.  Eyes: Conjunctivae are normal.  Cardiovascular: Normal rate and regular rhythm.   No murmur  heard. LVAD hum noted.  Pulmonary/Chest: Effort normal. He has rales.  Abdominal: Soft. He exhibits no mass. There is no tenderness.  Musculoskeletal: Normal range of motion.  Neurological: He is alert and oriented to person, place, and time.  Skin: No rash noted.  Psychiatric: Mood and affect normal.    Lab Results Lab Results  Component Value Date   WBC 17.5* 10/05/2015   HGB 10.8* 10/05/2015   HCT 31.4* 10/05/2015   MCV 83.3 10/05/2015   PLT 761* 10/05/2015    Lab Results  Component Value Date   CREATININE 2.37* 10/05/2015   BUN 38* 10/05/2015   NA 142 10/05/2015   K 4.0 10/05/2015   CL 108 10/05/2015   CO2 24 10/05/2015    Lab Results  Component Value Date   ALT 26 10/01/2015   AST 91* 10/01/2015   ALKPHOS 117 10/01/2015   BILITOT 1.1 10/01/2015     Microbiology: Recent Results (from the past 240 hour(s))  Culture, respiratory (NON-Expectorated)     Status: None   Collection Time: 09/26/15  9:00 AM    Result Value Ref Range Status   Specimen Description TRACHEAL ASPIRATE  Final   Special Requests Normal  Final   Gram Stain   Final    RARE WBC PRESENT,BOTH PMN AND MONONUCLEAR MODERATE SQUAMOUS EPITHELIAL CELLS PRESENT NO ORGANISMS SEEN Performed at [facility]    Culture   Final    NO GROWTH 2 DAYS Performed at [facility]    Report Status 09/29/2015 FINAL  Final  Culture, blood (single)     Status: None   Collection Time: 09/29/15  8:30 AM  Result Value Ref Range Status   Specimen Description BLOOD PICC LINE  Final   Special Requests BOTTLES DRAWN AEROBIC AND ANAEROBIC 10CC  Final   Culture NO GROWTH 5 DAYS  Final   Report Status 10/04/2015 FINAL  Final  Urine culture     Status: None   Collection Time: 09/29/15 10:00 AM  Result Value Ref Range Status   Specimen Description URINE, CATHETERIZED  Final   Special Requests NONE  Final   Culture NO GROWTH 1 DAY  Final   Report Status 09/30/2015 FINAL  Final  Culture, blood (single)     Status: None (Preliminary result)   Collection Time: 09/29/15 11:45 AM  Result Value Ref Range Status   Specimen Description BLOOD LEFT HAND  Final   Special Requests IN PEDIATRIC BOTTLE 3CC  Final   Culture  Setup Time   Final    YEAST IN PEDIATRIC BOTTLE CRITICAL RESULT CALLED TO, READ BACK BY AND VERIFIED WITH: N SARHAM @0729  10/01/15 MKELLY    Culture   Final    CANDIDA SPECIES, NOT ALBICANS TO [address] FOR SENSITIVITY    Report Status PENDING  Incomplete  C difficile quick scan w PCR reflex     Status: None   Collection Time: 09/29/15  1:37 PM  Result Value Ref Range Status   C Diff antigen NEGATIVE NEGATIVE Final   C Diff toxin NEGATIVE NEGATIVE Final   C Diff interpretation Negative for toxigenic C. difficile  Final  Culture, respiratory (NON-Expectorated)     Status: None   Collection Time: 09/30/15  9:27 AM  Result Value Ref Range Status   Specimen Description INDUCED SPUTUM  Final   Special  Requests Normal  Final   Gram Stain   Final    MODERATE WBC PRESENT, PREDOMINANTLY PMN MODERATE SQUAMOUS EPITHELIAL CELLS PRESENT  RARE GRAM POSITIVE COCCI IN PAIRS RARE YEAST RARE GRAM NEGATIVE RODS    Culture   Final    FEW STENOTROPHOMONAS MALTOPHILIA Performed at [facility]    Report Status 10/03/2015 FINAL  Final   Organism ID, Bacteria STENOTROPHOMONAS MALTOPHILIA  Final      Susceptibility   Stenotrophomonas maltophilia - MIC*    TRIMETH/SULFA Value in next row Sensitive      <=20 SENSITIVE(NOTE)    LEVOFLOXACIN Value in next row Sensitive      <=20 SENSITIVE(NOTE)    * FEW STENOTROPHOMONAS MALTOPHILIA  Culture, body fluid-bottle     Status: None (Preliminary result)   Collection Time: 09/30/15 10:21 AM  Result Value Ref Range Status   Specimen Description FLUID PLEURAL LEFT  Final   Special Requests NONE  Final   Culture NO GROWTH 4 DAYS  Final   Report Status PENDING  Incomplete  Gram stain     Status: None   Collection Time: 09/30/15 10:21 AM  Result Value Ref Range Status   Specimen Description FLUID PLEURAL LEFT  Final   Special Requests NONE  Final   Gram Stain   Final    MODERATE WBC PRESENT,BOTH PMN AND MONONUCLEAR NO ORGANISMS SEEN    Report Status 09/30/2015 FINAL  Final  Culture, blood (routine x 2)     Status: None (Preliminary result)   Collection Time: 10/01/15  9:55 AM  Result Value Ref Range Status   Specimen Description BLOOD LEFT WRIST  Final   Special Requests BOTTLES DRAWN AEROBIC AND ANAEROBIC 5CC  Final   Culture NO GROWTH 3 DAYS  Final   Report Status PENDING  Incomplete  Culture, blood (routine x 2)     Status: None (Preliminary result)   Collection Time: 10/01/15 10:03 AM  Result Value Ref Range Status   Specimen Description BLOOD LEFT HAND  Final   Special Requests IN PEDIATRIC BOTTLE 3CC  Final   Culture NO GROWTH 3 DAYS  Final   Report Status PENDING  Incomplete     ASSESSMENT: He had transient fungemia with a  non-albicans strain of Candida. Antifungal susceptibilities are pending. I will continue anidulafungin for now. He is also on trimethoprim sulfamethoxazole for stenotrophomonas healthcare associated pneumonia.  PLAN: 1. Continue anidulafungin and trimethoprim sulfamethoxazole 2. Await results of fungal susceptibility testing 3. Monitor creatinine closely  [name], MD [facility] [phone] pager   [phone] cell 10/05/2015, 2:51 PM

## 2015-10-05 NOTE — Progress Notes (Addendum)
Nutrition Follow-up  DOCUMENTATION CODES:   Not applicable  INTERVENTION:    Continue nocturnal TF regimen -- Vital 1.5 formula at 60 ml/hr.  Run from 7 PM to 7 AM.  Total time is 12 hours.   Continue Ensure Enlive po TID, each supplement provides 350 kcal and 20 grams of protein  TF regimen to provide 1080 kcals, 49 gm protein, 550 ml of free water  NUTRITION DIAGNOSIS:   Increased nutrient needs related to chronic illness as evidenced by estimated needs, ongoing  GOAL:   Patient will meet greater than or equal to 90% of their needs, progressing  MONITOR:   TF tolerance, PO intake, Labs, Weight trends, I & O's  ASSESSMENT:   75 y/o Male with h/o PAF, previous CVA, HTN, HL, V. 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 Jude CTR-D (initial ICD 2007 with upgrade to CRT in 2015) referred by Dr. Bernarda Caffey for further management of cardiogenic shock and consideration of advanced therapies.   Patient s/p procedure 12/28: INSERTION OF IMPLANTABLE LEFT VENTRICULAR ASSIST DEVICE   Pt receiving nursing care upon visit.  Did not disturb.  Pt continues on a Heart Healthy/Carbohydrate Modified diet. Per flowsheet records, PO intake still variable at 20-50%.  Nocturnal TF regimen continues: Vital 1.5 at 60 ml/hr (7 PM--7 AM) via CORTRAK feeding tube to supplement PO intake.  LVAD Coordinator note 1/9 reviewed.  Pt feels appetite is "coming back".  Diet Order:  Diet heart healthy/carb modified Room service appropriate?: Yes; Fluid consistency:: Thin  Skin:  Reviewed, no issues  Last BM:  1/8  Height:   Ht Readings from Last 1 Encounters:  09/07/2015 5\' 7"  (1.702 m)    Weight:   Wt Readings from Last 1 Encounters:  10/05/15 130 lb 11.7 oz (59.3 kg)    Ideal Body Weight:  67 kg  BMI:  Body mass index is 20.47 kg/(m^2).  Estimated Nutritional Needs:   Kcal:  1900-2100  Protein:  100-110 gm  Fluid:  per MD  EDUCATION NEEDS:   No education needs  identified at this time  Maureen Chatters, RD, LDN Pager #: [phone] After-Hours Pager #: [phone]

## 2015-10-05 NOTE — Progress Notes (Signed)
CSW attempted to visit patient at bedside although was ambulating with PT at the time of visit. Wife is not present at bedside. CSW will attempt to  Call wife at home to offer support. CSW will continue to follow and provide support throughout recovery. Lasandra Beech, LCSW [phone]

## 2015-10-05 NOTE — Consult Note (Signed)
Physical Medicine and Rehabilitation Consult   Reason for Consult: Debility Referring Physician: Dr. Garth Schlatter.   HPI: Brad [name] is a 75 y.o. 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 He was started on gentle diuresis and he required dilatation prior to foley placement by Dr. Rod Holler for monitoring of I/O.  He developed hypotension on 12/22 and was started on IV antibiotics due to concerns of urosepsis. He required IABP as well as dual pressors to maintain BP and palliative care consulted to attempt to discuss GOC.  Patient elected to undergo LVAD placement on Sep 30, 2015 by Dr. Donata Clay. He tolerated extubation on 12/30 and PT/OT evaluations ordered. Coumadin/ASA resumed and H/H being monitored. He developed fevers with T max 102.5 on  on 09/29/15 and was pan-cultured.  Left pleural effusion treated with placement of chest tube. ID consulted for input and due to continued fevers despite antibiotics. Dr. Drue Second recommended changing antibiotics to bactrim to cover Stenotrophomonas PNA and anidulafungin for fungemia.  Therapy ongoing and patient noted to be deconditioned and requires cues to maintain cardiac precautions. CIR recommended for follow up therapy.   Review of Systems  HENT: Negative for hearing loss.   Eyes: Negative for blurred vision and double vision.  Respiratory: Positive for shortness of breath. Negative for cough.   Cardiovascular: Positive for chest pain and leg swelling.  Gastrointestinal: Negative for heartburn, nausea, abdominal pain and constipation.  Genitourinary: Negative for dysuria.       H/o hesitancy.   Musculoskeletal: Negative for myalgias, back pain and neck pain.  Neurological: Positive for dizziness (initially on  standing), speech change and weakness. Negative for focal weakness and headaches.  Psychiatric/Behavioral: The patient does not have insomnia.       Past Medical History  Diagnosis Date  . 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 CKD (chronic kidney disease) stage 3, GFR 30-59 ml/min   . V-tach (HCC)   . PAF (paroxysmal atrial fibrillation) (HCC)   . CVA (cerebral infarction)   . BPH (benign prostatic hyperplasia)   . HTN (hypertension), benign   . Hyperlipidemia     Past Surgical History  Procedure Laterality Date  . Cardiac catheterization N/A 09/10/2015    Procedure: Right Heart Cath;  Surgeon: Dolores Patty, MD;  Location: [facility] INVASIVE CV LAB;  Service: Cardiovascular;  Laterality: N/A;  . Cardiac catheterization N/A 09/26/2015    Procedure: IABP Insertion;  Surgeon: Dolores Patty, MD;  Location: [facility] INVASIVE CV LAB;  Service: Cardiovascular;  Laterality: N/A;  . Insertion of implantable left ventricular assist device N/A 09-30-2015    Procedure: INSERTION OF IMPLANTABLE LEFT VENTRICULAR ASSIST DEVICE;  Surgeon: Kerin Perna, MD;  Location: [facility] OR;  Service: Open Heart Surgery;  Laterality: N/A;  CIRC ARREST  NITRIC OXIDE  . Tee without cardioversion N/A 09-30-2015    Procedure: TRANSESOPHAGEAL ECHOCARDIOGRAM (TEE);  Surgeon: Kerin Perna, MD;  Location: [facility] OR;  Service: Open Heart Surgery;  Laterality: N/A;    Family History  Problem Relation Age of Onset  . Hypertension Mother   . CVA Maternal Aunt     Social History:  Married. Wife is retired and supportive.  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 He reports that he has never smoked. He does not have any smokeless tobacco history on file. He reports that he does not drink alcohol or use illicit drugs.    Allergies: No Known Allergies     Medications Prior to Admission  Medication Sig Dispense Refill  . atorvastatin (LIPITOR)  40 MG tablet Take 40 mg by mouth daily.  2  . bumetanide (BUMEX) 1 MG tablet Take 1 mg by mouth daily.  3  . carvedilol (COREG) 12.5 MG tablet Take 12.5 mg by mouth 2 (two) times daily.  2  . digoxin (LANOXIN) 0.125 MG tablet Take 125 mcg by mouth daily.  2  . dutasteride (AVODART) 0.5 MG capsule Take 0.5 mg by mouth daily.  3  . ELIQUIS 5 MG TABS tablet Take 5 mg by mouth 2 (two) times daily.  2  . ENTRESTO 49-51 MG Take 1 tablet by mouth 2 (two) times daily.  3  . tamsulosin (FLOMAX) 0.4 MG CAPS capsule Take 0.4 mg by mouth daily.  3    Home: Home Living Family/patient expects to be discharged to:: Private residence Living Arrangements: Spouse/significant other Available Help at Discharge: Family, Available 24 hours/day Type of Home: House Home Access: Stairs to enter [employer] of Steps: 3 Entrance Stairs-Rails: None Home Layout: One level Bathroom Shower/Tub: Tub/shower unit, Health visitor: Standard Home Equipment: Environmental consultant - standard  Functional History: Prior Function Level of Independence: Independent Functional Status:  Mobility: Bed Mobility Overal bed mobility: Needs Assistance Bed Mobility: Sit to Supine Supine to sit: Mod assist, +2 for safety/equipment, +2 for physical assistance Sit to supine: Mod assist, +2 for safety/equipment Sit to sidelying: Max assist General bed mobility comments: sitting up in chair  Transfers Overall transfer level: Needs assistance Equipment used: 2 person hand held assist, Rolling walker (2 wheeled) Transfers: Stand Pivot Transfers Sit to Stand: Mod assist, +2 physical assistance Stand pivot transfers: Mod assist, +2 physical assistance General transfer comment: Pt requires cues to avoid using hands, and assist to power up into standing  Ambulation/Gait Ambulation/Gait assistance: Min assist Ambulation Distance (Feet): 120 Feet (then additional 100 feet with RW) Assistive device: Rolling walker (2  wheeled) Gait Pattern/deviations: Step-through pattern General Gait Details: overall mildly unsteady worsening with distance and fatigue.  Listing to the R more with fatigue. Gait velocity: slower Gait velocity interpretation: Below normal speed for age/gender    ADL: ADL Overall ADL's : Needs assistance/impaired Eating/Feeding: Minimal assistance, Sitting Grooming: Wash/dry hands, Wash/dry face, Brushing hair, Oral care, Moderate assistance, Sitting Upper Body Bathing: Maximal assistance, Sitting Lower Body Bathing: Total assistance, Sit to/from stand Upper Body Dressing : Total assistance, Sitting Lower Body Dressing: Total assistance, Sit to/from stand Toilet Transfer: Moderate assistance, +2 for physical assistance, Ambulation, Comfort height toilet, BSC, RW Toileting- Clothing Manipulation and Hygiene: Total assistance, Sit to/from stand Functional mobility during ADLs: Moderate assistance, +2 for physical assistance  Cognition: Cognition Overall Cognitive Status: Within Functional Limits for tasks assessed Orientation Level: Oriented X4 Cognition Arousal/Alertness: Awake/alert Behavior During Therapy: Flat affect Overall Cognitive Status: Within Functional Limits for tasks assessed Area of Impairment: Attention, Following commands, Problem solving Current Attention Level: Sustained Following Commands: Follows one step commands consistently, Follows one step commands with increased time Problem Solving: Slow processing   Blood pressure 115/89, pulse 92, temperature 98.6 F (37 C), temperature source Oral, resp. rate 35, height 5\' 7"  (1.702 m), weight 59.3 kg (  130 lb 11.7 oz), SpO2 94 %. Physical Exam  Nursing note and vitals reviewed. Constitutional: He is oriented to person, place, and time. He appears well-developed and well-nourished. Nasal cannula in place.  Thin male up in chair with panda in place.   HENT:  Head: Normocephalic and atraumatic.  Eyes: EOM are  normal. Pupils are equal, round, and reactive to light. Left conjunctiva has a hemorrhage.  Neck: Normal range of motion. Neck supple.  Cardiovascular: Normal rate.   Positive Hum  Respiratory: Effort normal. No respiratory distress. He has decreased breath sounds in the left middle field and the left lower field. He has no wheezes.  GI: Soft. Bowel sounds are normal. He exhibits no distension. There is no tenderness.  Musculoskeletal: He exhibits edema (2+ pedal edema left.).  Neurological: He is alert and oriented to person, place, and time. No cranial nerve deficit. Coordination abnormal.  Pleasant and interactive. Able to follow basic commands without difficulty. UE: 3/5 delt, bic,tric, wrist, HI, and LE: 3/5 HF, KE and ADF/APF. No sensory findings. Cognitively intct  Skin: Skin is warm and dry. No rash noted. He is not diaphoretic. No erythema.  Psychiatric: He has a normal mood and affect. His behavior is normal. Thought content normal.    Results for orders placed or performed during the hospital encounter of 09/03/2015 (from the past 24 hour(s))  Glucose, capillary     Status: Abnormal   Collection Time: 10/04/15 12:14 PM  Result Value Ref Range   Glucose-Capillary 156 (H) 65 - 99 mg/dL   Comment 1 Capillary Specimen    Comment 2 Notify RN   Glucose, capillary     Status: Abnormal   Collection Time: 10/04/15  5:40 PM  Result Value Ref Range   Glucose-Capillary 156 (H) 65 - 99 mg/dL   Comment 1 Capillary Specimen    Comment 2 Notify RN   Glucose, capillary     Status: Abnormal   Collection Time: 10/04/15  9:28 PM  Result Value Ref Range   Glucose-Capillary 145 (H) 65 - 99 mg/dL   Comment 1 Notify RN    Comment 2 Document in Chart   Carboxyhemoglobin (Co-ox)     Status: Abnormal   Collection Time: 10/05/15  5:00 AM  Result Value Ref Range   Total hemoglobin 10.3 (L) 13.5 - 18.0 g/dL   O2 Saturation 16.1 %   Carboxyhemoglobin 2.1 (H) 0.5 - 1.5 %   Methemoglobin 1.2 0.0 - 1.5  %  Lactate dehydrogenase     Status: Abnormal   Collection Time: 10/05/15  5:28 AM  Result Value Ref Range   LDH 450 (H) 98 - 192 U/L  Protime-INR     Status: Abnormal   Collection Time: 10/05/15  5:28 AM  Result Value Ref Range   Prothrombin Time 23.4 (H) 11.6 - 15.2 seconds   INR 2.10 (H) 0.00 - 1.49  CBC     Status: Abnormal   Collection Time: 10/05/15  5:28 AM  Result Value Ref Range   WBC 17.5 (H) 4.0 - 10.5 K/uL   RBC 3.77 (L) 4.22 - 5.81 MIL/uL   Hemoglobin 10.8 (L) 13.0 - 17.0 g/dL   HCT 09.6 (L) 04.5 - 40.9 %   MCV 83.3 78.0 - 100.0 fL   MCH 28.6 26.0 - 34.0 pg   MCHC 34.4 30.0 - 36.0 g/dL   RDW 81.1 (H) 91.4 - 78.2 %   Platelets 761 (H) 150 - 400 K/uL  Basic metabolic panel  Status: Abnormal   Collection Time: 10/05/15  5:28 AM  Result Value Ref Range   Sodium 142 135 - 145 mmol/L   Potassium 4.0 3.5 - 5.1 mmol/L   Chloride 108 101 - 111 mmol/L   CO2 24 22 - 32 mmol/L   Glucose, Bld 196 (H) 65 - 99 mg/dL   BUN 38 (H) 6 - 20 mg/dL   Creatinine, Ser 1.61 (H) 0.61 - 1.24 mg/dL   Calcium 9.1 8.9 - 09.6 mg/dL   GFR calc non Af Amer 25 (L) >60 mL/min   GFR calc Af Amer 29 (L) >60 mL/min   Anion gap 10 5 - 15   Dg Chest Port 1 View  10/04/2015  CLINICAL DATA:  75 year old male with left ventricular assist device EXAM: PORTABLE CHEST 1 VIEW COMPARISON:  Prior chest x-ray 10/03/2015 FINDINGS: The visualized portion of the enteric feeding tube remains unchanged. Right subclavian approach central venous catheter with the catheter tip overlying the superior cavoatrial junction. Left subclavian approach biventricular cardiac rhythm maintenance device is unchanged. Incompletely imaged left jugular assist device. Stable enlargement of the cardiopericardial silhouette. Partially loculated left pleural effusion. Persistent mild pulmonary edema. Layering right pleural effusion. No definite pneumothorax. Of note, extensive linear metallic artifact overlies the chest in the form of  multiple cardiac leads. No acute osseous abnormality. IMPRESSION: 1. Stable support apparatus. 2. Persistent marked enlargement of the cardiopericardial silhouette which may reflect cardiomegaly and/or pericardial effusion. 3. Layering right and loculated left pleural effusions. 4. Persistent mild pulmonary edema. Electronically Signed   By: Malachy Moan M.D.   On: 10/04/2015 08:57    Assessment/Plan: Diagnosis: Severe deconditioning/debility after LVAD placement/fungemia 1. Does the need for close, 24 hr/day medical supervision in concert with the patient's rehab needs make it unreasonable for this patient to be served in a less intensive setting? Yes 2. Co-Morbidities requiring supervision/potential complications: AKI, CHF/LVAD, NGT 3. Due to bladder management, bowel management, safety, skin/wound care, disease management, medication administration, pain management and patient education, does the patient require 24 hr/day rehab nursing? Yes 4. Does the patient require coordinated care of a physician, rehab nurse, PT (1-2 hrs/day, 5 days/week), OT (1-2 hrs/day, 5 days/week) and SLP (1-2 hrs/day, 5 days/week) to address physical and functional deficits in the context of the above medical diagnosis(es)? Yes Addressing deficits in the following areas: balance, endurance, locomotion, strength, transferring, bowel/bladder control, bathing, dressing, feeding, grooming, toileting, swallowing and psychosocial support 5. Can the patient actively participate in an intensive therapy program of at least 3 hrs of therapy per day at least 5 days per week? Yes 6. The potential for patient to make measurable gains while on inpatient rehab is excellent 7. Anticipated functional outcomes upon discharge from inpatient rehab are modified independent and supervision  with PT, modified independent, supervision and min assist with OT, modified independent with SLP. 8. Estimated rehab length of stay to reach the above  functional goals is: 7-10 days 9. Does the patient have adequate social supports and living environment to accommodate these discharge functional goals? Yes 10. Anticipated D/C setting: Home 11. Anticipated post D/C treatments: HH therapy 12. Overall Rehab/Functional Prognosis: excellent  RECOMMENDATIONS: This patient's condition is appropriate for continued rehabilitative care in the following setting: CIR Patient has agreed to participate in recommended program. Yes Note that insurance prior authorization may be required for reimbursement for recommended care.  Comment: Rehab Admissions Coordinator to follow up. Await medical stability for transfer.  Thanks,  Ranelle Oyster, MD, [place]  10/05/2015  

## 2015-10-05 NOTE — Progress Notes (Signed)
CT surgery HeartMate 2 Rounding Note POD#12 Heartmate 2 implantation   Subjective:   Class 4 CHF with cardiogenic shock, hx nonischemic    Cardiomyopathy Preop IABP Hx BPH, TURP and bladder outlet obstruction required cystoscopy to place foley\ probable UTI preop Preop severe protein loss malnutrition, prealbumin < 8 Pre and Post-op acute on chronic renal failure Preop guaiac + stool Preop chronic Eliquis for a-fib Postop acute on chronic anemia Postop fungemia Postop L pleural effusion requiring chest tube- 1.4 L  Patient had stable night , fever resolved on antifungal agent New central line placed today as rec by ID Pump parameters at goal --  Mil at 0.125 - co-ox 68 % A-V pacing at 90 Remains mildly hypertensive Adequate diuresis62ff diuretics, sodium better at 142 creat 2.3  Oxygenation adequate on room air CXR today with mild  atelectasis L base after chest tube pulled  Incisions clean, dry   INR now  1.7 - coumadin 4 mg ordered  continue aspirin 81 mg VAD parameters satisfactory w/o PI events overnight Currently on HS tube feeds for protein malnutrition  LVAD INTERROGATION:  HeartMate II LVAD:  Flow 5.3 liters/min, speed 9000 rpm, power 5.2, PI 6.8  Controller intact  Objective:    Vital Signs:   Temp:  [97.5 F (36.4 C)-99.7 F (37.6 C)] 98.6 F (37 C) (01/09 0830) Pulse Rate:  [92-106] 99 (01/09 0800) Resp:  [24-37] 27 (01/09 0800) BP: (94-120)/(79-90) 108/88 mmHg (01/09 0959) SpO2:  [92 %-96 %] 95 % (01/09 0800) Weight:  [130 lb 11.7 oz (59.3 kg)] 130 lb 11.7 oz (59.3 kg) (01/09 0220) Last BM Date: 10/04/15 Mean arterial Pressure 90-95  Intake/Output:   Intake/Output Summary (Last 24 hours) at 10/05/15 1056 Last data filed at 10/05/15 0900  Gross per 24 hour  Intake 2373.9 ml  Output   2825 ml  Net -451.1 ml     Physical Exam: General:  Well appearing. No resp difficulty HEENT: normal Neck: supple. no JVP  No carotid pulses; no bruits. No  lymphadenopathy or thryomegaly appreciated. Cor: Mechanical heart sounds with LVAD hum present. Lungs: clear Abdomen: soft, nontender, nondistended. No hepatosplenomegaly. No bruits or masses. Good bowel sounds. Extremities: no cyanosis, clubbing, rash, mild- mod extremity  edema Neuro: alert & orientedx3, cranial nerves grossly intact.             Generally weak with poor ambulation ability after              developing fungemia  Telemetry: paced rhythm 90  Labs: Basic Metabolic Panel:  Recent Labs Lab 09/29/15 0515 09/30/15 0500 10/01/15 0415  10/02/15 0425 10/03/15 0455 10/03/15 1529 10/04/15 0440 10/05/15 0528  NA 145 148* 148*  < > 150* 151* 149* 144 142  K 3.4* 3.6 3.1*  < > 3.6 3.4* 4.0 3.6 4.0  CL 111 112* 116*  < > 116* 114* 113* 110 108  CO2 25 24 24   --  26 26  --  23 24  GLUCOSE 125* 124* 201*  < > 195* 220* 169* 293* 196*  BUN 35* 30* 34*  < > 35* 38* 37* 34* 38*  CREATININE 2.90* 2.61* 2.38*  < > 2.32* 2.25* 2.00* 2.13* 2.37*  CALCIUM 8.9 8.9 9.0  --  9.0 9.3  --  9.0 9.1  MG 2.2 2.0  --   --   --   --   --   --   --   < > = values in this interval not  displayed.  Liver Function Tests:  Recent Labs Lab 09/29/15 0515 09/30/15 0500 10/01/15 0415  AST 26 34 91*  ALT 16* 18 26  ALKPHOS 115 126 117  BILITOT 1.2 1.4* 1.1  PROT 5.6* 5.8* 5.7*  ALBUMIN 2.5* 2.3* 2.9*   No results for input(s): LIPASE, AMYLASE in the last 168 hours. No results for input(s): AMMONIA in the last 168 hours.  CBC:  Recent Labs Lab 09/29/15 0515 09/30/15 0500 10/01/15 0830  10/02/15 0425 10/03/15 0455 10/03/15 1529 10/04/15 0440 10/05/15 0528  WBC 8.8 14.9* 14.8*  --  16.4* 14.7*  --  18.4* 17.5*  NEUTROABS 6.6 11.8*  --   --   --   --   --   --   --   HGB 10.1* 10.5* 7.7*  < > 10.9* 10.6* 11.9* 10.7* 10.8*  HCT 28.8* 29.3* 22.2*  < > 31.2* 31.2* 35.0* 30.6* 31.4*  MCV 82.5 84.4 82.5  --  83.2 83.6  --  84.1 83.3  PLT 323 420* 408*  --  475* 572*  --  638* 761*  <  > = values in this interval not displayed.  INR:  Recent Labs Lab 10/01/15 0415 10/02/15 0425 10/03/15 0455 10/04/15 0440 10/05/15 0528  INR 2.80* 1.80* 1.69* 1.73* 2.10*    Other results:  EKG:   Imaging: Dg Chest Port 1 View  10/04/2015  CLINICAL DATA:  75 year old male with left ventricular assist device EXAM: PORTABLE CHEST 1 VIEW COMPARISON:  Prior chest x-ray 10/03/2015 FINDINGS: The visualized portion of the enteric feeding tube remains unchanged. Right subclavian approach central venous catheter with the catheter tip overlying the superior cavoatrial junction. Left subclavian approach biventricular cardiac rhythm maintenance device is unchanged. Incompletely imaged left jugular assist device. Stable enlargement of the cardiopericardial silhouette. Partially loculated left pleural effusion. Persistent mild pulmonary edema. Layering right pleural effusion. No definite pneumothorax. Of note, extensive linear metallic artifact overlies the chest in the form of multiple cardiac leads. No acute osseous abnormality. IMPRESSION: 1. Stable support apparatus. 2. Persistent marked enlargement of the cardiopericardial silhouette which may reflect cardiomegaly and/or pericardial effusion. 3. Layering right and loculated left pleural effusions. 4. Persistent mild pulmonary edema. Electronically Signed   By: Malachy Moan M.D.   On: 10/04/2015 08:57     Medications:     Scheduled Medications: . amiodarone  200 mg Oral Daily  . anidulafungin  100 mg Intravenous Q24H  . aspirin  81 mg Oral Daily  . docusate sodium  200 mg Oral Daily  . feeding supplement (ENSURE ENLIVE)  237 mL Oral TID BM  . feeding supplement (VITAL 1.5 CAL)  1,000 mL Per Tube Q24H  . fentaNYL      . hydrALAZINE  75 mg Oral 3 times per day  . insulin aspart  0-5 Units Subcutaneous QHS  . insulin aspart  0-9 Units Subcutaneous TID WC  . pantoprazole  40 mg Oral Daily  . sildenafil  40 mg Oral TID  . sodium chloride   10 mL Intravenous Q12H  . sulfamethoxazole-trimethoprim  320 mg Intravenous Q12H  . warfarin  3 mg Oral q1800  . Warfarin - Physician Dosing Inpatient   Does not apply q1800    Infusions: . sodium chloride Stopped (09/25/15 1100)  . sodium chloride 250 mL (10/05/15 0400)  . EPINEPHrine 4 mg in dextrose 5% 250 mL infusion (16 mcg/mL) Stopped (09/27/15 1100)  . lactated ringers Stopped (09/26/15 0000)  . lactated ringers Stopped (09/24/15 0700)  .  milrinone 0.125 mcg/kg/min (10/05/15 1017)    PRN Medications: sodium chloride, Gerhardt's butt cream, hydrALAZINE, ondansetron (ZOFRAN) IV, oxyCODONE, sodium chloride   Assessment:  Stable with mild-mod RV dysfunction, cvp 8-12- slow mil     wean Postoperative acute on chronic renal failure, creatinine 2.3 The patient remains edematous, will have to be careful of Lasix dosing.   Cont mil 0.125 Cont pump speed to 9000 - will need echo next week Follow CXR for evidence of recurrent effusion due to low protein, malnutrition - no growth on pleural fluid cultures Cont coumadin 3 mg daily now patient on Septra  Plan/Discussion:      I reviewed the LVAD parameters from today, and compared the results to the patient's prior recorded data.  No programming changes were made.  The LVAD is functioning within specified parameters.  The patient performs LVAD self-test daily.  LVAD interrogation was negative for any significant power changes, alarms or PI events/speed drops.  LVAD equipment check completed and is in good working order.  Back-up equipment present.   LVAD education done on emergency procedures and precautions and reviewed exit site care.  Length of Stay: [address]  Kathlee Nations [place] III 10/05/2015, 10:56 AM

## 2015-10-05 NOTE — Progress Notes (Signed)
MAP 118. Hydralazine 10 mg IV given per PRN order.

## 2015-10-05 NOTE — Care Management Important Message (Signed)
Important Message  Patient Details  Name: Keiden Bernasconi MRN: [id number] Date of Birth: [birth year]   Medicare Important Message Given:  Yes    [name] 10/05/2015, 12:40 PMImportant Message  Patient Details  Name: Martize Lary MRN: [id number] Date of Birth: Jan 01, 1941   Medicare Important Message Given:  Yes    Kacie Huxtable G 10/05/2015, 12:40 PM

## 2015-10-05 NOTE — Progress Notes (Signed)
LVAD Coordinator Advanced Heart Failure Rounds:  HM II LVAD Implanted 08/30/2015 as DT by Dr. Donata Clay.   Feeling OK today. Still weak and was not able to walk yesterday. Enteral feeds at night to supplement PO intake. Feels appetite is "coming back, but not what it was." His wife is in the room today and is happy with his progress.   Vital signs: Temp: 98.6 deg HR: 100s A sensed, V paced  ICD SJM CRT-D (defib therapy off, pacing on) Doppler BP: 114 - 118 today  Auto cuff BP: 108 - 115 / 88  MAP 95 - 105 O2 Sat: 96 - 93%  Wt:142.6 lbs (preop) > 161 > 155 > 150 > 143 > 149 > 142 > 139   LVAD interrogation reveals:  Speed: 9000 Flow: 7.4 Power: 5.2 - 6.3w PI: 5.5 - 6.5  Alarms: none Events: 5 - 10 PI daily with PI 2.8 - 4.2 Fixed speed: 9000 Low speed limit: 8400  Back up controller programmed accordingly and is at the bedside. LVAD is functioning within expected parameters. No programming changes were made this morning. Some power elevations noted on interrogation a/w PI events. Highest wattage 8.7 - 9.7w on 10/01/15 noted.   Labs:  LDH trend: 372 >... > 392 > 408 > 450  INR trend: 1.21 > ...  > 1.69 > 1.73 > 2.10  Hgb: 11.6 > 10.4 > 10.1 > 10.6 > 11.0 > 10.6 > 10.1>10.5  WBC:  8.9 > ... > 16.4 > 14.7 > 18.4 > 17.5  Co-Ox: 69% today-->decreased milrinone  Creatinine: 1.6 (preop) > ... > 3.20 > ... > 1.90 > 2.32 > 2.00 > 2.13 > 2.37  Antithrombotic Management: 09/24/15--> 325 mg ASA   81 mg now 09/24/15--> Warfarin started  Infusions: Milrinone 0.125  Epi 3 (off 09/27/15) Levo (off 09/27/15)  OR Blood Products: 4 u FFP 2 u Platelets Cell saver  ICU Blood Products:  09/21/2015--> 2 FFP, 2 pRBC (Hgb 7) 09/24/15 - 2 units pRBC 10/01/15 --> 2 u PRBC  Ventilator: Extubated 09/25/15  NO weaned off. On Sildenafil 40 mg TID for PAH   Infection: 1/2 BCs + for yeast   anidulafungin (ERAXIS) 100 mg QD 10/02/15 >> current  imipenem-cilastatin (Primaxin) 250 mg Q8H  09/29/15 >> current   Plan as discussed with team: 1. MAPs still elevated--with H/O BPH adding Cardura to assist with both issues. Currently increased Hydralazine to 75 mg TID today. Milrinone to be continued at 0.125 for now.   2. FBS > 180 and often in 200s. Receiving novolog ah/hs w/o longer acting insulin. Will D/W Dr. Vladimir Creeks planning on going slow regarding removing sutures d/t prolonged healing with malnutrition.   3. Some power elevations noted on interrogation-- Highest wattage 8.7 - 9.7w on 10/01/15 noted. Non-sustained and all are a/w PI events.   3. Ensure supplementation for now--if he cannot maintain this outpatient at current dose due to cost will need to wean him off prior to D/C to adjust coumadin dose.   4. With PI's dropping <4 occasionally would recommend to doppler BP first then follow up with automatic BP cuff. When pulsatility is low the automatic BP cuff is not the most reliable method of measurement.   5. His wife performed dressing change today with me under supervision for the first time. Encouraged her to perform with assistance of nursing staff several days a week.   Rexene Alberts, BSN, RN, CCRN VAD Coordinator   Office: [phone] 24/7 VAD  Pager: [phone]

## 2015-10-05 NOTE — Telephone Encounter (Signed)
CSW contacted patient's home and spoke with son Brad [name]. Patient's wife not home at the moment but son will pass on message of CSW call. Son reports wife is hopeful to visit patient tomorrow but will wait on the weather. CSW will continue to follow for support throughout recovery. Lasandra Beech, LCSW [phone]

## 2015-10-05 NOTE — Progress Notes (Signed)
Physical Therapy Treatment Patient Details Name: Brad [name] MRN: [id number] DOB: 05-11-1941 Today's Date: 10/05/2015    History of Present Illness 75 y/o male with h/o PAF, previous CVA, HTN, HL, V. 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 Jude CTR-D (initial ICD 2007 with upgrade to CRT in 2015). Pt s/p LVAD placement 12/28.    PT Comments    Progressing slowly, but pt participates each time asked to Get OOB.  Fatigues quickly.  Still unable to manage battery switch-over physically and mentally.   Follow Up Recommendations  CIR;Supervision/Assistance - 24 hour     Equipment Recommendations  Rolling walker with 5" wheels;3in1 (PT)    Recommendations for Other Services Rehab consult     Precautions / Restrictions Precautions Precautions: Fall;Sternal Restrictions Weight Bearing Restrictions: Yes (sternal precautions) Other Position/Activity Restrictions: sternal precautions    Mobility  Bed Mobility Overal bed mobility: Needs Assistance Bed Mobility: Supine to Sit     Supine to sit: Mod assist;+2 for safety/equipment     General bed mobility comments: assist to come forward  Transfers Overall transfer level: Needs assistance Equipment used: 2 person hand held assist;Rolling walker (2 wheeled) Transfers: Sit to/from Stand Sit to Stand: Mod assist;+2 safety/equipment Stand pivot transfers: Mod assist;+2 safety/equipment       General transfer comment: cues for hand placement and assist to come forward  Ambulation/Gait Ambulation/Gait assistance: Mod assist;+2 safety/equipment Ambulation Distance (Feet): 140 Feet Assistive device: Rolling walker (2 wheeled) Gait Pattern/deviations: Step-through pattern;Scissoring Gait velocity: moderated speed; a little too fast for control Gait velocity interpretation: Below normal speed for age/gender General Gait Details: Unsteady overall with occasional scissoring and needed for stability  assist   Stairs            Wheelchair Mobility    Modified Rankin (Stroke Patients Only)       Balance Overall balance assessment: Needs assistance   Sitting balance-Leahy Scale: Fair Sitting balance - Comments: able to maintain balance through the battery switch over.     Standing balance-Leahy Scale: Poor Standing balance comment: needs assist for standing stability                    Cognition Arousal/Alertness: Awake/alert Behavior During Therapy: Flat affect Overall Cognitive Status: Within Functional Limits for tasks assessed     Current Attention Level: Sustained   Following Commands: Follows one step commands with increased time     Problem Solving: Slow processing      Exercises      General Comments General comments (skin integrity, edema, etc.): pt needed 2-3 litters to maintain 92-95% SPO2      Pertinent Vitals/Pain Pain Assessment: Faces Faces Pain Scale: No hurt    Home Living                      Prior Function            PT Goals (current goals can now be found in the care plan section) Acute Rehab PT Goals PT Goal Formulation: With patient Time For Goal Achievement: 10/21/2015 Potential to Achieve Goals: Good Progress towards PT goals: Progressing toward goals    Frequency  Min 3X/week    PT Plan Current plan remains appropriate    Co-evaluation             End of Session Equipment Utilized During Treatment: Oxygen Activity Tolerance: Patient tolerated treatment well Patient left: in chair;with call bell/phone  within reach     Time: 1525-1555 PT Time Calculation (min) (ACUTE ONLY): 30 min  Charges:  $Gait Training: 8-22 mins $Therapeutic Activity: 8-22 mins                    G Codes:      Delawrence Fridman, [name] 10/05/2015, 4:18 PM  10/05/2015  Pekin Bing, PT [phone]  (pager)

## 2015-10-05 NOTE — Progress Notes (Signed)
LVAD Coordinator Note:  Dressing changed with his caregiver/wife today for the first time at the bedside. She has been practicing sterile technique and observing other RNs changing site up until today. She performed the dressing change under guidance today and did very well. Site care was done in accordance to protocol for daily dressings. Both sutures remain--not well approximated superficially. With malnutrition and hyperglycemia D/W Dr. Donata Clay and will leave all CT and DL sutures for longer time frame.     Advised to continue practicing sterile technique and coordinate with bedside nursing so she may do dressing with guidance if needed. Verbalized she would.     D/C Teaching session for LVAD Friday 09/27/2015 @ 09:00.

## 2015-10-05 NOTE — Progress Notes (Signed)
Rehab admissions - I am following along for possible acute inpatient rehab admission once patient is medically ready.  Call me for questions.  #[phone]

## 2015-10-05 NOTE — Plan of Care (Signed)
Problem: Education: Goal: Knowledge of the prescribed therapeutic regimen will improve Outcome: Progressing Wife did dressing change with Judeth Cornfield Dixon,RN. Need to remind her not to pick up 2nd sterile glove by cuff but to put fingers under cuff when putting gloves on.

## 2015-10-05 NOTE — Progress Notes (Signed)
Patient ID: Brad [name], male   DOB: [birth year], 75 y.o.   MRN: [id number]    Advanced Heart Failure Rounding Note HeartMate 2 Rounding Note  Subjective:   Admitted from [facility] with cardiogenic shock for advanced heart failure as INTERMACS-1.  On 12/22 foley placed by Urology requiring urethral dilation -> UTI . ? Septic shock. Placed on vanco/zosyn. IABP placed 12/23. S/p HM 2 LVAD placement 09/08/2015.   Developed fever 09/29/15.Pan cultures sent. Switched from zosyn to imipinem. 1/2 cultures + for yeast. Seen by ID and fluconazole switched to andulafungin on 1/5. Rare GNR and GPC in sputum. Vanc stopped 1/6.  S/p chest tube for L effusion (09/30/15) = exudative.   Yesterday Milrinone turned down to 0.125 mcg and torsemide was started.  Coox 69% this morning. MAPs 90-118. CVP 5-6. Creatinine up to 2.3  Complaining of fatigue. Intermittent dizziness.   Blood Cx 09/29/15- 1/2 yeast ->speciation still pending Urine Cx 09/29/15 - NG UA 09/29/15- with few bacteria  C. Diff 09/29/15 - Negative Resp Culture 09/25/16 - Negative. Resp Culture 09/30/15 -R are GNR/GPC -> STENOTROPHOMONAS MALTOPHILIA   Driveline change 09/29/15 with small amount of serous drainage, otherwise unremarkable.   LVAD INTERROGATION:  HeartMate II LVAD:  Flow 6.7 liters/min, speed 9000, power 6.0, PI  6.3 4 PI events last night   Objective:    Vital Signs:   Temp:  [97.5 F (36.4 C)-99.7 F (37.6 C)] 98.6 F (37 C) (01/09 0434) Pulse Rate:  [92-106] 92 (01/09 0700) Resp:  [24-37] 35 (01/09 0700) BP: (94-120)/(79-95) 115/89 mmHg (01/09 0700) SpO2:  [92 %-98 %] 94 % (01/09 0700) Weight:  [130 lb 11.7 oz (59.3 kg)] 130 lb 11.7 oz (59.3 kg) (01/09 0220) Last BM Date: 10/04/15 Mean arterial Pressure 90 -118  Intake/Output:   Intake/Output Summary (Last 24 hours) at 10/05/15 0822 Last data filed at 10/05/15 0700  Gross per 24 hour  Intake 2605.32 ml  Output   3035 ml  Net -429.68 ml     Physical Exam: CVP 5-6    General: NAD, Elderly appearing. Weak. Sitting in chair  HEENT: normal x for panda Neck: supple.  JVP 5-6 cm.  Carotids 2+ bilat; no bruits. No thyromegaly or nodule noted.  Cor: PMI nondisplaced. RRR. LVAD hum present.  Lungs: Decreased bases bilaterally. Abdomen: soft, NT, mildly distended , no HSM. No bruits or masses. +BS  Extremities: no cyanosis, clubbing, rash. RUE PICC  Rand LLE 2+ thigh edema  Neuro: alert & orientedx3, cranial nerves grossly intact. moves all 4 extremities w/o difficulty. Affect flat  Telemetry: reviewed personally, a sensed v pacing 80-90s   Labs: Basic Metabolic Panel:  Recent Labs Lab 09/29/15 0515 09/30/15 0500 10/01/15 0415  10/02/15 0425 10/03/15 0455 10/03/15 1529 10/04/15 0440 10/05/15 0528  NA 145 148* 148*  < > 150* 151* 149* 144 142  K 3.4* 3.6 3.1*  < > 3.6 3.4* 4.0 3.6 4.0  CL 111 112* 116*  < > 116* 114* 113* 110 108  CO2 25 24 24   --  26 26  --  23 24  GLUCOSE 125* 124* 201*  < > 195* 220* 169* 293* 196*  BUN 35* 30* 34*  < > 35* 38* 37* 34* 38*  CREATININE 2.90* 2.61* 2.38*  < > 2.32* 2.25* 2.00* 2.13* 2.37*  CALCIUM 8.9 8.9 9.0  --  9.0 9.3  --  9.0 9.1  MG 2.2 2.0  --   --   --   --   --   --   --   < > =  values in this interval not displayed.  Liver Function Tests:  Recent Labs Lab 09/29/15 0515 09/30/15 0500 10/01/15 0415  AST 26 34 91*  ALT 16* 18 26  ALKPHOS 115 126 117  BILITOT 1.2 1.4* 1.1  PROT 5.6* 5.8* 5.7*  ALBUMIN 2.5* 2.3* 2.9*   No results for input(s): LIPASE, AMYLASE in the last 168 hours. No results for input(s): AMMONIA in the last 168 hours.  CBC:  Recent Labs Lab 09/29/15 0515 09/30/15 0500 10/01/15 0830  10/02/15 0425 10/03/15 0455 10/03/15 1529 10/04/15 0440 10/05/15 0528  WBC 8.8 14.9* 14.8*  --  16.4* 14.7*  --  18.4* 17.5*  NEUTROABS 6.6 11.8*  --   --   --   --   --   --   --   HGB 10.1* 10.5* 7.7*  < > 10.9* 10.6* 11.9* 10.7* 10.8*  HCT 28.8* 29.3* 22.2*  < > 31.2* 31.2* 35.0*  30.6* 31.4*  MCV 82.5 84.4 82.5  --  83.2 83.6  --  84.1 83.3  PLT 323 420* 408*  --  475* 572*  --  638* 761*  < > = values in this interval not displayed.  INR:  Recent Labs Lab 10/01/15 0415 10/02/15 0425 10/03/15 0455 10/04/15 0440 10/05/15 0528  INR 2.80* 1.80* 1.69* 1.73* 2.10*    Other results:    Imaging: Dg Chest Port 1 View  10/04/2015  CLINICAL DATA:  75 year old male with left ventricular assist device EXAM: PORTABLE CHEST 1 VIEW COMPARISON:  Prior chest x-ray 10/03/2015 FINDINGS: The visualized portion of the enteric feeding tube remains unchanged. Right subclavian approach central venous catheter with the catheter tip overlying the superior cavoatrial junction. Left subclavian approach biventricular cardiac rhythm maintenance device is unchanged. Incompletely imaged left jugular assist device. Stable enlargement of the cardiopericardial silhouette. Partially loculated left pleural effusion. Persistent mild pulmonary edema. Layering right pleural effusion. No definite pneumothorax. Of note, extensive linear metallic artifact overlies the chest in the form of multiple cardiac leads. No acute osseous abnormality. IMPRESSION: 1. Stable support apparatus. 2. Persistent marked enlargement of the cardiopericardial silhouette which may reflect cardiomegaly and/or pericardial effusion. 3. Layering right and loculated left pleural effusions. 4. Persistent mild pulmonary edema. Electronically Signed   By: Malachy Moan M.D.   On: 10/04/2015 08:57     Medications:     Scheduled Medications: . amiodarone  200 mg Oral Daily  . anidulafungin  100 mg Intravenous Q24H  . aspirin  81 mg Oral Daily  . docusate sodium  200 mg Oral Daily  . feeding supplement (ENSURE ENLIVE)  237 mL Oral TID BM  . feeding supplement (VITAL 1.5 CAL)  1,000 mL Per Tube Q24H  . hydrALAZINE  50 mg Oral 3 times per day  . insulin aspart  0-5 Units Subcutaneous QHS  . insulin aspart  0-9 Units  Subcutaneous TID WC  . pantoprazole  40 mg Oral Daily  . sildenafil  40 mg Oral TID  . sodium chloride  10 mL Intravenous Q12H  . sulfamethoxazole-trimethoprim  320 mg Intravenous Q12H  . torsemide  20 mg Oral Daily  . warfarin  4 mg Oral q1800  . Warfarin - Physician Dosing Inpatient   Does not apply q1800    Infusions: . sodium chloride Stopped (09/25/15 1100)  . sodium chloride 250 mL (10/05/15 0400)  . EPINEPHrine 4 mg in dextrose 5% 250 mL infusion (16 mcg/mL) Stopped (09/27/15 1100)  . lactated ringers Stopped (09/26/15 0000)  . lactated  ringers Stopped (09/24/15 0700)  . milrinone 0.125 mcg/kg/min (10/05/15 0400)    PRN Medications: sodium chloride, Gerhardt's butt cream, hydrALAZINE, ondansetron (ZOFRAN) IV, oxyCODONE, sodium chloride   Assessment/Plan   1. Acute on chronic systolic HF -> Cardiogenic shock: NICM with EF 5%, mild to moderate RV hypokinesis.  S/p St Jude CRT-D. s/p HM II LVAD placement 09/01/2015. Co-ox 69% - Stable from cardiac standpoint on milrinone 0.125. Co-ox 69%. Continue milrinone one more day then stop.  - Weight down 4 pounds. Stop torsemide with increased renal function.   2. Atrial fibrillation: Paroxysmal.    - Maintaining sinus  - On coumadin and po amiodarone. INR  2.1 today. 3. 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   - stable. quiescent 4. AKI on CKD: Creatinine 1.9 prior to admission.  - Up a little. 2.1>2.3  Stop torsemide.  5. Pulmonary HTN:   - Continue milrinone and sildenafil 40 tid. 6. Anticoagulation:  - INR 2.86 => 3.46 => 2.56.=> 2.8 => 1.8 => 1.73=>2.1. Warfarin adjusted - On ASA 81 mg daily.   7. ID: Now with fungemia and stenotrophamonas in sputum -  Improved on bactrim and andulafungin -  ID following. WBC slightly down a little.  - Will talk to Urology today about removing Foley (need urethral dilation prior to surgery) 8. Hypernatremia -Improved with D5W and free H2O. Todays sodium is 142. Continue to follow 9. HTN -Increase  hydralazine to 75 mg tid.  10.Deconditioning - Rehab slowed down due to infection. Recommend CIR. CIR consult placed.   I reviewed the LVAD parameters from today, and compared the results to the patient's prior recorded data.  No programming changes were made.  The LVAD is functioning within specified parameters. LVAD interrogation was negative for any significant power changes, alarms or PI events/speed drops.  LVAD equipment check completed and is in good working order.  Back-up equipment present.   The patient is critically ill with multiple organ systems failure and requires high complexity decision making for assessment and support, frequent evaluation and titration of therapies, application of advanced monitoring technologies and extensive interpretation of multiple databases.   Critical Care Time devoted to patient care services described in this note is 45 Minutes.  Length of Stay: 39  Amy Clegg NP-C   10/05/2015, 8:22 AM  VAD Team --- VAD ISSUES ONLY--- Pager [phone] (7am - 7am)  Patient seen and examined with Tonye Becket, NP. We discussed all aspects of the encounter. I agree with the assessment and plan as stated above.   He continues to improve slowly. Still edematous but CVP low and creatinine rising. Likely 3rd spacing. Will hold diuretics. Continue milrinone one more day. Continue nutritional support. Was not able to walk yesterday will try to get him up today.   Continue andulafungin and bactrim. Will discuss central line with Dr. Donata Clay.  MAPs high. Will increase hydralazine. INR ok.   He needs aggressive rehab. CIR consult placed   Discussed with Urology. Will pull catheter today.   VAD parameters ok. Occasional PI events. Will not increase speed.  Bensimhon, Daniel,MD 9:01 AM

## 2015-10-05 NOTE — Progress Notes (Signed)
Procedure  Right subclavian vein TLC central line placed under sterile prep and drape -- old line pulled and new insertion site used  CXR pending  Blood culture drawn thru new line and sent to lab  P Donata Clay MD

## 2015-10-06 ENCOUNTER — Inpatient Hospital Stay (HOSPITAL_COMMUNITY): Payer: Medicare Other

## 2015-10-06 ENCOUNTER — Ambulatory Visit (HOSPITAL_COMMUNITY): Payer: Medicare Other

## 2015-10-06 DIAGNOSIS — I425 Other restrictive cardiomyopathy: Secondary | ICD-10-CM

## 2015-10-06 DIAGNOSIS — J189 Pneumonia, unspecified organism: Secondary | ICD-10-CM

## 2015-10-06 DIAGNOSIS — N289 Disorder of kidney and ureter, unspecified: Secondary | ICD-10-CM

## 2015-10-06 LAB — GLUCOSE, CAPILLARY
GLUCOSE-CAPILLARY: 102 mg/dL — AB (ref 65–99)
Glucose-Capillary: 100 mg/dL — ABNORMAL HIGH (ref 65–99)
Glucose-Capillary: 121 mg/dL — ABNORMAL HIGH (ref 65–99)
Glucose-Capillary: 125 mg/dL — ABNORMAL HIGH (ref 65–99)

## 2015-10-06 LAB — URINE MICROSCOPIC-ADD ON: SQUAMOUS EPITHELIAL / LPF: NONE SEEN

## 2015-10-06 LAB — URINALYSIS, ROUTINE W REFLEX MICROSCOPIC
Bilirubin Urine: NEGATIVE
GLUCOSE, UA: NEGATIVE mg/dL
Ketones, ur: 15 mg/dL — AB
Nitrite: NEGATIVE
PH: 5.5 (ref 5.0–8.0)
Protein, ur: 100 mg/dL — AB
SPECIFIC GRAVITY, URINE: 1.015 (ref 1.005–1.030)

## 2015-10-06 LAB — CULTURE, BLOOD (ROUTINE X 2)
CULTURE: NO GROWTH
Culture: NO GROWTH

## 2015-10-06 LAB — BASIC METABOLIC PANEL
Anion gap: 9 (ref 5–15)
BUN: 42 mg/dL — ABNORMAL HIGH (ref 6–20)
CALCIUM: 8.6 mg/dL — AB (ref 8.9–10.3)
CO2: 23 mmol/L (ref 22–32)
CREATININE: 2.7 mg/dL — AB (ref 0.61–1.24)
Chloride: 108 mmol/L (ref 101–111)
GFR calc non Af Amer: 22 mL/min — ABNORMAL LOW (ref >60)
GFR, EST AFRICAN AMERICAN: 25 mL/min — AB (ref >60)
Glucose, Bld: 202 mg/dL — ABNORMAL HIGH (ref 65–99)
Potassium: 4 mmol/L (ref 3.5–5.1)
SODIUM: 140 mmol/L (ref 135–145)

## 2015-10-06 LAB — PROTIME-INR
INR: 2.87 — ABNORMAL HIGH (ref 0.00–1.49)
Prothrombin Time: 29.6 seconds — ABNORMAL HIGH (ref 11.6–15.2)

## 2015-10-06 LAB — CBC
HCT: 29.6 % — ABNORMAL LOW (ref 39.0–52.0)
Hemoglobin: 10 g/dL — ABNORMAL LOW (ref 13.0–17.0)
MCH: 29.6 pg (ref 26.0–34.0)
MCHC: 33.8 g/dL (ref 30.0–36.0)
MCV: 87.6 fL (ref 78.0–100.0)
Platelets: 725 10*3/uL — ABNORMAL HIGH (ref 150–400)
RBC: 3.38 MIL/uL — ABNORMAL LOW (ref 4.22–5.81)
RDW: 17.1 % — ABNORMAL HIGH (ref 11.5–15.5)
WBC: 15 10*3/uL — ABNORMAL HIGH (ref 4.0–10.5)

## 2015-10-06 LAB — LACTATE DEHYDROGENASE: LDH: 484 U/L — ABNORMAL HIGH (ref 98–192)

## 2015-10-06 LAB — CARBOXYHEMOGLOBIN
Carboxyhemoglobin: 2.1 % — ABNORMAL HIGH (ref 0.5–1.5)
Methemoglobin: 0.9 % (ref 0.0–1.5)
O2 Saturation: 63.1 %
Total hemoglobin: 9.9 g/dL — ABNORMAL LOW (ref 13.5–18.0)

## 2015-10-06 MED ORDER — BOOST / RESOURCE BREEZE PO LIQD
237.0000 mL | Freq: Three times a day (TID) | ORAL | Status: DC
  Administered 2015-10-06 – 2015-10-13 (×6): 1 via ORAL

## 2015-10-06 MED ORDER — LEVOFLOXACIN 250 MG PO TABS
750.0000 mg | ORAL_TABLET | Freq: Once | ORAL | Status: AC
  Administered 2015-10-06: 750 mg via ORAL
  Filled 2015-10-06: qty 3

## 2015-10-06 MED ORDER — LACTINEX PO CHEW
1.0000 | CHEWABLE_TABLET | Freq: Two times a day (BID) | ORAL | Status: DC
  Administered 2015-10-06 – 2015-10-09 (×8): 1 via ORAL
  Filled 2015-10-06 (×13): qty 1

## 2015-10-06 MED ORDER — LEVOFLOXACIN 500 MG PO TABS
500.0000 mg | ORAL_TABLET | ORAL | Status: DC
  Administered 2015-10-08: 500 mg via ORAL
  Filled 2015-10-06: qty 1

## 2015-10-06 MED ORDER — ALBUMIN HUMAN 25 % IV SOLN
12.5000 g | Freq: Four times a day (QID) | INTRAVENOUS | Status: AC
  Administered 2015-10-06 – 2015-10-07 (×3): 12.5 g via INTRAVENOUS
  Filled 2015-10-06 (×3): qty 50

## 2015-10-06 MED ORDER — MILRINONE IN DEXTROSE 20 MG/100ML IV SOLN
0.4000 ug/kg/min | INTRAVENOUS | Status: DC
  Administered 2015-10-06: 0.2 ug/kg/min via INTRAVENOUS
  Administered 2015-10-08 – 2015-10-14 (×8): 0.25 ug/kg/min via INTRAVENOUS
  Administered 2015-10-15 (×2): 0.4 ug/kg/min via INTRAVENOUS
  Filled 2015-10-06 (×11): qty 100

## 2015-10-06 MED ORDER — TAMSULOSIN HCL 0.4 MG PO CAPS
0.4000 mg | ORAL_CAPSULE | Freq: Every day | ORAL | Status: DC
  Administered 2015-10-06 – 2015-10-07 (×2): 0.4 mg via ORAL
  Filled 2015-10-06 (×2): qty 1

## 2015-10-06 MED ORDER — OSMOLITE 1.5 CAL PO LIQD
1000.0000 mL | ORAL | Status: DC
  Administered 2015-10-06 – 2015-10-07 (×3): 1000 mL
  Filled 2015-10-06 (×4): qty 1000

## 2015-10-06 NOTE — Progress Notes (Signed)
Occupational Therapy Treatment Patient Details Name: Mycah Formica MRN: [id number] DOB: [birth year] Today's Date: 10/06/2015    History of present illness 75 y/o male with h/o PAF, previous CVA, HTN, HL, V. 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 Jude CTR-D (initial ICD 2007 with upgrade to CRT in 2015). Pt s/p LVAD placement 12/28.   OT comments  Pt progressing slowly with endurance and mobility for ADL. Excellent participation with BUE/LE exercise sitting EOB. Sitting PI 4.5. standing PI 3.5. Educating pt on identifying lines prior to mobility and using teach back to identify importance of drive line and why neck strap should be secured prior to any mobility.  Recommend nsg having pt identify lines and manage neck strap prior to all mobility to increase carry over. Will continue to follow acutely. Continue to recommend CIR when medically stable.   Follow Up Recommendations  CIR;Supervision/Assistance - 24 hour    Equipment Recommendations  3 in 1 bedside comode;Tub/shower seat    Recommendations for Other Services      Precautions / Restrictions Precautions Precautions: Fall;Sternal Restrictions Other Position/Activity Restrictions: sternal precautions       Mobility Bed Mobility Overal bed mobility: Needs Assistance Bed Mobility: Supine to Sit     Supine to sit: HOB elevated;Min assist     General bed mobility comments:  Transfers Overall transfer level: Needs assistance Equipment used: 1 person hand held assist Transfers: Sit to/from Stand;Stand Pivot Transfers Sit to Stand: +2 physical assistance;Min assist;+2 safety/equipment Stand pivot transfers: Min assist;+2 physical assistance;+2 safety/equipment       General transfer comment: Pt with carry over of hands to lap    Balance     Sitting balance-Leahy Scale: Fair Sitting balance - Comments: L bias   Standing balance support: During functional activity;Bilateral upper extremity  supported;Single extremity supported Standing balance-Leahy Scale: Poor Standing balance comment: stood to await chair for rest approp 2 min with min assist                   ADL                                       Functional mobility during ADLs: Minimal assistance;+2 for physical assistance;+2 for safety/equipment General ADL Comments: Education on Comptroller. Pt unable to locate drive line or accurately identify drive line or battery lines. Pt unable to explina why it was important to have neck strap around neck before moving. Worked on hip flexibility in sitting with crossing feet over knees in preparation for LB ADL.   Discussed importance of increaseing BUE stength for ADL      Vision                     Perception     Praxis      Cognition   Behavior During Therapy: Flat affect Overall Cognitive Status: No family/caregiver present to determine baseline cognitive functioning       Memory: Decreased short-term memory  Following Commands: Follows one step commands with increased time     Problem Solving: Slow processing      Extremity/Trunk Assessment     LUE weaker than R. Unable to actively flex L shoulder greater than 20 degrees in sitting. Edematous LUE.          Exercises General Exercises - Upper Extremity Shoulder Flexion: 15 reps;AAROM;Left  Elbow Flexion: AROM;Right;Left;5 reps;Seated General Exercises - Lower Extremity Ankle Circles/Pumps: AROM;20 reps;Supine Hip Flexion/Marching: AROM;Strengthening;10 reps;Supine (graded resistance) Other Exercises Other Exercises: Marching from chair position Other Exercises: RUE  Other Exercises: AROM RUE to 90 shoulder flex 15 reps Other Exercises: elbow flex/ext 20 erps; grip strengthening with squeeze ball B hand Other Exercises: LUE AAROM as tolerated.   Shoulder Instructions       General Comments      Pertinent Vitals/ Pain       Pain Assessment: Faces Faces  Pain Scale: Hurts a little bit Pain Location: chest Pain Descriptors / Indicators: Grimacing Pain Intervention(s): Limited activity within patient's tolerance  Home Living                                          Prior Functioning/Environment              Frequency Min 3X/week     Progress Toward Goals  OT Goals(current goals can now be found in the care plan section)  Progress towards OT goals: Progressing toward goals  Acute Rehab OT Goals Patient Stated Goal: to get stronger OT Goal Formulation: With patient Time For Goal Achievement: 10/26/2015 Potential to Achieve Goals: Good ADL Goals Pt Will Perform Eating: Independently;sitting Pt Will Perform Grooming: standing;with min assist Pt Will Perform Upper Body Bathing: sitting;with supervision Pt Will Perform Lower Body Bathing: with min assist;sit to/from stand Pt Will Perform Upper Body Dressing: with min assist;sitting Pt Will Perform Lower Body Dressing: with min assist;with adaptive equipment;sit to/from stand Pt Will Transfer to Toilet: with min assist;ambulating;regular height toilet;bedside commode;grab bars Pt Will Perform Toileting - Clothing Manipulation and hygiene: with min assist;sit to/from stand Pt/caregiver will Perform Home Exercise Program: Increased strength;Right Upper extremity;Left upper extremity;With written HEP provided;With Supervision Additional ADL Goal #1: Pt will manage LVAD lines/equipment with min cues   Plan Discharge plan remains appropriate    Co-evaluation                 End of Session Equipment Utilized During Treatment: Oxygen   Activity Tolerance Patient tolerated treatment well   Patient Left in chair;with call bell/phone within reach   Nurse Communication Mobility status        Time: 1500-1526 OT Time Calculation (min): 26 min  Charges: OT General Charges $OT Visit: 1 Procedure OT Treatments $Self Care/Home Management : 23-37  mins  Nalin Mazzocco,HILLARY 10/06/2015, 3:45 PM   [facility], OTR/L  [phone] 10/06/2015

## 2015-10-06 NOTE — Progress Notes (Signed)
Physical Therapy Treatment Patient Details Name: Brad [name] MRN: [id number] DOB: 07/06/1941 Today's Date: 10/06/2015    History of Present Illness 75 y/o male with h/o PAF, previous CVA, HTN, HL, V. 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 CTR-D (initial ICD 2007 with upgrade to CRT in 2015). Pt s/p LVAD placement 12/28.    PT Comments    Progressing slowly with mobility and LVAD training mostly due to fatigue and deconditioning.  Follow Up Recommendations  CIR;Supervision/Assistance - 24 hour     Equipment Recommendations  Rolling walker with 5" wheels;3in1 (PT)    Recommendations for Other Services Rehab consult     Precautions / Restrictions Precautions Precautions: Fall;Sternal Restrictions Other Position/Activity Restrictions: sternal precautions    Mobility  Bed Mobility Overal bed mobility: Needs Assistance Bed Mobility: Supine to Sit     Supine to sit: Mod assist;+2 for safety/equipment     General bed mobility comments: cues for technique, work on assymetrical scooting to EOB  Transfers Overall transfer level: Needs assistance Equipment used: Rolling walker (2 wheeled) Transfers: Sit to/from Stand Sit to Stand: Mod assist;+2 safety/equipment         General transfer comment: cues for hands in lap, assist and cues to get pt forward  Ambulation/Gait Ambulation/Gait assistance: Min assist;+2 physical assistance Ambulation Distance (Feet): 140 Feet (then additional 50 feet after rest.) Assistive device: Rolling walker (2 wheeled) Gait Pattern/deviations: Step-through pattern;Scissoring Gait velocity: slower Gait velocity interpretation: Below normal speed for age/gender General Gait Details: Still fairly unsteady without stability assist.  Starts scissoring with fatigue   Stairs            Wheelchair Mobility    Modified Rankin (Stroke Patients Only)       Balance     Sitting balance-Leahy Scale:  Fair     Standing balance support: During functional activity;Bilateral upper extremity supported;Single extremity supported Standing balance-Leahy Scale: Poor Standing balance comment: stood to await chair for rest approp 1 min with min assist for stability                    Cognition Arousal/Alertness: Awake/alert Behavior During Therapy: Flat affect Overall Cognitive Status: Within Functional Limits for tasks assessed (but slowed processing)         Following Commands: Follows one step commands with increased time     Problem Solving: Slow processing      Exercises General Exercises - Lower Extremity Hip Flexion/Marching: AROM;Strengthening;10 reps;Supine (graded resistance)    General Comments General comments (skin integrity, edema, etc.): Have initiated swithch out of batteries, but pt fatigues after being assisted with 1 battery and not enough energy or strength to change the second one.      Pertinent Vitals/Pain Pain Assessment: No/denies pain    Home Living                      Prior Function            PT Goals (current goals can now be found in the care plan section) Acute Rehab PT Goals Patient Stated Goal: did not state  PT Goal Formulation: With patient Time For Goal Achievement: 10/21/2015 Potential to Achieve Goals: Good Progress towards PT goals: Progressing toward goals    Frequency  Min 3X/week    PT Plan Current plan remains appropriate    Co-evaluation             End of  Session Equipment Utilized During Treatment: Oxygen Activity Tolerance: Patient tolerated treatment well Patient left: in chair;with call bell/phone within reach     Time: 1115-1144 PT Time Calculation (min) (ACUTE ONLY): 29 min  Charges:  $Gait Training: 8-22 mins $Therapeutic Activity: 8-22 mins                    G Codes:      Elmon Shader, [name] 10/06/2015, 1:14 PM 10/06/2015  [place], PT [phone]   (pager)

## 2015-10-06 NOTE — Progress Notes (Signed)
CSW met with patient at bedside. Patient reports he is feeling "good" although very weak. Patient states his wife continues to visit daily (3 hr drive each way) and he feels comforted by her visits. Patient stated his wife will remain home tomorrow for a rest but will return visit on Thursday. Patient states he is hopeful for improved stamina and begin to feel better. CSW will continue to follow throughout recovery. Raquel Sarna, [place]

## 2015-10-06 NOTE — Progress Notes (Signed)
LVAD Coordinator Advanced Heart Failure Rounds:  HM II LVAD Implanted 2015/10/05 as DT by Dr. Donata Clay.   Up in the chair today; sleepy but easy to awaken. Rates himself on "8/10" regarding strength and progress; however has a hard time lifting arms up with swelling. Walked in the hall about 100 feet with breaks yesterday. Has not walked currently.   Vital signs: Temp: 99.6 deg HR: 90s A sensed, V paced  ICD SJM CRT-D (defib therapy off, pacing on) Doppler BP: 76  Auto cuff BP: 80/70  MAP 76 O2 Sat: 94 - 95%   1 L  Wt:142.6 lbs (preop) > 161 > 155 > 150 > 143 > 149 > 142 > 139   LVAD interrogation reveals:  Speed: 9000 Flow: 7.0 Power: 6.2w PI: 3.5 - 4.5  Alarms: none Events: 2 events yesterday and none today thusfar Fixed speed: 9000 Low speed limit: 8400  Back up controller programmed accordingly and is at the bedside. LVAD is functioning within expected parameters. No programming changes were made this morning. Some power elevations noted on interrogation a/w PI events. Highest wattage 9.3 - 10.0 w on 10/05/15 noted. Not sustained.   Labs:  LDH trend: 372 >... > 392 > 408 > 450  INR trend: 1.21 > ...  > 1.69 > 1.73 > 2.10  Hgb: 11.6 > 10.4 > 10.1 > 10.6 > 11.0 > 10.6 > 10.1>10.5  WBC:  8.9 > ... > 16.4 > 14.7 > 18.4 > 17.5  Co-Ox: 69% today-->decreased milrinone  Creatinine: 1.6 (preop) > ... > 3.20 > ... > 1.90 > 2.32 > 2.00 > 2.13 > 2.37  Antithrombotic Management: 09/24/15--> 325 mg ASA   81 mg now 09/24/15--> Warfarin started  Infusions: None  Milrinone off 10/06/15 Epi off 09/27/15 Levo off 09/27/15  OR Blood Products: 4 u FFP 2 u Platelets Cell saver  ICU Blood Products:  October 05, 2015--> 2 FFP, 2 pRBC (Hgb 7) 09/24/15 - 2 units pRBC 10/01/15 --> 2 u PRBC  Ventilator: Extubated 09/25/15  NO weaned off. On Sildenafil 40 mg TID for PAH   Infection: 1/2 BCs + for candida 09/29/2015  anidulafungin (ERAXIS) 100 mg QD 10/01/15 >>  current  imipenem-cilastatin (Primaxin) 250 mg Q8H 09/29/15 >> current  Sputum >> stenotrophamonas on 09/30/2015  Levofloxacin 10/06/15 >> current  Septra 10/04/15 >> 10/06/15   Plan as discussed with team: 1. MAPs improved today however on the softer side after PO Hyralazine (dropped to 67 earlier this AM).   2. Some power elevations noted on interrogation as above. Continue to monitor. No turbulence to pump with auscultation. + hematuria however d/t BPH and recent foley removal. LDHs have been trending up.   3. Ensure supplementation for now--if he cannot maintain this outpatient at current dose due to cost will need to wean him off prior to D/C to adjust coumadin dose.   4. With PI's dropping <4 occasionally would recommend to doppler BP first then follow up with automatic BP cuff. When pulsatility is low the automatic BP cuff is not the most reliable method of measurement.   5. D/C teaching currently scheduled for Friday at 0900. Home equipment will be ordered today.   Rexene Alberts, BSN, RN, CCRN VAD Coordinator   Office: [phone] 24/7 VAD Pager: [phone]

## 2015-10-06 NOTE — Progress Notes (Signed)
CT surgery HeartMate 2 Rounding Note POD #13  Heartmate 2 implantation   Subjective:   Class 4 CHF with cardiogenic shock, hx nonischemic    Cardiomyopathy Preop IABP Hx BPH, TURP and bladder outlet obstruction required cystoscopy to place foley\ probable UTI preop Preop severe protein loss malnutrition, prealbumin < 8 Pre and Post-op acute on chronic renal failure Preop guaiac + stool Preop chronic Eliquis for a-fib Postop acute on chronic anemia Postop fungemia Postop L pleural effusion requiring chest tube- 1.4 L  Patient had stable night , fever resolved on antifungal agent New central line placed as rec by ID Pump parameters at goal --  Mil at 0.125 - co-ox 68 % A-V pacing at 90 Chest x-ray shows recurring left pleural effusion--patient previously had chest tube so will plan on placing Pleurx catheter later this week to manage recurrent left pleural effusion  Creatinine continues to rise now 2.7, urine output decreased Echocardiogram today shows good unloading of the LV but with persistent moderate RV dysfunction so we'll continue milrinone 0.2 to optimize renal blood flow and renal vascular gradient    Incisions clean, dry   INR now  2.7 - coumadin 3mg  ordered  continue aspirin 81 mg VAD parameters satisfactory w/o PI events  Currently on HS tube feeds for protein malnutrition  LVAD INTERROGATION:  HeartMate II LVAD:  Flow 5.3 liters/min, speed 9000 rpm, power 5.2, PI 4.8  Controller intact  Objective:    Vital Signs:   Temp:  [97.9 F (36.6 C)-99.9 F (37.7 C)] 97.9 F (36.6 C) (01/10 1540) Pulse Rate:  [89-104] 94 (01/10 1900) Resp:  [21-38] 30 (01/10 1900) BP: (74-100)/(57-84) 94/80 mmHg (01/10 1900) SpO2:  [91 %-97 %] 96 % (01/10 1900) Weight:  [130 lb 11.7 oz (59.3 kg)] 130 lb 11.7 oz (59.3 kg) (01/10 0650) Last BM Date: 10/06/15 Mean arterial Pressure 90-95  Intake/Output:   Intake/Output Summary (Last 24 hours) at 10/06/15 1922 Last data filed at  10/06/15 1900  Gross per 24 hour  Intake   1960 ml  Output    905 ml  Net   1055 ml     Physical Exam: General:  Well appearing. No resp difficulty HEENT: normal Neck: supple. no JVP  No carotid pulses; no bruits. No lymphadenopathy or thryomegaly appreciated. Cor: Mechanical heart sounds with LVAD hum present. Lungs: clear Abdomen: soft, nontender, nondistended. No hepatosplenomegaly. No bruits or masses. Good bowel sounds. Extremities: no cyanosis, clubbing, rash, mild- mod extremity  edema Neuro: alert & orientedx3, cranial nerves grossly intact.             Generally weak with poor ambulation ability after              developing fungemia  Telemetry: paced rhythm 90  Labs: Basic Metabolic Panel:  Recent Labs Lab 09/30/15 0500  10/02/15 0425 10/03/15 0455 10/03/15 1529 10/04/15 0440 10/05/15 0528 10/05/15 1704 10/06/15 0510  NA 148*  < > 150* 151* 149* 144 142 143 140  K 3.6  < > 3.6 3.4* 4.0 3.6 4.0 4.0 4.0  CL 112*  < > 116* 114* 113* 110 108 106 108  CO2 24  < > 26 26  --  23 24  --  23  GLUCOSE 124*  < > 195* 220* 169* 293* 196* 177* 202*  BUN 30*  < > 35* 38* 37* 34* 38* 37* 42*  CREATININE 2.61*  < > 2.32* 2.25* 2.00* 2.13* 2.37* 2.20* 2.70*  CALCIUM 8.9  < >  9.0 9.3  --  9.0 9.1  --  8.6*  MG 2.0  --   --   --   --   --   --   --   --   < > = values in this interval not displayed.  Liver Function Tests:  Recent Labs Lab 09/30/15 0500 10/01/15 0415  AST 34 91*  ALT 18 26  ALKPHOS 126 117  BILITOT 1.4* 1.1  PROT 5.8* 5.7*  ALBUMIN 2.3* 2.9*   No results for input(s): LIPASE, AMYLASE in the last 168 hours. No results for input(s): AMMONIA in the last 168 hours.  CBC:  Recent Labs Lab 09/30/15 0500  10/02/15 0425 10/03/15 0455 10/03/15 1529 10/04/15 0440 10/05/15 0528 10/05/15 1704 10/06/15 0407  WBC 14.9*  < > 16.4* 14.7*  --  18.4* 17.5*  --  15.0*  NEUTROABS 11.8*  --   --   --   --   --   --   --   --   HGB 10.5*  < > 10.9* 10.6*  11.9* 10.7* 10.8* 11.9* 10.0*  HCT 29.3*  < > 31.2* 31.2* 35.0* 30.6* 31.4* 35.0* 29.6*  MCV 84.4  < > 83.2 83.6  --  84.1 83.3  --  87.6  PLT 420*  < > 475* 572*  --  638* 761*  --  725*  < > = values in this interval not displayed.  INR:  Recent Labs Lab 10/02/15 0425 10/03/15 0455 10/04/15 0440 10/05/15 0528 10/06/15 0407  INR 1.80* 1.69* 1.73* 2.10* 2.87*    Other results:  EKG:   Imaging: Dg Chest Port 1 View  10/06/2015  CLINICAL DATA:  Left ventricular assist device. EXAM: PORTABLE CHEST 1 VIEW COMPARISON:  10/05/2015. FINDINGS: Right subclavian central line and feeding tube in stable position. AICD in stable position. Left ventricular assist device noted. Severe cardiomegaly. Progressive bilateral pulmonary infiltrates suggesting pulmonary edema. Low lung volumes with bibasilar atelectasis. Bilateral pleural effusions, most prominent on the left. No pneumothorax. IMPRESSION: 1. Lines and tubes in stable position. 2. AICD in stable position. Left ventricular assist device again noted. Severe cardiomegaly with progressive bilateral pulmonary alveolar infiltrates suggesting pulmonary edema. Bilateral pleural effusions, most prominent on the left. 3. Low lung volumes with bibasilar atelectasis. Electronically Signed   By: Maisie Fus  Register   On: 10/06/2015 07:55   Dg Chest Port 1 View  10/05/2015  CLINICAL DATA:  Right central line placement. EXAM: PORTABLE CHEST 1 VIEW COMPARISON:  10/04/2015 FINDINGS: 1045 hours. Right PICC line remains in place with tip positioned at the distal SVC level. A feeding tube passes into the stomach although the distal tip position is not included on the film. Cardiopericardial silhouette is enlarged but stable bibasilar collapse/consolidation with left pleural effusion not substantially changed. There is pulmonary vascular congestion without overt pulmonary edema. The ventricular assist device incompletely visualized. Dual lead pacer/AICD remains visible.  Telemetry leads overlie the chest. IMPRESSION: Stable exam. Electronically Signed   By: Kennith Center M.D.   On: 10/05/2015 10:56   Dg Abd Portable 1v  10/06/2015  CLINICAL DATA:  Shortness of breath.  Abdominal distention EXAM: PORTABLE ABDOMEN - 1 VIEW COMPARISON:  09/30/2015 FINDINGS: Visible portions of left ventricular assist device show no appreciable change. There is a feeding tube with unchanged morphology, either coiled in the antrum or directed into the gastric bulb. Diffuse gas in the small and large bowel without evidence of obstruction. IMPRESSION: 1. Diffuse bowel gas without indication of  obstruction. 2. Feeding tube tip in the antrum or duodenal bulb. Electronically Signed   By: Marnee Spring M.D.   On: 10/06/2015 12:22     Medications:     Scheduled Medications: . amiodarone  200 mg Oral Daily  . anidulafungin  100 mg Intravenous Q24H  . aspirin  81 mg Oral Daily  . docusate sodium  200 mg Oral Daily  . feeding supplement  237 mL Oral TID WC  . feeding supplement (OSMOLITE 1.5 CAL)  1,000 mL Per Tube Q24H  . furosemide  40 mg Intravenous Once  . hydrALAZINE  75 mg Oral 3 times per day  . insulin aspart  0-5 Units Subcutaneous QHS  . insulin aspart  0-9 Units Subcutaneous TID WC  . insulin glargine  14 Units Subcutaneous QHS  . lactobacillus acidophilus & bulgar  1 tablet Oral BID  . [START ON 10/08/2015] levofloxacin  500 mg Oral Q48H  . pantoprazole  40 mg Oral Daily  . sildenafil  40 mg Oral TID  . sodium chloride  10 mL Intravenous Q12H  . sodium chloride  10-40 mL Intracatheter Q12H  . tamsulosin  0.4 mg Oral QPC supper  . warfarin  3 mg Oral q1800  . Warfarin - Physician Dosing Inpatient   Does not apply q1800    Infusions: . sodium chloride Stopped (09/25/15 1100)  . sodium chloride 250 mL (10/06/15 0400)  . lactated ringers Stopped (09/26/15 0000)  . lactated ringers Stopped (09/24/15 0700)  . milrinone 0.2 mcg/kg/min (10/06/15 1900)    PRN  Medications: sodium chloride, Gerhardt's butt cream, hydrALAZINE, ondansetron (ZOFRAN) IV, oxyCODONE, sodium chloride, sodium chloride   Assessment:  Stable with mild-mod RV dysfunction, cvp 8-12- s0 continue low-dose milrinone Postoperative acute on chronic renal failure, creatinine 2.7 The patient remains edematous, will have to be careful of Lasix dosing.   Cont mil 0.2 until renal function stabilizes Cont pump speed to 9000 -  Follow CXR for evidence of recurrent effusion due to low protein, malnutrition - no growth on pleural fluid cultures. Patient will need left Pleurx catheter Cont coumadin 2.5 mg daily now patient on Septra  Plan/Discussion:      I reviewed the LVAD parameters from today, and compared the results to the patient's prior recorded data.  No programming changes were made.  The LVAD is functioning within specified parameters.  The patient performs LVAD self-test daily.  LVAD interrogation was negative for any significant power changes, alarms or PI events/speed drops.  LVAD equipment check completed and is in good working order.  Back-up equipment present.   LVAD education done on emergency procedures and precautions and reviewed exit site care.  Length of Stay: 20  Kathlee Nations Trigt III 10/06/2015, 7:22 PM

## 2015-10-06 NOTE — Progress Notes (Signed)
Patient ID: Brad [name], male   DOB: [birth year], 75 y.o.   MRN: [id number]    Advanced Heart Failure Rounding Note HeartMate 2 Rounding Note  Subjective:   Admitted from [facility] with cardiogenic shock for advanced heart failure as INTERMACS-1.  On 12/22 foley placed by Urology requiring urethral dilation -> UTI . ? Septic shock. Placed on vanco/zosyn. IABP placed 12/23. S/p HM 2 LVAD placement 09/19/2015.   Developed fever 09/29/15.Pan cultures sent. Switched from zosyn to imipinem. 1/2 cultures + for yeast. Seen by ID and fluconazole switched to andulafungin on 1/5. Rare GNR and GPC in sputum. Vanc stopped 1/6.  S/p chest tube for L effusion (09/30/15) = exudative.   He continues on milrinone 0.125 mcg and torsemide held. CO-OX 69% this morning. MAPs 70-91. Creatinine up to 2.7. Frequent urination  Overall feeling much better. Denies SOB   Blood Cx 09/29/15- 1/2 yeast ->speciation still pending -Lab sent to [facility] Urine Cx 09/29/15 - NG UA 09/29/15- with few bacteria  C. Diff 09/29/15 - Negative Resp Culture 09/25/16 - Negative. Resp Culture 09/30/15 -R are GNR/GPC -> STENOTROPHOMONAS MALTOPHILIA   Driveline change 09/29/15 with small amount of serous drainage, otherwise unremarkable.   LVAD INTERROGATION:  HeartMate II LVAD:  Flow 7.0 liters/min, speed 9000, power 6.1, PI  4.2 Rare PI events last night   Objective:    Vital Signs:   Temp:  [98.4 F (36.9 C)-99.9 F (37.7 C)] 99.6 F (37.6 C) (01/10 0754) Pulse Rate:  [89-103] 90 (01/10 0900) Resp:  [25-38] 26 (01/10 0900) BP: (74-107)/(57-92) 90/78 mmHg (01/10 0900) SpO2:  [91 %-96 %] 95 % (01/10 0900) Weight:  [130 lb 11.7 oz (59.3 kg)] 130 lb 11.7 oz (59.3 kg) (01/10 0650) Last BM Date: 10/06/15 Mean arterial Pressure 70-90s.   Intake/Output:   Intake/Output Summary (Last 24 hours) at 10/06/15 1015 Last data filed at 10/06/15 0800  Gross per 24 hour  Intake 2821.73 ml  Output   1170 ml  Net 1651.73 ml     Physical  Exam: CVP ~7 General: NAD, Elderly appearing. Weak. Sitting in chair  HEENT: normal x for panda Neck: supple.  JVP 5-6 cm.  Carotids 2+ bilat; no bruits. No thyromegaly or nodule noted.  Cor: PMI nondisplaced. RRR. LVAD hum present.  Lungs: Decreased bases bilaterally. Abdomen: soft, NT, mildly distended , no HSM. No bruits or masses. +BS  Extremities: no cyanosis, clubbing, rash. RUE PICC  Rand LLE 2+ edema  Neuro: alert & orientedx3, cranial nerves grossly intact. moves all 4 extremities w/o difficulty. Affect flat  Telemetry: reviewed personally, a sensed v pacing 90s   Labs: Basic Metabolic Panel:  Recent Labs Lab 09/30/15 0500  10/02/15 0425 10/03/15 0455 10/03/15 1529 10/04/15 0440 10/05/15 0528 10/05/15 1704 10/06/15 0510  NA 148*  < > 150* 151* 149* 144 142 143 140  K 3.6  < > 3.6 3.4* 4.0 3.6 4.0 4.0 4.0  CL 112*  < > 116* 114* 113* 110 108 106 108  CO2 24  < > 26 26  --  23 24  --  23  GLUCOSE 124*  < > 195* 220* 169* 293* 196* 177* 202*  BUN 30*  < > 35* 38* 37* 34* 38* 37* 42*  CREATININE 2.61*  < > 2.32* 2.25* 2.00* 2.13* 2.37* 2.20* 2.70*  CALCIUM 8.9  < > 9.0 9.3  --  9.0 9.1  --  8.6*  MG 2.0  --   --   --   --   --   --   --   --   < > =  values in this interval not displayed.  Liver Function Tests:  Recent Labs Lab 09/30/15 0500 10/01/15 0415  AST 34 91*  ALT 18 26  ALKPHOS 126 117  BILITOT 1.4* 1.1  PROT 5.8* 5.7*  ALBUMIN 2.3* 2.9*   No results for input(s): LIPASE, AMYLASE in the last 168 hours. No results for input(s): AMMONIA in the last 168 hours.  CBC:  Recent Labs Lab 09/30/15 0500  10/02/15 0425 10/03/15 0455 10/03/15 1529 10/04/15 0440 10/05/15 0528 10/05/15 1704 10/06/15 0407  WBC 14.9*  < > 16.4* 14.7*  --  18.4* 17.5*  --  15.0*  NEUTROABS 11.8*  --   --   --   --   --   --   --   --   HGB 10.5*  < > 10.9* 10.6* 11.9* 10.7* 10.8* 11.9* 10.0*  HCT 29.3*  < > 31.2* 31.2* 35.0* 30.6* 31.4* 35.0* 29.6*  MCV 84.4  < > 83.2  83.6  --  84.1 83.3  --  87.6  PLT 420*  < > 475* 572*  --  638* 761*  --  725*  < > = values in this interval not displayed.  INR:  Recent Labs Lab 10/02/15 0425 10/03/15 0455 10/04/15 0440 10/05/15 0528 10/06/15 0407  INR 1.80* 1.69* 1.73* 2.10* 2.87*    Other results:    Imaging: Dg Chest Port 1 View  10/06/2015  CLINICAL DATA:  Left ventricular assist device. EXAM: PORTABLE CHEST 1 VIEW COMPARISON:  10/05/2015. FINDINGS: Right subclavian central line and feeding tube in stable position. AICD in stable position. Left ventricular assist device noted. Severe cardiomegaly. Progressive bilateral pulmonary infiltrates suggesting pulmonary edema. Low lung volumes with bibasilar atelectasis. Bilateral pleural effusions, most prominent on the left. No pneumothorax. IMPRESSION: 1. Lines and tubes in stable position. 2. AICD in stable position. Left ventricular assist device again noted. Severe cardiomegaly with progressive bilateral pulmonary alveolar infiltrates suggesting pulmonary edema. Bilateral pleural effusions, most prominent on the left. 3. Low lung volumes with bibasilar atelectasis. Electronically Signed   By: Maisie Fus  Register   On: 10/06/2015 07:55   Dg Chest Port 1 View  10/05/2015  CLINICAL DATA:  Right central line placement. EXAM: PORTABLE CHEST 1 VIEW COMPARISON:  10/04/2015 FINDINGS: 1045 hours. Right PICC line remains in place with tip positioned at the distal SVC level. A feeding tube passes into the stomach although the distal tip position is not included on the film. Cardiopericardial silhouette is enlarged but stable bibasilar collapse/consolidation with left pleural effusion not substantially changed. There is pulmonary vascular congestion without overt pulmonary edema. The ventricular assist device incompletely visualized. Dual lead pacer/AICD remains visible. Telemetry leads overlie the chest. IMPRESSION: Stable exam. Electronically Signed   By: Kennith Center M.D.   On:  10/05/2015 10:56     Medications:     Scheduled Medications: . amiodarone  200 mg Oral Daily  . anidulafungin  100 mg Intravenous Q24H  . aspirin  81 mg Oral Daily  . docusate sodium  200 mg Oral Daily  . feeding supplement  237 mL Oral TID WC  . feeding supplement (OSMOLITE 1.5 CAL)  1,000 mL Per Tube Q24H  . furosemide  40 mg Intravenous Once  . hydrALAZINE  75 mg Oral 3 times per day  . insulin aspart  0-5 Units Subcutaneous QHS  . insulin aspart  0-9 Units Subcutaneous TID WC  . insulin glargine  14 Units Subcutaneous QHS  . lactobacillus acidophilus & bulgar  1  tablet Oral BID  . [START ON 10/08/2015] levofloxacin  500 mg Oral Q48H  . levofloxacin  750 mg Oral Once  . pantoprazole  40 mg Oral Daily  . sildenafil  40 mg Oral TID  . sodium chloride  10 mL Intravenous Q12H  . sodium chloride  10-40 mL Intracatheter Q12H  . warfarin  3 mg Oral q1800  . Warfarin - Physician Dosing Inpatient   Does not apply q1800    Infusions: . sodium chloride Stopped (09/25/15 1100)  . sodium chloride 250 mL (10/06/15 0400)  . lactated ringers Stopped (09/26/15 0000)  . lactated ringers Stopped (09/24/15 0700)  . milrinone 0.125 mcg/kg/min (10/06/15 0800)    PRN Medications: sodium chloride, Gerhardt's butt cream, hydrALAZINE, ondansetron (ZOFRAN) IV, oxyCODONE, sodium chloride, sodium chloride   Assessment/Plan   1. Acute on chronic systolic HF -> Cardiogenic shock: NICM with EF 5%, mild to moderate RV hypokinesis.  S/p St Jude CRT-D. s/p HM II LVAD placement 09/02/2015. - Stable from cardiac standpoint on milrinone 0.125. Co-ox 63%. Stop milrinone.  CVP 7-8. Weight unchanged. Off diuretics for now.  Renal function trending up.   Add ted hose.  2. Atrial fibrillation: Paroxysmal.    - Maintaining sinus  - On coumadin and po amiodarone. INR  2.87 today. 3. 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   - stable. quiescent 4. AKI on CKD: Creatinine 1.9 prior to admission.  - Up a little. 2.1>2.3 >2.7 Off  diuretics.   5. Pulmonary HTN:   - Continue sildenafil 40 tid. 6. Anticoagulation:  - INR 2.86 => 3.46 => 2.56.=> 2.8 => 1.8 => 1.73=>2.1.=>2.8  Warfarin adjusted - On ASA 81 mg daily.   7. ID: Now with fungemia and stenotrophamonas in sputum -  Improved and anidulafungin. Bactrim stopped this morning and switched to low dose levofloxacin.   -  ID following. WBC coming down.   -8. Hypernatremia -Improved. Todays sodium is 140. Continue to follow 9. HTN -Maps improved. Continue hydralazine to 75 mg tid.  10.Deconditioning - Rehab slowed down due to infection. Recommend CIR. CIR consult placed.  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 Urology consulted before LVAD with foley placed. Yesterday foley removed. Frequent urination reported with tea colored urine as well as clots noted. Send UA. Check bladder scan today.  Restart flomax 0.4 mg daily.   CIR following along with plans for admit once medically stable.   I reviewed the LVAD parameters from today, and compared the results to the patient's prior recorded data.  No programming changes were made.  The LVAD is functioning within specified parameters. LVAD interrogation was negative for any significant power changes, alarms or PI events/speed drops.  LVAD equipment check completed and is in good working order.  Back-up equipment present.    Length of Stay: 20  Amy Clegg NP-C   10/06/2015, 10:15 AM  VAD Team --- VAD ISSUES ONLY--- Pager [phone] (7am - 7am)   Patient seen and examined with Tonye Becket, NP. We discussed all aspects of the encounter. I agree with the assessment and plan as stated above.   Remains very weak but improving slowly. Co-ox ok so can stop milrinone. Still with peripheral edema but creatinine worse so suspect mostly 3rd spacing. Will hold diuretics now. Will try low dose demadex soon.   Foley out. Bladder scan ok. Will add back Flomax.   Continue andulafungin and bactrim for fungemia and PNA. ID  following.   INR 2.8. Warfarin adjusted.   Echo reviewed personally.  Mild RV dysfunction. Good cannula position. Minimal pericardial effusion. AoV remains closed. VAD parameters ok. Continue 9000 speed.   Bensimhon, Daniel,MD 5:37 PM

## 2015-10-06 NOTE — Progress Notes (Signed)
  Echocardiogram 2D Echocardiogram has been performed.  Brad [name] 10/06/2015, 1:14 PM

## 2015-10-06 NOTE — Progress Notes (Signed)
Patient ID: Brad [name], male   DOB: 1940/11/04, 75 y.o.   MRN: [id number]         [facility]    Date of Admission:  09/22/2015   Total days of antibiotics 20        Day 6 anidulafungin        Day 3 trimethoprim sulfamethoxazole         Principal Problem:   Fungemia Active Problems:   Acute on chronic systolic and diastolic heart failure, NYHA class 4 (HCC)   LVAD (left ventricular assist device) present (HCC)   Automatic implantable cardioverter-defibrillator in situ   Cardiogenic shock (HCC)   Acute renal failure superimposed on stage 3 chronic kidney disease (HCC)   PAF (paroxysmal atrial fibrillation) (HCC)   PAH (pulmonary artery hypertension) (HCC)   Dyspnea   Hypotension   Palliative care encounter   CHF (congestive heart failure) (HCC)   AKI (acute kidney injury) (HCC)   HCAP (healthcare-associated pneumonia)   . amiodarone  200 mg Oral Daily  . anidulafungin  100 mg Intravenous Q24H  . aspirin  81 mg Oral Daily  . docusate sodium  200 mg Oral Daily  . feeding supplement  237 mL Oral TID WC  . feeding supplement (OSMOLITE 1.5 CAL)  1,000 mL Per Tube Q24H  . furosemide  40 mg Intravenous Once  . hydrALAZINE  75 mg Oral 3 times per day  . insulin aspart  0-5 Units Subcutaneous QHS  . insulin aspart  0-9 Units Subcutaneous TID WC  . insulin glargine  14 Units Subcutaneous QHS  . lactobacillus acidophilus & bulgar  1 tablet Oral BID  . pantoprazole  40 mg Oral Daily  . sildenafil  40 mg Oral TID  . sodium chloride  10 mL Intravenous Q12H  . sodium chloride  10-40 mL Intracatheter Q12H  . sulfamethoxazole-trimethoprim  320 mg Intravenous Q12H  . warfarin  3 mg Oral q1800  . Warfarin - Physician Dosing Inpatient   Does not apply q1800    SUBJECTIVE: He states that he is feeling better. He denies having any more cough, sputum production or shortness of breath.  Review of Systems: Review of Systems  Constitutional: Negative for  fever, chills and diaphoresis.  Respiratory: Negative for cough, sputum production and shortness of breath.   Cardiovascular: Negative for chest pain.    Past Medical History  Diagnosis Date  . 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 CKD (chronic kidney disease) stage 3, GFR 30-59 ml/min   . V-tach (HCC)   . PAF (paroxysmal atrial fibrillation) (HCC)   . CVA (cerebral infarction)   . BPH (benign prostatic hyperplasia)   . HTN (hypertension), benign   . Hyperlipidemia     Social History  Substance Use Topics  . Smoking status: Never Smoker   . Smokeless tobacco: None  . Alcohol Use: No    Family History  Problem Relation Age of Onset  . Hypertension Mother   . CVA Maternal Aunt    No Known Allergies  OBJECTIVE: Filed Vitals:   10/06/15 0700 10/06/15 0754 10/06/15 0800 10/06/15 0900  BP: 76/59   90/78  Pulse: 91  95 90  Temp:  99.6 F (37.6 C)    TempSrc:  Oral    Resp: 32  27 26  Height:      Weight:      SpO2: 95%  94% 95%  Body mass index is 20.47 kg/(m^2).  Physical Exam  Constitutional: No distress.  He is sitting up in a chair eating breakfast. He appears comfortable.  Cardiovascular: Normal rate and regular rhythm.   No murmur heard. Pulmonary/Chest: He has rales.  AICD and LVAD sites appear normal.  Abdominal: Soft. There is no tenderness.    Lab Results Lab Results  Component Value Date   WBC 15.0* 10/06/2015   HGB 10.0* 10/06/2015   HCT 29.6* 10/06/2015   MCV 87.6 10/06/2015   PLT 725* 10/06/2015    Lab Results  Component Value Date   CREATININE 2.70* 10/06/2015   BUN 42* 10/06/2015   NA 140 10/06/2015   K 4.0 10/06/2015   CL 108 10/06/2015   CO2 23 10/06/2015    Lab Results  Component Value Date   ALT 26 10/01/2015   AST 91* 10/01/2015   ALKPHOS 117 10/01/2015   BILITOT 1.1 10/01/2015     Microbiology: Recent Results (from the past 240 hour(s))  Culture, blood (single)     Status: None    Collection Time: 09/29/15  8:30 AM  Result Value Ref Range Status   Specimen Description BLOOD PICC LINE  Final   Special Requests BOTTLES DRAWN AEROBIC AND ANAEROBIC 10CC  Final   Culture NO GROWTH 5 DAYS  Final   Report Status 10/04/2015 FINAL  Final  Urine culture     Status: None   Collection Time: 09/29/15 10:00 AM  Result Value Ref Range Status   Specimen Description URINE, CATHETERIZED  Final   Special Requests NONE  Final   Culture NO GROWTH 1 DAY  Final   Report Status 09/30/2015 FINAL  Final  Culture, blood (single)     Status: None (Preliminary result)   Collection Time: 09/29/15 11:45 AM  Result Value Ref Range Status   Specimen Description BLOOD LEFT HAND  Final   Special Requests IN PEDIATRIC BOTTLE 3CC  Final   Culture  Setup Time   Final    YEAST IN PEDIATRIC BOTTLE CRITICAL RESULT CALLED TO, READ BACK BY AND VERIFIED WITH: N SARHAM @0729  10/01/15 MKELLY    Culture   Final    CANDIDA SPECIES, NOT ALBICANS TO [address] FOR SENSITIVITY    Report Status PENDING  Incomplete  C difficile quick scan w PCR reflex     Status: None   Collection Time: 09/29/15  1:37 PM  Result Value Ref Range Status   C Diff antigen NEGATIVE NEGATIVE Final   C Diff toxin NEGATIVE NEGATIVE Final   C Diff interpretation Negative for toxigenic C. difficile  Final  Culture, respiratory (NON-Expectorated)     Status: None   Collection Time: 09/30/15  9:27 AM  Result Value Ref Range Status   Specimen Description INDUCED SPUTUM  Final   Special Requests Normal  Final   Gram Stain   Final    MODERATE WBC PRESENT, PREDOMINANTLY PMN MODERATE SQUAMOUS EPITHELIAL CELLS PRESENT RARE GRAM POSITIVE COCCI IN PAIRS RARE YEAST RARE GRAM NEGATIVE RODS    Culture   Final    FEW STENOTROPHOMONAS MALTOPHILIA Performed at [employer] [facility]    Report Status 10/03/2015 FINAL  Final   Organism ID, Bacteria STENOTROPHOMONAS MALTOPHILIA  Final      Susceptibility   Stenotrophomonas  maltophilia - MIC*    TRIMETH/SULFA Value in next row Sensitive      <=20 SENSITIVE(NOTE)    LEVOFLOXACIN Value in next row Sensitive      <=20 SENSITIVE(NOTE)    *  FEW STENOTROPHOMONAS MALTOPHILIA  Culture, body fluid-bottle     Status: None   Collection Time: 09/30/15 10:21 AM  Result Value Ref Range Status   Specimen Description FLUID PLEURAL LEFT  Final   Special Requests NONE  Final   Culture NO GROWTH 5 DAYS  Final   Report Status 10/05/2015 FINAL  Final  Gram stain     Status: None   Collection Time: 09/30/15 10:21 AM  Result Value Ref Range Status   Specimen Description FLUID PLEURAL LEFT  Final   Special Requests NONE  Final   Gram Stain   Final    MODERATE WBC PRESENT,BOTH PMN AND MONONUCLEAR NO ORGANISMS SEEN    Report Status 09/30/2015 FINAL  Final  Culture, blood (routine x 2)     Status: None (Preliminary result)   Collection Time: 10/01/15  9:55 AM  Result Value Ref Range Status   Specimen Description BLOOD LEFT WRIST  Final   Special Requests BOTTLES DRAWN AEROBIC AND ANAEROBIC 5CC  Final   Culture NO GROWTH 4 DAYS  Final   Report Status PENDING  Incomplete  Culture, blood (routine x 2)     Status: None (Preliminary result)   Collection Time: 10/01/15 10:03 AM  Result Value Ref Range Status   Specimen Description BLOOD LEFT HAND  Final   Special Requests IN PEDIATRIC BOTTLE 3CC  Final   Culture NO GROWTH 4 DAYS  Final   Report Status PENDING  Incomplete     ASSESSMENT: I will continue anidulafungin for his transient fungemia pending antifungal susceptibility testing. His chest x-ray is worse today. I suspect volume overload but cannot rule out persistent stenotrophomonas pneumonia. He has worsening renal function. I will change trimethoprim sulfamethoxazole to low-dose levofloxacin to complete therapy for healthcare associated pneumonia.  PLAN: 1. Continue anidulafungin 2. Change trimethoprim sulfamethoxazole to levofloxacin  [name], MD [facility] [phone] pager   [phone] cell 10/06/2015, 9:43 AM

## 2015-10-07 ENCOUNTER — Inpatient Hospital Stay (HOSPITAL_COMMUNITY): Payer: Medicare Other

## 2015-10-07 DIAGNOSIS — Z95810 Presence of automatic (implantable) cardiac defibrillator: Secondary | ICD-10-CM

## 2015-10-07 LAB — BASIC METABOLIC PANEL
Anion gap: 12 (ref 5–15)
BUN: 52 mg/dL — ABNORMAL HIGH (ref 6–20)
CO2: 22 mmol/L (ref 22–32)
Calcium: 9.1 mg/dL (ref 8.9–10.3)
Chloride: 108 mmol/L (ref 101–111)
Creatinine, Ser: 3 mg/dL — ABNORMAL HIGH (ref 0.61–1.24)
GFR calc Af Amer: 22 mL/min — ABNORMAL LOW (ref >60)
GFR calc non Af Amer: 19 mL/min — ABNORMAL LOW (ref >60)
Glucose, Bld: 105 mg/dL — ABNORMAL HIGH (ref 65–99)
Potassium: 4.1 mmol/L (ref 3.5–5.1)
Sodium: 142 mmol/L (ref 135–145)

## 2015-10-07 LAB — CBC
HCT: 24.4 % — ABNORMAL LOW (ref 39.0–52.0)
HCT: 26.2 % — ABNORMAL LOW (ref 39.0–52.0)
Hemoglobin: 8.3 g/dL — ABNORMAL LOW (ref 13.0–17.0)
Hemoglobin: 9 g/dL — ABNORMAL LOW (ref 13.0–17.0)
MCH: 28.5 pg (ref 26.0–34.0)
MCH: 29.1 pg (ref 26.0–34.0)
MCHC: 34 g/dL (ref 30.0–36.0)
MCHC: 34.4 g/dL (ref 30.0–36.0)
MCV: 83.8 fL (ref 78.0–100.0)
MCV: 84.8 fL (ref 78.0–100.0)
Platelets: 740 10*3/uL — ABNORMAL HIGH (ref 150–400)
Platelets: 703 10*3/uL — ABNORMAL HIGH (ref 150–400)
RBC: 2.91 MIL/uL — ABNORMAL LOW (ref 4.22–5.81)
RBC: 3.09 MIL/uL — ABNORMAL LOW (ref 4.22–5.81)
RDW: 16.5 % — ABNORMAL HIGH (ref 11.5–15.5)
RDW: 16 % — ABNORMAL HIGH (ref 11.5–15.5)
WBC: 10.9 10*3/uL — ABNORMAL HIGH (ref 4.0–10.5)
WBC: 10 10*3/uL (ref 4.0–10.5)

## 2015-10-07 LAB — POCT I-STAT, CHEM 8
BUN: 48 mg/dL — AB (ref 6–20)
CALCIUM ION: 1.3 mmol/L (ref 1.13–1.30)
CHLORIDE: 111 mmol/L (ref 101–111)
Creatinine, Ser: 3.3 mg/dL — ABNORMAL HIGH (ref 0.61–1.24)
Glucose, Bld: 60 mg/dL — ABNORMAL LOW (ref 65–99)
HEMATOCRIT: 30 % — AB (ref 39.0–52.0)
Hemoglobin: 10.2 g/dL — ABNORMAL LOW (ref 13.0–17.0)
POTASSIUM: 4.3 mmol/L (ref 3.5–5.1)
SODIUM: 145 mmol/L (ref 135–145)
TCO2: 23 mmol/L (ref 0–100)

## 2015-10-07 LAB — POCT I-STAT 3, ART BLOOD GAS (G3+)
ACID-BASE DEFICIT: 4 mmol/L — AB (ref 0.0–2.0)
Bicarbonate: 21 mEq/L (ref 20.0–24.0)
O2 SAT: 89 %
PH ART: 7.356 (ref 7.350–7.450)
TCO2: 22 mmol/L (ref 0–100)
pCO2 arterial: 37.8 mmHg (ref 35.0–45.0)
pO2, Arterial: 60 mmHg — ABNORMAL LOW (ref 80.0–100.0)

## 2015-10-07 LAB — GLUCOSE, CAPILLARY
GLUCOSE-CAPILLARY: 69 mg/dL (ref 65–99)
GLUCOSE-CAPILLARY: 70 mg/dL (ref 65–99)
GLUCOSE-CAPILLARY: 102 mg/dL — AB (ref 65–99)
Glucose-Capillary: 91 mg/dL (ref 65–99)
Glucose-Capillary: 55 mg/dL — ABNORMAL LOW (ref 65–99)

## 2015-10-07 LAB — CARBOXYHEMOGLOBIN
Carboxyhemoglobin: 1.9 % — ABNORMAL HIGH (ref 0.5–1.5)
Methemoglobin: 0.9 % (ref 0.0–1.5)
O2 Saturation: 64.2 %
Total hemoglobin: 7.8 g/dL — ABNORMAL LOW (ref 13.5–18.0)

## 2015-10-07 LAB — URINE CULTURE: CULTURE: NO GROWTH

## 2015-10-07 LAB — PROCALCITONIN: Procalcitonin: 3.51 ng/mL

## 2015-10-07 LAB — LACTATE DEHYDROGENASE: LDH: 401 U/L — ABNORMAL HIGH (ref 98–192)

## 2015-10-07 LAB — PREPARE RBC (CROSSMATCH)

## 2015-10-07 LAB — PROTIME-INR
INR: 3.34 — ABNORMAL HIGH (ref 0.00–1.49)
Prothrombin Time: 33.2 seconds — ABNORMAL HIGH (ref 11.6–15.2)

## 2015-10-07 MED ORDER — PIPERACILLIN-TAZOBACTAM IN DEX 2-0.25 GM/50ML IV SOLN
2.2500 g | Freq: Four times a day (QID) | INTRAVENOUS | Status: DC
  Administered 2015-10-07 – 2015-10-08 (×2): 2.25 g via INTRAVENOUS
  Filled 2015-10-07 (×4): qty 50

## 2015-10-07 MED ORDER — ACETAMINOPHEN 325 MG PO TABS
650.0000 mg | ORAL_TABLET | Freq: Four times a day (QID) | ORAL | Status: DC | PRN
  Administered 2015-10-08 (×2): 650 mg via ORAL
  Filled 2015-10-07 (×2): qty 2

## 2015-10-07 MED ORDER — HYDRALAZINE HCL 25 MG PO TABS: 25.0000 mg | ORAL_TABLET | Freq: Three times a day (TID) | ORAL | Status: DC

## 2015-10-07 MED ORDER — DEXTROSE 50 % IV SOLN
1.0000 | Freq: Once | INTRAVENOUS | Status: AC
  Administered 2015-10-07: 50 mL via INTRAVENOUS

## 2015-10-07 MED ORDER — PIPERACILLIN-TAZOBACTAM 3.375 G IVPB: 3.3750 g | Freq: Three times a day (TID) | INTRAVENOUS | Status: DC

## 2015-10-07 MED ORDER — CALCIUM CHLORIDE 10 % IV SOLN
1.0000 g | Freq: Once | INTRAVENOUS | Status: AC
  Administered 2015-10-07: 1 g via INTRAVENOUS
  Filled 2015-10-07: qty 10

## 2015-10-07 MED ORDER — VANCOMYCIN HCL IN DEXTROSE 1-5 GM/200ML-% IV SOLN
1000.0000 mg | Freq: Once | INTRAVENOUS | Status: AC
  Administered 2015-10-07: 1000 mg via INTRAVENOUS
  Filled 2015-10-07: qty 200

## 2015-10-07 MED ORDER — DEXTROSE 50 % IV SOLN
INTRAVENOUS | Status: AC
  Administered 2015-10-07: 50 mL
  Filled 2015-10-07: qty 50

## 2015-10-07 MED ORDER — ALBUMIN HUMAN 5 % IV SOLN
12.5000 g | Freq: Once | INTRAVENOUS | Status: AC
  Administered 2015-10-07: 12.5 g via INTRAVENOUS
  Filled 2015-10-07: qty 250

## 2015-10-07 MED ORDER — LIDOCAINE HCL (PF) 1 % IJ SOLN
INTRAMUSCULAR | Status: AC
  Filled 2015-10-07: qty 10

## 2015-10-07 MED ORDER — PIPERACILLIN-TAZOBACTAM IN DEX 2-0.25 GM/50ML IV SOLN
2.2500 g | Freq: Four times a day (QID) | INTRAVENOUS | Status: DC
  Filled 2015-10-07 (×3): qty 50

## 2015-10-07 MED ORDER — DOPAMINE-DEXTROSE 3.2-5 MG/ML-% IV SOLN
2.5000 ug/kg/min | INTRAVENOUS | Status: DC
  Administered 2015-10-07: 2.5 ug/kg/min via INTRAVENOUS
  Filled 2015-10-07: qty 250

## 2015-10-07 MED ORDER — DEXTROSE 50 % IV SOLN
INTRAVENOUS | Status: AC
  Filled 2015-10-07: qty 50

## 2015-10-07 MED ORDER — GLUCOSE-VITAMIN C 4-6 GM-MG PO CHEW
CHEWABLE_TABLET | ORAL | Status: AC
  Filled 2015-10-07: qty 1

## 2015-10-07 NOTE — Progress Notes (Addendum)
Inpatient Diabetes Program Recommendations  AACE/ADA: New Consensus Statement on Inpatient Glycemic Control (2015)  Target Ranges:  Prepandial:   less than 140 mg/dL      Peak postprandial:   less than 180 mg/dL (1-2 hours)      Critically ill patients:  140 - 180 mg/dL   Review of Glycemic Control  Diabetes history: None Outpatient Diabetes medications: None Current orders for Inpatient glycemic control: Lantus 14 QHS, Novolog Sensitive + HS scale  Inpatient Diabetes Program Recommendations: Insulin - Basal: Patient getting tube feeds at night. Patient experiencing hypoglycemia 51 at lunch time today. Patient only received basal no short acting. Please discontinue basal insulin and only give Novolog Correction.  Thanks,  Christena Deem RN, MSN, [facility] Inpatient Diabetes Coordinator Team Pager [phone] (8a-5p)

## 2015-10-07 NOTE — Progress Notes (Signed)
Patient ID: Brad [name], male   DOB: Mar 13, 1941, 75 y.o.   MRN: [id number]    Advanced Heart Failure Rounding Note HeartMate 2 Rounding Note  Subjective:   Admitted from [facility] with cardiogenic shock for advanced heart failure as INTERMACS-1.  On 12/22 foley placed by Urology requiring urethral dilation -> UTI . ? Septic shock. Placed on vanco/zosyn. IABP placed 12/23. S/p HM 2 LVAD placement 10/17/2015.   Developed fever 09/29/15.Pan cultures sent. Switched from zosyn to imipinem. 1/2 cultures + for yeast. Seen by ID and fluconazole switched to andulafungin on 1/5. Rare GNR and GPC in sputum. Vanc stopped 1/6.  S/p chest tube for L effusion (09/30/15) = exudative.   He continues on milrinone 0.2 mcg and torsemide held. CO-OX 64% this morning. MAPs 60-70s. Received albumin overnight and blood. Creatinine up to 3.0. Unable to void. Bladder scan with >480cc urine. Febrile 100.6.  Overall feeling worse.  Denies SOB  Blood Cx 09/29/15- 1/2 yeast ->speciation still pending -Lab sent to [facility] Urine Cx 09/29/15 - NG UA 09/29/15- with few bacteria  C. Diff 09/29/15 - Negative Resp Culture 09/25/16 - Negative. Resp Culture 09/30/15 -R are GNR/GPC -> STENOTROPHOMONAS MALTOPHILIA    LVAD INTERROGATION:  HeartMate II LVAD:  Flow 7.9 liters/min, speed 9000, power 6.8 , PI  4.4   Low PIs noted over night.    Objective:    Vital Signs:   Temp:  [97.7 F (36.5 C)-99.2 F (37.3 C)] 98.7 F (37.1 C) (01/11 0700) Pulse Rate:  [59-104] 90 (01/11 0900) Resp:  [20-36] 32 (01/11 0900) BP: (68-100)/(55-80) 83/60 mmHg (01/11 0700) SpO2:  [93 %-99 %] 95 % (01/11 0900) Last BM Date: 10/06/15 Mean arterial Pressure 70-90s.   Intake/Output:   Intake/Output Summary (Last 24 hours) at 10/07/15 0940 Last data filed at 10/07/15 0900  Gross per 24 hour  Intake 1403.93 ml  Output    510 ml  Net 893.93 ml     Physical Exam: CVP ~10 General: NAD, Elderly appearing. Weak. In bed.   HEENT: normal x for  panda Neck: supple.  JVP 10 cm.  Carotids 2+ bilat; no bruits. No thyromegaly or nodule noted.  Cor: PMI nondisplaced. RRR. LVAD hum present.  Lungs: Decreased bases bilaterally. Abdomen: soft, NT, mildly distended , no HSM. No bruits or masses. +BS  Extremities: no cyanosis, clubbing, rash. RUE PICC  Rand LLE 2+ edema  Neuro: alert & orientedx3, cranial nerves grossly intact. moves all 4 extremities w/o difficulty. Affect flat  Telemetry: reviewed personally, a sensed v pacing 90s   Labs: Basic Metabolic Panel:  Recent Labs Lab 10/03/15 0455  10/04/15 0440 10/05/15 0528 10/05/15 1704 10/06/15 0510 10/07/15 0502  NA 151*  < > 144 142 143 140 142  K 3.4*  < > 3.6 4.0 4.0 4.0 4.1  CL 114*  < > 110 108 106 108 108  CO2 26  --  23 24  --  23 22  GLUCOSE 220*  < > 293* 196* 177* 202* 105*  BUN 38*  < > 34* 38* 37* 42* 52*  CREATININE 2.25*  < > 2.13* 2.37* 2.20* 2.70* 3.00*  CALCIUM 9.3  --  9.0 9.1  --  8.6* 9.1  < > = values in this interval not displayed.  Liver Function Tests:  Recent Labs Lab 10/01/15 0415  AST 91*  ALT 26  ALKPHOS 117  BILITOT 1.1  PROT 5.7*  ALBUMIN 2.9*   No results for input(s): LIPASE, AMYLASE in  the last 168 hours. No results for input(s): AMMONIA in the last 168 hours.  CBC:  Recent Labs Lab 10/03/15 0455  10/04/15 0440 10/05/15 0528 10/05/15 1704 10/06/15 0407 10/07/15 0502  WBC 14.7*  --  18.4* 17.5*  --  15.0* 10.9*  HGB 10.6*  < > 10.7* 10.8* 11.9* 10.0* 8.3*  HCT 31.2*  < > 30.6* 31.4* 35.0* 29.6* 24.4*  MCV 83.6  --  84.1 83.3  --  87.6 83.8  PLT 572*  --  638* 761*  --  725* 740*  < > = values in this interval not displayed.  INR:  Recent Labs Lab 10/03/15 0455 10/04/15 0440 10/05/15 0528 10/06/15 0407 10/07/15 0502  INR 1.69* 1.73* 2.10* 2.87* 3.34*    Other results:    Imaging: Dg Chest Port 1 View  10/07/2015  CLINICAL DATA:  Heart failure. EXAM: PORTABLE CHEST 1 VIEW COMPARISON:  10/06/2015. FINDINGS:  Orogastric tube and right PICC line stable position. AICD in stable position. Left ventricular assist device again noted. Severe cardiomegaly with bilateral pulmonary interstitial prominence and bilateral pleural effusions again noted consistent congestive heart failure. Left pleural effusion may be loculated. Low lung volumes with basilar atelectasis . No pneumothorax. IMPRESSION: 1. Lines and tubes in stable position. 2. Prior median sternotomy. AICD and left ventricular assist device in stable position. Severe cardiomegaly with changes of bilateral pulmonary interstitial edema pleural effusions. Left pleural effusion may be loculated. Similar findings noted on prior exam. 3. Low lung volumes with basilar atelectasis. Electronically Signed   By: Maisie Fus  Register   On: 10/07/2015 08:12   Dg Chest Port 1 View  10/06/2015  CLINICAL DATA:  Left ventricular assist device. EXAM: PORTABLE CHEST 1 VIEW COMPARISON:  10/05/2015. FINDINGS: Right subclavian central line and feeding tube in stable position. AICD in stable position. Left ventricular assist device noted. Severe cardiomegaly. Progressive bilateral pulmonary infiltrates suggesting pulmonary edema. Low lung volumes with bibasilar atelectasis. Bilateral pleural effusions, most prominent on the left. No pneumothorax. IMPRESSION: 1. Lines and tubes in stable position. 2. AICD in stable position. Left ventricular assist device again noted. Severe cardiomegaly with progressive bilateral pulmonary alveolar infiltrates suggesting pulmonary edema. Bilateral pleural effusions, most prominent on the left. 3. Low lung volumes with bibasilar atelectasis. Electronically Signed   By: Maisie Fus  Register   On: 10/06/2015 07:55   Dg Chest Port 1 View  10/05/2015  CLINICAL DATA:  Right central line placement. EXAM: PORTABLE CHEST 1 VIEW COMPARISON:  10/04/2015 FINDINGS: 1045 hours. Right PICC line remains in place with tip positioned at the distal SVC level. A feeding tube passes  into the stomach although the distal tip position is not included on the film. Cardiopericardial silhouette is enlarged but stable bibasilar collapse/consolidation with left pleural effusion not substantially changed. There is pulmonary vascular congestion without overt pulmonary edema. The ventricular assist device incompletely visualized. Dual lead pacer/AICD remains visible. Telemetry leads overlie the chest. IMPRESSION: Stable exam. Electronically Signed   By: Kennith Center M.D.   On: 10/05/2015 10:56   Dg Abd Portable 1v  10/06/2015  CLINICAL DATA:  Shortness of breath.  Abdominal distention EXAM: PORTABLE ABDOMEN - 1 VIEW COMPARISON:  09/30/2015 FINDINGS: Visible portions of left ventricular assist device show no appreciable change. There is a feeding tube with unchanged morphology, either coiled in the antrum or directed into the gastric bulb. Diffuse gas in the small and large bowel without evidence of obstruction. IMPRESSION: 1. Diffuse bowel gas without indication of obstruction. 2. Feeding  tube tip in the antrum or duodenal bulb. Electronically Signed   By: Marnee Spring M.D.   On: 10/06/2015 12:22     Medications:     Scheduled Medications: . amiodarone  200 mg Oral Daily  . anidulafungin  100 mg Intravenous Q24H  . aspirin  81 mg Oral Daily  . docusate sodium  200 mg Oral Daily  . feeding supplement  237 mL Oral TID WC  . feeding supplement (OSMOLITE 1.5 CAL)  1,000 mL Per Tube Q24H  . furosemide  40 mg Intravenous Once  . hydrALAZINE  25 mg Oral 3 times per day  . insulin aspart  0-5 Units Subcutaneous QHS  . insulin aspart  0-9 Units Subcutaneous TID WC  . insulin glargine  14 Units Subcutaneous QHS  . lactobacillus acidophilus & bulgar  1 tablet Oral BID  . [START ON 10/08/2015] levofloxacin  500 mg Oral Q48H  . lidocaine (PF)      . pantoprazole  40 mg Oral Daily  . sildenafil  40 mg Oral TID  . sodium chloride  10 mL Intravenous Q12H  . sodium chloride  10-40 mL  Intracatheter Q12H  . tamsulosin  0.4 mg Oral QPC supper  . Warfarin - Physician Dosing Inpatient   Does not apply q1800    Infusions: . sodium chloride Stopped (09/25/15 1100)  . sodium chloride 250 mL (10/07/15 0400)  . lactated ringers Stopped (09/26/15 0000)  . lactated ringers Stopped (09/24/15 0700)  . milrinone 0.2 mcg/kg/min (10/07/15 0800)    PRN Medications: sodium chloride, Gerhardt's butt cream, hydrALAZINE, ondansetron (ZOFRAN) IV, oxyCODONE, sodium chloride, sodium chloride   Assessment/Plan   1. Acute on chronic systolic HF -> Cardiogenic shock: NICM with EF 5%, mild to moderate RV hypokinesis.  S/p [employer] CRT-D. s/p HM II LVAD placement 09/19/2015. - Stable from cardiac standpoint on milrinone 0.125. Co-ox 63%. Stop milrinone.  CVP ~10. Weight unchanged. Off diuretics for now.  Renal function trending up.   Add ted hose.  2. Atrial fibrillation: Paroxysmal.    - Maintaining sinus  - On coumadin and po amiodarone. INR 3.34  today. 3. 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   - stable. quiescent 4. AKI on CKD: Creatinine 1.9 prior to admission.  - Up a little. 2.1>2.3 >2.7 >3.0 Off diuretics.  Urinary retention likely playing a role.  5. Pulmonary HTN:   - Continue sildenafil 40 tid. 6. Anticoagulation:  - INR 2.86 => 3.46 => 2.56.=> 2.8 => 1.8 => 1.73=>2.1.=>2.8=>3.34   Per Dr Maren Beach.  - On ASA 81 mg daily.   7. ID: Now with fungemia and stenotrophamonas in sputum -  Improved and anidulafungid and levofloxacin.   -  ID following. WBC coming down.Tmax 100.5 . Urine culture pending.  -8. Hypernatremia -Todays sodium is 142.  9. HTN -Received albumin over night. Maps low. Hold Hydralazine.   10.Deconditioning - Rehab slowed down due to infection. Recommend CIR. CIR following. Continue PT/OT.   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 Urology consulted before LVAD with foley placed. Foley was removed 10/05/15. Urine culture sent. Initially able to void but today unable to  void. Bladder scan today with 480 cc urine. Will call urology to place catheter.  12. Anemia- Hgb down to 8.3. Receiving 1 UPRBC today.  13. L Pleural Effusion- plan for thoracentesis Friday.   CIR following along with plans for admit once medically stable.   I reviewed the LVAD parameters from today, and compared the results to the  patient's prior recorded data.  No programming changes were made.  The LVAD is functioning within specified parameters. LVAD interrogation was negative for any significant power changes, alarms or PI events/speed drops.  LVAD equipment check completed and is in good working order.  Back-up equipment present.    Length of Stay: 21  Amy Clegg NP-C   10/07/2015, 9:40 AM  VAD Team --- VAD ISSUES ONLY--- Pager [phone] (7am - 7am)   Patient seen and examined with Tonye Becket, NP. We discussed all aspects of the encounter. I agree with the assessment and plan as stated above.   He is much worse today with recurrent fevers, worsening renal function, increased dyspnea with enlarging effusion and progressive anemia. Concern over bleeding in left chest. He has got FFP and blood. Foley also had to be replaced.   Continue antifungal and abx regimen. Plan thoracentesis Friday. Wife updated. Prognosis very concerning. VAD parameters checked personally at bedside.   The patient is critically ill with multiple organ systems failure and requires high complexity decision making for assessment and support, frequent evaluation and titration of therapies, application of advanced monitoring technologies and extensive interpretation of multiple databases.   Critical Care Time devoted to patient care services described in this note is 45 Minutes.  Bensimhon, Daniel,MD 4:56 PM

## 2015-10-07 NOTE — Progress Notes (Signed)
Hypoglycemic Event  CBG: 69  Treatment: Dextrose 50% (1 amp)  Symptoms: lethargic  Follow-up CBG: Time:1330 CBG Result:139  Possible Reasons for Event: Patient not eating well; refusing to eat this AM  Comments/MD notified: MD aware    Sharon Mt

## 2015-10-07 NOTE — Progress Notes (Signed)
Patient ID: Brad [name], male   DOB: August 11, 1941, 75 y.o.   MRN: [id number]  SICU Evening Rounds:  Hemodynamically stable on milrinone 0.125 and dop 2.5.  CVP 12 MAP 80 Pump parameters stable. Urine output 20-25, creat up a little this evening to 3.3 Hgb improved after transfusion. Low grade fever to 100.6 today. Lethargic, not eating, increased respiratory rate. CXR this am showed left pleural effusion and LLL consolidation. Vanc restarted for possible pneumonia and plan chest tube in am to drain effusion.

## 2015-10-07 NOTE — Progress Notes (Signed)
LVAD Coordinator Advanced Heart Failure Rounds:  HM II LVAD Implanted 09/15/2015 as DT by Dr. Donata Clay.   Awake in the bed today. Tachypneic and having more difficulty breathing with RR in 30s. Unable to void with 480 cc in bladder.   Vital signs: Temp: 100.6 deg HR: 90s A sensed, V paced  ICD SJM CRT-D (defib therapy off, pacing on) Doppler BP: 76  Auto cuff BP: 80/70  MAP 76 O2 Sat: 94 - 95%   1 L  Wt:142.6 lbs (preop) > 161 > 155 > 150 > 143 > 149 > 142 > 139   LVAD interrogation reveals:  Speed: 9000 Flow: 8.4 Power: 6.5w PI: 5.2   Alarms: none Events: 9 PI today and 5 PI yesterday  Fixed speed: 9000 Low speed limit: 8400  Back up controller programmed accordingly and is at the bedside. LVAD is functioning within expected parameters. No programming changes were made this morning. Some power elevations noted on interrogation a/w PI events. Highest wattage 9.3 - 10.0 w on 10/05/15 noted. Not sustained.   Labs:  LDH trend: 372 >... > 392 > 408 > 450 > 401  INR trend: 1.21 > ...  > 1.69 > 1.73 > 2.10 > 3.34  Hgb: 11.6 > 10.4 > 10.1 > 10.6 > 11.0 > 10.6 > 10.1 > 10.5 > 8.3  WBC:  8.9 > ... > 16.4 > 14.7 > 18.4 > 17.5 > 10.9  Co-Ox: 64% Creatinine: 1.6 (preop) > ... > 3.20 > ... > 1.90 > 2.32 > 2.00 > 2.13 > 2.37 > 3.0  Antithrombotic Management: 09/24/15--> 325 mg ASA   81 mg now 09/24/15--> Warfarin started  Infusions: Milrinone 0.2 Dopamine 2.5   Epi off 09/27/15 Levo off 09/27/15  OR Blood Products: 4 u FFP 2 u Platelets Cell saver  ICU Blood Products:  09/17/2015--> 2 FFP, 2 pRBC (Hgb 7) 09/24/15 - 2 units pRBC 10/01/15 --> 2 u PRBC 10/07/15 --> 2 uPRBC  Ventilator: Extubated 09/25/15  NO weaned off. On Sildenafil 40 mg TID for PAH   Infection: 1/2 BCs + for candida 09/29/2015  anidulafungin (ERAXIS) 100 mg QD 10/01/15 >> current  imipenem-cilastatin (Primaxin) 250 mg Q8H 09/29/15 >> current  Sputum >> stenotrophamonas on 09/30/2015  Levofloxacin 10/06/15  >> current  Septra 10/04/15 >> 10/06/15  Respiratory Events: CT placed for effusion.  11/06/15 --> plan for pleurex catheter drain with recurrent effusions (PVT)   Plan as discussed with team: 1. MAPs lower today--hydralazine on hold.   2. Urology replacing foley as he is not able to void d/t retention r/t his BPH. Continue flomax.    2. Some power elevations noted on interrogation as above. Continue to monitor. No turbulence to pump with auscultation. + hematuria however d/t BPH and recent foley removal. LDHs have been trending up.   3. Ensure supplementation for now--if he cannot maintain this outpatient at current dose due to cost will need to wean him off prior to D/C to adjust coumadin dose.   4. With PI's dropping <4 occasionally would recommend to doppler BP first then follow up with automatic BP cuff. When pulsatility is low the automatic BP cuff is not the most reliable method of measurement.   5. D/C teaching will need to be rescheduled as he is having a procedure for pleurex catheter placement on Friday. Discussed this with him and will call his wife to reschedule for early next week. Planning disposition at D/C to CIR.   Rexene Alberts, BSN,  RN, CCRN VAD Coordinator   Office: [phone] 24/7 VAD Pager: [phone]

## 2015-10-07 NOTE — Progress Notes (Signed)
Called Ms. Yongue to cancel teaching session because of procedure on Friday. Discussed plan for Pleurex drain and advised that we would have Dr. Donata Clay update her as soon as he is able. She is very worried about her husband and states that he is very fearful as well even though he does not outwardly share it with [place]. She asked very specific questions about prognosis, and will he turn around, etc. Discussed that he is still in critical care and very tenuous and we are worried about him. She is very concerned over his infections and needs when he will need to go home.   She is adamant that she does not want him in any rehab/SNF in [place] based on her previous experiences with other family members. Would like to know details about when he can go up to CIR--discussed that there is not an estimated time frame currently because he still has significant medical needs at this time.   Rexene Alberts, RN VAD Coordinator   Office: [phone] 24/7 VAD Pager: [phone]

## 2015-10-07 NOTE — Consult Note (Signed)
Urology Consult  Referring physician: Prescott Gum, P Reason for referral: Cannot insert foley  Chief Complaint: Cannot insert foley  History of Present Illness: Patient known to Urology; not voiding well with elevated PVR; saw urology Dec 21: consult: 75 yo M with complex PHM hospitalized with cardiogenic shock. Attempt at placement of 69F coude catheter was unsuccessful with significant resistance noted at the prostatic urethra. Cystourethroscopy was performed at the bedside and demonstrated evidence of multiple dense adhesions between the bilateral lateral lobes of the prostate causing obstruction. A wire was passed through a small opening anteriorly and a 47F council catheter was used to "dilate" the opening and advanced successfully into the bladder with return of clear, yellow urine.  For re two days voiding small amouts and PVR re 477m No acute distress Modifying factors: There are no other modifying factors  Associated signs and symptoms: There are no other associated signs and symptoms Aggravating and relieving factors: There are no other aggravating or relieving factors Severity: Moderate Duration: Persistent  Past Medical History  Diagnosis Date  . 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 CKD (chronic kidney disease) stage 3, GFR 30-59 ml/min   . V-tach ([facility]   . PAF (paroxysmal atrial fibrillation) ([facility]   . CVA (cerebral infarction)   . BPH (benign prostatic hyperplasia)   . HTN (hypertension), benign   . Hyperlipidemia    Past Surgical History  Procedure Laterality Date  . Cardiac catheterization N/A 09/17/2015    Procedure: Right Heart Cath;  Surgeon: DJolaine Artist MD;  [place] [facility];  Service: Cardiovascular;  Laterality: N/A;  . Cardiac catheterization N/A 08/27/2015    Procedure: IABP Insertion;  Surgeon: DJolaine Artist MD;  [facility];  Service: Cardiovascular;  Laterality: N/A;  .  Insertion of implantable left ventricular assist device N/A 09/01/2015    Procedure: INSERTION OF IMPLANTABLE LEFT VENTRICULAR ASSIST DEVICE;  Surgeon: PIvin Poot MD;  [place] [facility]  Service: Open Heart Surgery;  Laterality: N/A;  CIRC ARREST  NITRIC OXIDE  . Tee without cardioversion N/A 09/21/2015    Procedure: TRANSESOPHAGEAL ECHOCARDIOGRAM (TEE);  Surgeon: PIvin Poot MD;  [place] [facility]  Service: Open Heart Surgery;  Laterality: N/A;    Medications: I have reviewed the patient's current medications. Allergies: No Known Allergies  Family History  Problem Relation Age of Onset  . Hypertension Mother   . CVA Maternal Aunt    Social History:  reports that he has never smoked. He does not have any smokeless tobacco history on file. He reports that he does not drink alcohol or use illicit drugs.  ROS: All systems are reviewed and negative except as noted. Rest negative  Physical Exam:  Vital signs in last 24 hours: Temp:  [97.7 F (36.5 C)-100.6 F (38.1 C)] 100.6 F (38.1 C) (01/11 1005) Pulse Rate:  [59-97] 95 (01/11 0945) Resp:  [20-36] 30 (01/11 0945) BP: (68-100)/(55-80) 83/60 mmHg (01/11 0700) SpO2:  [93 %-99 %] 93 % (01/11 0945)  Cardiovascular: Skin warm; not flushed Respiratory: Breaths quiet; no shortness of breath Abdomen: No masses Neurological: Normal sensation to touch Musculoskeletal: Normal motor function arms and legs Lymphatics: No inguinal adenopathy Skin: No rashes Genitourinary: male genitalia normal  Laboratory Data:  Results for orders placed or performed during the hospital encounter of 09/10/2015 (from the past 72 hour(s))  Glucose, capillary     Status: Abnormal  Collection Time: 10/04/15 12:14 PM  Result Value Ref Range   Glucose-Capillary 156 (H) 65 - 99 mg/dL   Comment 1 Capillary Specimen    Comment 2 Notify RN   Glucose, capillary     Status: Abnormal   Collection Time: 10/04/15  5:40 PM  Result Value Ref Range    Glucose-Capillary 156 (H) 65 - 99 mg/dL   Comment 1 Capillary Specimen    Comment 2 Notify RN   Glucose, capillary     Status: Abnormal   Collection Time: 10/04/15  9:28 PM  Result Value Ref Range   Glucose-Capillary 145 (H) 65 - 99 mg/dL   Comment 1 Notify RN    Comment 2 Document in Chart   Carboxyhemoglobin (Co-ox)     Status: Abnormal   Collection Time: 10/05/15  5:00 AM  Result Value Ref Range   Total hemoglobin 10.3 (L) 13.5 - 18.0 g/dL   O2 Saturation 68.6 %   Carboxyhemoglobin 2.1 (H) 0.5 - 1.5 %   Methemoglobin 1.2 0.0 - 1.5 %  Lactate dehydrogenase     Status: Abnormal   Collection Time: 10/05/15  5:28 AM  Result Value Ref Range   LDH 450 (H) 98 - 192 U/L  Protime-INR     Status: Abnormal   Collection Time: 10/05/15  5:28 AM  Result Value Ref Range   Prothrombin Time 23.4 (H) 11.6 - 15.2 seconds   INR 2.10 (H) 0.00 - 1.49  CBC     Status: Abnormal   Collection Time: 10/05/15  5:28 AM  Result Value Ref Range   WBC 17.5 (H) 4.0 - 10.5 K/uL   RBC 3.77 (L) 4.22 - 5.81 MIL/uL   Hemoglobin 10.8 (L) 13.0 - 17.0 g/dL   HCT 31.4 (L) 39.0 - 52.0 %   MCV 83.3 78.0 - 100.0 fL   MCH 28.6 26.0 - 34.0 pg   MCHC 34.4 30.0 - 36.0 g/dL   RDW 16.0 (H) 11.5 - 15.5 %   Platelets 761 (H) 150 - 400 K/uL  Basic metabolic panel     Status: Abnormal   Collection Time: 10/05/15  5:28 AM  Result Value Ref Range   Sodium 142 135 - 145 mmol/L   Potassium 4.0 3.5 - 5.1 mmol/L   Chloride 108 101 - 111 mmol/L   CO2 24 22 - 32 mmol/L   Glucose, Bld 196 (H) 65 - 99 mg/dL   BUN 38 (H) 6 - 20 mg/dL   Creatinine, Ser 2.37 (H) 0.61 - 1.24 mg/dL   Calcium 9.1 8.9 - 10.3 mg/dL   GFR calc non Af Amer 25 (L) >60 mL/min   GFR calc Af Amer 29 (L) >60 mL/min    Comment: (NOTE) The eGFR has been calculated using the CKD EPI equation. This calculation has not been validated in all clinical situations. eGFR's persistently <60 mL/min signify possible Chronic Kidney Disease.    Anion gap 10 5 - 15   Glucose, capillary     Status: Abnormal   Collection Time: 10/05/15  8:43 AM  Result Value Ref Range   Glucose-Capillary 184 (H) 65 - 99 mg/dL   Comment 1 Notify RN   Culture, blood (single)     Status: None (Preliminary result)   Collection Time: 10/05/15 10:32 AM  Result Value Ref Range   Specimen Description BLOOD CENTRAL LINE    Special Requests BOTTLES DRAWN AEROBIC AND ANAEROBIC 5CC    Culture NO GROWTH 1 DAY    Report Status PENDING  Glucose, capillary     Status: Abnormal   Collection Time: 10/05/15 12:46 PM  Result Value Ref Range   Glucose-Capillary 110 (H) 65 - 99 mg/dL   Comment 1 Notify RN   Glucose, capillary     Status: Abnormal   Collection Time: 10/05/15  4:59 PM  Result Value Ref Range   Glucose-Capillary 166 (H) 65 - 99 mg/dL  I-STAT, chem 8     Status: Abnormal   Collection Time: 10/05/15  5:04 PM  Result Value Ref Range   Sodium 143 135 - 145 mmol/L   Potassium 4.0 3.5 - 5.1 mmol/L   Chloride 106 101 - 111 mmol/L   BUN 37 (H) 6 - 20 mg/dL   Creatinine, Ser 2.20 (H) 0.61 - 1.24 mg/dL   Glucose, Bld 177 (H) 65 - 99 mg/dL   Calcium, Ion 1.23 1.13 - 1.30 mmol/L   TCO2 20 0 - 100 mmol/L   Hemoglobin 11.9 (L) 13.0 - 17.0 g/dL   HCT 35.0 (L) 39.0 - 52.0 %  Glucose, capillary     Status: Abnormal   Collection Time: 10/05/15  9:44 PM  Result Value Ref Range   Glucose-Capillary 117 (H) 65 - 99 mg/dL   Comment 1 Notify RN    Comment 2 Document in Chart   Carboxyhemoglobin (Co-ox)     Status: Abnormal   Collection Time: 10/06/15  3:52 AM  Result Value Ref Range   Total hemoglobin 9.9 (L) 13.5 - 18.0 g/dL   O2 Saturation 63.1 %   Carboxyhemoglobin 2.1 (H) 0.5 - 1.5 %   Methemoglobin 0.9 0.0 - 1.5 %  Protime-INR     Status: Abnormal   Collection Time: 10/06/15  4:07 AM  Result Value Ref Range   Prothrombin Time 29.6 (H) 11.6 - 15.2 seconds   INR 2.87 (H) 0.00 - 1.49  CBC     Status: Abnormal   Collection Time: 10/06/15  4:07 AM  Result Value Ref Range    WBC 15.0 (H) 4.0 - 10.5 K/uL   RBC 3.38 (L) 4.22 - 5.81 MIL/uL   Hemoglobin 10.0 (L) 13.0 - 17.0 g/dL   HCT 29.6 (L) 39.0 - 52.0 %   MCV 87.6 78.0 - 100.0 fL   MCH 29.6 26.0 - 34.0 pg   MCHC 33.8 30.0 - 36.0 g/dL   RDW 17.1 (H) 11.5 - 15.5 %   Platelets 725 (H) 150 - 400 K/uL  Basic metabolic panel     Status: Abnormal   Collection Time: 10/06/15  5:10 AM  Result Value Ref Range   Sodium 140 135 - 145 mmol/L   Potassium 4.0 3.5 - 5.1 mmol/L   Chloride 108 101 - 111 mmol/L   CO2 23 22 - 32 mmol/L   Glucose, Bld 202 (H) 65 - 99 mg/dL   BUN 42 (H) 6 - 20 mg/dL   Creatinine, Ser 2.70 (H) 0.61 - 1.24 mg/dL   Calcium 8.6 (L) 8.9 - 10.3 mg/dL   GFR calc non Af Amer 22 (L) >60 mL/min   GFR calc Af Amer 25 (L) >60 mL/min    Comment: (NOTE) The eGFR has been calculated using the CKD EPI equation. This calculation has not been validated in all clinical situations. eGFR's persistently <60 mL/min signify possible Chronic Kidney Disease.    Anion gap 9 5 - 15  Lactate dehydrogenase     Status: Abnormal   Collection Time: 10/06/15  5:10 AM  Result Value Ref Range   LDH  484 (H) 98 - 192 U/L  Glucose, capillary     Status: Abnormal   Collection Time: 10/06/15  7:52 AM  Result Value Ref Range   Glucose-Capillary 100 (H) 65 - 99 mg/dL   Comment 1 Capillary Specimen    Comment 2 Notify RN   Glucose, capillary     Status: Abnormal   Collection Time: 10/06/15  1:14 PM  Result Value Ref Range   Glucose-Capillary 121 (H) 65 - 99 mg/dL   Comment 1 Capillary Specimen    Comment 2 Notify RN   Urinalysis, Routine w reflex microscopic (not at [facility])     Status: Abnormal   Collection Time: 10/06/15  4:30 PM  Result Value Ref Range   Color, Urine RED (A) YELLOW    Comment: BIOCHEMICALS MAY BE AFFECTED BY COLOR   APPearance TURBID (A) CLEAR   Specific Gravity, Urine 1.015 1.005 - 1.030   pH 5.5 5.0 - 8.0   Glucose, UA NEGATIVE NEGATIVE mg/dL   Hgb urine dipstick LARGE (A) NEGATIVE    Bilirubin Urine NEGATIVE NEGATIVE   Ketones, ur 15 (A) NEGATIVE mg/dL   Protein, ur 100 (A) NEGATIVE mg/dL   Nitrite NEGATIVE NEGATIVE   Leukocytes, UA SMALL (A) NEGATIVE  Culture, Urine     Status: None (Preliminary result)   Collection Time: 10/06/15  4:30 PM  Result Value Ref Range   Specimen Description URINE, CLEAN CATCH    Special Requests Levaquin, anidulafungin    Culture NO GROWTH < 24 HOURS    Report Status PENDING   Urine microscopic-add on     Status: Abnormal   Collection Time: 10/06/15  4:30 PM  Result Value Ref Range   Squamous Epithelial / LPF NONE SEEN NONE SEEN   WBC, UA 6-30 0 - 5 WBC/hpf   RBC / HPF TOO NUMEROUS TO COUNT 0 - 5 RBC/hpf   Bacteria, UA RARE (A) NONE SEEN   Casts GRANULAR CAST (A) NEGATIVE   Urine-Other MUCOUS PRESENT     Comment: URINALYSIS PERFORMED ON SUPERNATANT  Glucose, capillary     Status: Abnormal   Collection Time: 10/06/15  4:54 PM  Result Value Ref Range   Glucose-Capillary 102 (H) 65 - 99 mg/dL   Comment 1 Capillary Specimen    Comment 2 Notify RN   Glucose, capillary     Status: Abnormal   Collection Time: 10/06/15  9:47 PM  Result Value Ref Range   Glucose-Capillary 125 (H) 65 - 99 mg/dL   Comment 1 Capillary Specimen    Comment 2 Notify RN    Comment 3 Document in Chart   Carboxyhemoglobin (Co-ox)     Status: Abnormal   Collection Time: 10/07/15  4:45 AM  Result Value Ref Range   Total hemoglobin 7.8 (L) 13.5 - 18.0 g/dL   O2 Saturation 64.2 %   Carboxyhemoglobin 1.9 (H) 0.5 - 1.5 %   Methemoglobin 0.9 0.0 - 1.5 %  Lactate dehydrogenase     Status: Abnormal   Collection Time: 10/07/15  5:02 AM  Result Value Ref Range   LDH 401 (H) 98 - 192 U/L  Protime-INR     Status: Abnormal   Collection Time: 10/07/15  5:02 AM  Result Value Ref Range   Prothrombin Time 33.2 (H) 11.6 - 15.2 seconds   INR 3.34 (H) 0.00 - 7.03  Basic metabolic panel     Status: Abnormal   Collection Time: 10/07/15  5:02 AM  Result Value Ref Range  Sodium 142 135 - 145 mmol/L   Potassium 4.1 3.5 - 5.1 mmol/L   Chloride 108 101 - 111 mmol/L   CO2 22 22 - 32 mmol/L   Glucose, Bld 105 (H) 65 - 99 mg/dL   BUN 52 (H) 6 - 20 mg/dL   Creatinine, Ser 3.00 (H) 0.61 - 1.24 mg/dL   Calcium 9.1 8.9 - 10.3 mg/dL   GFR calc non Af Amer 19 (L) >60 mL/min   GFR calc Af Amer 22 (L) >60 mL/min    Comment: (NOTE) The eGFR has been calculated using the CKD EPI equation. This calculation has not been validated in all clinical situations. eGFR's persistently <60 mL/min signify possible Chronic Kidney Disease.    Anion gap 12 5 - 15  CBC     Status: Abnormal   Collection Time: 10/07/15  5:02 AM  Result Value Ref Range   WBC 10.9 (H) 4.0 - 10.5 K/uL   RBC 2.91 (L) 4.22 - 5.81 MIL/uL   Hemoglobin 8.3 (L) 13.0 - 17.0 g/dL   HCT 24.4 (L) 39.0 - 52.0 %   MCV 83.8 78.0 - 100.0 fL   MCH 28.5 26.0 - 34.0 pg   MCHC 34.0 30.0 - 36.0 g/dL   RDW 16.5 (H) 11.5 - 15.5 %   Platelets 740 (H) 150 - 400 K/uL  Glucose, capillary     Status: None   Collection Time: 10/07/15  8:12 AM  Result Value Ref Range   Glucose-Capillary 91 65 - 99 mg/dL   Comment 1 Notify RN   Type and screen [place]     Status: None (Preliminary result)   Collection Time: 10/07/15  8:24 AM  Result Value Ref Range   ABO/RH(D) B POS    Antibody Screen NEG    Sample Expiration 10/10/2015    Unit Number [id number]    Blood Component Type RED CELLS,LR    Unit division 00    Status of Unit ISSUED    Transfusion Status OK TO TRANSFUSE    Crossmatch Result Compatible   Prepare RBC     Status: None   Collection Time: 10/07/15  8:24 AM  Result Value Ref Range   Order Confirmation ORDER PROCESSED BY BLOOD BANK    Recent Results (from the past 240 hour(s))  Culture, blood (single)     Status: None   Collection Time: 09/29/15  8:30 AM  Result Value Ref Range Status   Specimen Description BLOOD PICC LINE  Final   Special Requests BOTTLES DRAWN AEROBIC AND  ANAEROBIC 10CC  Final   Culture NO GROWTH 5 DAYS  Final   Report Status 10/04/2015 FINAL  Final  Urine culture     Status: None   Collection Time: 09/29/15 10:00 AM  Result Value Ref Range Status   Specimen Description URINE, CATHETERIZED  Final   Special Requests NONE  Final   Culture NO GROWTH 1 DAY  Final   Report Status 09/30/2015 FINAL  Final  Culture, blood (single)     Status: None (Preliminary result)   Collection Time: 09/29/15 11:45 AM  Result Value Ref Range Status   Specimen Description BLOOD LEFT HAND  Final   Special Requests IN PEDIATRIC BOTTLE 3CC  Final   Culture  Setup Time   Final    YEAST IN PEDIATRIC BOTTLE CRITICAL RESULT CALLED TO, READ BACK BY AND VERIFIED WITH: N SARHAM _0  10/01/15 MKELLY    Culture   Final    CANDIDA SPECIES,  NOT ALBICANS TO [address] FOR SENSITIVITY    Report Status PENDING  Incomplete  C difficile quick scan w PCR reflex     Status: None   Collection Time: 09/29/15  1:37 PM  Result Value Ref Range Status   C Diff antigen NEGATIVE NEGATIVE Final   C Diff toxin NEGATIVE NEGATIVE Final   C Diff interpretation Negative for toxigenic C. difficile  Final  Culture, respiratory (NON-Expectorated)     Status: None   Collection Time: 09/30/15  9:27 AM  Result Value Ref Range Status   Specimen Description INDUCED SPUTUM  Final   Special Requests Normal  Final   Gram Stain   Final    MODERATE WBC PRESENT, PREDOMINANTLY PMN MODERATE SQUAMOUS EPITHELIAL CELLS PRESENT RARE GRAM POSITIVE COCCI IN PAIRS RARE YEAST RARE GRAM NEGATIVE RODS    Culture   Final    FEW STENOTROPHOMONAS MALTOPHILIA Performed at [facility]    Report Status 10/03/2015 FINAL  Final   Organism ID, Bacteria STENOTROPHOMONAS MALTOPHILIA  Final      Susceptibility   Stenotrophomonas maltophilia - MIC*    TRIMETH/SULFA Value in next row Sensitive      <=20 SENSITIVE(NOTE)    LEVOFLOXACIN Value in next row Sensitive      <=20 SENSITIVE(NOTE)    *  FEW STENOTROPHOMONAS MALTOPHILIA  Culture, body fluid-bottle     Status: None   Collection Time: 09/30/15 10:21 AM  Result Value Ref Range Status   Specimen Description FLUID PLEURAL LEFT  Final   Special Requests NONE  Final   Culture NO GROWTH 5 DAYS  Final   Report Status 10/05/2015 FINAL  Final  Gram stain     Status: None   Collection Time: 09/30/15 10:21 AM  Result Value Ref Range Status   Specimen Description FLUID PLEURAL LEFT  Final   Special Requests NONE  Final   Gram Stain   Final    MODERATE WBC PRESENT,BOTH PMN AND MONONUCLEAR NO ORGANISMS SEEN    Report Status 09/30/2015 FINAL  Final  Culture, blood (routine x 2)     Status: None   Collection Time: 10/01/15  9:55 AM  Result Value Ref Range Status   Specimen Description BLOOD LEFT WRIST  Final   Special Requests BOTTLES DRAWN AEROBIC AND ANAEROBIC 5CC  Final   Culture NO GROWTH 5 DAYS  Final   Report Status 10/06/2015 FINAL  Final  Culture, blood (routine x 2)     Status: None   Collection Time: 10/01/15 10:03 AM  Result Value Ref Range Status   Specimen Description BLOOD LEFT HAND  Final   Special Requests IN PEDIATRIC BOTTLE 3CC  Final   Culture NO GROWTH 5 DAYS  Final   Report Status 10/06/2015 FINAL  Final  Culture, blood (single)     Status: None (Preliminary result)   Collection Time: 10/05/15 10:32 AM  Result Value Ref Range Status   Specimen Description BLOOD CENTRAL LINE  Final   Special Requests BOTTLES DRAWN AEROBIC AND ANAEROBIC 5CC  Final   Culture NO GROWTH 1 DAY  Final   Report Status PENDING  Incomplete  Culture, Urine     Status: None (Preliminary result)   Collection Time: 10/06/15  4:30 PM  Result Value Ref Range Status   Specimen Description URINE, CLEAN CATCH  Final   Special Requests Levaquin, anidulafungin  Final   Culture NO GROWTH < 24 HOURS  Final   Report Status PENDING  Incomplete  Creatinine:  Recent Labs  10/03/15 0455 10/03/15 1529 10/04/15 0440 10/05/15 0528  10/05/15 1704 10/06/15 0510 10/07/15 0502  CREATININE 2.25* 2.00* 2.13* 2.37* 2.20* 2.70* 3.00*    Xrays: See report/chart none  Impression/Assessment:  Unable to pass catheter in cardiac patient  Plan:  16 Fr coude went in easy Leave foley in F/up with Dr Louis Meckel as an outpt with foley Start flomax of OK with cardiac status   Dr Bjorn Loser

## 2015-10-07 NOTE — Progress Notes (Signed)
RT obtained ABG per order, RT let RN know values of ABG. RT to monitor as needed

## 2015-10-07 NOTE — Progress Notes (Signed)
Patient ID: Brad [name], male   DOB: [birth year], 75 y.o.   MRN: [id number]         [facility]    Date of Admission:  08/28/2015   Total days of antibiotics 21        Day 7 anidulafungin        Day levofloxacin         Principal Problem:   Fungemia Active Problems:   Acute on chronic systolic and diastolic heart failure, NYHA class 4 (HCC)   LVAD (left ventricular assist device) present (HCC)   Automatic implantable cardioverter-defibrillator in situ   Cardiogenic shock (HCC)   Acute renal failure superimposed on stage 3 chronic kidney disease (HCC)   PAF (paroxysmal atrial fibrillation) (HCC)   PAH (pulmonary artery hypertension) (HCC)   Dyspnea   Hypotension   Palliative care encounter   CHF (congestive heart failure) (HCC)   AKI (acute kidney injury) (HCC)   HCAP (healthcare-associated pneumonia)   . amiodarone  200 mg Oral Daily  . anidulafungin  100 mg Intravenous Q24H  . aspirin  81 mg Oral Daily  . docusate sodium  200 mg Oral Daily  . feeding supplement  237 mL Oral TID WC  . feeding supplement (OSMOLITE 1.5 CAL)  1,000 mL Per Tube Q24H  . furosemide  40 mg Intravenous Once  . insulin aspart  0-5 Units Subcutaneous QHS  . insulin aspart  0-9 Units Subcutaneous TID WC  . insulin glargine  14 Units Subcutaneous QHS  . lactobacillus acidophilus & bulgar  1 tablet Oral BID  . [START ON 10/08/2015] levofloxacin  500 mg Oral Q48H  . pantoprazole  40 mg Oral Daily  . sildenafil  40 mg Oral TID  . sodium chloride  10 mL Intravenous Q12H  . sodium chloride  10-40 mL Intracatheter Q12H  . tamsulosin  0.4 mg Oral QPC supper  . Warfarin - Physician Dosing Inpatient   Does not apply q1800    SUBJECTIVE: He is having some lower abdominal discomfort. He denies cough and shortness of breath.  Review of Systems: Review of Systems  Constitutional: Negative for fever, chills and diaphoresis.  Respiratory: Negative for cough, sputum production  and shortness of breath.   Cardiovascular: Negative for chest pain.  Gastrointestinal: Positive for abdominal pain.    Past Medical History  Diagnosis Date  . 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 CKD (chronic kidney disease) stage 3, GFR 30-59 ml/min   . V-tach (HCC)   . PAF (paroxysmal atrial fibrillation) (HCC)   . CVA (cerebral infarction)   . BPH (benign prostatic hyperplasia)   . HTN (hypertension), benign   . Hyperlipidemia     Social History  Substance Use Topics  . Smoking status: Never Smoker   . Smokeless tobacco: None  . Alcohol Use: No    Family History  Problem Relation Age of Onset  . Hypertension Mother   . CVA Maternal Aunt    No Known Allergies  OBJECTIVE: Filed Vitals:   10/07/15 0900 10/07/15 0940 10/07/15 0945 10/07/15 1005  BP:      Pulse: 90  95   Temp:  100.5 F (38.1 C)  100.6 F (38.1 C)  TempSrc:  Oral  Oral  Resp: 32  30   Height:      Weight:      SpO2: 95%  93%    Body mass index is  20.47 kg/(m^2).  Physical Exam  Constitutional: No distress.  He is alert and comfortable resting in bed. He is sitting up in a chair eating breakfast. He appears comfortable.  Cardiovascular: Normal rate and regular rhythm.   No murmur heard. Pulmonary/Chest: He has rales.  AICD and LVAD sites appear normal.  Abdominal: Soft. There is no tenderness.    Lab Results Lab Results  Component Value Date   WBC 10.9* 10/07/2015   HGB 8.3* 10/07/2015   HCT 24.4* 10/07/2015   MCV 83.8 10/07/2015   PLT 740* 10/07/2015    Lab Results  Component Value Date   CREATININE 3.00* 10/07/2015   BUN 52* 10/07/2015   NA 142 10/07/2015   K 4.1 10/07/2015   CL 108 10/07/2015   CO2 22 10/07/2015    Lab Results  Component Value Date   ALT 26 10/01/2015   AST 91* 10/01/2015   ALKPHOS 117 10/01/2015   BILITOT 1.1 10/01/2015     Microbiology: Recent Results (from the past 240 hour(s))  Culture, blood (single)      Status: None   Collection Time: 09/29/15  8:30 AM  Result Value Ref Range Status   Specimen Description BLOOD PICC LINE  Final   Special Requests BOTTLES DRAWN AEROBIC AND ANAEROBIC 10CC  Final   Culture NO GROWTH 5 DAYS  Final   Report Status 10/04/2015 FINAL  Final  Urine culture     Status: None   Collection Time: 09/29/15 10:00 AM  Result Value Ref Range Status   Specimen Description URINE, CATHETERIZED  Final   Special Requests NONE  Final   Culture NO GROWTH 1 DAY  Final   Report Status 09/30/2015 FINAL  Final  Culture, blood (single)     Status: None (Preliminary result)   Collection Time: 09/29/15 11:45 AM  Result Value Ref Range Status   Specimen Description BLOOD LEFT HAND  Final   Special Requests IN PEDIATRIC BOTTLE 3CC  Final   Culture  Setup Time   Final    YEAST IN PEDIATRIC BOTTLE CRITICAL RESULT CALLED TO, READ BACK BY AND VERIFIED WITH: N SARHAM @0729  10/01/15 MKELLY    Culture   Final    CANDIDA SPECIES, NOT ALBICANS TO [address] FOR SENSITIVITY    Report Status PENDING  Incomplete  C difficile quick scan w PCR reflex     Status: None   Collection Time: 09/29/15  1:37 PM  Result Value Ref Range Status   C Diff antigen NEGATIVE NEGATIVE Final   C Diff toxin NEGATIVE NEGATIVE Final   C Diff interpretation Negative for toxigenic C. difficile  Final  Culture, respiratory (NON-Expectorated)     Status: None   Collection Time: 09/30/15  9:27 AM  Result Value Ref Range Status   Specimen Description INDUCED SPUTUM  Final   Special Requests Normal  Final   Gram Stain   Final    MODERATE WBC PRESENT, PREDOMINANTLY PMN MODERATE SQUAMOUS EPITHELIAL CELLS PRESENT RARE GRAM POSITIVE COCCI IN PAIRS RARE YEAST RARE GRAM NEGATIVE RODS    Culture   Final    FEW STENOTROPHOMONAS MALTOPHILIA Performed at [facility]    Report Status 10/03/2015 FINAL  Final   Organism ID, Bacteria STENOTROPHOMONAS MALTOPHILIA  Final      Susceptibility    Stenotrophomonas maltophilia - MIC*    TRIMETH/SULFA Value in next row Sensitive      <=20 SENSITIVE(NOTE)    LEVOFLOXACIN Value in next row Sensitive      <=  20 SENSITIVE(NOTE)    * FEW STENOTROPHOMONAS MALTOPHILIA  Culture, body fluid-bottle     Status: None   Collection Time: 09/30/15 10:21 AM  Result Value Ref Range Status   Specimen Description FLUID PLEURAL LEFT  Final   Special Requests NONE  Final   Culture NO GROWTH 5 DAYS  Final   Report Status 10/05/2015 FINAL  Final  Gram stain     Status: None   Collection Time: 09/30/15 10:21 AM  Result Value Ref Range Status   Specimen Description FLUID PLEURAL LEFT  Final   Special Requests NONE  Final   Gram Stain   Final    MODERATE WBC PRESENT,BOTH PMN AND MONONUCLEAR NO ORGANISMS SEEN    Report Status 09/30/2015 FINAL  Final  Culture, blood (routine x 2)     Status: None   Collection Time: 10/01/15  9:55 AM  Result Value Ref Range Status   Specimen Description BLOOD LEFT WRIST  Final   Special Requests BOTTLES DRAWN AEROBIC AND ANAEROBIC 5CC  Final   Culture NO GROWTH 5 DAYS  Final   Report Status 10/06/2015 FINAL  Final  Culture, blood (routine x 2)     Status: None   Collection Time: 10/01/15 10:03 AM  Result Value Ref Range Status   Specimen Description BLOOD LEFT HAND  Final   Special Requests IN PEDIATRIC BOTTLE 3CC  Final   Culture NO GROWTH 5 DAYS  Final   Report Status 10/06/2015 FINAL  Final  Culture, blood (single)     Status: None (Preliminary result)   Collection Time: 10/05/15 10:32 AM  Result Value Ref Range Status   Specimen Description BLOOD CENTRAL LINE  Final   Special Requests BOTTLES DRAWN AEROBIC AND ANAEROBIC 5CC  Final   Culture NO GROWTH 1 DAY  Final   Report Status PENDING  Incomplete  Culture, Urine     Status: None (Preliminary result)   Collection Time: 10/06/15  4:30 PM  Result Value Ref Range Status   Specimen Description URINE, CLEAN CATCH  Final   Special Requests Levaquin,  anidulafungin  Final   Culture NO GROWTH < 24 HOURS  Final   Report Status PENDING  Incomplete     ASSESSMENT: I will continue anidulafungin and levofloxacin for now.  PLAN: 1. Continue anidulafungin   [name], MD [facility] [phone] pager   [phone] cell 10/07/2015, 11:34 AM

## 2015-10-07 NOTE — Progress Notes (Signed)
CT Surgery  Brad [name] appears to be septic from probable L side pneumonia, temp 100.4 Foley placed and culture sent He has developed acute on chronic renal failure and will prob need CVVH tomorrow He is not eating at all His central line has been changed twice and surgical incisions are clean and dry. Blood culture negative after antifungal started Will place L chest tube for recurrent L effusion ( malnutrition and nonambulatory) at bedside tomorrow after INR is lower- FFP gven today VAD function and position are fine by Echo this week, RV function adequate, L effusion noted Will discuss his deterioration with wife on phone - probable sepsis from underlying frail and malnourished state related to chronic HF  Plan : Empiric antibiotics- Vanc, Zosyn, place chest tube to drain effusion in am if INR better Will cont full support with vent and CVVH if needed for now

## 2015-10-08 ENCOUNTER — Inpatient Hospital Stay (HOSPITAL_COMMUNITY): Payer: Medicare Other

## 2015-10-08 DIAGNOSIS — R6521 Severe sepsis with septic shock: Secondary | ICD-10-CM

## 2015-10-08 DIAGNOSIS — N189 Chronic kidney disease, unspecified: Secondary | ICD-10-CM

## 2015-10-08 DIAGNOSIS — A419 Sepsis, unspecified organism: Secondary | ICD-10-CM | POA: Insufficient documentation

## 2015-10-08 DIAGNOSIS — Z960 Presence of urogenital implants: Secondary | ICD-10-CM

## 2015-10-08 DIAGNOSIS — N179 Acute kidney failure, unspecified: Secondary | ICD-10-CM | POA: Insufficient documentation

## 2015-10-08 LAB — CBC
HCT: 26.7 % — ABNORMAL LOW (ref 39.0–52.0)
Hemoglobin: 8.9 g/dL — ABNORMAL LOW (ref 13.0–17.0)
MCH: 29.2 pg (ref 26.0–34.0)
MCHC: 33.3 g/dL (ref 30.0–36.0)
MCV: 87.5 fL (ref 78.0–100.0)
Platelets: 718 10*3/uL — ABNORMAL HIGH (ref 150–400)
RBC: 3.05 MIL/uL — ABNORMAL LOW (ref 4.22–5.81)
RDW: 17.5 % — ABNORMAL HIGH (ref 11.5–15.5)
WBC: 10.3 10*3/uL (ref 4.0–10.5)

## 2015-10-08 LAB — POCT I-STAT, CHEM 8
BUN: 67 mg/dL — AB (ref 6–20)
CALCIUM ION: 1.27 mmol/L (ref 1.13–1.30)
CHLORIDE: 108 mmol/L (ref 101–111)
Creatinine, Ser: 3.7 mg/dL — ABNORMAL HIGH (ref 0.61–1.24)
GLUCOSE: 241 mg/dL — AB (ref 65–99)
HCT: 28 % — ABNORMAL LOW (ref 39.0–52.0)
Hemoglobin: 9.5 g/dL — ABNORMAL LOW (ref 13.0–17.0)
POTASSIUM: 4.2 mmol/L (ref 3.5–5.1)
SODIUM: 142 mmol/L (ref 135–145)
TCO2: 21 mmol/L (ref 0–100)

## 2015-10-08 LAB — BASIC METABOLIC PANEL
Anion gap: 8 (ref 5–15)
Anion gap: 9 (ref 5–15)
BUN: 63 mg/dL — ABNORMAL HIGH (ref 6–20)
BUN: 62 mg/dL — AB (ref 6–20)
CALCIUM: 9 mg/dL (ref 8.9–10.3)
CO2: 24 mmol/L (ref 22–32)
CO2: 23 mmol/L (ref 22–32)
Calcium: 8.9 mg/dL (ref 8.9–10.3)
Chloride: 111 mmol/L (ref 101–111)
Chloride: 111 mmol/L (ref 101–111)
Creatinine, Ser: 3.75 mg/dL — ABNORMAL HIGH (ref 0.61–1.24)
Creatinine, Ser: 3.85 mg/dL — ABNORMAL HIGH (ref 0.61–1.24)
GFR calc Af Amer: 17 mL/min — ABNORMAL LOW (ref >60)
GFR calc Af Amer: 16 mL/min — ABNORMAL LOW (ref >60)
GFR calc non Af Amer: 15 mL/min — ABNORMAL LOW (ref >60)
GFR, EST NON AFRICAN AMERICAN: 14 mL/min — AB (ref >60)
Glucose, Bld: 132 mg/dL — ABNORMAL HIGH (ref 65–99)
Glucose, Bld: 135 mg/dL — ABNORMAL HIGH (ref 65–99)
POTASSIUM: 4.8 mmol/L (ref 3.5–5.1)
Potassium: 6.7 mmol/L (ref 3.5–5.1)
SODIUM: 143 mmol/L (ref 135–145)
Sodium: 143 mmol/L (ref 135–145)

## 2015-10-08 LAB — URINE CULTURE: Culture: NO GROWTH

## 2015-10-08 LAB — GLUCOSE, CAPILLARY
GLUCOSE-CAPILLARY: 139 mg/dL — AB (ref 65–99)
GLUCOSE-CAPILLARY: 120 mg/dL — AB (ref 65–99)
Glucose-Capillary: 150 mg/dL — ABNORMAL HIGH (ref 65–99)
Glucose-Capillary: 60 mg/dL — ABNORMAL LOW (ref 65–99)
Glucose-Capillary: 216 mg/dL — ABNORMAL HIGH (ref 65–99)
Glucose-Capillary: 234 mg/dL — ABNORMAL HIGH (ref 65–99)
Glucose-Capillary: 155 mg/dL — ABNORMAL HIGH (ref 65–99)

## 2015-10-08 LAB — PREPARE FRESH FROZEN PLASMA: Unit division: 0

## 2015-10-08 LAB — BLOOD GAS, ARTERIAL
Acid-base deficit: 3.9 mmol/L — ABNORMAL HIGH (ref 0.0–2.0)
BICARBONATE: 20.7 meq/L (ref 20.0–24.0)
DRAWN BY: 418751
FIO2: 0.55
O2 SAT: 91.8 %
Patient temperature: 98.6
TCO2: 21.8 mmol/L (ref 0–100)
pCO2 arterial: 37.7 mmHg (ref 35.0–45.0)
pH, Arterial: 7.358 (ref 7.350–7.450)
pO2, Arterial: 62.7 mmHg — ABNORMAL LOW (ref 80.0–100.0)

## 2015-10-08 LAB — PROCALCITONIN: Procalcitonin: 3.74 ng/mL

## 2015-10-08 LAB — CARBOXYHEMOGLOBIN
CARBOXYHEMOGLOBIN: 1.5 % (ref 0.5–1.5)
Carboxyhemoglobin: 1.5 % (ref 0.5–1.5)
METHEMOGLOBIN: 1.1 % (ref 0.0–1.5)
Methemoglobin: 1 % (ref 0.0–1.5)
O2 SAT: 83.3 %
O2 Saturation: 84 %
TOTAL HEMOGLOBIN: 8.6 g/dL — AB (ref 13.5–18.0)
Total hemoglobin: 8.8 g/dL — ABNORMAL LOW (ref 13.5–18.0)

## 2015-10-08 LAB — LACTATE DEHYDROGENASE
LDH: 522 U/L — ABNORMAL HIGH (ref 98–192)
LDH: 383 U/L — AB (ref 98–192)

## 2015-10-08 LAB — PROTIME-INR
INR: 2.68 — AB (ref 0.00–1.49)
Prothrombin Time: 28.1 seconds — ABNORMAL HIGH (ref 11.6–15.2)

## 2015-10-08 MED ORDER — INSULIN GLARGINE 100 UNIT/ML ~~LOC~~ SOLN
14.0000 [IU] | Freq: Two times a day (BID) | SUBCUTANEOUS | Status: DC
  Administered 2015-10-08 – 2015-10-09 (×4): 14 [IU] via SUBCUTANEOUS
  Filled 2015-10-08 (×5): qty 0.14

## 2015-10-08 MED ORDER — EPINEPHRINE HCL 1 MG/ML IJ SOLN
8.0000 ug/min | INTRAVENOUS | Status: DC
  Administered 2015-10-08: 4 ug/min via INTRAVENOUS
  Administered 2015-10-08: 2 ug/min via INTRAVENOUS
  Administered 2015-10-09 – 2015-10-13 (×6): 3 ug/min via INTRAVENOUS
  Administered 2015-10-15 (×3): 8 ug/min via INTRAVENOUS
  Filled 2015-10-08 (×12): qty 4

## 2015-10-08 MED ORDER — ALBUMIN HUMAN 5 % IV SOLN
12.5000 g | Freq: Once | INTRAVENOUS | Status: AC
  Administered 2015-10-08: 12.5 g via INTRAVENOUS
  Filled 2015-10-08: qty 250

## 2015-10-08 MED ORDER — NOREPINEPHRINE BITARTRATE 1 MG/ML IV SOLN
5.0000 ug/min | INTRAVENOUS | Status: DC
  Administered 2015-10-08: 5 ug/min via INTRAVENOUS
  Administered 2015-10-11: 7 ug/min via INTRAVENOUS
  Administered 2015-10-12 – 2015-10-14 (×2): 4 ug/min via INTRAVENOUS
  Filled 2015-10-08 (×6): qty 16

## 2015-10-08 MED ORDER — VANCOMYCIN HCL IN DEXTROSE 1-5 GM/200ML-% IV SOLN
1000.0000 mg | INTRAVENOUS | Status: DC
  Filled 2015-10-08: qty 200

## 2015-10-08 MED ORDER — OSMOLITE 1.5 CAL PO LIQD
1000.0000 mL | ORAL | Status: DC
  Administered 2015-10-08 – 2015-10-09 (×2): 1000 mL
  Administered 2015-10-10: 30 mL
  Filled 2015-10-08 (×6): qty 1000

## 2015-10-08 MED ORDER — OSMOLITE 1.5 CAL PO LIQD
1000.0000 mL | ORAL | Status: DC
  Filled 2015-10-08 (×2): qty 1000

## 2015-10-08 MED ORDER — DEXTROSE 5 % IV SOLN
1.0000 g | INTRAVENOUS | Status: DC
  Administered 2015-10-08 – 2015-10-09 (×2): 1 g via INTRAVENOUS
  Filled 2015-10-08 (×2): qty 1

## 2015-10-08 MED ORDER — ALBUMIN HUMAN 5 % IV SOLN
12.5000 g | INTRAVENOUS | Status: AC | PRN
  Administered 2015-10-08 (×2): 12.5 g via INTRAVENOUS

## 2015-10-08 MED ORDER — LIDOCAINE HCL (PF) 1 % IJ SOLN
INTRAMUSCULAR | Status: AC
  Administered 2015-10-08: 5 mL
  Filled 2015-10-08: qty 5

## 2015-10-08 MED ORDER — WARFARIN SODIUM 1 MG PO TABS
1.0000 mg | ORAL_TABLET | Freq: Every day | ORAL | Status: DC
  Administered 2015-10-08: 1 mg via ORAL
  Filled 2015-10-08 (×2): qty 1

## 2015-10-08 NOTE — Progress Notes (Signed)
CT surgery HeartMate 2 Rounding Note POD #15  Heartmate 2 implantation   Subjective:   Class 4 CHF with cardiogenic shock, hx nonischemic    Cardiomyopathy Preop IABP Hx BPH, TURP and bladder outlet obstruction required cystoscopy to place foley\ probable UTI preop Preop severe protein loss malnutrition, prealbumin < 8 Pre and Post-op acute on chronic renal failure Preop guaiac + stool Preop chronic Eliquis for a-fib Postop acute on chronic anemia Postop fungemia Postop L pleural effusion requiring chest tube x2-   Patient continues deterioration with clinical signs of sepsis. Antibiotic coverage has been increased. Patient has been replaced on pressors   Chest x-ray shows recurring left pleural effusion--patient previously had chest tube . We'll place a second left chest tube to drain effusion and sent for culture.  Creatinine continues to rise now 3.7, urine output decreased Echocardiogram  shows good unloading of the LV but with persistent moderate RV dysfunction so we'll continue milrinone , and epinephrine and norepinephrine as needed to optimize renal blood flow and renal vascular gradient    Incisions clean, dry   INR now  2.7 - coumadin 1 mg  continue aspirin 81 mg VAD parameters satisfactory w/o PI events  Currently on HS tube feeds for protein malnutrition  LVAD INTERROGATION:  HeartMate II LVAD:  Flow 8.3 liters/min, speed 9000 rpm, power 5.2, PI 3.8  Controller intact  Objective:    Vital Signs:   Temp:  [98.4 F (36.9 C)-101.5 F (38.6 C)] 98.5 F (36.9 C) (01/12 1206) Pulse Rate:  [88-91] 90 (01/12 1300) Resp:  [17-33] 19 (01/12 1300) SpO2:  [90 %-100 %] 96 % (01/12 1300) FiO2 (%):  [55 %] 55 % (01/12 0800) Weight:  [143 lb 8.3 oz (65.1 kg)] 143 lb 8.3 oz (65.1 kg) (01/12 0500) Last BM Date: 10/06/15 Mean arterial Pressure 90-95  Intake/Output:   Intake/Output Summary (Last 24 hours) at 10/08/15 1439 Last data filed at 10/08/15 1300  Gross per 24  hour  Intake 3324.67 ml  Output   1755 ml  Net 1569.67 ml     Physical Exam: General:  Appears weak and fragile.. No resp difficulty HEENT: normal Neck: supple. no JVP  No carotid pulses; no bruits. No lymphadenopathy or thryomegaly appreciated. Cor: Mechanical heart sounds with LVAD hum present. Lungs: clear. Improved breath sounds after placement of left chest tube Abdomen: soft, nontender, nondistended. No hepatosplenomegaly. No bruits or masses. Good bowel sounds. Extremities: no cyanosis, clubbing, rash, mild- mod extremity  edema Neuro: alert & orientedx3, cranial nerves grossly intact.             Generally weak with poor ambulation ability after              developing fungemia  Telemetry: paced rhythm 90  Labs: Basic Metabolic Panel:  Recent Labs Lab 10/05/15 0528  10/06/15 0510 10/07/15 0502 10/07/15 1712 10/08/15 0335 10/08/15 0530 10/08/15 1248  NA 142  < > 140 142 145 143 143 142  K 4.0  < > 4.0 4.1 4.3 6.7* 4.8 4.2  CL 108  < > 108 108 111 111 111 108  CO2 24  --  23 22  --  24 23  --   GLUCOSE 196*  < > 202* 105* 60* 132* 135* 241*  BUN 38*  < > 42* 52* 48* 63* 62* 67*  CREATININE 2.37*  < > 2.70* 3.00* 3.30* 3.75* 3.85* 3.70*  CALCIUM 9.1  --  8.6* 9.1  --  8.9 9.0  --   < > =  values in this interval not displayed.  Liver Function Tests: No results for input(s): AST, ALT, ALKPHOS, BILITOT, PROT, ALBUMIN in the last 168 hours. No results for input(s): LIPASE, AMYLASE in the last 168 hours. No results for input(s): AMMONIA in the last 168 hours.  CBC:  Recent Labs Lab 10/05/15 0528  10/06/15 0407 10/07/15 0502 10/07/15 1300 10/07/15 1712 10/08/15 0335 10/08/15 1248  WBC 17.5*  --  15.0* 10.9* 10.0  --  10.3  --   HGB 10.8*  < > 10.0* 8.3* 9.0* 10.2* 8.9* 9.5*  HCT 31.4*  < > 29.6* 24.4* 26.2* 30.0* 26.7* 28.0*  MCV 83.3  --  87.6 83.8 84.8  --  87.5  --   PLT 761*  --  725* 740* 703*  --  718*  --   < > = values in this interval not  displayed.  INR:  Recent Labs Lab 10/04/15 0440 10/05/15 0528 10/06/15 0407 10/07/15 0502 10/08/15 0430  INR 1.73* 2.10* 2.87* 3.34* 2.68*    Other results:  EKG:   Imaging: Dg Chest Port 1 View  10/08/2015  CLINICAL DATA:  Status post left-sided chest tube placement EXAM: PORTABLE CHEST 1 VIEW COMPARISON:  Study obtained earlier in the day FINDINGS: There is no a chest tube on the left. There has been a marked diminution of pleural effusion on the left following chest tube placement. On the minimal left-sided pleural effusion remains. No pneumothorax is evident. There is a small right pleural effusion. There is mild bibasilar atelectasis. There is a left ventricular assist device. There are pacemaker leads attached to the right and left heart, stable. Heart is enlarged with pulmonary vascularity within normal limits. No adenopathy evident. Feeding tube tip is below the diaphragm and not seen. Central catheter tip is at the cavoatrial junction, stable. IMPRESSION: Marked diminution of left-sided pleural effusion following chest tube placement on the left. There is a minimal left effusion currently with a small persistent right effusion, stable. No edema or consolidation. Stable cardiac enlargement. Tube. And catheter positions as described. No pneumothorax. Electronically Signed   By: Bretta Bang III M.D.   On: 10/08/2015 09:43   Dg Chest Port 1 View  10/08/2015  CLINICAL DATA:  Shortness of breath, acute and chronic systolic and diastolic heart failure, left ventricular assist device in place, acute renal failure, pump anemia. EXAM: PORTABLE CHEST 1 VIEW COMPARISON:  Portable chest x-ray of October 07, 2015 FINDINGS: There has been further increase in interstitial density in the left upper lobe. Persistent alveolar opacity throughout the mid and lower left lung is present. The cardiac silhouette remains enlarged. There is a small right pleural effusion. The implantable pacemaker  defibrillator is in stable position. The left ventricular assist device is also in stable position. The pulmonary vascularity is mildly engorged. The feeding tube tip projects below the inferior margin of the image. The PICC line tip projects over the junction of the SVC with the right atrium. IMPRESSION: There has been slight interval worsening in the appearance of the mid and upper left lung interstitium consistent with atelectasis or less likely pneumonia. There is persistent left basilar opacity consistent with pleural fluid and atelectasis. A persistent small right pleural effusion is demonstrated. Stable enlargement of the cardiac silhouette with mild central pulmonary vascular congestion. The support apparatus is in stable position. Electronically Signed   By: David  [place] M.D.   On: 10/08/2015 07:52   Dg Chest Port 1 View  10/07/2015  CLINICAL DATA:  Heart failure. EXAM: PORTABLE CHEST 1 VIEW COMPARISON:  10/06/2015. FINDINGS: Orogastric tube and right PICC line stable position. AICD in stable position. Left ventricular assist device again noted. Severe cardiomegaly with bilateral pulmonary interstitial prominence and bilateral pleural effusions again noted consistent congestive heart failure. Left pleural effusion may be loculated. Low lung volumes with basilar atelectasis . No pneumothorax. IMPRESSION: 1. Lines and tubes in stable position. 2. Prior median sternotomy. AICD and left ventricular assist device in stable position. Severe cardiomegaly with changes of bilateral pulmonary interstitial edema pleural effusions. Left pleural effusion may be loculated. Similar findings noted on prior exam. 3. Low lung volumes with basilar atelectasis. Electronically Signed   By: Maisie Fus  Register   On: 10/07/2015 08:12     Medications:     Scheduled Medications: . albumin human  12.5 g Intravenous Once  . amiodarone  200 mg Oral Daily  . anidulafungin  100 mg Intravenous Q24H  . aspirin  81 mg Oral  Daily  . ceFEPime (MAXIPIME) IV  1 g Intravenous Q24H  . docusate sodium  200 mg Oral Daily  . feeding supplement  237 mL Oral TID WC  . furosemide  40 mg Intravenous Once  . insulin aspart  0-5 Units Subcutaneous QHS  . insulin aspart  0-9 Units Subcutaneous TID WC  . insulin glargine  14 Units Subcutaneous BID  . lactobacillus acidophilus & bulgar  1 tablet Oral BID  . levofloxacin  500 mg Oral Q48H  . pantoprazole  40 mg Oral Daily  . sildenafil  40 mg Oral TID  . sodium chloride  10 mL Intravenous Q12H  . sodium chloride  10-40 mL Intracatheter Q12H  . [START ON 10/08/2015] vancomycin  1,000 mg Intravenous Q48H  . warfarin  1 mg Oral q1800  . Warfarin - Physician Dosing Inpatient   Does not apply q1800    Infusions: . sodium chloride Stopped (09/25/15 1100)  . sodium chloride 250 mL (10/07/15 0400)  . epinephrine 4 mcg/min (10/08/15 1300)  . feeding supplement (OSMOLITE 1.5 CAL) 1,000 mL (10/08/15 1200)  . lactated ringers Stopped (09/26/15 0000)  . lactated ringers Stopped (09/24/15 0700)  . milrinone 0.25 mcg/kg/min (10/08/15 1300)  . norepinephrine (LEVOPHED) Adult infusion 8 mcg/min (10/08/15 1300)    PRN Medications: sodium chloride, acetaminophen, Gerhardt's butt cream, hydrALAZINE, ondansetron (ZOFRAN) IV, oxyCODONE, sodium chloride, sodium chloride   Assessment:  VAD flow parameters and function are satisfactory Echocardiogram performed earlier this week shows adequate RV function and good decompression of LV.  Patient's clinical condition however has deteriorated-  Started with Candida species in the  the blood and now with clinical severe sepsis and multi-system failure. I discussed the situation with the patient's wife and she understands his deterioration and poor outlook unless things improve significantly.   Plan/Discussion:      I reviewed the LVAD parameters from today, and compared the results to the patient's prior recorded data.  No programming  changes were made.  The LVAD is functioning within specified parameters.  The patient performs LVAD self-test daily.  LVAD interrogation was negative for any significant power changes, alarms or PI events/speed drops.  LVAD equipment check completed and is in good working order.  Back-up equipment present.   LVAD education done on emergency procedures and precautions and reviewed exit site care.  Length of Stay: [address]  Kathlee Nations [place] III 10/08/2015, 2:39 PM

## 2015-10-08 NOTE — Progress Notes (Signed)
CSW attempted to visit with patient although was resting comfortably and CSW did not want to wake up. CSW spoke with RN about current health status and plan. Patient's wife had planned to stay home today and will visit patient tomorrow. CSW will continue to follow throughout recovery for supportive needs. Lasandra Beech, LCSW [phone]

## 2015-10-08 NOTE — Progress Notes (Signed)
  LVAD Exit Site:  VAD dressing removed and site care performed using sterile technique. Drive line exit site cleaned with Chlora prep applicators x 2, allowed to dry, and gauze dressing with aquacel strip re-applied. Exit site with moderate tissue ingrowth, two sutures intact. The velour is fully implanted at exit site. Very small amount serous drainage noted, no redness, tenderness, or foul odor noted. Drive line anchor re-applied. Driveline dressing is being changed daily per sterile technique.

## 2015-10-08 NOTE — Progress Notes (Signed)
Patient ID: Brad [name], male   DOB: 07-Oct-1940, 75 y.o.   MRN: [id number]    Advanced Heart Failure Rounding Note HeartMate 2 Rounding Note  Subjective:   Admitted from [facility] with cardiogenic shock for advanced heart failure as INTERMACS-1.  On 12/22 foley placed by Urology requiring urethral dilation -> UTI . ? Septic shock. Placed on vanco/zosyn. IABP placed 12/23. S/p HM 2 LVAD placement 09/14/2015.   Developed fever 09/29/15.Pan cultures sent. Switched from zosyn to imipinem. 1/2 cultures + for yeast. Seen by ID and fluconazole switched to andulafungin on 1/5. Rare GNR and GPC in sputum. Vanc stopped 1/6.  S/p chest tube for L effusion (09/30/15) = exudative.   Difficult night. PIs dropped and he received albumin. Epi and norepi restarted. Milrinone was increased to 0.25 mcg. TMax 101. Chest tube placed this morning with 900cc out. Poor urine output. Creatinine rising.   Overall feeling worse.  Denies SOB  Blood Cx 09/29/15- 1/2 yeast ->speciation still pending -Lab sent to [facility] Urine Cx 09/29/15 - NG UA 09/29/15- with few bacteria  C. Diff 09/29/15 - Negative Resp Culture 09/25/16 - Negative. Resp Culture 09/30/15 -R are GNR/GPC -> STENOTROPHOMONAS MALTOPHILIA    LVAD INTERROGATION:  HeartMate II LVAD:  Flow 10.7 liters/min, speed 9000, power 7.9  , PI  3.3  Low PIs noted over night.    Objective:    Vital Signs:   Temp:  [98.4 F (36.9 C)-101.5 F (38.6 C)] 98.5 F (36.9 C) (01/12 1206) Pulse Rate:  [30-91] 90 (01/12 1200) Resp:  [17-33] 20 (01/12 1200) SpO2:  [90 %-100 %] 97 % (01/12 1200) FiO2 (%):  [55 %] 55 % (01/12 0800) Weight:  [143 lb 8.3 oz (65.1 kg)] 143 lb 8.3 oz (65.1 kg) (01/12 0500) Last BM Date: 10/06/15 Mean arterial Pressure 70-90s.   Intake/Output:   Intake/Output Summary (Last 24 hours) at 10/08/15 1233 Last data filed at 10/08/15 1200  Gross per 24 hour  Intake 3280.57 ml  Output   1750 ml  Net 1530.57 ml     Physical Exam: CVP ~13 General:  NAD, Elderly appearing. Weak. In bed.   HEENT: normal x for panda Neck: supple.  JVP 10 cm.  Carotids 2+ bilat; no bruits. No thyromegaly or nodule noted.  Cor: PMI nondisplaced. RRR. LVAD hum present.  Lungs: Decreased bases bilaterally. Abdomen: soft, NT, mildly distended , no HSM. No bruits or masses. +BS  Extremities: no cyanosis, clubbing, rash. RUE PICC  Rand LLE 2+ edema  Neuro: alert & orientedx3, cranial nerves grossly intact. moves all 4 extremities w/o difficulty. Affect flat GU: Foley  Telemetry: reviewed personally, a sensed v pacing 90s   Labs: Basic Metabolic Panel:  Recent Labs Lab 10/05/15 0528  10/06/15 0510 10/07/15 0502 10/07/15 1712 10/08/15 0335 10/08/15 0530  NA 142  < > 140 142 145 143 143  K 4.0  < > 4.0 4.1 4.3 6.7* 4.8  CL 108  < > 108 108 111 111 111  CO2 24  --  23 22  --  24 23  GLUCOSE 196*  < > 202* 105* 60* 132* 135*  BUN 38*  < > 42* 52* 48* 63* 62*  CREATININE 2.37*  < > 2.70* 3.00* 3.30* 3.75* 3.85*  CALCIUM 9.1  --  8.6* 9.1  --  8.9 9.0  < > = values in this interval not displayed.  Liver Function Tests: No results for input(s): AST, ALT, ALKPHOS, BILITOT, PROT, ALBUMIN in the last  168 hours. No results for input(s): LIPASE, AMYLASE in the last 168 hours. No results for input(s): AMMONIA in the last 168 hours.  CBC:  Recent Labs Lab 10/05/15 0528  10/06/15 0407 10/07/15 0502 10/07/15 1300 10/07/15 1712 10/08/15 0335  WBC 17.5*  --  15.0* 10.9* 10.0  --  10.3  HGB 10.8*  < > 10.0* 8.3* 9.0* 10.2* 8.9*  HCT 31.4*  < > 29.6* 24.4* 26.2* 30.0* 26.7*  MCV 83.3  --  87.6 83.8 84.8  --  87.5  PLT 761*  --  725* 740* 703*  --  718*  < > = values in this interval not displayed.  INR:  Recent Labs Lab 10/04/15 0440 10/05/15 0528 10/06/15 0407 10/07/15 0502 10/08/15 0430  INR 1.73* 2.10* 2.87* 3.34* 2.68*    Other results:    Imaging: Dg Chest Port 1 View  10/08/2015  CLINICAL DATA:  Status post left-sided chest tube  placement EXAM: PORTABLE CHEST 1 VIEW COMPARISON:  Study obtained earlier in the day FINDINGS: There is no a chest tube on the left. There has been a marked diminution of pleural effusion on the left following chest tube placement. On the minimal left-sided pleural effusion remains. No pneumothorax is evident. There is a small right pleural effusion. There is mild bibasilar atelectasis. There is a left ventricular assist device. There are pacemaker leads attached to the right and left heart, stable. Heart is enlarged with pulmonary vascularity within normal limits. No adenopathy evident. Feeding tube tip is below the diaphragm and not seen. Central catheter tip is at the cavoatrial junction, stable. IMPRESSION: Marked diminution of left-sided pleural effusion following chest tube placement on the left. There is a minimal left effusion currently with a small persistent right effusion, stable. No edema or consolidation. Stable cardiac enlargement. Tube. And catheter positions as described. No pneumothorax. Electronically Signed   By: Bretta Bang III M.D.   On: 10/08/2015 09:43   Dg Chest Port 1 View  10/08/2015  CLINICAL DATA:  Shortness of breath, acute and chronic systolic and diastolic heart failure, left ventricular assist device in place, acute renal failure, pump anemia. EXAM: PORTABLE CHEST 1 VIEW COMPARISON:  Portable chest x-ray of October 07, 2015 FINDINGS: There has been further increase in interstitial density in the left upper lobe. Persistent alveolar opacity throughout the mid and lower left lung is present. The cardiac silhouette remains enlarged. There is a small right pleural effusion. The implantable pacemaker defibrillator is in stable position. The left ventricular assist device is also in stable position. The pulmonary vascularity is mildly engorged. The feeding tube tip projects below the inferior margin of the image. The PICC line tip projects over the junction of the SVC with the  right atrium. IMPRESSION: There has been slight interval worsening in the appearance of the mid and upper left lung interstitium consistent with atelectasis or less likely pneumonia. There is persistent left basilar opacity consistent with pleural fluid and atelectasis. A persistent small right pleural effusion is demonstrated. Stable enlargement of the cardiac silhouette with mild central pulmonary vascular congestion. The support apparatus is in stable position. Electronically Signed   By: David  [place] M.D.   On: 10/08/2015 07:52   Dg Chest Port 1 View  10/07/2015  CLINICAL DATA:  Heart failure. EXAM: PORTABLE CHEST 1 VIEW COMPARISON:  10/06/2015. FINDINGS: Orogastric tube and right PICC line stable position. AICD in stable position. Left ventricular assist device again noted. Severe cardiomegaly with bilateral pulmonary interstitial prominence and  bilateral pleural effusions again noted consistent congestive heart failure. Left pleural effusion may be loculated. Low lung volumes with basilar atelectasis . No pneumothorax. IMPRESSION: 1. Lines and tubes in stable position. 2. Prior median sternotomy. AICD and left ventricular assist device in stable position. Severe cardiomegaly with changes of bilateral pulmonary interstitial edema pleural effusions. Left pleural effusion may be loculated. Similar findings noted on prior exam. 3. Low lung volumes with basilar atelectasis. Electronically Signed   By: Maisie Fus  Register   On: 10/07/2015 08:12     Medications:     Scheduled Medications: . amiodarone  200 mg Oral Daily  . anidulafungin  100 mg Intravenous Q24H  . aspirin  81 mg Oral Daily  . ceFEPime (MAXIPIME) IV  1 g Intravenous Q24H  . docusate sodium  200 mg Oral Daily  . feeding supplement  237 mL Oral TID WC  . furosemide  40 mg Intravenous Once  . insulin aspart  0-5 Units Subcutaneous QHS  . insulin aspart  0-9 Units Subcutaneous TID WC  . lactobacillus acidophilus & bulgar  1 tablet Oral  BID  . levofloxacin  500 mg Oral Q48H  . pantoprazole  40 mg Oral Daily  . sildenafil  40 mg Oral TID  . sodium chloride  10 mL Intravenous Q12H  . sodium chloride  10-40 mL Intracatheter Q12H  . [START ON 10/25/2015] vancomycin  1,000 mg Intravenous Q48H  . Warfarin - Physician Dosing Inpatient   Does not apply q1800    Infusions: . sodium chloride Stopped (09/25/15 1100)  . sodium chloride 250 mL (10/07/15 0400)  . epinephrine 4 mcg/min (10/08/15 1200)  . feeding supplement (OSMOLITE 1.5 CAL) 1,000 mL (10/08/15 1200)  . lactated ringers Stopped (09/26/15 0000)  . lactated ringers Stopped (09/24/15 0700)  . milrinone 0.25 mcg/kg/min (10/08/15 1200)  . norepinephrine (LEVOPHED) Adult infusion 8 mcg/min (10/08/15 1200)    PRN Medications: sodium chloride, acetaminophen, Gerhardt's butt cream, hydrALAZINE, ondansetron (ZOFRAN) IV, oxyCODONE, sodium chloride, sodium chloride   Assessment/Plan   1. Acute on chronic systolic HF -> Cardiogenic shock: NICM with EF 5%, mild to moderate RV hypokinesis.  S/p St Jude CRT-D. s/p HM II LVAD placement 09/26/2015. -  Difficulty night. Now epi 4, levo at 8, milrinone at 0.25.  CVP ~13.  Off diuretics for now.  Renal function trending up.   Add ted hose.  2. Atrial fibrillation: Paroxysmal.    - Maintaining sinus  - On coumadin and po amiodarone. INR 2.68   today. 3. 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   - stable. quiescent 4. AKI on CKD: Creatinine 1.9 prior to admission.  - Creatinine continues to rise. Todays creatinine 3.7. Off diuretics.   5. Pulmonary HTN:   - Continue sildenafil 40 tid. 6. Anticoagulation:  - INR 2.86 => 3.46 => 2.56.=> 2.8 => 1.8 => 1.73=>2.1.=>2.8=>3.34=> 2.68  Per Dr Maren Beach.  - On ASA 81 mg daily.   7. ID: Now with fungemia and stenotrophamonas in sputum -  Improved and anidulafungid , vanc, cefepime, and levofloxacin.   -  ID following. WBC coming down. Tmax 101.  Urine culture pending.  -8. Hypernatremia -Todays sodium is  143.  9. HTN -Low todayReceived albumin over night. Maps low. Hold Hydralazine.   10.Deconditioning - Rehab slowed down due to infection. Recommend CIR. CIR following. PT/OT on hold.   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 Urology consulted before LVAD with foley placed. Foley was removed 10/05/15. Urine culture sent. Urology placed foley 1/11.  12. Anemia- Received 1 UPRBCs on 10/07/15. Todays Hgb 8.9.  13. L Pleural Effusion- S/P chest tube today.     I reviewed the LVAD parameters from today, and compared the results to the patient's prior recorded data.  No programming changes were made.  The LVAD is functioning within specified parameters. LVAD interrogation was negative for any significant power changes, alarms or PI events/speed drops.  LVAD equipment check completed and is in good working order.  Back-up equipment present.   Length of Stay: 22  Amy Clegg NP-C   10/08/2015, 12:33 PM  VAD Team --- VAD ISSUES ONLY--- Pager [phone] (7am - 7am)   Patient seen and examined with Tonye Becket, NP. We discussed all aspects of the encounter. I agree with the assessment and plan as stated above.   Much worse over the past 24 hours likely due to recurrent sepsis. Was febrile again. BP down. Now on Epi 4 and levophed 8. Urine output poor with CVP 13-15. Underwent placement of left chest tube today for large recurrent pleural effusion. Now on vancomycin, zosyn and andulafungin. PI low on VAD. Remains weak and malnourished. May need CVVHD.  Prognosis is guarded. VAD parameters reviewed personally.  The patient is critically ill with multiple organ systems failure and requires high complexity decision making for assessment and support, frequent evaluation and titration of therapies, application of advanced monitoring technologies and extensive interpretation of multiple databases.   Critical Care Time devoted to patient care services described in this note is 45 Minutes.  Bensimhon,  Daniel,MD 4:05 PM

## 2015-10-08 NOTE — Progress Notes (Signed)
LVAD Coordinator Advanced Heart Failure Rounds:  HM II LVAD Implanted 09/19/2015 as DT by Dr. Donata Clay.   Awake in the bed today. Respiratory status much improved after left CT placement by Dr. Donata Clay with 900cc serosanguinous fluid removed.   Vital signs: Temp: 98.4 - 101.5 HR: 90s A sensed, V paced  ICD SJM CRT-D (defib therapy off, pacing on) Doppler BP: 60 - 84 O2 Sat: 93 - 98% Venturi mask 15 L/min ---> 3.5 L/[place] (after CT placement)  Wt:142.6 lbs (preop) > 161...Marland KitchenMarland Kitchen 142 > 139 > 135 > 134 > 136 > 134 > 130 > 130 > 130 > no wt recorded > 143   LVAD interrogation reveals:  Speed: 9000 Flow: 9.4 Power: 7.4 PI: 3.9  Alarms: none Events: 10 PI daily Fixed speed: 9000 Low speed limit: 8400  Back up controller programmed accordingly and is at the bedside. LVAD is functioning within expected parameters. No programming changes were made this morning. Some power elevations noted on interrogation a/w PI events. Highest wattage 9.3 - 10.0 w on 10/05/15 noted.  No sustained power elevations.  Labs:  LDH trend: 372 >... > 392 > 408 > 450 > 401 > 522 > 383  INR trend: 1.21 > ...  > 1.69 > 1.73 > 2.10 > 3.34 > 2.68  Hgb: 11.6 > 10.4 > 10.1 > 10.6 > 11.0 > 10.6 > 10.1 > 10.5 > 8.3 > 9.0  > 8.9  WBC:  8.9 > ... > 16.4 > 14.7 > 18.4 > 17.5 > 10.9 > 10.3  Co-Ox: 83% Creatinine: 1.6 (preop) > ... > 3.20 > ... > 1.90 > 2.32 > 2.00 > 2.13 > 2.37 > 3.0 > 3.3 > 3.75 > 3.85  Antithrombotic Management: 09/24/15--> 325 mg ASA   81 mg now 09/24/15--> Warfarin started  Infusions: Milrinone 0.25 Epi off 09/27/15; re-started 10/08/15 at 4 mcg Levo off 09/27/15; re-started 10/08/15 at 4 mcg  Dopamine off 10/08/15  OR Blood Products: 4 u FFP 2 u Platelets Cell saver  ICU Blood Products:  09/17/2015--> 2 FFP, 2 pRBC (Hgb 7) 09/24/15 - 2 units pRBC 10/01/15 --> 2 u PRBC 10/07/15 --> 1 uPRBC and 1 FFP  Ventilator: Extubated 09/25/15  NO weaned off. On Sildenafil 40 mg TID for  PAH   Infection: 1/2 BCs + for candida 09/29/2015  anidulafungin (ERAXIS) 100 mg QD 10/01/15 >> current  imipenem-cilastatin (Primaxin) 250 mg Q8H 09/29/15 - 10/03/15  Sputum >> stenotrophamonas on 09/30/2015  Levofloxacin 10/06/15 >> current  Septra 10/04/15 >> 10/06/15  Levoquin 500 mg q 48 hrs 10/06/15 >> current Zosyn 2.25 mg q 6 hrs 10/07/15 -10/08/15 Vancomycin 1000 mg q 48 hrs 10/07/15 >> current Maxipime 1 gm q 24 hrs 10/08/15 >> current  Respiratory Events: CT placed for effusion. CT placed for effusion 10/08/15  10/12/15 --> plan for pleurex catheter drain with recurrent effusions (PVT)   Plan as discussed with team: 1. MAPs and PI's down - did not respond to albumin alone; re-started Epi and Levo, dopamine weaned off.   2.  Respiratory status requiring Venturi mask 15 L/[place] improved after left CT placement with 900 cc serosanguinous fluid.  O2 3.5 L/ with sats 98%.   3. Some power elevations noted on interrogation as above. Continue to monitor. No turbulence to pump with auscultation. + hematuria however d/t BPH and recent foley removal. LDHs had been trending up, lower today.   4. With PI's dropping <4 occasionally would recommend to doppler BP first  then follow up with automatic BP cuff. When pulsatility is low the automatic BP cuff is not the most reliable method of measurement.   5. D/C teaching will be scheduled when patient is more stable. Planning disposition at D/C to CIR.  Hessie Diener, BSN, RN,  VAD Coordinator  Office: [phone] 24/7 VAD Pager: [phone]

## 2015-10-08 NOTE — Progress Notes (Signed)
ANTIBIOTIC CONSULT NOTE - Follow Up Consult  Pharmacy Consult for Vancomycin and Cefepime Indication: fever  No Known Allergies  Patient Measurements: Height: 5\' 7"  (170.2 cm) Weight: 143 lb 8.3 oz (65.1 kg) IBW/kg (Calculated) : 66.1  Vital Signs: Temp: 98.9 F (37.2 C) (01/12 0935) Temp Source: Axillary (01/12 0935) Pulse Rate: 90 (01/12 1000) Intake/Output from previous day: 01/11 0701 - 01/12 0700 In: 2927.7 [I.V.:379.7; Blood:698; NG/GT:660; IV Piggyback:1180] Out: 700 [Urine:700] Intake/Output from this shift: Total I/O In: 260.5 [I.V.:100.5; NG/GT:30; IV Piggyback:130] Out: 1030 [Urine:80; Chest Tube:950]  Labs:  Recent Labs  10/07/15 0502 10/07/15 1300 10/07/15 1712 10/08/15 0335 10/08/15 0530  WBC 10.9* 10.0  --  10.3  --   HGB 8.3* 9.0* 10.2* 8.9*  --   PLT 740* 703*  --  718*  --   CREATININE 3.00*  --  3.30* 3.75* 3.85*   Estimated Creatinine Clearance: 15.5 mL/min (by C-G formula based on Cr of 3.85).  Assessment: 74yom with LVAD on day 8 anidulafungin and day 3 levaquin for candida bacteremia and stenotrophomonas pneumonia with continued fevers. Dr. Maren Beach gave him one dose of vancomycin last night and also started zosyn. Today, ID recommended to change zosyn to cefepime and continue vancomycin. He is in acute renal failure with sCr trending up to 3.85, CrCl 83ml/min. He may need CRRT eventually.  Goal of Therapy:  Vancomycin trough level 15-20 mcg/ml  Plan:  1) Vancomycin 1g IV q48 2) Cefepime 1g IV q24  Fredrik Rigger 10/08/2015,11:36 AM

## 2015-10-08 NOTE — Progress Notes (Signed)
Dr. Laneta Simmers called and notified of VAD numbers high flows/power and low PI, also MAPs in 70s. Orders received for Albumin.

## 2015-10-08 NOTE — Progress Notes (Signed)
PT Cancellation Note  Patient Details Name: Brad [name] MRN: [id number] DOB: 02/18/1941   Cancelled Treatment:    Reason Eval/Treat Not Completed: Medical issues which prohibited therapy.  New chest tube, on pressure, declining renal function.  Will see 1/13 as able. 10/08/2015  Shannon Bing, PT [phone]  (pager)   Derold Dorsch, [name] 10/08/2015, 4:07 PM

## 2015-10-08 NOTE — Progress Notes (Signed)
CSW met at bedside with patient and wife. Patient states "very good" when asked how he is feeling. Patient still in ICU with multiple ongoing issues. Patient wife very supportive and continues to drive 3hrs each way to visit with patient. Patient hopeful for improved strength and health and positive about outlook for recovery. CSW will continue to follow for support throughout recovery. Raquel Sarna, [place]

## 2015-10-08 NOTE — Progress Notes (Signed)
CT surgery procedure note  Left chest tube placed for recurrent large left pleural effusion and respiratory failure. Informed consent obtained. Proper timeout performed. Procedure done under local anesthesia with sterile prep and drape.  28 French chest tube placed posteriorly into the left pleural space with drainage of 1 L of serosanguineous fluid. Chest tube connected to a Pleur-evac drainage system.  Sample of pleural fluid sent for Gram stain and culture.  Patient tolerated the procedure well. Post procedure chest x-ray shows resolution of left pleural effusion, chest tube in good position.

## 2015-10-08 NOTE — Progress Notes (Signed)
Patient ID: Brad [name], male   DOB: 1941-02-03, 75 y.o.   MRN: [id number]         [facility]    Date of Admission:  09/20/2015   Total days of antibiotics 22        Day 8 anidulafungin        Day 3 levofloxacin        Day 1 vancomycin        Day 1 piperacillin tazobactam         Principal Problem:   Fungemia Active Problems:   Acute on chronic systolic and diastolic heart failure, NYHA class 4 (HCC)   LVAD (left ventricular assist device) present (HCC)   Automatic implantable cardioverter-defibrillator in situ   Cardiogenic shock (HCC)   Acute renal failure superimposed on stage 3 chronic kidney disease (HCC)   PAF (paroxysmal atrial fibrillation) (HCC)   PAH (pulmonary artery hypertension) (HCC)   Dyspnea   Hypotension   Palliative care encounter   CHF (congestive heart failure) (HCC)   AKI (acute kidney injury) (HCC)   HCAP (healthcare-associated pneumonia)   . amiodarone  200 mg Oral Daily  . anidulafungin  100 mg Intravenous Q24H  . aspirin  81 mg Oral Daily  . docusate sodium  200 mg Oral Daily  . feeding supplement  237 mL Oral TID WC  . feeding supplement (OSMOLITE 1.5 CAL)  1,000 mL Per Tube Q24H  . furosemide  40 mg Intravenous Once  . insulin aspart  0-5 Units Subcutaneous QHS  . insulin aspart  0-9 Units Subcutaneous TID WC  . lactobacillus acidophilus & bulgar  1 tablet Oral BID  . levofloxacin  500 mg Oral Q48H  . pantoprazole  40 mg Oral Daily  . piperacillin-tazobactam (ZOSYN)  IV  2.25 g Intravenous 4 times per day  . sildenafil  40 mg Oral TID  . sodium chloride  10 mL Intravenous Q12H  . sodium chloride  10-40 mL Intracatheter Q12H  . Warfarin - Physician Dosing Inpatient   Does not apply q1800    SUBJECTIVE: He is having some discomfort from his newly placed left pleural catheter.  Review of Systems: Review of Systems  Constitutional: Positive for fever. Negative for chills and diaphoresis.  Respiratory:  Positive for cough and sputum production. Negative for shortness of breath.   Cardiovascular: Positive for chest pain.  Gastrointestinal: Negative for diarrhea.    Past Medical History  Diagnosis Date  . 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 CKD (chronic kidney disease) stage 3, GFR 30-59 ml/min   . V-tach (HCC)   . PAF (paroxysmal atrial fibrillation) (HCC)   . CVA (cerebral infarction)   . BPH (benign prostatic hyperplasia)   . HTN (hypertension), benign   . Hyperlipidemia     Social History  Substance Use Topics  . Smoking status: Never Smoker   . Smokeless tobacco: None  . Alcohol Use: No    Family History  Problem Relation Age of Onset  . Hypertension Mother   . CVA Maternal Aunt    No Known Allergies  OBJECTIVE: Filed Vitals:   10/08/15 0800 10/08/15 0900 10/08/15 0935 10/08/15 1000  BP:      Pulse: 90 90  90  Temp:   98.9 F (37.2 C)   TempSrc:   Axillary   Resp: 18   22  Height:      Weight:  SpO2: 93% 96%  98%   Body mass index is 22.47 kg/(m^2).  Physical Exam  Constitutional: No distress.  He is weak but in no distress.  Cardiovascular: Normal rate and regular rhythm.   No murmur heard. Pulmonary/Chest:  AICD and LVAD sites appear normal. Diminished breath sounds on left. He is wearing mask O2.  Abdominal: Soft. There is no tenderness.  Genitourinary:  Blood-tinged urine in Foley bag.  Skin:  Central line site looks okay.    Lab Results Lab Results  Component Value Date   WBC 10.3 10/08/2015   HGB 8.9* 10/08/2015   HCT 26.7* 10/08/2015   MCV 87.5 10/08/2015   PLT 718* 10/08/2015    Lab Results  Component Value Date   CREATININE 3.85* 10/08/2015   BUN 62* 10/08/2015   NA 143 10/08/2015   K 4.8 10/08/2015   CL 111 10/08/2015   CO2 23 10/08/2015    Lab Results  Component Value Date   ALT 26 10/01/2015   AST 91* 10/01/2015   ALKPHOS 117 10/01/2015   BILITOT 1.1 10/01/2015      Microbiology: Recent Results (from the past 240 hour(s))  Culture, blood (single)     Status: None   Collection Time: 09/29/15  8:30 AM  Result Value Ref Range Status   Specimen Description BLOOD PICC LINE  Final   Special Requests BOTTLES DRAWN AEROBIC AND ANAEROBIC 10CC  Final   Culture NO GROWTH 5 DAYS  Final   Report Status 10/04/2015 FINAL  Final  Urine culture     Status: None   Collection Time: 09/29/15 10:00 AM  Result Value Ref Range Status   Specimen Description URINE, CATHETERIZED  Final   Special Requests NONE  Final   Culture NO GROWTH 1 DAY  Final   Report Status 09/30/2015 FINAL  Final  Culture, blood (single)     Status: None (Preliminary result)   Collection Time: 09/29/15 11:45 AM  Result Value Ref Range Status   Specimen Description BLOOD LEFT HAND  Final   Special Requests IN PEDIATRIC BOTTLE 3CC  Final   Culture  Setup Time   Final    YEAST IN PEDIATRIC BOTTLE CRITICAL RESULT CALLED TO, READ BACK BY AND VERIFIED WITH: N SARHAM @0729  10/01/15 MKELLY    Culture   Final    CANDIDA SPECIES, NOT ALBICANS TO [address] FOR SENSITIVITY    Report Status PENDING  Incomplete  C difficile quick scan w PCR reflex     Status: None   Collection Time: 09/29/15  1:37 PM  Result Value Ref Range Status   C Diff antigen NEGATIVE NEGATIVE Final   C Diff toxin NEGATIVE NEGATIVE Final   C Diff interpretation Negative for toxigenic C. difficile  Final  Culture, respiratory (NON-Expectorated)     Status: None   Collection Time: 09/30/15  9:27 AM  Result Value Ref Range Status   Specimen Description INDUCED SPUTUM  Final   Special Requests Normal  Final   Gram Stain   Final    MODERATE WBC PRESENT, PREDOMINANTLY PMN MODERATE SQUAMOUS EPITHELIAL CELLS PRESENT RARE GRAM POSITIVE COCCI IN PAIRS RARE YEAST RARE GRAM NEGATIVE RODS    Culture   Final    FEW STENOTROPHOMONAS MALTOPHILIA Performed at [facility]    Report Status 10/03/2015 FINAL  Final    Organism ID, Bacteria STENOTROPHOMONAS MALTOPHILIA  Final      Susceptibility   Stenotrophomonas maltophilia - MIC*    TRIMETH/SULFA Value in next  row Sensitive      <=20 SENSITIVE(NOTE)    LEVOFLOXACIN Value in next row Sensitive      <=20 SENSITIVE(NOTE)    * FEW STENOTROPHOMONAS MALTOPHILIA  Culture, body fluid-bottle     Status: None   Collection Time: 09/30/15 10:21 AM  Result Value Ref Range Status   Specimen Description FLUID PLEURAL LEFT  Final   Special Requests NONE  Final   Culture NO GROWTH 5 DAYS  Final   Report Status 10/05/2015 FINAL  Final  Gram stain     Status: None   Collection Time: 09/30/15 10:21 AM  Result Value Ref Range Status   Specimen Description FLUID PLEURAL LEFT  Final   Special Requests NONE  Final   Gram Stain   Final    MODERATE WBC PRESENT,BOTH PMN AND MONONUCLEAR NO ORGANISMS SEEN    Report Status 09/30/2015 FINAL  Final  Culture, blood (routine x 2)     Status: None   Collection Time: 10/01/15  9:55 AM  Result Value Ref Range Status   Specimen Description BLOOD LEFT WRIST  Final   Special Requests BOTTLES DRAWN AEROBIC AND ANAEROBIC 5CC  Final   Culture NO GROWTH 5 DAYS  Final   Report Status 10/06/2015 FINAL  Final  Culture, blood (routine x 2)     Status: None   Collection Time: 10/01/15 10:03 AM  Result Value Ref Range Status   Specimen Description BLOOD LEFT HAND  Final   Special Requests IN PEDIATRIC BOTTLE 3CC  Final   Culture NO GROWTH 5 DAYS  Final   Report Status 10/06/2015 FINAL  Final  Culture, blood (single)     Status: None (Preliminary result)   Collection Time: 10/05/15 10:32 AM  Result Value Ref Range Status   Specimen Description BLOOD CENTRAL LINE  Final   Special Requests BOTTLES DRAWN AEROBIC AND ANAEROBIC 5CC  Final   Culture NO GROWTH 2 DAYS  Final   Report Status PENDING  Incomplete  Culture, Urine     Status: None   Collection Time: 10/06/15  4:30 PM  Result Value Ref Range Status   Specimen Description  URINE, CLEAN CATCH  Final   Special Requests Levaquin, anidulafungin  Final   Culture NO GROWTH 1 DAY  Final   Report Status 10/07/2015 FINAL  Final     ASSESSMENT: I will continue anidulafungin and levofloxacin for now. I will obtain repeat sputum, blood, urine and pleural fluid cultures. Last evening his antiemetic therapy was broadened because of his fevers. I will continue vancomycin. I will change piperacillin tazobactam to cefepime.  PLAN: 1. Continue broad antibiotic therapy pending culture results.   [name], MD [facility] [phone] pager   [phone] cell 10/08/2015, 10:44 AM

## 2015-10-08 NOTE — Progress Notes (Addendum)
Dr. Laneta Simmers paged for low PIs and low MAPs despite giving 2 albumins, new orders received to increase Milrinone back up to .25, start Epi at 4, give 1 more Albumin, check ABG, and re-collect Co-ox.   5945 Dr. Donata Clay called updated on pt's last 12 hours, current numbers, and new orders just received from Dr Laneta Simmers, aditional order to start Levophed at 5-10 received and d/c Dopamine once Levo started.   Will implement and continue to monitor.

## 2015-10-09 ENCOUNTER — Encounter (HOSPITAL_COMMUNITY)
Admission: AD | Disposition: E | Payer: Self-pay | Source: Other Acute Inpatient Hospital | Attending: Cardiothoracic Surgery

## 2015-10-09 ENCOUNTER — Inpatient Hospital Stay (HOSPITAL_COMMUNITY): Payer: Medicare Other

## 2015-10-09 ENCOUNTER — Encounter (HOSPITAL_COMMUNITY): Payer: Self-pay | Admitting: Pathology

## 2015-10-09 DIAGNOSIS — J158 Pneumonia due to other specified bacteria: Secondary | ICD-10-CM

## 2015-10-09 DIAGNOSIS — R197 Diarrhea, unspecified: Secondary | ICD-10-CM

## 2015-10-09 DIAGNOSIS — Z95811 Presence of heart assist device: Secondary | ICD-10-CM

## 2015-10-09 DIAGNOSIS — R509 Fever, unspecified: Secondary | ICD-10-CM

## 2015-10-09 DIAGNOSIS — I5043 Acute on chronic combined systolic (congestive) and diastolic (congestive) heart failure: Secondary | ICD-10-CM

## 2015-10-09 DIAGNOSIS — N178 Other acute kidney failure: Secondary | ICD-10-CM

## 2015-10-09 LAB — PREPARE FRESH FROZEN PLASMA: Unit division: 0

## 2015-10-09 LAB — RENAL FUNCTION PANEL
Albumin: 3.6 g/dL (ref 3.5–5.0)
Anion gap: 14 (ref 5–15)
BUN: 74 mg/dL — ABNORMAL HIGH (ref 6–20)
CO2: 21 mmol/L — ABNORMAL LOW (ref 22–32)
Calcium: 9.3 mg/dL (ref 8.9–10.3)
Chloride: 106 mmol/L (ref 101–111)
Creatinine, Ser: 3.78 mg/dL — ABNORMAL HIGH (ref 0.61–1.24)
GFR calc Af Amer: 17 mL/min — ABNORMAL LOW (ref >60)
GFR calc non Af Amer: 14 mL/min — ABNORMAL LOW (ref >60)
Glucose, Bld: 142 mg/dL — ABNORMAL HIGH (ref 65–99)
Phosphorus: 5.8 mg/dL — ABNORMAL HIGH (ref 2.5–4.6)
Potassium: 4.7 mmol/L (ref 3.5–5.1)
Sodium: 141 mmol/L (ref 135–145)

## 2015-10-09 LAB — CBC
HCT: 24 % — ABNORMAL LOW (ref 39.0–52.0)
HCT: 29.7 % — ABNORMAL LOW (ref 39.0–52.0)
Hemoglobin: 8.1 g/dL — ABNORMAL LOW (ref 13.0–17.0)
Hemoglobin: 10.5 g/dL — ABNORMAL LOW (ref 13.0–17.0)
MCH: 29.2 pg (ref 26.0–34.0)
MCH: 29.5 pg (ref 26.0–34.0)
MCHC: 33.8 g/dL (ref 30.0–36.0)
MCHC: 35.4 g/dL (ref 30.0–36.0)
MCV: 86.6 fL (ref 78.0–100.0)
MCV: 83.4 fL (ref 78.0–100.0)
Platelets: 841 10*3/uL — ABNORMAL HIGH (ref 150–400)
Platelets: 933 10*3/uL (ref 150–400)
RBC: 2.77 MIL/uL — ABNORMAL LOW (ref 4.22–5.81)
RBC: 3.56 MIL/uL — ABNORMAL LOW (ref 4.22–5.81)
RDW: 17 % — ABNORMAL HIGH (ref 11.5–15.5)
RDW: 16.5 % — ABNORMAL HIGH (ref 11.5–15.5)
WBC: 11.5 10*3/uL — ABNORMAL HIGH (ref 4.0–10.5)
WBC: 13 10*3/uL — ABNORMAL HIGH (ref 4.0–10.5)

## 2015-10-09 LAB — BASIC METABOLIC PANEL
Anion gap: 11 (ref 5–15)
BUN: 72 mg/dL — ABNORMAL HIGH (ref 6–20)
CO2: 22 mmol/L (ref 22–32)
Calcium: 9.1 mg/dL (ref 8.9–10.3)
Chloride: 109 mmol/L (ref 101–111)
Creatinine, Ser: 4.22 mg/dL — ABNORMAL HIGH (ref 0.61–1.24)
GFR calc Af Amer: 15 mL/min — ABNORMAL LOW (ref >60)
GFR calc non Af Amer: 13 mL/min — ABNORMAL LOW (ref >60)
Glucose, Bld: 117 mg/dL — ABNORMAL HIGH (ref 65–99)
Potassium: 4.5 mmol/L (ref 3.5–5.1)
Sodium: 142 mmol/L (ref 135–145)

## 2015-10-09 LAB — URINE CULTURE: Culture: NO GROWTH

## 2015-10-09 LAB — HEPATIC FUNCTION PANEL
ALT: 23 U/L (ref 17–63)
AST: 52 U/L — ABNORMAL HIGH (ref 15–41)
Albumin: 3.3 g/dL — ABNORMAL LOW (ref 3.5–5.0)
Alkaline Phosphatase: 122 U/L (ref 38–126)
Bilirubin, Direct: 0.2 mg/dL (ref 0.1–0.5)
Indirect Bilirubin: 0.3 mg/dL (ref 0.3–0.9)
Total Bilirubin: 0.5 mg/dL (ref 0.3–1.2)
Total Protein: 6 g/dL — ABNORMAL LOW (ref 6.5–8.1)

## 2015-10-09 LAB — IRON AND TIBC
Iron: 23 ug/dL — ABNORMAL LOW (ref 45–182)
SATURATION RATIOS: 13 % — AB (ref 17.9–39.5)
TIBC: 178 ug/dL — ABNORMAL LOW (ref 250–450)
UIBC: 155 ug/dL

## 2015-10-09 LAB — GLUCOSE, CAPILLARY
GLUCOSE-CAPILLARY: 119 mg/dL — AB (ref 65–99)
Glucose-Capillary: 124 mg/dL — ABNORMAL HIGH (ref 65–99)
Glucose-Capillary: 145 mg/dL — ABNORMAL HIGH (ref 65–99)
Glucose-Capillary: 158 mg/dL — ABNORMAL HIGH (ref 65–99)

## 2015-10-09 LAB — PROCALCITONIN: Procalcitonin: 4.02 ng/mL

## 2015-10-09 LAB — LACTATE DEHYDROGENASE: LDH: 371 U/L — ABNORMAL HIGH (ref 98–192)

## 2015-10-09 LAB — OCCULT BLOOD X 1 CARD TO LAB, STOOL: Fecal Occult Bld: NEGATIVE

## 2015-10-09 LAB — CARBOXYHEMOGLOBIN
Carboxyhemoglobin: 1.9 % — ABNORMAL HIGH (ref 0.5–1.5)
Methemoglobin: 0.9 % (ref 0.0–1.5)
O2 Saturation: 84.5 %
Total hemoglobin: 7.7 g/dL — ABNORMAL LOW (ref 13.5–18.0)

## 2015-10-09 LAB — PROTIME-INR
INR: 2.91 — ABNORMAL HIGH (ref 0.00–1.49)
Prothrombin Time: 29.9 seconds — ABNORMAL HIGH (ref 11.6–15.2)

## 2015-10-09 LAB — FERRITIN: Ferritin: 1895 ng/mL — ABNORMAL HIGH (ref 24–336)

## 2015-10-09 LAB — PREPARE RBC (CROSSMATCH)

## 2015-10-09 SURGERY — INSERTION, PLEURAL DRAINAGE CATHETER
Anesthesia: Monitor Anesthesia Care | Site: Chest | Laterality: Left

## 2015-10-09 MED ORDER — DEXTROSE 5 % IV SOLN
2.0000 g | Freq: Two times a day (BID) | INTRAVENOUS | Status: DC
  Administered 2015-10-09 – 2015-10-15 (×13): 2 g via INTRAVENOUS
  Filled 2015-10-09 (×14): qty 2

## 2015-10-09 MED ORDER — VANCOMYCIN HCL IN DEXTROSE 750-5 MG/150ML-% IV SOLN
750.0000 mg | INTRAVENOUS | Status: DC
  Administered 2015-10-09 – 2015-10-11 (×3): 750 mg via INTRAVENOUS
  Filled 2015-10-09 (×4): qty 150

## 2015-10-09 MED ORDER — HEPARIN SODIUM (PORCINE) 1000 UNIT/ML DIALYSIS
1000.0000 [IU] | INTRAMUSCULAR | Status: DC | PRN
  Filled 2015-10-09: qty 6

## 2015-10-09 MED ORDER — LEVOFLOXACIN 250 MG PO TABS
250.0000 mg | ORAL_TABLET | Freq: Every day | ORAL | Status: DC
  Administered 2015-10-09: 250 mg via ORAL
  Filled 2015-10-09 (×2): qty 1

## 2015-10-09 MED ORDER — ALBUMIN HUMAN 25 % IV SOLN
12.5000 g | Freq: Three times a day (TID) | INTRAVENOUS | Status: DC | PRN
  Administered 2015-10-09 – 2015-10-15 (×4): 12.5 g via INTRAVENOUS
  Filled 2015-10-09 (×5): qty 50

## 2015-10-09 MED ORDER — PRISMASOL BGK 4/2.5 32-4-2.5 MEQ/L IV SOLN
INTRAVENOUS | Status: DC
  Administered 2015-10-09 – 2015-10-10 (×6): via INTRAVENOUS_CENTRAL
  Filled 2015-10-09 (×6): qty 5000

## 2015-10-09 MED ORDER — CETYLPYRIDINIUM CHLORIDE 0.05 % MT LIQD
7.0000 mL | Freq: Two times a day (BID) | OROMUCOSAL | Status: DC
  Administered 2015-10-09 – 2015-10-10 (×2): 7 mL via OROMUCOSAL

## 2015-10-09 MED ORDER — PRISMASOL BGK 4/2.5 32-4-2.5 MEQ/L IV SOLN
INTRAVENOUS | Status: DC
  Administered 2015-10-09 – 2015-10-11 (×3): via INTRAVENOUS_CENTRAL
  Filled 2015-10-09 (×4): qty 5000

## 2015-10-09 MED ORDER — PRISMASOL BGK 4/2.5 32-4-2.5 MEQ/L IV SOLN
INTRAVENOUS | Status: DC
  Administered 2015-10-09 – 2015-10-10 (×7): via INTRAVENOUS_CENTRAL
  Filled 2015-10-09 (×16): qty 5000

## 2015-10-09 NOTE — Progress Notes (Addendum)
LVAD Exit Site:     VAD dressing removed and site care performed using sterile technique. Drive line exit site cleaned with Chlora prep applicators x 2, allowed to dry, and gauze dressing with aquacel strip re-applied. Exit site with moderate tissue ingrowth, two sutures intact. The velour is fully implanted at exit site. Increased drainage since yesterday--mucoid tan-white in appearance with some dark brown/tan drainage collecting on strip and gauze; no foul odor, tenderness, erythema or induration. On broad-spectrum antibiotics but if continued drainage will DW Dr. Donata Clay about culturing. I have updated Dr. Donata Clay of my findings.   Drive line anchor seated well. Driveline dressing is being changed daily per sterile technique.  Rexene Alberts, RN VAD Coordinator   Office: [phone] 24/7 VAD Pager: [phone]

## 2015-10-09 NOTE — Progress Notes (Signed)
Patient ID: Brad [name], male   DOB: August 18, 1941, 75 y.o.   MRN: [id number]    Advanced Heart Failure Rounding Note HeartMate 2 Rounding Note  Subjective:   Admitted from [facility] with cardiogenic shock for advanced heart failure as INTERMACS-1.  On 12/22 foley placed by Urology requiring urethral dilation -> UTI . ? Septic shock. Placed on vanco/zosyn. IABP placed 12/23. S/p HM 2 LVAD placement 09/26/2015.   Developed fever 09/29/15.Pan cultures sent. Switched from zosyn to imipinem. 1/2 cultures + for yeast. Seen by ID and fluconazole switched to andulafungin on 1/5. Rare GNR and GPC in sputum. Vanc stopped 1/6.  S/p chest tube for L effusion (09/30/15) = exudative.   Remains on Epi, norepi 5 mcg,  Milrinone  0.25 mcg. TMax 102.8 . Pan cultures obtained.  Chest tube drainage slowing. Poor urine output. Creatinine rising-->4.2 . Hgb down to 8.1. Receiving 1UPRBCs.   Denies SOB. Hungry.   Blood Cx 09/29/15- 1/2 yeast ->speciation still pending -Lab sent to [facility] Urine Cx 09/29/15 - NG UA 09/29/15- with few bacteria  C. Diff 09/29/15 - Negative Resp Culture 09/25/16 - Negative. Resp Culture 09/30/15 -R are GNR/GPC -> STENOTROPHOMONAS MALTOPHILIA    LVAD INTERROGATION:  HeartMate II LVAD:  Flow 7.9 liters/min, speed 9000, power 7  , PI  5.9 Rare PI events. .    Objective:    Vital Signs:   Temp:  [98.3 F (36.8 C)-102.8 F (39.3 C)] 98.4 F (36.9 C) (01/13 0846) Pulse Rate:  [90-92] 90 (01/13 0900) Resp:  [16-33] 25 (01/13 0900) SpO2:  [91 %-98 %] 96 % (01/13 0900) Weight:  [141 lb 4.8 oz (64.093 kg)] 141 lb 4.8 oz (64.093 kg) (01/13 0630) Last BM Date: 10/06/15 Mean arterial Pressure 80-114   Intake/Output:   Intake/Output Summary (Last 24 hours) at 10/20/2015 0909 Last data filed at 10/01/2015 0844  Gross per 24 hour  Intake 3374.4 ml  Output   1055 ml  Net 2319.4 ml     Physical Exam: CVP ~14-15 General: NAD, Elderly appearing. Weak. In the chair.   HEENT: normal x for  panda Neck: supple.  JVP to jaw.  Carotids 2+ bilat; no bruits. No thyromegaly or nodule noted.  Cor: PMI nondisplaced. RRR. LVAD hum present.  Lungs: Decreased bases bilaterally. Abdomen: soft, NT, mildly distended , no HSM. No bruits or masses. +BS  Extremities: no cyanosis, clubbing, rash. RUE PICC  Rand LLE 2+ edema  Neuro: alert & orientedx3, cranial nerves grossly intact. moves all 4 extremities w/o difficulty. Affect flat GU: Foley  Telemetry: reviewed personally, a sensed v pacing 90s   Labs: Basic Metabolic Panel:  Recent Labs Lab 10/06/15 0510 10/07/15 0502 10/07/15 1712 10/08/15 0335 10/08/15 0530 10/08/15 1248 10/22/2015 0435  NA 140 142 145 143 143 142 142  K 4.0 4.1 4.3 6.7* 4.8 4.2 4.5  CL 108 108 111 111 111 108 109  CO2 23 22  --  24 23  --  22  GLUCOSE 202* 105* 60* 132* 135* 241* 117*  BUN 42* 52* 48* 63* 62* 67* 72*  CREATININE 2.70* 3.00* 3.30* 3.75* 3.85* 3.70* 4.22*  CALCIUM 8.6* 9.1  --  8.9 9.0  --  9.1    Liver Function Tests:  Recent Labs Lab 10/02/2015 0435  AST 52*  ALT 23  ALKPHOS 122  BILITOT 0.5  PROT 6.0*  ALBUMIN 3.3*   No results for input(s): LIPASE, AMYLASE in the last 168 hours. No results for input(s): AMMONIA in  the last 168 hours.  CBC:  Recent Labs Lab 10/06/15 0407 10/07/15 0502 10/07/15 1300 10/07/15 1712 10/08/15 0335 10/08/15 1248 09/30/2015 0435  WBC 15.0* 10.9* 10.0  --  10.3  --  11.5*  HGB 10.0* 8.3* 9.0* 10.2* 8.9* 9.5* 8.1*  HCT 29.6* 24.4* 26.2* 30.0* 26.7* 28.0* 24.0*  MCV 87.6 83.8 84.8  --  87.5  --  86.6  PLT 725* 740* 703*  --  718*  --  841*    INR:  Recent Labs Lab 10/05/15 0528 10/06/15 0407 10/07/15 0502 10/08/15 0430 10/05/2015 0435  INR 2.10* 2.87* 3.34* 2.68* 2.91*    Other results:    Imaging: Dg Chest Port 1 View  09/30/2015  CLINICAL DATA:  INSERTION OF IMPLANTABLE LEFT VENTRICULAR ASSIST DEVICE (N/A Chest) TRANSESOPHAGEAL ECHOCARDIOGRAM (TEE) (N/A ) [id number] CPT (R) EXAM:  PORTABLE CHEST 1 VIEW COMPARISON:  10/08/2015 FINDINGS: Cardiac silhouette remains enlarged. There is no mediastinal widening. Visible portion of the ventricular assist device is stable projecting over the left ventricle. There is persistent opacity in the lung bases and left perihilar region consistent with a combination of pleural effusions and atelectasis. No overt pulmonary edema. Right subclavian central venous line, left-sided chest tube, enteric feeding tube and biventricular cardioverter-defibrillator are all stable in well positioned. No pneumothorax. IMPRESSION: 1. Findings are similar to the previous day's study allowing for differences in technique. 2. Persistent lower lung zone and left perihilar opacity consistent with a combination of pleural effusions and atelectasis. No overt pulmonary edema. 3. No pneumothorax. 4. Support apparatus is stable and well positioned. Electronically Signed   By: Amie Portland M.D.   On: 10/26/2015 08:14   Dg Chest Port 1 View  10/08/2015  CLINICAL DATA:  Status post left-sided chest tube placement EXAM: PORTABLE CHEST 1 VIEW COMPARISON:  Study obtained earlier in the day FINDINGS: There is no a chest tube on the left. There has been a marked diminution of pleural effusion on the left following chest tube placement. On the minimal left-sided pleural effusion remains. No pneumothorax is evident. There is a small right pleural effusion. There is mild bibasilar atelectasis. There is a left ventricular assist device. There are pacemaker leads attached to the right and left heart, stable. Heart is enlarged with pulmonary vascularity within normal limits. No adenopathy evident. Feeding tube tip is below the diaphragm and not seen. Central catheter tip is at the cavoatrial junction, stable. IMPRESSION: Marked diminution of left-sided pleural effusion following chest tube placement on the left. There is a minimal left effusion currently with a small persistent right effusion,  stable. No edema or consolidation. Stable cardiac enlargement. Tube. And catheter positions as described. No pneumothorax. Electronically Signed   By: Bretta Bang III M.D.   On: 10/08/2015 09:43   Dg Chest Port 1 View  10/08/2015  CLINICAL DATA:  Shortness of breath, acute and chronic systolic and diastolic heart failure, left ventricular assist device in place, acute renal failure, pump anemia. EXAM: PORTABLE CHEST 1 VIEW COMPARISON:  Portable chest x-ray of October 07, 2015 FINDINGS: There has been further increase in interstitial density in the left upper lobe. Persistent alveolar opacity throughout the mid and lower left lung is present. The cardiac silhouette remains enlarged. There is a small right pleural effusion. The implantable pacemaker defibrillator is in stable position. The left ventricular assist device is also in stable position. The pulmonary vascularity is mildly engorged. The feeding tube tip projects below the inferior margin of the image. The PICC  line tip projects over the junction of the SVC with the right atrium. IMPRESSION: There has been slight interval worsening in the appearance of the mid and upper left lung interstitium consistent with atelectasis or less likely pneumonia. There is persistent left basilar opacity consistent with pleural fluid and atelectasis. A persistent small right pleural effusion is demonstrated. Stable enlargement of the cardiac silhouette with mild central pulmonary vascular congestion. The support apparatus is in stable position. Electronically Signed   By: David  [place] M.D.   On: 10/08/2015 07:52   Dg Abd Portable 1v  10/03/2015  CLINICAL DATA:  Left ventricular assist device EXAM: PORTABLE ABDOMEN - 1 VIEW COMPARISON:  None. FINDINGS: Left ventricular assist device is unchanged in position. Status post median sternotomy. Cardiac pacemaker leads partially visualized. Stable feeding tube position with tip in distal stomach. Mild intraluminal gas  within small bowel loops ankle probable mild ileus. No air-fluid levels are noted to suggest obstruction. IMPRESSION: Stable left ventricular assist device position. Status post median sternotomy. Stable NG feeding tube position. Mild intraluminal gas within small bowel loops and colon probable mild ileus. No evidence of small bowel obstruction. Electronically Signed   By: Natasha Mead M.D.   On: 10/21/2015 08:15     Medications:     Scheduled Medications: . amiodarone  200 mg Oral Daily  . anidulafungin  100 mg Intravenous Q24H  . aspirin  81 mg Oral Daily  . ceFEPime (MAXIPIME) IV  1 g Intravenous Q24H  . docusate sodium  200 mg Oral Daily  . feeding supplement  237 mL Oral TID WC  . furosemide  40 mg Intravenous Once  . insulin aspart  0-5 Units Subcutaneous QHS  . insulin aspart  0-9 Units Subcutaneous TID WC  . insulin glargine  14 Units Subcutaneous BID  . lactobacillus acidophilus & bulgar  1 tablet Oral BID  . levofloxacin  500 mg Oral Q48H  . pantoprazole  40 mg Oral Daily  . sildenafil  40 mg Oral TID  . sodium chloride  10 mL Intravenous Q12H  . sodium chloride  10-40 mL Intracatheter Q12H  . vancomycin  1,000 mg Intravenous Q48H  . warfarin  1 mg Oral q1800  . Warfarin - Physician Dosing Inpatient   Does not apply q1800    Infusions: . sodium chloride Stopped (09/25/15 1100)  . sodium chloride 250 mL (10/03/2015 0816)  . epinephrine 4 mcg/min (10/25/2015 0800)  . feeding supplement (OSMOLITE 1.5 CAL) 1,000 mL (10/08/15 1200)  . lactated ringers Stopped (09/26/15 0000)  . lactated ringers Stopped (09/24/15 0700)  . milrinone 0.25 mcg/kg/min (10/04/2015 0800)  . norepinephrine (LEVOPHED) Adult infusion 5 mcg/min (10/22/2015 0851)    PRN Medications: sodium chloride, acetaminophen, Gerhardt's butt cream, hydrALAZINE, ondansetron (ZOFRAN) IV, oxyCODONE, sodium chloride, sodium chloride   Assessment/Plan   1. Acute on chronic systolic HF -> Cardiogenic shock: NICM with EF  5%, mild to moderate RV hypokinesis.  S/p St Jude CRT-D. s/p HM II LVAD placement 09/25/2015. -  Difficulty night. Now epi 4, levo at 5, milrinone at 0.25.  CVP ~14-15  Off diuretics for now.  Renal function trending up.   Add ted hose.  2. Atrial fibrillation: Paroxysmal.    - Maintaining sinus  - On coumadin and po amiodarone. INR 2.9   today. 3. 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   - stable. quiescent 4. AKI on CKD: Creatinine 1.9 prior to admission.  - Creatinine continues to rise. Todays creatinine 4.2.  Nephrology consulted will  likely need CVVHD 5. Pulmonary HTN:   - Continue sildenafil 40 tid. 6. Anticoagulation:  - INR 2.86 => 3.46 => 2.56.=> 2.8 => 1.8 => 1.73=>2.1.=>2.8=>3.34=> 2.68=>2.9  Per Dr Maren Beach.  - On ASA 81 mg daily.   7. Septic shock: Now with fungemia and stenotrophamonas in sputum -  Improved and anidulafung , vanc, cefepime, and levofloxacin.   -  ID following. WBC coming down. Tmax 102.8.  Pan culture sent yesterday.  -8. Hypernatremia -resolved 9. HTN -Trending up . Hydralazine ordered as need. Hold Hydralazine.  On Epi and levo.  10.Deconditioning - Rehab slowed down due to infection. Recommend CIR. CIR following. PT/OT on hold.   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 Urology consulted before LVAD with foley placed. Foley was removed 10/05/15. Urine culture sent. Urology placed foley 1/11.   12. Anemia- Received 1 UPRBCs on 10/07/15. Todays Hgb 8.1. Receiving another unit of blood.   13. L Pleural Effusion- S/P chest tube. Output slowing.      I reviewed the LVAD parameters from today, and compared the results to the patient's prior recorded data.  No programming changes were made.  The LVAD is functioning within specified parameters. LVAD interrogation was negative for any significant power changes, alarms or PI events/speed drops.  LVAD equipment check completed and is in good working order.  Back-up equipment present.   Length of Stay: 5  Amy Clegg NP-C     10/24/2015, 9:09 AM  VAD Team --- VAD ISSUES ONLY--- Pager [phone] (7am - 7am)   Patient seen and examined with Tonye Becket, NP. We discussed all aspects of the encounter. I agree with the assessment and plan as stated above.   He remains critically ill but seems slightly better today. Sepsis seems to be improving on current regimen. Received 1u RBCs for anemia. Creatinine continues to get worse and is volume overloaded. D/w Renal and will begin CVVHD today. Continue TFs for malnutrition. VAD parameters stabilized.   The patient is critically ill with multiple organ systems failure and requires high complexity decision making for assessment and support, frequent evaluation and titration of therapies, application of advanced monitoring technologies and extensive interpretation of multiple databases.   Critical Care Time devoted to patient care services described in this note is 35 Minutes.  Harlan Ervine,MD 3:49 PM

## 2015-10-09 NOTE — Progress Notes (Signed)
CRITICAL VALUE ALERT  Critical value received:Platelets [id number]  Date of notification:  10/29/2015  Time of notification:  1530  Critical value read back:Yes.    Nurse who received alert:  Janey Greaser  MD notified (1st page):  Dr. Maren Beach paged at [phone]  Time of first page:  1545 MD notified (2nd page):  Time of second page:  Responding MD: Dr. Maren Beach  Time MD responded:  [phone]

## 2015-10-09 NOTE — Progress Notes (Signed)
CT surgery HeartMate 2 Rounding Note POD #16  Heartmate 2 implantation   Subjective:   Class 4 CHF with cardiogenic shock, hx nonischemic    Cardiomyopathy Preop IABP Hx BPH, TURP and bladder outlet obstruction required cystoscopy to place foley\ probable UTI preop Preop severe protein loss malnutrition, prealbumin < 8 Pre and Post-op acute on chronic renal failure Preop guaiac + stool Preop chronic Eliquis for a-fib Postop acute on chronic anemia Postop fungemia Postop L pleural effusion requiring chest tube x2-   Patient with clinical deterioration 2 previous days , Pro calcitonin marker and clinical signs of sepsis. Antibiotic coverage has been increased. Patient has been replaced on pressors . Cultures are pending.  Repeat left chest tube 28 French placed yesterday with drainage 1 L of serosanguineous pleural effusion-cultures negative so far. Creatinine continues to rise now 4.2, urine output decreased--renal consult placed Echocardiogram  shows good unloading of the LV but with persistent moderate RV dysfunction so we'll continue milrinone , and epinephrine and norepinephrine as needed to optimize renal blood flow and renal vascular gradient    Incisions clean, dry   INR now  2.8 - coumadin 1 mg  continue aspirin 81 mg VAD parameters satisfactory w/o PI events  Currently on HS tube feeds for protein malnutrition  LVAD INTERROGATION:  HeartMate II LVAD:  Flow 8.3 liters/min, speed 9000 rpm, power 5.2, PI 4.2  Controller intact  Objective:    Vital Signs:   Temp:  [98.3 F (36.8 C)-102.8 F (39.3 C)] 98.7 F (37.1 C) (01/13 0945) Pulse Rate:  [90-92] 90 (01/13 0900) Resp:  [16-33] 25 (01/13 0900) SpO2:  [91 %-98 %] 96 % (01/13 0900) Weight:  [141 lb 4.8 oz (64.093 kg)] 141 lb 4.8 oz (64.093 kg) (01/13 0630) Last BM Date: 10/06/15 Mean arterial Pressure 90-95  Intake/Output:   Intake/Output Summary (Last 24 hours) at 09/28/2015 1007 Last data filed at 10/21/2015  0930  Gross per 24 hour  Intake 3300.6 ml  Output   1045 ml  Net 2255.6 ml     Physical Exam: General:  Appears improved today but still weak and fragile. Currently out of bed and chair.. No resp difficulty after drainage of left pleural effusion HEENT: normal Neck: supple. no JVP  No carotid pulses; no bruits. No lymphadenopathy or thryomegaly appreciated. Cor: Mechanical heart sounds with LVAD hum present. Lungs: clear. Improved breath sounds after placement of left chest tube Abdomen: soft, nontender, nondistended. No hepatosplenomegaly. No bruits or masses. Good bowel sounds. Extremities: no cyanosis, clubbing, rash, mild- mod extremity  edema Neuro: alert & orientedx3, cranial nerves grossly intact.             Generally weak with poor ambulation ability after              developing fungemia followed by generalized sepsis  Telemetry: paced rhythm 90  Labs: Basic Metabolic Panel:  Recent Labs Lab 10/06/15 0510 10/07/15 0502 10/07/15 1712 10/08/15 0335 10/08/15 0530 10/08/15 1248 09/28/2015 0435  NA 140 142 145 143 143 142 142  K 4.0 4.1 4.3 6.7* 4.8 4.2 4.5  CL 108 108 111 111 111 108 109  CO2 23 22  --  24 23  --  22  GLUCOSE 202* 105* 60* 132* 135* 241* 117*  BUN 42* 52* 48* 63* 62* 67* 72*  CREATININE 2.70* 3.00* 3.30* 3.75* 3.85* 3.70* 4.22*  CALCIUM 8.6* 9.1  --  8.9 9.0  --  9.1    Liver Function Tests:  Recent Labs Lab 10/08/2015 0435  AST 52*  ALT 23  ALKPHOS 122  BILITOT 0.5  PROT 6.0*  ALBUMIN 3.3*   No results for input(s): LIPASE, AMYLASE in the last 168 hours. No results for input(s): AMMONIA in the last 168 hours.  CBC:  Recent Labs Lab 10/06/15 0407 10/07/15 0502 10/07/15 1300 10/07/15 1712 10/08/15 0335 10/08/15 1248 10/27/2015 0435  WBC 15.0* 10.9* 10.0  --  10.3  --  11.5*  HGB 10.0* 8.3* 9.0* 10.2* 8.9* 9.5* 8.1*  HCT 29.6* 24.4* 26.2* 30.0* 26.7* 28.0* 24.0*  MCV 87.6 83.8 84.8  --  87.5  --  86.6  PLT 725* 740* 703*  --  718*   --  841*    INR:  Recent Labs Lab 10/05/15 0528 10/06/15 0407 10/07/15 0502 10/08/15 0430 10/05/2015 0435  INR 2.10* 2.87* 3.34* 2.68* 2.91*    Other results:  EKG:   Imaging: Dg Chest Port 1 View  10/02/2015  CLINICAL DATA:  INSERTION OF IMPLANTABLE LEFT VENTRICULAR ASSIST DEVICE (N/A Chest) TRANSESOPHAGEAL ECHOCARDIOGRAM (TEE) (N/A ) [id number] CPT (R) EXAM: PORTABLE CHEST 1 VIEW COMPARISON:  10/08/2015 FINDINGS: Cardiac silhouette remains enlarged. There is no mediastinal widening. Visible portion of the ventricular assist device is stable projecting over the left ventricle. There is persistent opacity in the lung bases and left perihilar region consistent with a combination of pleural effusions and atelectasis. No overt pulmonary edema. Right subclavian central venous line, left-sided chest tube, enteric feeding tube and biventricular cardioverter-defibrillator are all stable in well positioned. No pneumothorax. IMPRESSION: 1. Findings are similar to the previous day's study allowing for differences in technique. 2. Persistent lower lung zone and left perihilar opacity consistent with a combination of pleural effusions and atelectasis. No overt pulmonary edema. 3. No pneumothorax. 4. Support apparatus is stable and well positioned. Electronically Signed   By: Amie Portland M.D.   On: 10/17/2015 08:14   Dg Chest Port 1 View  10/08/2015  CLINICAL DATA:  Status post left-sided chest tube placement EXAM: PORTABLE CHEST 1 VIEW COMPARISON:  Study obtained earlier in the day FINDINGS: There is no a chest tube on the left. There has been a marked diminution of pleural effusion on the left following chest tube placement. On the minimal left-sided pleural effusion remains. No pneumothorax is evident. There is a small right pleural effusion. There is mild bibasilar atelectasis. There is a left ventricular assist device. There are pacemaker leads attached to the right and left heart, stable. Heart is  enlarged with pulmonary vascularity within normal limits. No adenopathy evident. Feeding tube tip is below the diaphragm and not seen. Central catheter tip is at the cavoatrial junction, stable. IMPRESSION: Marked diminution of left-sided pleural effusion following chest tube placement on the left. There is a minimal left effusion currently with a small persistent right effusion, stable. No edema or consolidation. Stable cardiac enlargement. Tube. And catheter positions as described. No pneumothorax. Electronically Signed   By: Bretta Bang III M.D.   On: 10/08/2015 09:43   Dg Chest Port 1 View  10/08/2015  CLINICAL DATA:  Shortness of breath, acute and chronic systolic and diastolic heart failure, left ventricular assist device in place, acute renal failure, pump anemia. EXAM: PORTABLE CHEST 1 VIEW COMPARISON:  Portable chest x-ray of October 07, 2015 FINDINGS: There has been further increase in interstitial density in the left upper lobe. Persistent alveolar opacity throughout the mid and lower left lung is present. The cardiac silhouette remains enlarged. There is  a small right pleural effusion. The implantable pacemaker defibrillator is in stable position. The left ventricular assist device is also in stable position. The pulmonary vascularity is mildly engorged. The feeding tube tip projects below the inferior margin of the image. The PICC line tip projects over the junction of the SVC with the right atrium. IMPRESSION: There has been slight interval worsening in the appearance of the mid and upper left lung interstitium consistent with atelectasis or less likely pneumonia. There is persistent left basilar opacity consistent with pleural fluid and atelectasis. A persistent small right pleural effusion is demonstrated. Stable enlargement of the cardiac silhouette with mild central pulmonary vascular congestion. The support apparatus is in stable position. Electronically Signed   By: David  [place] M.D.    On: 10/08/2015 07:52   Dg Abd Portable 1v  09/28/2015  CLINICAL DATA:  Left ventricular assist device EXAM: PORTABLE ABDOMEN - 1 VIEW COMPARISON:  None. FINDINGS: Left ventricular assist device is unchanged in position. Status post median sternotomy. Cardiac pacemaker leads partially visualized. Stable feeding tube position with tip in distal stomach. Mild intraluminal gas within small bowel loops ankle probable mild ileus. No air-fluid levels are noted to suggest obstruction. IMPRESSION: Stable left ventricular assist device position. Status post median sternotomy. Stable NG feeding tube position. Mild intraluminal gas within small bowel loops and colon probable mild ileus. No evidence of small bowel obstruction. Electronically Signed   By: Natasha Mead M.D.   On: 10/22/2015 08:15     Medications:     Scheduled Medications: . amiodarone  200 mg Oral Daily  . anidulafungin  100 mg Intravenous Q24H  . aspirin  81 mg Oral Daily  . ceFEPime (MAXIPIME) IV  1 g Intravenous Q24H  . docusate sodium  200 mg Oral Daily  . feeding supplement  237 mL Oral TID WC  . furosemide  40 mg Intravenous Once  . insulin aspart  0-5 Units Subcutaneous QHS  . insulin aspart  0-9 Units Subcutaneous TID WC  . insulin glargine  14 Units Subcutaneous BID  . lactobacillus acidophilus & bulgar  1 tablet Oral BID  . levofloxacin  500 mg Oral Q48H  . pantoprazole  40 mg Oral Daily  . sildenafil  40 mg Oral TID  . sodium chloride  10 mL Intravenous Q12H  . sodium chloride  10-40 mL Intracatheter Q12H  . vancomycin  1,000 mg Intravenous Q48H  . warfarin  1 mg Oral q1800  . Warfarin - Physician Dosing Inpatient   Does not apply q1800    Infusions: . sodium chloride Stopped (09/25/15 1100)  . sodium chloride 250 mL (10/15/2015 0816)  . epinephrine 4 mcg/min (09/30/2015 0900)  . feeding supplement (OSMOLITE 1.5 CAL) 1,000 mL (10/08/15 1200)  . lactated ringers Stopped (09/26/15 0000)  . lactated ringers Stopped  (09/24/15 0700)  . milrinone 0.25 mcg/kg/min (10/05/2015 0900)  . norepinephrine (LEVOPHED) Adult infusion 5 mcg/min (10/08/2015 0851)    PRN Medications: sodium chloride, acetaminophen, Gerhardt's butt cream, hydrALAZINE, ondansetron (ZOFRAN) IV, oxyCODONE, sodium chloride, sodium chloride   Assessment:  VAD flow parameters and function are satisfactory Echocardiogram performed earlier this week shows adequate RV function and good decompression of LV.  Patient's clinical condition however has deteriorated-  Started with Candida species in the  the blood and now with clinical severe sepsis and multi-system failure. I discussed the situation with the patient's wife and she understands the probable cause of his his deterioration.  Since placing left chest tube his clinical  condition has improved with better blood pressure and overall strength. However his renal function and azotemia have worsened and a renal consult has been placed.  We'll continue continuous tube feeds to try to improve his nutritional status--currently with severe protein malnutrition.  His hemoglobin has dropped to 8.1 and we will give him 1 unit of packed cells today for anemia of chronic disease.  Plan/Discussion:      I reviewed the LVAD parameters from today, and compared the results to the patient's prior recorded data.  No programming changes were made.  The LVAD is functioning within specified parameters.  The patient performs LVAD self-test daily.  LVAD interrogation was negative for any significant power changes, alarms or PI events/speed drops.  LVAD equipment check completed and is in good working order.  Back-up equipment present.   LVAD education done on emergency procedures and precautions and reviewed exit site care.  Length of Stay: [address]  Kathlee Nations Trigt III 10/17/2015, 10:07 AM

## 2015-10-09 NOTE — Progress Notes (Signed)
PT Cancellation Note  Patient Details Name: Brad [name] MRN: [id number] DOB: Jun 30, 1941   Cancelled Treatment:    Reason Eval/Treat Not Completed: Medical issues which prohibited therapy.  Spoke with RN. Pt on Epi and Levo. Will hold today and check on pt Monday.     Angelette Ganus, [name] 09/29/2015, 2:11 PM

## 2015-10-09 NOTE — Progress Notes (Signed)
Pharmacy: Antibiotic dose adjustments for CRRT  74yom s/p LVAD continues on day 9 anidulafungin, day 4 levofloxacin, and day 2 vancomycin and cefepime. Renal function worsening and he is starting CRRT today. Will adjust antibiotic doses accordingly.  Last doses given: Cefepime 1g 1/13 @ 1233 Levofloxacin 500mg  1/12 @ 1006 Vancomycin 1g 1/11 @ 1314   Plan: 1) Continue anidulafungin 100mg  IV q24 (no adjustment needed) 2) Change levofloxacin to 250mg  po q24 3) Change vancomycin to 750mg  IV q24 (does not need any further loading dose) 4) Change cefepime to 2g IV q12  Louie Casa, PharmD, BCPS 10/05/2015, 12:55 PM

## 2015-10-09 NOTE — Progress Notes (Signed)
OT Cancellation Note  Patient Details Name: Brad [name] MRN: [id number] DOB: 1941-06-11   Cancelled Treatment:    Reason Eval/Treat Not Completed: Medical issues which prohibited therapy. Spoke with RN. Pt on Epi and Levo. Will hold today and check on pt Monday.   [facility] Jeralyn Nolden, OTR/L  [phone] 10/18/2015 10/14/2015, 9:35 AM

## 2015-10-09 NOTE — Consult Note (Addendum)
Reason for Consult: Acute Kidney Injury Referring Physician: Dr. Haroldine Laws  Chief Complaint: Cardiogenic Shock  Assessment: 1. Acute Kidney Injury on CKD with BL creatinine in the 1.7-2 range - Currently in ATN secondary to hypotension + PNA + exudate effusion + fungemia. Patient also on Bactrim (1/7-1/10) but ATN likely more from the hypotension and infectious etiology than medication related. UOP has been decreasing as well and patient becoming more overloaded currently on Milrinone, Levophed, and Epinephrine (off Dopamine). - I personally evaluated a urine microscopy and there was evidence of ATN. - May consider trial with IV diuretics but may not respond because of the ATN and with the soft blood pressures with pressors  I am very concerned that he will drop his blood pressures more with the venodilation. - He is moving towards requiring CRRT to manage his volume status --> will d/w Dr. Haroldine Laws and Dr. Prescott Gum. - Meds are already being renally dosed; will need to adjust when pt is on CRRT.  2. PNA with exudative left sided effusion - currently still has a CT. 3. Paroxysmal Afib - On coumadin and amiodarone - Sinus at time of exam. 4. Pulmonary HTN 5. Fungemia and stenotrophamonas sputum - On anidulafungin (no renal dosing), levaquin renally dosed. 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    HPI: Brad [name] is an 75 y.o. male who was transferred for cardiogenic shock w/ an EF of 5% with LVAD placement on 09/22/2015. He also has a h/o prostatic urethral stricture s/p TURP 2003 with recent dilatation in 08/2015. He has had a complicated course with an initial creatine in the 2's which improved to the 1.7-2 range with fluctuation but recently since 1/7 the creatinine has started to rise consistently with decreasing UOP as well. He has been on Milrinone during the hospitaliaation and is back on Levophed + Epinephrine, currently off Dopamine. He was treated for an  UTI, PNA with exudative pleural efusion + CT placement as well. He has had fungemia and Stenotrophomonas in the sputum treated with Zosyn + imipenem currently on anidulafungin. He was also on Bactrim 1/7-1/10.   ROS Pertinent items are noted in HPI.  Chemistry and CBC: CREATININE, SER  Date/Time Value Ref Range Status  10/20/2015 04:35 AM 4.22* 0.61 - 1.24 mg/dL Final  10/08/2015 12:48 PM 3.70* 0.61 - 1.24 mg/dL Final  10/08/2015 05:30 AM 3.85* 0.61 - 1.24 mg/dL Final  10/08/2015 03:35 AM 3.75* 0.61 - 1.24 mg/dL Final  10/07/2015 05:12 PM 3.30* 0.61 - 1.24 mg/dL Final  10/07/2015 05:02 AM 3.00* 0.61 - 1.24 mg/dL Final  10/06/2015 05:10 AM 2.70* 0.61 - 1.24 mg/dL Final  10/05/2015 05:04 PM 2.20* 0.61 - 1.24 mg/dL Final  10/05/2015 05:28 AM 2.37* 0.61 - 1.24 mg/dL Final  10/04/2015 04:40 AM 2.13* 0.61 - 1.24 mg/dL Final  10/03/2015 03:29 PM 2.00* 0.61 - 1.24 mg/dL Final  10/03/2015 04:55 AM 2.25* 0.61 - 1.24 mg/dL Final  10/02/2015 04:25 AM 2.32* 0.61 - 1.24 mg/dL Final  10/01/2015 05:41 PM 1.90* 0.61 - 1.24 mg/dL Final  10/01/2015 04:15 AM 2.38* 0.61 - 1.24 mg/dL Final  09/30/2015 05:00 AM 2.61* 0.61 - 1.24 mg/dL Final  09/29/2015 05:15 AM 2.90* 0.61 - 1.24 mg/dL Final  09/28/2015 04:25 AM 3.12* 0.61 - 1.24 mg/dL Final  09/27/2015 03:45 AM 3.20* 0.61 - 1.24 mg/dL Final  09/26/2015 04:34 PM 2.80* 0.61 - 1.24 mg/dL Final  09/26/2015 04:48 AM 2.64* 0.61 - 1.24 mg/dL Final  09/25/2015 05:41 PM 2.20* 0.61 -  1.24 mg/dL Final  09/25/2015 03:40 AM 1.97* 0.61 - 1.24 mg/dL Final  09/24/2015 04:25 PM 1.60* 0.61 - 1.24 mg/dL Final  09/24/2015 04:15 PM 1.81* 0.61 - 1.24 mg/dL Final  09/24/2015 04:50 AM 1.40* 0.61 - 1.24 mg/dL Final  09/25/2015 09:07 PM 1.20 0.61 - 1.24 mg/dL Final  09/22/2015 09:00 PM 1.44* 0.61 - 1.24 mg/dL Final  09/12/2015 04:00 PM 1.42* 0.61 - 1.24 mg/dL Final  09/26/2015 01:26 PM 1.10 0.61 - 1.24 mg/dL Final  09/20/2015 11:56 AM 1.00 0.61 - 1.24 mg/dL Final  08/29/2015  11:32 AM 1.10 0.61 - 1.24 mg/dL Final  09/20/2015 11:01 AM 1.20 0.61 - 1.24 mg/dL Final  09/26/2015 08:56 AM 1.20 0.61 - 1.24 mg/dL Final  09/26/2015 05:34 AM 1.67* 0.61 - 1.24 mg/dL Final  09/22/2015 07:00 AM 1.61* 0.61 - 1.24 mg/dL Final  09/21/2015 05:44 AM 1.93* 0.61 - 1.24 mg/dL Final  09/20/2015 05:40 AM 2.08* 0.61 - 1.24 mg/dL Final  09/19/2015 06:50 PM 2.31* 0.61 - 1.24 mg/dL Final  09/19/2015 05:00 AM 2.39* 0.61 - 1.24 mg/dL Final  09/15/2015 10:30 PM 2.65* 0.61 - 1.24 mg/dL Final  09/01/2015 05:33 AM 2.56* 0.61 - 1.24 mg/dL Final  09/17/2015 07:42 AM 2.00* 0.61 - 1.24 mg/dL Final  09/05/2015 08:17 PM 2.04* 0.61 - 1.24 mg/dL Final    Recent Labs Lab 10/04/15 0440 10/05/15 0528  10/06/15 0510 10/07/15 0502 10/07/15 1712 10/08/15 0335 10/08/15 0530 10/08/15 1248 10/21/2015 0435  NA 144 142  < > 140 142 145 143 143 142 142  K 3.6 4.0  < > 4.0 4.1 4.3 6.7* 4.8 4.2 4.5  CL 110 108  < > 108 108 111 111 111 108 109  CO2 23 24  --  23 22  --  24 23  --  22  GLUCOSE 293* 196*  < > 202* 105* 60* 132* 135* 241* 117*  BUN 34* 38*  < > 42* 52* 48* 63* 62* 67* 72*  CREATININE 2.13* 2.37*  < > 2.70* 3.00* 3.30* 3.75* 3.85* 3.70* 4.22*  CALCIUM 9.0 9.1  --  8.6* 9.1  --  8.9 9.0  --  9.1  < > = values in this interval not displayed.  Recent Labs Lab 10/07/15 0502 10/07/15 1300 10/07/15 1712 10/08/15 0335 10/08/15 1248 10/27/2015 0435  WBC 10.9* 10.0  --  10.3  --  11.5*  HGB 8.3* 9.0* 10.2* 8.9* 9.5* 8.1*  HCT 24.4* 26.2* 30.0* 26.7* 28.0* 24.0*  MCV 83.8 84.8  --  87.5  --  86.6  PLT 740* 703*  --  718*  --  841*   Liver Function Tests:  Recent Labs Lab 10/08/2015 0435  AST 52*  ALT 23  ALKPHOS 122  BILITOT 0.5  PROT 6.0*  ALBUMIN 3.3*   No results for input(s): LIPASE, AMYLASE in the last 168 hours. No results for input(s): AMMONIA in the last 168 hours. Cardiac Enzymes: No results for input(s): CKTOTAL, CKMB, CKMBINDEX, TROPONINI in the last 168 hours. Iron  Studies: No results for input(s): IRON, TIBC, TRANSFERRIN, FERRITIN in the last 72 hours. PT/INR: _0 (inr:5)  Xrays/Other Studies: ) Results for orders placed or performed during the hospital encounter of 09/02/2015 (from the past 48 hour(s))  Glucose, capillary     Status: None   Collection Time: 10/07/15 12:32 PM  Result Value Ref Range   Glucose-Capillary 69 65 - 99 mg/dL   Comment 1 Notify RN   Glucose, capillary     Status: Abnormal  Collection Time: 10/07/15 12:35 PM  Result Value Ref Range   Glucose-Capillary 55 (L) 65 - 99 mg/dL   Comment 1 Notify RN   CBC     Status: Abnormal   Collection Time: 10/07/15  1:00 PM  Result Value Ref Range   WBC 10.0 4.0 - 10.5 K/uL   RBC 3.09 (L) 4.22 - 5.81 MIL/uL   Hemoglobin 9.0 (L) 13.0 - 17.0 g/dL   HCT 26.2 (L) 39.0 - 52.0 %   MCV 84.8 78.0 - 100.0 fL   MCH 29.1 26.0 - 34.0 pg   MCHC 34.4 30.0 - 36.0 g/dL   RDW 16.0 (H) 11.5 - 15.5 %   Platelets 703 (H) 150 - 400 K/uL  Prepare fresh frozen plasma     Status: None   Collection Time: 10/07/15  1:10 PM  Result Value Ref Range   Unit Number [id number]    Blood Component Type THAWED PLASMA    Unit division 00    Status of Unit ISSUED,FINAL    Transfusion Status OK TO TRANSFUSE   Glucose, capillary     Status: Abnormal   Collection Time: 10/07/15  1:38 PM  Result Value Ref Range   Glucose-Capillary 139 (H) 65 - 99 mg/dL   Comment 1 Capillary Specimen   Prepare fresh frozen plasma     Status: None   Collection Time: 10/07/15  2:27 PM  Result Value Ref Range   Unit Number [id number]    Blood Component Type THAWED PLASMA    Unit division 00    Status of Unit REL FROM [facility]    Transfusion Status OK TO TRANSFUSE   Urine culture     Status: None   Collection Time: 10/07/15  4:25 PM  Result Value Ref Range   Specimen Description URINE, CATHETERIZED    Special Requests NONE    Culture NO GROWTH 1 DAY    Report Status 10/08/2015 FINAL   Glucose, capillary     Status:  None   Collection Time: 10/07/15  4:38 PM  Result Value Ref Range   Glucose-Capillary 70 65 - 99 mg/dL   Comment 1 Venous Specimen    Comment 2 Notify RN   I-STAT, chem 8     Status: Abnormal   Collection Time: 10/07/15  5:12 PM  Result Value Ref Range   Sodium 145 135 - 145 mmol/L   Potassium 4.3 3.5 - 5.1 mmol/L   Chloride 111 101 - 111 mmol/L   BUN 48 (H) 6 - 20 mg/dL   Creatinine, Ser 3.30 (H) 0.61 - 1.24 mg/dL   Glucose, Bld 60 (L) 65 - 99 mg/dL   Calcium, Ion 1.30 1.13 - 1.30 mmol/L   TCO2 23 0 - 100 mmol/L   Hemoglobin 10.2 (L) 13.0 - 17.0 g/dL   HCT 30.0 (L) 39.0 - 52.0 %  Glucose, capillary     Status: Abnormal   Collection Time: 10/07/15  5:40 PM  Result Value Ref Range   Glucose-Capillary 60 (L) 65 - 99 mg/dL   Comment 1 Capillary Specimen   Glucose, capillary     Status: Abnormal   Collection Time: 10/07/15  6:13 PM  Result Value Ref Range   Glucose-Capillary 150 (H) 65 - 99 mg/dL   Comment 1 Capillary Specimen   Procalcitonin - Baseline     Status: None   Collection Time: 10/07/15  7:00 PM  Result Value Ref Range   Procalcitonin 3.51 ng/mL    Comment:  Interpretation: PCT > 2 ng/mL: Systemic infection (sepsis) is likely, unless other causes are known. (NOTE)         ICU PCT Algorithm               Non ICU PCT Algorithm    ----------------------------     ------------------------------         PCT < 0.25 ng/mL                 PCT < 0.1 ng/mL     Stopping of antibiotics            Stopping of antibiotics       strongly encouraged.               strongly encouraged.    ----------------------------     ------------------------------       PCT level decrease by               PCT < 0.25 ng/mL       >= 80% from peak PCT       OR PCT 0.25 - 0.5 ng/mL          Stopping of antibiotics                                             encouraged.     Stopping of antibiotics           encouraged.    ----------------------------     ------------------------------        PCT level decrease by              PCT >= 0.25 ng/mL       < 80% from peak PCT        AND PCT >= 0.5 ng/mL            Continuing antibiotics                                               encouraged.       Continuing antibiotics            encouraged.    ----------------------------     ------------------------------     PCT level increase compared          PCT > 0.5 ng/mL         with peak PCT AND          PCT >= 0.5 ng/mL             Escalation of antibiotics                                          strongly encouraged.      Escalation of antibiotics        strongly encouraged.   I-STAT 3, arterial blood gas (G3+)     Status: Abnormal   Collection Time: 10/07/15  8:27 PM  Result Value Ref Range   pH, Arterial 7.356 7.350 - 7.450   pCO2 arterial 37.8 35.0 - 45.0 mmHg   pO2, Arterial 60.0 (L) 80.0 - 100.0 mmHg   Bicarbonate 21.0 20.0 - 24.0 mEq/L  TCO2 22 0 - 100 mmol/L   O2 Saturation 89.0 %   Acid-base deficit 4.0 (H) 0.0 - 2.0 mmol/L   Patient temperature 99.6 F    Collection site RADIAL, ALLEN'S TEST ACCEPTABLE    Drawn by Operator    Sample type ARTERIAL   Glucose, capillary     Status: Abnormal   Collection Time: 10/07/15  9:56 PM  Result Value Ref Range   Glucose-Capillary 102 (H) 65 - 99 mg/dL   Comment 1 Capillary Specimen    Comment 2 Notify RN    Comment 3 Document in Chart   Lactate dehydrogenase     Status: Abnormal   Collection Time: 10/08/15  3:35 AM  Result Value Ref Range   LDH 522 (H) 98 - 192 U/L  Basic metabolic panel     Status: Abnormal   Collection Time: 10/08/15  3:35 AM  Result Value Ref Range   Sodium 143 135 - 145 mmol/L   Potassium 6.7 (HH) 3.5 - 5.1 mmol/L    Comment: MARKED HEMOLYSIS CRITICAL RESULT CALLED TO, READ BACK BY AND VERIFIED WITH: S LOVE,RN [id number] WILDERK    Chloride 111 101 - 111 mmol/L   CO2 24 22 - 32 mmol/L   Glucose, Bld 132 (H) 65 - 99 mg/dL   BUN 63 (H) 6 - 20 mg/dL   Creatinine, Ser 3.75 (H) 0.61 - 1.24  mg/dL   Calcium 8.9 8.9 - 10.3 mg/dL   GFR calc non Af Amer 15 (L) >60 mL/min   GFR calc Af Amer 17 (L) >60 mL/min    Comment: (NOTE) The eGFR has been calculated using the CKD EPI equation. This calculation has not been validated in all clinical situations. eGFR's persistently <60 mL/min signify possible Chronic Kidney Disease.    Anion gap 8 5 - 15  CBC     Status: Abnormal   Collection Time: 10/08/15  3:35 AM  Result Value Ref Range   WBC 10.3 4.0 - 10.5 K/uL   RBC 3.05 (L) 4.22 - 5.81 MIL/uL   Hemoglobin 8.9 (L) 13.0 - 17.0 g/dL   HCT 26.7 (L) 39.0 - 52.0 %   MCV 87.5 78.0 - 100.0 fL   MCH 29.2 26.0 - 34.0 pg   MCHC 33.3 30.0 - 36.0 g/dL   RDW 17.5 (H) 11.5 - 15.5 %   Platelets 718 (H) 150 - 400 K/uL  Procalcitonin     Status: None   Collection Time: 10/08/15  3:35 AM  Result Value Ref Range   Procalcitonin 3.74 ng/mL    Comment:        Interpretation: PCT > 2 ng/mL: Systemic infection (sepsis) is likely, unless other causes are known. (NOTE)         ICU PCT Algorithm               Non ICU PCT Algorithm    ----------------------------     ------------------------------         PCT < 0.25 ng/mL                 PCT < 0.1 ng/mL     Stopping of antibiotics            Stopping of antibiotics       strongly encouraged.               strongly encouraged.    ----------------------------     ------------------------------       PCT level decrease by  PCT < 0.25 ng/mL       >= 80% from peak PCT       OR PCT 0.25 - 0.5 ng/mL          Stopping of antibiotics                                             encouraged.     Stopping of antibiotics           encouraged.    ----------------------------     ------------------------------       PCT level decrease by              PCT >= 0.25 ng/mL       < 80% from peak PCT        AND PCT >= 0.5 ng/mL            Continuing antibiotics                                               encouraged.       Continuing antibiotics             encouraged.    ----------------------------     ------------------------------     PCT level increase compared          PCT > 0.5 ng/mL         with peak PCT AND          PCT >= 0.5 ng/mL             Escalation of antibiotics                                          strongly encouraged.      Escalation of antibiotics        strongly encouraged.   Carboxyhemoglobin (Co-ox)     Status: Abnormal   Collection Time: 10/08/15  3:38 AM  Result Value Ref Range   Total hemoglobin 8.8 (L) 13.5 - 18.0 g/dL   O2 Saturation 84.0 %   Carboxyhemoglobin 1.5 0.5 - 1.5 %   Methemoglobin 1.0 0.0 - 1.5 %  Protime-INR     Status: Abnormal   Collection Time: 10/08/15  4:30 AM  Result Value Ref Range   Prothrombin Time 28.1 (H) 11.6 - 15.2 seconds   INR 2.68 (H) 0.00 - 4.23  Basic metabolic panel     Status: Abnormal   Collection Time: 10/08/15  5:30 AM  Result Value Ref Range   Sodium 143 135 - 145 mmol/L   Potassium 4.8 3.5 - 5.1 mmol/L   Chloride 111 101 - 111 mmol/L   CO2 23 22 - 32 mmol/L   Glucose, Bld 135 (H) 65 - 99 mg/dL   BUN 62 (H) 6 - 20 mg/dL   Creatinine, Ser 3.85 (H) 0.61 - 1.24 mg/dL   Calcium 9.0 8.9 - 10.3 mg/dL   GFR calc non Af Amer 14 (L) >60 mL/min   GFR calc Af Amer 16 (L) >60 mL/min    Comment: (NOTE) The eGFR has been calculated using the CKD EPI equation. This calculation has not been validated  in all clinical situations. eGFR's persistently <60 mL/min signify possible Chronic Kidney Disease.    Anion gap 9 5 - 15  Lactate dehydrogenase     Status: Abnormal   Collection Time: 10/08/15  5:30 AM  Result Value Ref Range   LDH 383 (H) 98 - 192 U/L  Carboxyhemoglobin     Status: Abnormal   Collection Time: 10/08/15  6:01 AM  Result Value Ref Range   Total hemoglobin 8.6 (L) 13.5 - 18.0 g/dL   O2 Saturation 83.3 %   Carboxyhemoglobin 1.5 0.5 - 1.5 %   Methemoglobin 1.1 0.0 - 1.5 %  Blood gas, arterial     Status: Abnormal   Collection Time: 10/08/15  6:05 AM   Result Value Ref Range   FIO2 0.55    Delivery systems VENTURI MASK    pH, Arterial 7.358 7.350 - 7.450   pCO2 arterial 37.7 35.0 - 45.0 mmHg   pO2, Arterial 62.7 (L) 80.0 - 100.0 mmHg   Bicarbonate 20.7 20.0 - 24.0 mEq/L   TCO2 21.8 0 - 100 mmol/L   Acid-base deficit 3.9 (H) 0.0 - 2.0 mmol/L   O2 Saturation 91.8 %   Patient temperature 98.6    Collection site RIGHT RADIAL    Drawn by [id number]    Sample type ARTERIAL    Allens test (pass/fail) PASS PASS  Body fluid culture     Status: None (Preliminary result)   Collection Time: 10/08/15  9:20 AM  Result Value Ref Range   Specimen Description FLUID LEFT PLEURAL    Special Requests NONE    Gram Stain      ABUNDANT WBC PRESENT,BOTH PMN AND MONONUCLEAR NO ORGANISMS SEEN    Culture NO GROWTH < 24 HOURS    Report Status PENDING   Glucose, capillary     Status: Abnormal   Collection Time: 10/08/15  9:29 AM  Result Value Ref Range   Glucose-Capillary 216 (H) 65 - 99 mg/dL   Comment 1 Capillary Specimen    Comment 2 Notify RN   Culture, blood (routine x 2)     Status: None (Preliminary result)   Collection Time: 10/08/15 10:20 AM  Result Value Ref Range   Specimen Description BLOOD RIGHT HAND    Special Requests BOTTLES DRAWN AEROBIC AND ANAEROBIC 10CC    Culture PENDING    Report Status PENDING   Culture, blood (routine x 2)     Status: None (Preliminary result)   Collection Time: 10/08/15 10:31 AM  Result Value Ref Range   Specimen Description BLOOD LEFT HAND    Special Requests BOTTLES DRAWN AEROBIC AND ANAEROBIC 10CC    Culture PENDING    Report Status PENDING   Glucose, capillary     Status: Abnormal   Collection Time: 10/08/15 12:00 PM  Result Value Ref Range   Glucose-Capillary 234 (H) 65 - 99 mg/dL   Comment 1 Capillary Specimen    Comment 2 Notify RN   Culture, Urine     Status: None   Collection Time: 10/08/15 12:10 PM  Result Value Ref Range   Specimen Description URINE, CATHETERIZED    Special Requests  Immunocompromised    Culture NO GROWTH 1 DAY    Report Status 10/02/2015 FINAL   I-STAT, chem 8     Status: Abnormal   Collection Time: 10/08/15 12:48 PM  Result Value Ref Range   Sodium 142 135 - 145 mmol/L   Potassium 4.2 3.5 - 5.1 mmol/L   Chloride 108 101 -  111 mmol/L   BUN 67 (H) 6 - 20 mg/dL   Creatinine, Ser 3.70 (H) 0.61 - 1.24 mg/dL   Glucose, Bld 241 (H) 65 - 99 mg/dL   Calcium, Ion 1.27 1.13 - 1.30 mmol/L   TCO2 21 0 - 100 mmol/L   Hemoglobin 9.5 (L) 13.0 - 17.0 g/dL   HCT 28.0 (L) 39.0 - 52.0 %  Glucose, capillary     Status: Abnormal   Collection Time: 10/08/15  3:48 PM  Result Value Ref Range   Glucose-Capillary 155 (H) 65 - 99 mg/dL   Comment 1 Capillary Specimen    Comment 2 Notify RN   Glucose, capillary     Status: Abnormal   Collection Time: 10/08/15  9:53 PM  Result Value Ref Range   Glucose-Capillary 120 (H) 65 - 99 mg/dL   Comment 1 Capillary Specimen    Comment 2 Notify RN    Comment 3 Document in Chart   Lactate dehydrogenase     Status: Abnormal   Collection Time: 10/08/2015  4:35 AM  Result Value Ref Range   LDH 371 (H) 98 - 192 U/L  Protime-INR     Status: Abnormal   Collection Time: 10/27/2015  4:35 AM  Result Value Ref Range   Prothrombin Time 29.9 (H) 11.6 - 15.2 seconds   INR 2.91 (H) 0.00 - 4.80  Basic metabolic panel     Status: Abnormal   Collection Time: 10/26/2015  4:35 AM  Result Value Ref Range   Sodium 142 135 - 145 mmol/L   Potassium 4.5 3.5 - 5.1 mmol/L   Chloride 109 101 - 111 mmol/L   CO2 22 22 - 32 mmol/L   Glucose, Bld 117 (H) 65 - 99 mg/dL   BUN 72 (H) 6 - 20 mg/dL   Creatinine, Ser 4.22 (H) 0.61 - 1.24 mg/dL   Calcium 9.1 8.9 - 10.3 mg/dL   GFR calc non Af Amer 13 (L) >60 mL/min   GFR calc Af Amer 15 (L) >60 mL/min    Comment: (NOTE) The eGFR has been calculated using the CKD EPI equation. This calculation has not been validated in all clinical situations. eGFR's persistently <60 mL/min signify possible Chronic  Kidney Disease.    Anion gap 11 5 - 15  CBC     Status: Abnormal   Collection Time: 10/11/2015  4:35 AM  Result Value Ref Range   WBC 11.5 (H) 4.0 - 10.5 K/uL   RBC 2.77 (L) 4.22 - 5.81 MIL/uL   Hemoglobin 8.1 (L) 13.0 - 17.0 g/dL   HCT 24.0 (L) 39.0 - 52.0 %   MCV 86.6 78.0 - 100.0 fL   MCH 29.2 26.0 - 34.0 pg   MCHC 33.8 30.0 - 36.0 g/dL   RDW 17.0 (H) 11.5 - 15.5 %   Platelets 841 (H) 150 - 400 K/uL  Procalcitonin     Status: None   Collection Time: 10/01/2015  4:35 AM  Result Value Ref Range   Procalcitonin 4.02 ng/mL    Comment:        Interpretation: PCT > 2 ng/mL: Systemic infection (sepsis) is likely, unless other causes are known. (NOTE)         ICU PCT Algorithm               Non ICU PCT Algorithm    ----------------------------     ------------------------------         PCT < 0.25 ng/mL  PCT < 0.1 ng/mL     Stopping of antibiotics            Stopping of antibiotics       strongly encouraged.               strongly encouraged.    ----------------------------     ------------------------------       PCT level decrease by               PCT < 0.25 ng/mL       >= 80% from peak PCT       OR PCT 0.25 - 0.5 ng/mL          Stopping of antibiotics                                             encouraged.     Stopping of antibiotics           encouraged.    ----------------------------     ------------------------------       PCT level decrease by              PCT >= 0.25 ng/mL       < 80% from peak PCT        AND PCT >= 0.5 ng/mL            Continuing antibiotics                                               encouraged.       Continuing antibiotics            encouraged.    ----------------------------     ------------------------------     PCT level increase compared          PCT > 0.5 ng/mL         with peak PCT AND          PCT >= 0.5 ng/mL             Escalation of antibiotics                                          strongly encouraged.       Escalation of antibiotics        strongly encouraged.   Hepatic function panel     Status: Abnormal   Collection Time: 10/13/2015  4:35 AM  Result Value Ref Range   Total Protein 6.0 (L) 6.5 - 8.1 g/dL   Albumin 3.3 (L) 3.5 - 5.0 g/dL   AST 52 (H) 15 - 41 U/L   ALT 23 17 - 63 U/L   Alkaline Phosphatase 122 38 - 126 U/L   Total Bilirubin 0.5 0.3 - 1.2 mg/dL   Bilirubin, Direct 0.2 0.1 - 0.5 mg/dL   Indirect Bilirubin 0.3 0.3 - 0.9 mg/dL  Prepare RBC     Status: None   Collection Time: 10/23/2015  7:20 AM  Result Value Ref Range   Order Confirmation ORDER PROCESSED BY BLOOD BANK   Glucose, capillary     Status: Abnormal   Collection Time: 10/22/2015  8:39 AM  Result Value Ref Range   Glucose-Capillary 124 (H) 65 - 99 mg/dL   Comment 1 Notify RN   Carboxyhemoglobin (Co-ox)     Status: Abnormal   Collection Time: 10/06/2015  9:50 AM  Result Value Ref Range   Total hemoglobin 7.7 (L) 13.5 - 18.0 g/dL   O2 Saturation 84.5 %   Carboxyhemoglobin 1.9 (H) 0.5 - 1.5 %   Methemoglobin 0.9 0.0 - 1.5 %   Dg Chest Port 1 View  10/12/2015  CLINICAL DATA:  INSERTION OF IMPLANTABLE LEFT VENTRICULAR ASSIST DEVICE (N/A Chest) TRANSESOPHAGEAL ECHOCARDIOGRAM (TEE) (N/A ) [id number] CPT (R) EXAM: PORTABLE CHEST 1 VIEW COMPARISON:  10/08/2015 FINDINGS: Cardiac silhouette remains enlarged. There is no mediastinal widening. Visible portion of the ventricular assist device is stable projecting over the left ventricle. There is persistent opacity in the lung bases and left perihilar region consistent with a combination of pleural effusions and atelectasis. No overt pulmonary edema. Right subclavian central venous line, left-sided chest tube, enteric feeding tube and biventricular cardioverter-defibrillator are all stable in well positioned. No pneumothorax. IMPRESSION: 1. Findings are similar to the previous day's study allowing for differences in technique. 2. Persistent lower lung zone and left perihilar opacity  consistent with a combination of pleural effusions and atelectasis. No overt pulmonary edema. 3. No pneumothorax. 4. Support apparatus is stable and well positioned. Electronically Signed   By: Lajean Manes M.D.   On: 10/02/2015 08:14   Dg Chest Port 1 View  10/08/2015  CLINICAL DATA:  Status post left-sided chest tube placement EXAM: PORTABLE CHEST 1 VIEW COMPARISON:  Study obtained earlier in the day FINDINGS: There is no a chest tube on the left. There has been a marked diminution of pleural effusion on the left following chest tube placement. On the minimal left-sided pleural effusion remains. No pneumothorax is evident. There is a small right pleural effusion. There is mild bibasilar atelectasis. There is a left ventricular assist device. There are pacemaker leads attached to the right and left heart, stable. Heart is enlarged with pulmonary vascularity within normal limits. No adenopathy evident. Feeding tube tip is below the diaphragm and not seen. Central catheter tip is at the cavoatrial junction, stable. IMPRESSION: Marked diminution of left-sided pleural effusion following chest tube placement on the left. There is a minimal left effusion currently with a small persistent right effusion, stable. No edema or consolidation. Stable cardiac enlargement. Tube. And catheter positions as described. No pneumothorax. Electronically Signed   By: Lowella Grip III M.D.   On: 10/08/2015 09:43   Dg Chest Port 1 View  10/08/2015  CLINICAL DATA:  Shortness of breath, acute and chronic systolic and diastolic heart failure, left ventricular assist device in place, acute renal failure, pump anemia. EXAM: PORTABLE CHEST 1 VIEW COMPARISON:  Portable chest x-ray of October 07, 2015 FINDINGS: There has been further increase in interstitial density in the left upper lobe. Persistent alveolar opacity throughout the mid and lower left lung is present. The cardiac silhouette remains enlarged. There is a small right  pleural effusion. The implantable pacemaker defibrillator is in stable position. The left ventricular assist device is also in stable position. The pulmonary vascularity is mildly engorged. The feeding tube tip projects below the inferior margin of the image. The PICC line tip projects over the junction of the SVC with the right atrium. IMPRESSION: There has been slight interval worsening in the appearance of the mid and upper left lung interstitium consistent with atelectasis or less likely pneumonia.  There is persistent left basilar opacity consistent with pleural fluid and atelectasis. A persistent small right pleural effusion is demonstrated. Stable enlargement of the cardiac silhouette with mild central pulmonary vascular congestion. The support apparatus is in stable position. Electronically Signed   By: David  Martinique M.D.   On: 10/08/2015 07:52   Dg Abd Portable 1v  09/29/2015  CLINICAL DATA:  Left ventricular assist device EXAM: PORTABLE ABDOMEN - 1 VIEW COMPARISON:  None. FINDINGS: Left ventricular assist device is unchanged in position. Status post median sternotomy. Cardiac pacemaker leads partially visualized. Stable feeding tube position with tip in distal stomach. Mild intraluminal gas within small bowel loops ankle probable mild ileus. No air-fluid levels are noted to suggest obstruction. IMPRESSION: Stable left ventricular assist device position. Status post median sternotomy. Stable NG feeding tube position. Mild intraluminal gas within small bowel loops and colon probable mild ileus. No evidence of small bowel obstruction. Electronically Signed   By: Lahoma Crocker M.D.   On: 09/30/2015 08:15    PMH:   Past Medical History  Diagnosis Date  . 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 CKD (chronic kidney disease) stage 3, GFR 30-59 ml/min   . V-tach ([place])   . PAF (paroxysmal atrial fibrillation) ([place])   . CVA (cerebral infarction)   . BPH (benign prostatic  hyperplasia)   . HTN (hypertension), benign   . Hyperlipidemia     PSH:   Past Surgical History  Procedure Laterality Date  . Cardiac catheterization N/A 09/24/2015    Procedure: Right Heart Cath;  Surgeon: Jolaine Artist, MD;  Location: [facility];  Service: Cardiovascular;  Laterality: N/A;  . Cardiac catheterization N/A 09/05/2015    Procedure: IABP Insertion;  Surgeon: Jolaine Artist, MD;  Location: [facility];  Service: Cardiovascular;  Laterality: N/A;  . Insertion of implantable left ventricular assist device N/A 08/30/2015    Procedure: INSERTION OF IMPLANTABLE LEFT VENTRICULAR ASSIST DEVICE;  Surgeon: Ivin Poot, MD;  Location: [facility];  Service: Open Heart Surgery;  Laterality: N/A;  CIRC ARREST  NITRIC OXIDE  . Tee without cardioversion N/A 09/25/2015    Procedure: TRANSESOPHAGEAL ECHOCARDIOGRAM (TEE);  Surgeon: Ivin Poot, MD;  Location: [facility];  Service: Open Heart Surgery;  Laterality: N/A;    Allergies: No Known Allergies  Medications:   Prior to Admission medications   Medication Sig Start Date End Date Taking? Authorizing Provider  atorvastatin (LIPITOR) 40 MG tablet Take 40 mg by mouth daily. 06/23/15  Yes Historical Provider, MD  bumetanide (BUMEX) 1 MG tablet Take 1 mg by mouth daily. 08/11/15  Yes Historical Provider, MD  carvedilol (COREG) 12.5 MG tablet Take 12.5 mg by mouth 2 (two) times daily. 08/25/15  Yes Historical Provider, MD  digoxin (LANOXIN) 0.125 MG tablet Take 125 mcg by mouth daily. 08/30/15  Yes Historical Provider, MD  dutasteride (AVODART) 0.5 MG capsule Take 0.5 mg by mouth daily. 07/07/15  Yes Historical Provider, MD  ELIQUIS 5 MG TABS tablet Take 5 mg by mouth 2 (two) times daily. 07/12/15  Yes Historical Provider, MD  ENTRESTO 49-51 MG Take 1 tablet by mouth 2 (two) times daily. 08/25/15  Yes Historical Provider, MD  tamsulosin (FLOMAX) 0.4 MG CAPS capsule Take 0.4 mg by mouth daily. 07/31/15  Yes Historical  Provider, MD    Discontinued Meds:   Medications Discontinued During This Encounter  Medication Reason  . lidocaine (  XYLOCAINE) 2 % viscous mouth solution 15 mL   . dutasteride (AVODART) capsule 0.5 mg   . furosemide (LASIX) injection 40 mg   . sildenafil (REVATIO) tablet 20 mg   . sulfamethoxazole-trimethoprim (BACTRIM,SEPTRA) 400-80 MG per tablet 1 tablet   . heparin ADULT infusion 100 units/mL (25000 units/250 mL)   . norepinephrine (LEVOPHED) 4 mg in dextrose 5 % 250 mL (0.016 mg/mL) infusion   . insulin aspart (novoLOG) injection 9 Units   . insulin aspart (novoLOG) injection 0-9 Units   . iohexol (OMNIPAQUE) 350 MG/ML injection Patient Discharge  . heparin 1,000 mL, lidocaine (PF) (XYLOCAINE) 1 % 20 mL Patient Discharge  . heparin injection Patient Discharge  . DOPamine (INTROPIN) 800 mg in dextrose 5 % 250 mL (3.2 mg/mL) infusion   . vancomycin (VANCOCIN) IVPB 1000 mg/200 mL premix   . vancomycin (VANCOCIN) IVPB 750 mg/150 ml premix   . hydrALAZINE (APRESOLINE) tablet 25 mg   . vancomycin (VANCOCIN) IVPB 1000 mg/200 mL premix   . mupirocin ointment (BACTROBAN) 2 % 1 application Patient Transfer  . temazepam (RESTORIL) capsule 15 mg Patient Transfer  . diazepam (VALIUM) tablet 5-10 mg Patient Transfer  . rifampin (RIFADIN) 600 mg in sodium chloride 0.9 % 100 mL IVPB Patient Transfer  . dexmedetomidine (PRECEDEX) 400 MCG/100ML (4 mcg/mL) infusion Patient Transfer  . insulin regular (NOVOLIN R,HUMULIN R) 250 Units in sodium chloride 0.9 % 250 mL (1 Units/mL) infusion Patient Transfer  . EPINEPHrine (ADRENALIN) 4 mg in dextrose 5 % 250 mL (0.016 mg/mL) infusion Patient Transfer  . DOBUTamine (DOBUTREX) infusion 4000 mcg/mL Patient Transfer  . DOPamine (INTROPIN) 800 mg in dextrose 5 % 250 mL (3.2 mg/mL) infusion Patient Transfer  . milrinone (PRIMACOR) 20 MG/100ML (0.2 mg/mL) infusion Patient Transfer  . norepinephrine (LEVOPHED) 4 mg in dextrose 5 % 250 mL (0.016 mg/mL) infusion  Patient Transfer  . nitroGLYCERIN 50 mg in dextrose 5 % 250 mL (0.2 mg/mL) infusion Patient Transfer  . phenylephrine (NEO-SYNEPHRINE) 20 mg in dextrose 5 % 250 mL (0.08 mg/mL) infusion Patient Transfer  . vasopressin (PITRESSIN) 40 Units in sodium chloride 0.9 % 250 mL (0.16 Units/mL) infusion Patient Transfer  . heparin 30,000 units/NS 1000 mL solution for CELLSAVER Patient Transfer  . potassium chloride injection 80 mEq Patient Transfer  . magnesium sulfate (IV Push/IM) injection 40 mEq Patient Transfer  . aminocaproic acid (AMICAR) 10 g in sodium chloride 0.9 % 100 mL infusion Patient Transfer  . vancomycin (VANCOCIN) 1,250 mg in sodium chloride 0.9 % 250 mL IVPB Patient Transfer  . cefUROXime (ZINACEF) 1.5 g in dextrose 5 % 50 mL IVPB Patient Transfer  . cefUROXime (ZINACEF) 750 mg in dextrose 5 % 50 mL IVPB Patient Transfer  . fluconazole (DIFLUCAN) IVPB 400 mg Patient Transfer  . rifampin (RIFADIN) 600 mg in sodium chloride 0.9 % 100 mL IVPB Patient Transfer  . sodium chloride irrigation 0.9 % Patient Transfer  . Surgifoam 1 Gm with 0.9% sodium chloride (4 ml) topical solution Patient Transfer  . hemostatic agents Patient Transfer  . hemostatic agents Patient Discharge  . 0.9 %  sodium chloride infusion Patient Discharge  . vancomycin (VANCOCIN) powder Patient Discharge  . 0.9 % irrigation (POUR BTL) Patient Discharge  . 0.9 %  sodium chloride infusion Patient Transfer  . 0.9 %  sodium chloride infusion Patient Transfer  . 0.9 %  sodium chloride infusion   . 0.9 %  sodium chloride infusion Patient Transfer  . 0.9 %  sodium chloride  infusion Patient Transfer  . acetaminophen (TYLENOL) tablet 650 mg Patient Transfer  . acetaminophen (TYLENOL) tablet 650 mg Patient Transfer  . amiodarone (PACERONE) tablet 200 mg Patient Transfer  . atorvastatin (LIPITOR) tablet 40 mg Patient Transfer  . heparin ADULT infusion 100 units/mL (25000 units/250 mL) Patient Transfer  . hydrALAZINE  (APRESOLINE) injection 10 mg Patient Transfer  . hydrALAZINE (APRESOLINE) tablet 50 mg Patient Transfer  . insulin aspart (novoLOG) injection 0-9 Units Patient Transfer  . insulin aspart (novoLOG) injection 0-5 Units Patient Transfer  . milrinone (PRIMACOR) 20 MG/100ML (0.2 mg/mL) infusion Patient Transfer  . norepinephrine (LEVOPHED) 16 mg in dextrose 5 % 250 mL (0.064 mg/mL) infusion Patient Transfer  . ondansetron (ZOFRAN) injection 4 mg Patient Transfer  . ondansetron (ZOFRAN) injection 4 mg Patient Transfer  . 0.9 %  sodium chloride infusion Patient Transfer  . sildenafil (REVATIO) tablet 20 mg Patient Transfer  . sodium chloride 0.9 % injection 3 mL Patient Transfer  . sodium chloride 0.9 % injection 3 mL Patient Transfer  . sodium chloride 0.9 % injection 10-40 mL Patient Transfer  . sodium chloride 0.9 % injection 10-40 mL Patient Transfer  . sodium chloride 0.9 % injection 3 mL Patient Transfer  . sodium chloride 0.9 % injection 3 mL Patient Transfer  . sodium chloride 0.9 % injection 3 mL Patient Transfer  . sodium chloride 0.9 % injection 3 mL Patient Transfer  . vancomycin (VANCOCIN) IVPB 1000 mg/200 mL premix Patient Transfer  . insulin regular bolus via infusion 0-10 Units   . insulin regular (NOVOLIN R,HUMULIN R) 250 Units in sodium chloride 0.9 % 250 mL (1 Units/mL) infusion   . vancomycin (VANCOCIN) IVPB 1000 mg/200 mL premix   . vasopressin (PITRESSIN) 40 Units in sodium chloride 0.9 % 250 mL (0.16 Units/mL) infusion   . phenylephrine (NEO-SYNEPHRINE) 20 mg in dextrose 5 % 250 mL (0.08 mg/mL) infusion   . lactated ringers infusion 500 mL   . chlorhexidine gluconate (PERIDEX) 0.12 % solution 15 mL   . vancomycin (VANCOCIN) IVPB 1000 mg/200 mL premix   . morphine 2 MG/ML injection 2-5 mg Dose change  . warfarin (COUMADIN) tablet 2.5 mg   . insulin aspart (novoLOG) injection 0-24 Units   . furosemide (LASIX) injection 20 mg   . nitroGLYCERIN 50 mg in dextrose 5 % 250 mL  (0.2 mg/mL) infusion   . furosemide (LASIX) injection 40 mg   . metoCLOPramide (REGLAN) injection 10 mg   . dexmedetomidine (PRECEDEX) 400 MCG/100ML (4 mcg/mL) infusion   . midazolam (VERSED) injection 2 mg   . warfarin (COUMADIN) tablet 2 mg   . antiseptic oral rinse solution (CORINZ) Completed Course  . sodium chloride 0.9 % injection 3 mL Duplicate  . sildenafil (REVATIO) tablet 20 mg   . potassium chloride 10 mEq in 50 mL *CENTRAL LINE* IVPB   . vancomycin (VANCOCIN) IVPB 1000 mg/200 mL premix   . insulin aspart (novoLOG) injection 0-24 Units   . amiodarone (NEXTERONE PREMIX) 360 MG/200ML (1.8 mg/mL) IV infusion   . antiseptic oral rinse (CPC / CETYLPYRIDINIUM CHLORIDE 0.05%) solution 7 mL   . furosemide (LASIX) injection 40 mg   . traMADol (ULTRAM) tablet 50-100 mg   . norepinephrine (LEVOPHED) 4 mg in dextrose 5 % 250 mL (0.016 mg/mL) infusion   . aspirin chewable tablet 324 mg   . aspirin suppository 300 mg   . feeding supplement (ENSURE ENLIVE) (ENSURE ENLIVE) liquid 237 mL   . piperacillin-tazobactam (ZOSYN) IVPB 3.375 g  Inpatient Standard  . furosemide (LASIX) injection 40 mg   . warfarin (COUMADIN) tablet 4 mg   . aspirin EC tablet 325 mg   . 0.9 %  sodium chloride infusion   . warfarin (COUMADIN) tablet 1 mg   . metoCLOPramide (REGLAN) injection 5 mg   . bisacodyl (DULCOLAX) EC tablet 10 mg   . bisacodyl (DULCOLAX) suppository 10 mg   . hydrALAZINE (APRESOLINE) tablet 10 mg   . hydrALAZINE (APRESOLINE) tablet 25 mg Dose change  . piperacillin-tazobactam (ZOSYN) IVPB 2.25 g   . hydrALAZINE (APRESOLINE) tablet 10 mg   . fluconazole (DIFLUCAN) IVPB 160 mg Duplicate  . feeding supplement (VITAL 1.5 CAL) liquid 1,000 mL   . feeding supplement (VITAL 1.5 CAL) liquid 1,000 mL   . furosemide (LASIX) injection 40 mg   . fluconazole (DIFLUCAN) IVPB 100 mg   . warfarin (COUMADIN) tablet 2 mg   . norepinephrine (LEVOPHED) 4 mg in dextrose 5 % 250 mL (0.016 mg/mL) infusion   .  fluconazole (DIFLUCAN) IVPB 200 mg   . milrinone (PRIMACOR) 20 MG/100ML (0.2 mg/mL) infusion   . furosemide (LASIX) injection 40 mg   . warfarin (COUMADIN) tablet 1 mg   . milrinone (PRIMACOR) 20 MG/100ML (0.2 mg/mL) infusion   . vancomycin (VANCOCIN) IVPB 750 mg/150 ml premix   . warfarin (COUMADIN) tablet 2.5 mg   . metoCLOPramide (REGLAN) injection 5 mg   . imipenem-cilastatin (PRIMAXIN) 250 mg in sodium chloride 0.9 % 100 mL IVPB Change in therapy  . dextrose 5 % and 0.45 % NaCl with KCl 20 mEq/L infusion   . hydrALAZINE (APRESOLINE) tablet 25 mg   . potassium chloride 10 MEQ/50ML IVPB Returned to ADS  . hydrALAZINE (APRESOLINE) tablet 50 mg   . torsemide (DEMADEX) tablet 20 mg   . warfarin (COUMADIN) tablet 4 mg   . dextrose 5 % 1,000 mL with potassium chloride 40 mEq infusion   . EPINEPHrine (ADRENALIN) 4 mg in dextrose 5 % 250 mL (0.016 mg/mL) infusion   . feeding supplement (ENSURE ENLIVE) (ENSURE ENLIVE) liquid 237 mL   . feeding supplement (VITAL 1.5 CAL) liquid 1,000 mL   . sulfamethoxazole-trimethoprim (BACTRIM) 320 mg in dextrose 5 % 500 mL IVPB   . milrinone (PRIMACOR) 20 MG/100ML (0.2 mg/mL) infusion   . warfarin (COUMADIN) tablet 3 mg   . hydrALAZINE (APRESOLINE) tablet 75 mg   . hydrALAZINE (APRESOLINE) tablet 25 mg   . lidocaine (PF) (XYLOCAINE) 1 % injection Returned to ADS  . insulin glargine (LANTUS) injection 14 Units   . tamsulosin (FLOMAX) capsule 0.4 mg   . piperacillin-tazobactam (ZOSYN) IVPB 3.375 g Formulary change  . piperacillin-tazobactam (ZOSYN) IVPB 2.25 g   . DOPamine (INTROPIN) 800 mg in dextrose 5 % 250 mL (3.2 mg/mL) infusion   . piperacillin-tazobactam (ZOSYN) IVPB 2.25 g   . feeding supplement (OSMOLITE 1.5 CAL) liquid 1,000 mL   . feeding supplement (OSMOLITE 1.5 CAL) liquid 1,000 mL   . warfarin (COUMADIN) tablet 1 mg     Social History:  reports that he has never smoked. He does not have any smokeless tobacco history on file. He reports  that he does not drink alcohol or use illicit drugs.  Family History:   Family History  Problem Relation Age of Onset  . Hypertension Mother   . CVA Maternal Aunt     Blood pressure 83/60, pulse 90, temperature 98.6 F (37 C), temperature source Oral, resp. rate 27, height _0  (1.702 m), weight  64.093 kg (141 lb 4.8 oz), SpO2 95 %. General appearance: alert and appears stated age Head: Normocephalic, without obvious abnormality, atraumatic Eyes: negative Neck: no adenopathy, no carotid bruit, supple, symmetrical, trachea midline and thyroid not enlarged, symmetric, no tenderness/mass/nodules Back: symmetric, no curvature. ROM normal. No CVA tenderness. Resp: rales bibasilar Chest wall: left sided chest wall tenderness Cardio: RRR GI: soft, non-tender; bowel sounds normal; no masses,  no organomegaly Extremities: edema 2+ Pulses: 2+ and symmetric Skin: Skin color, texture, turgor normal. No rashes or lesions Lymph nodes: Cervical adenopathy: left Neurologic: Grossly normal  Seen on CRRT at 9pm. Patient started on  CRRT @ 4pm and tolerating net UF of 15m/hr. Levophed requirements have actually decreased by 50% since initiating CRRT and the patient does not have any complaints. Pre/post/hd 500/300/1500 thru ROsceolatemp cath. No change to regimen. Will reassess in the AM and determine if we can increase net UF. I do not want to be too aggressive as his BP may drop and we will end up giving him fluids.     LDwana Melena MD 10/04/2015, 11:42 AM

## 2015-10-09 NOTE — Progress Notes (Signed)
LVAD Coordinator Advanced Heart Failure Rounds:  HM II LVAD Implanted 09/20/2015 as DT by Dr. Donata Clay.   Up in the chair today. TF going continuously now. Did not eat much off breakfast tray this morning and had to push for him to finish his boost. Respiratory status much improved after left CT placement by Dr. Donata Clay.   Vital signs: Temp: 98.4 - 101.5 HR: 90s A sensed, V paced  ICD SJM CRT-D (defib therapy off, pacing on) Doppler BP: 60 - 84 O2 Sat: 93 - 98% Venturi mask 15 L/min ---> 3.5 L/[place] (after CT placement)  Wt:142.6 lbs (preop) > 161...Marland KitchenMarland Kitchen 142 > 139 > 135 > 134 > 136 > 134 > 130 > 130 > 130 > no wt recorded > 143   LVAD interrogation reveals:  Speed: 9000 Flow: 9.4 Power: 7.4 PI: 3.9  Alarms: none Events: 9 events yesterday and 2 today Fixed speed: 9000 Low speed limit: 8400  Back up controller programmed accordingly and is at the bedside. LVAD is functioning within expected parameters. No programming changes were made this morning. No further power elevations > 10w noted since 1/9 with reviewing interrogation. New baseline power appears to be 6.9 - 8.2w.    Labs:  LDH trend: 372 >... > 401 > 522 > 383 (rechecked from 522) > 371  INR trend: 1.21 > ...  > 3.34 > 2.68 > 2.91  Hgb: 11.6 > 10.4 > 10.1 > 10.6 > 11.0 > 10.6 > 10.1 > 10.5 > 8.3 > 9.0  > 8.9 > 8.1  WBC:  8.9 > ... > 16.4 > 14.7 > 18.4 > 17.5 > 10.9 > 10.3 > 11.5  Co-Ox: 83% Creatinine: 1.6 (preop) > ... > 3.20 > ... > 2.37 > 3.0 > 3.3 > 3.75 > 3.85 > 4.22  Antithrombotic Management: 09/24/15--> 325 mg ASA   81 mg now 09/24/15--> Warfarin started  Infusions: Milrinone 0.25 Epi off 09/27/15; re-started 10/08/15 at 4 mcg Levo off 09/27/15; re-started 10/08/15 at 4 mcg  Dopamine off 10/08/15  OR Blood Products: 4 u FFP 2 u Platelets Cell saver  ICU Blood Products:  09/22/2015--> 2 FFP, 2 pRBC (Hgb 7) 09/24/15 - 2 units pRBC 10/01/15 --> 2 u PRBC 10/07/15 --> 1 uPRBC and 1 FFP 10/01/2015--> 1 uPRBC    Ventilator: Extubated 09/25/15  NO weaned off. On Sildenafil 40 mg TID for PAH   Infection: 1/2 BCs + for candida 09/29/2015  anidulafungin (ERAXIS) 100 mg QD 10/01/15 >> current  imipenem-cilastatin (Primaxin) 250 mg Q8H 09/29/15 - 10/03/15  Sputum >> stenotrophamonas on 09/30/2015  Levofloxacin 10/06/15 >> current  Septra 10/04/15 >> 10/06/15  Levoquin 500 mg q 48 hrs 10/06/15 >> current Zosyn 2.25 mg q 6 hrs 10/07/15 -10/08/15 Vancomycin 1000 mg q 48 hrs 10/07/15 >> current Maxipime 1 gm q 24 hrs 10/08/15 >> current  Respiratory Events: CT placed for effusion. CT placed for recurrent effusion 10/08/15    Plan as discussed with team: 1. MAPs better but creatinine up. Nephrology to be consulted today.   2. Unable to have PT/OT sessions with current vasoactive infusions. Up to chair as much as possible to facilitate   3. Procalcitonin elevating since 10/07/15--> antibiotics broadened and re-cultured yesterday.   4. RDW increasing with progressive anemia. Iron infusion?  3. Some power elevations noted on interrogation as above. Continue to monitor. No turbulence to pump with auscultation. Urine dark but yellow.   4. With PI's dropping <4 occasionally would recommend to doppler  BP first then follow up with automatic BP cuff. When pulsatility is low the automatic BP cuff is not the most reliable method of measurement.   5. D/C teaching will be scheduled when patient is more stable. Planning disposition at D/C to CIR.  Rexene Alberts, RN, BSN, CCRN VAD Coordinator   Office: [phone] 24/7 VAD Pager: [phone]

## 2015-10-09 NOTE — Progress Notes (Signed)
Patient ID: Koehn Salehi, male   DOB: 1941-01-19, 75 y.o.   MRN: [id number]         [facility]    Date of Admission:  09/13/2015   Total days of antibiotics 23        Day 9 anidulafungin        Day 4 levofloxacin        Day 2 vancomycin        Day 2 cefepime         Principal Problem:   Fungemia Active Problems:   Acute on chronic systolic and diastolic heart failure, NYHA class 4 (HCC)   LVAD (left ventricular assist device) present (HCC)   Automatic implantable cardioverter-defibrillator in situ   Cardiogenic shock (HCC)   Acute renal failure superimposed on stage 3 chronic kidney disease (HCC)   PAF (paroxysmal atrial fibrillation) (HCC)   PAH (pulmonary artery hypertension) (HCC)   Dyspnea   Hypotension   Palliative care encounter   CHF (congestive heart failure) (HCC)   AKI (acute kidney injury) (HCC)   HCAP (healthcare-associated pneumonia)   Renal failure (ARF), acute on chronic (HCC)   Septic shock (HCC)   . amiodarone  200 mg Oral Daily  . anidulafungin  100 mg Intravenous Q24H  . aspirin  81 mg Oral Daily  . ceFEPime (MAXIPIME) IV  2 g Intravenous Q12H  . docusate sodium  200 mg Oral Daily  . feeding supplement  237 mL Oral TID WC  . furosemide  40 mg Intravenous Once  . insulin aspart  0-5 Units Subcutaneous QHS  . insulin aspart  0-9 Units Subcutaneous TID WC  . insulin glargine  14 Units Subcutaneous BID  . lactobacillus acidophilus & bulgar  1 tablet Oral BID  . levofloxacin  250 mg Oral q1800  . pantoprazole  40 mg Oral Daily  . sildenafil  40 mg Oral TID  . sodium chloride  10 mL Intravenous Q12H  . sodium chloride  10-40 mL Intracatheter Q12H  . vancomycin  750 mg Intravenous Q24H  . Warfarin - Physician Dosing Inpatient   Does not apply q1800    SUBJECTIVE: He states that he is feeling a little better today. He has not had any chest pain. He has minimal, nonproductive cough.  Review of Systems: Review of Systems    Constitutional: Positive for fever. Negative for chills and diaphoresis.  Respiratory: Positive for cough. Negative for sputum production and shortness of breath.   Cardiovascular: Negative for chest pain.  Gastrointestinal: Positive for diarrhea.    Past Medical History  Diagnosis Date  . 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 CKD (chronic kidney disease) stage 3, GFR 30-59 ml/min   . V-tach (HCC)   . PAF (paroxysmal atrial fibrillation) (HCC)   . CVA (cerebral infarction)   . BPH (benign prostatic hyperplasia)   . HTN (hypertension), benign   . Hyperlipidemia     Social History  Substance Use Topics  . Smoking status: Never Smoker   . Smokeless tobacco: None  . Alcohol Use: No    Family History  Problem Relation Age of Onset  . Hypertension Mother   . CVA Maternal Aunt    No Known Allergies  OBJECTIVE: Filed Vitals:   10-14-15 1045 14-Oct-2015 1130 2015-10-14 1224 14-Oct-2015 1500  Pulse: 91 90    Temp: 98.9 F (37.2 C) 98.6 F (37 C) 98.6 F (37 C)  98 F (36.7 C)  TempSrc: Oral Oral Oral Axillary  Resp:  27    Height:      Weight:      SpO2: 94% 95%     Body mass index is 22.13 kg/(m^2).  Physical Exam  Constitutional: No distress.  He actually looks better today. He is alert and in no distress.  Cardiovascular: Normal rate and regular rhythm.   No murmur heard. Pulmonary/Chest:  AICD and LVAD sites appear normal. Diminished breath sounds on left.   Abdominal: Soft. There is no tenderness.  Genitourinary:  Blood-tinged urine in Foley bag.  Skin:  Central line site looks okay.    Lab Results Lab Results  Component Value Date   WBC 13.0* 10/25/2015   HGB 10.5* 10/13/2015   HCT 29.7* 09/29/2015   MCV 83.4 10/23/2015   PLT 933* 09/27/2015    Lab Results  Component Value Date   CREATININE 3.78* 10/26/2015   BUN 74* 10/18/2015   NA 141 10/23/2015   K 4.7 10/21/2015   CL 106 10/13/2015   CO2 21* 10/10/2015     Lab Results  Component Value Date   ALT 23 10/08/2015   AST 52* 10/17/2015   ALKPHOS 122 10/19/2015   BILITOT 0.5 10/06/2015     Microbiology: Recent Results (from the past 240 hour(s))  Culture, respiratory (NON-Expectorated)     Status: None   Collection Time: 09/30/15  9:27 AM  Result Value Ref Range Status   Specimen Description INDUCED SPUTUM  Final   Special Requests Normal  Final   Gram Stain   Final    MODERATE WBC PRESENT, PREDOMINANTLY PMN MODERATE SQUAMOUS EPITHELIAL CELLS PRESENT RARE GRAM POSITIVE COCCI IN PAIRS RARE YEAST RARE GRAM NEGATIVE RODS    Culture   Final    FEW STENOTROPHOMONAS MALTOPHILIA Performed at [employer] [facility]    Report Status 10/03/2015 FINAL  Final   Organism ID, Bacteria STENOTROPHOMONAS MALTOPHILIA  Final      Susceptibility   Stenotrophomonas maltophilia - MIC*    TRIMETH/SULFA Value in next row Sensitive      <=20 SENSITIVE(NOTE)    LEVOFLOXACIN Value in next row Sensitive      <=20 SENSITIVE(NOTE)    * FEW STENOTROPHOMONAS MALTOPHILIA  Culture, body fluid-bottle     Status: None   Collection Time: 09/30/15 10:21 AM  Result Value Ref Range Status   Specimen Description FLUID PLEURAL LEFT  Final   Special Requests NONE  Final   Culture NO GROWTH 5 DAYS  Final   Report Status 10/05/2015 FINAL  Final  Gram stain     Status: None   Collection Time: 09/30/15 10:21 AM  Result Value Ref Range Status   Specimen Description FLUID PLEURAL LEFT  Final   Special Requests NONE  Final   Gram Stain   Final    MODERATE WBC PRESENT,BOTH PMN AND MONONUCLEAR NO ORGANISMS SEEN    Report Status 09/30/2015 FINAL  Final  Culture, blood (routine x 2)     Status: None   Collection Time: 10/01/15  9:55 AM  Result Value Ref Range Status   Specimen Description BLOOD LEFT WRIST  Final   Special Requests BOTTLES DRAWN AEROBIC AND ANAEROBIC 5CC  Final   Culture NO GROWTH 5 DAYS  Final   Report Status 10/06/2015 FINAL  Final  Culture, blood  (routine x 2)     Status: None   Collection Time: 10/01/15 10:03 AM  Result Value Ref Range Status  Specimen Description BLOOD LEFT HAND  Final   Special Requests IN PEDIATRIC BOTTLE 3CC  Final   Culture NO GROWTH 5 DAYS  Final   Report Status 10/06/2015 FINAL  Final  Culture, blood (single)     Status: None (Preliminary result)   Collection Time: 10/05/15 10:32 AM  Result Value Ref Range Status   Specimen Description BLOOD CENTRAL LINE  Final   Special Requests BOTTLES DRAWN AEROBIC AND ANAEROBIC 5CC  Final   Culture NO GROWTH 4 DAYS  Final   Report Status PENDING  Incomplete  Culture, Urine     Status: None   Collection Time: 10/06/15  4:30 PM  Result Value Ref Range Status   Specimen Description URINE, CLEAN CATCH  Final   Special Requests Levaquin, anidulafungin  Final   Culture NO GROWTH 1 DAY  Final   Report Status 10/07/2015 FINAL  Final  Urine culture     Status: None   Collection Time: 10/07/15  4:25 PM  Result Value Ref Range Status   Specimen Description URINE, CATHETERIZED  Final   Special Requests NONE  Final   Culture NO GROWTH 1 DAY  Final   Report Status 10/08/2015 FINAL  Final  Body fluid culture     Status: None (Preliminary result)   Collection Time: 10/08/15  9:20 AM  Result Value Ref Range Status   Specimen Description FLUID LEFT PLEURAL  Final   Special Requests NONE  Final   Gram Stain   Final    ABUNDANT WBC PRESENT,BOTH PMN AND MONONUCLEAR NO ORGANISMS SEEN    Culture NO GROWTH < 24 HOURS  Final   Report Status PENDING  Incomplete  Culture, blood (routine x 2)     Status: None (Preliminary result)   Collection Time: 10/08/15 10:20 AM  Result Value Ref Range Status   Specimen Description BLOOD RIGHT HAND  Final   Special Requests BOTTLES DRAWN AEROBIC AND ANAEROBIC 10CC  Final   Culture NO GROWTH 1 DAY  Final   Report Status PENDING  Incomplete  Culture, blood (routine x 2)     Status: None (Preliminary result)   Collection Time: 10/08/15 10:31  AM  Result Value Ref Range Status   Specimen Description BLOOD LEFT HAND  Final   Special Requests BOTTLES DRAWN AEROBIC AND ANAEROBIC 10CC  Final   Culture NO GROWTH 1 DAY  Final   Report Status PENDING  Incomplete  Culture, Urine     Status: None   Collection Time: 10/08/15 12:10 PM  Result Value Ref Range Status   Specimen Description URINE, CATHETERIZED  Final   Special Requests Immunocompromised  Final   Culture NO GROWTH 1 DAY  Final   Report Status 10/06/2015 FINAL  Final     ASSESSMENT: He remains on therapy for fungemia and stenotrophomonas pneumonia. She is also on broader empiric antibiotic therapy because of recent fevers. So far repeat blood cultures, urine cultures and pleural fluid cultures are negative. He has developed some diarrhea and is at high risk for C. difficile colitis.  PLAN: 1. Continue broad antibiotic therapy pending culture results and C. difficile screen   [name], MD [facility] [phone] pager   [phone] cell 10/13/2015, 4:07 PM

## 2015-10-09 NOTE — Progress Notes (Signed)
Rehab Admissions - Following along for potential inpatient rehab admission.  Not medically ready at this time.  Call me for questions.  #[phone]

## 2015-10-09 NOTE — Care Management Important Message (Signed)
Important Message  Patient Details  Name: Brad [name] MRN: [id number] Date of Birth: 03-Dec-1940   Medicare Important Message Given:  Yes    [name] 10-20-15, 11:14 AM

## 2015-10-09 NOTE — CV Procedure (Addendum)
Central line HD catheter insertion Site - Right  IJ Sterile prep and drape, CXR pending No complications Indications - acute on chronic renal failure Surgeon - Donata Clay

## 2015-10-10 ENCOUNTER — Inpatient Hospital Stay (HOSPITAL_COMMUNITY): Payer: Medicare Other

## 2015-10-10 ENCOUNTER — Inpatient Hospital Stay (HOSPITAL_COMMUNITY): Payer: Medicare Other | Admitting: Certified Registered"

## 2015-10-10 DIAGNOSIS — Z9911 Dependence on respirator [ventilator] status: Secondary | ICD-10-CM

## 2015-10-10 DIAGNOSIS — J9601 Acute respiratory failure with hypoxia: Secondary | ICD-10-CM

## 2015-10-10 LAB — GLUCOSE, CAPILLARY
GLUCOSE-CAPILLARY: 35 mg/dL — AB (ref 65–99)
GLUCOSE-CAPILLARY: 69 mg/dL (ref 65–99)
GLUCOSE-CAPILLARY: 131 mg/dL — AB (ref 65–99)
GLUCOSE-CAPILLARY: 42 mg/dL — AB (ref 65–99)
Glucose-Capillary: 33 mg/dL — CL (ref 65–99)
Glucose-Capillary: 69 mg/dL (ref 65–99)
Glucose-Capillary: 47 mg/dL — ABNORMAL LOW (ref 65–99)
Glucose-Capillary: 109 mg/dL — ABNORMAL HIGH (ref 65–99)
Glucose-Capillary: 102 mg/dL — ABNORMAL HIGH (ref 65–99)
Glucose-Capillary: 113 mg/dL — ABNORMAL HIGH (ref 65–99)
Glucose-Capillary: 114 mg/dL — ABNORMAL HIGH (ref 65–99)

## 2015-10-10 LAB — POCT I-STAT 3, ART BLOOD GAS (G3+)
ACID-BASE EXCESS: 2 mmol/L (ref 0.0–2.0)
Acid-base deficit: 2 mmol/L (ref 0.0–2.0)
BICARBONATE: 23.9 meq/L (ref 20.0–24.0)
BICARBONATE: 26.4 meq/L — AB (ref 20.0–24.0)
O2 SAT: 100 %
O2 Saturation: 99 %
PH ART: 7.323 — AB (ref 7.350–7.450)
PH ART: 7.418 (ref 7.350–7.450)
PO2 ART: 165 mmHg — AB (ref 80.0–100.0)
PO2 ART: 191 mmHg — AB (ref 80.0–100.0)
Patient temperature: 98.8
Patient temperature: 98.9
TCO2: 25 mmol/L (ref 0–100)
TCO2: 28 mmol/L (ref 0–100)
pCO2 arterial: 46.1 mmHg — ABNORMAL HIGH (ref 35.0–45.0)
pCO2 arterial: 40.9 mmHg (ref 35.0–45.0)

## 2015-10-10 LAB — RENAL FUNCTION PANEL
ALBUMIN: 3 g/dL — AB (ref 3.5–5.0)
ANION GAP: 8 (ref 5–15)
BUN: 34 mg/dL — ABNORMAL HIGH (ref 6–20)
CO2: 24 mmol/L (ref 22–32)
Calcium: 8.7 mg/dL — ABNORMAL LOW (ref 8.9–10.3)
Chloride: 105 mmol/L (ref 101–111)
Creatinine, Ser: 1.73 mg/dL — ABNORMAL HIGH (ref 0.61–1.24)
GFR, EST AFRICAN AMERICAN: 43 mL/min — AB (ref >60)
GFR, EST NON AFRICAN AMERICAN: 37 mL/min — AB (ref >60)
Glucose, Bld: 110 mg/dL — ABNORMAL HIGH (ref 65–99)
PHOSPHORUS: 2.3 mg/dL — AB (ref 2.5–4.6)
POTASSIUM: 4.5 mmol/L (ref 3.5–5.1)
Sodium: 137 mmol/L (ref 135–145)

## 2015-10-10 LAB — CBC
HCT: 27.8 % — ABNORMAL LOW (ref 39.0–52.0)
Hemoglobin: 9.5 g/dL — ABNORMAL LOW (ref 13.0–17.0)
MCH: 29.2 pg (ref 26.0–34.0)
MCHC: 34.2 g/dL (ref 30.0–36.0)
MCV: 85.5 fL (ref 78.0–100.0)
Platelets: 811 10*3/uL — ABNORMAL HIGH (ref 150–400)
RBC: 3.25 MIL/uL — ABNORMAL LOW (ref 4.22–5.81)
RDW: 17 % — ABNORMAL HIGH (ref 11.5–15.5)
WBC: 13.3 10*3/uL — ABNORMAL HIGH (ref 4.0–10.5)

## 2015-10-10 LAB — COMPREHENSIVE METABOLIC PANEL
ALT: 25 U/L (ref 17–63)
AST: 61 U/L — ABNORMAL HIGH (ref 15–41)
Albumin: 3.6 g/dL (ref 3.5–5.0)
Alkaline Phosphatase: 122 U/L (ref 38–126)
Anion gap: 7 (ref 5–15)
BUN: 49 mg/dL — ABNORMAL HIGH (ref 6–20)
CO2: 25 mmol/L (ref 22–32)
Calcium: 9.5 mg/dL (ref 8.9–10.3)
Chloride: 110 mmol/L (ref 101–111)
Creatinine, Ser: 2.53 mg/dL — ABNORMAL HIGH (ref 0.61–1.24)
GFR calc Af Amer: 27 mL/min — ABNORMAL LOW (ref >60)
GFR calc non Af Amer: 23 mL/min — ABNORMAL LOW (ref >60)
Glucose, Bld: <20 mg/dL — CL (ref 65–99)
Potassium: 4.7 mmol/L (ref 3.5–5.1)
Sodium: 142 mmol/L (ref 135–145)
Total Bilirubin: 1 mg/dL (ref 0.3–1.2)
Total Protein: 6.7 g/dL (ref 6.5–8.1)

## 2015-10-10 LAB — CULTURE, BLOOD (SINGLE): Culture: NO GROWTH

## 2015-10-10 LAB — TYPE AND SCREEN
ABO/RH(D): B POS
Antibody Screen: NEGATIVE
Unit division: 0
Unit division: 0

## 2015-10-10 LAB — LACTATE DEHYDROGENASE: LDH: 399 U/L — ABNORMAL HIGH (ref 98–192)

## 2015-10-10 LAB — GLUCOSE, RANDOM: Glucose, Bld: 24 mg/dL — CL (ref 65–99)

## 2015-10-10 LAB — CARBOXYHEMOGLOBIN
Carboxyhemoglobin: 1.4 % (ref 0.5–1.5)
Methemoglobin: 1.1 % (ref 0.0–1.5)
O2 Saturation: 67.5 %
Total hemoglobin: 9.6 g/dL — ABNORMAL LOW (ref 13.5–18.0)

## 2015-10-10 LAB — PROTIME-INR
INR: 2.54 — ABNORMAL HIGH (ref 0.00–1.49)
Prothrombin Time: 27 seconds — ABNORMAL HIGH (ref 11.6–15.2)

## 2015-10-10 LAB — MAGNESIUM: Magnesium: 2.3 mg/dL (ref 1.7–2.4)

## 2015-10-10 LAB — PHOSPHORUS: Phosphorus: 3.9 mg/dL (ref 2.5–4.6)

## 2015-10-10 MED ORDER — DEXTROSE 50 % IV SOLN
INTRAVENOUS | Status: AC
  Administered 2015-10-10: 50 mL via INTRAVENOUS
  Filled 2015-10-10: qty 50

## 2015-10-10 MED ORDER — SODIUM CHLORIDE 0.9 % IV SOLN: 250.0000 mL | INTRAVENOUS | Status: DC

## 2015-10-10 MED ORDER — FENTANYL CITRATE (PF) 100 MCG/2ML IJ SOLN
INTRAMUSCULAR | Status: AC
  Filled 2015-10-10: qty 2

## 2015-10-10 MED ORDER — DEXTROSE 50 % IV SOLN
50.0000 mL | Freq: Once | INTRAVENOUS | Status: AC
  Administered 2015-10-10: 50 mL via INTRAVENOUS

## 2015-10-10 MED ORDER — DEXTROSE 10 % IV SOLN
INTRAVENOUS | Status: DC
  Administered 2015-10-10: 50 mL/h via INTRAVENOUS

## 2015-10-10 MED ORDER — DEXTROSE 50 % IV SOLN
INTRAVENOUS | Status: AC
  Filled 2015-10-10: qty 50

## 2015-10-10 MED ORDER — DEXTROSE 50 % IV SOLN: 1.0000 | Freq: Once | INTRAVENOUS | Status: AC

## 2015-10-10 MED ORDER — CHLORHEXIDINE GLUCONATE 0.12% ORAL RINSE (MEDLINE KIT)
15.0000 mL | Freq: Two times a day (BID) | OROMUCOSAL | Status: DC
  Administered 2015-10-10 – 2015-10-12 (×4): 15 mL via OROMUCOSAL

## 2015-10-10 MED ORDER — PRO-STAT SUGAR FREE PO LIQD
30.0000 mL | Freq: Two times a day (BID) | ORAL | Status: DC
  Administered 2015-10-10 – 2015-10-12 (×4): 30 mL
  Administered 2015-10-12: 1 mL
  Administered 2015-10-13 – 2015-10-14 (×3): 30 mL
  Filled 2015-10-10 (×7): qty 30

## 2015-10-10 MED ORDER — WARFARIN SODIUM 2 MG PO TABS
2.0000 mg | ORAL_TABLET | Freq: Every day | ORAL | Status: DC
  Administered 2015-10-10 – 2015-10-11 (×2): 2 mg via ORAL
  Filled 2015-10-10 (×3): qty 1

## 2015-10-10 MED ORDER — PRISMASOL BGK 4/2.5 32-4-2.5 MEQ/L IV SOLN
INTRAVENOUS | Status: DC
  Administered 2015-10-11 – 2015-10-12 (×9): via INTRAVENOUS_CENTRAL
  Filled 2015-10-10 (×13): qty 5000

## 2015-10-10 MED ORDER — LEVOFLOXACIN IN D5W 250 MG/50ML IV SOLN
250.0000 mg | INTRAVENOUS | Status: DC
  Administered 2015-10-10 – 2015-10-11 (×2): 250 mg via INTRAVENOUS
  Filled 2015-10-10 (×3): qty 50

## 2015-10-10 MED ORDER — PANTOPRAZOLE SODIUM 40 MG PO PACK
40.0000 mg | PACK | Freq: Every day | ORAL | Status: DC
Start: 2015-10-10 — End: 2015-10-16
  Administered 2015-10-10 – 2015-10-15 (×7): 40 mg
  Filled 2015-10-10 (×6): qty 20

## 2015-10-10 MED ORDER — INSULIN ASPART 100 UNIT/ML ~~LOC~~ SOLN
0.0000 [IU] | SUBCUTANEOUS | Status: DC
  Administered 2015-10-10: 1 [IU] via SUBCUTANEOUS
  Administered 2015-10-11: 2 [IU] via SUBCUTANEOUS
  Administered 2015-10-11: 3 [IU] via SUBCUTANEOUS
  Administered 2015-10-11 – 2015-10-12 (×4): 1 [IU] via SUBCUTANEOUS
  Administered 2015-10-12: 3 [IU] via SUBCUTANEOUS
  Administered 2015-10-12: 1 [IU] via SUBCUTANEOUS
  Administered 2015-10-12: 2 [IU] via SUBCUTANEOUS
  Administered 2015-10-12 – 2015-10-14 (×3): 1 [IU] via SUBCUTANEOUS
  Administered 2015-10-14: 2 [IU] via SUBCUTANEOUS
  Administered 2015-10-14 – 2015-10-15 (×5): 1 [IU] via SUBCUTANEOUS
  Administered 2015-10-15: 2 [IU] via SUBCUTANEOUS

## 2015-10-10 MED ORDER — ANTISEPTIC ORAL RINSE SOLUTION (CORINZ)
7.0000 mL | Freq: Four times a day (QID) | OROMUCOSAL | Status: DC
  Administered 2015-10-10 – 2015-10-12 (×8): 7 mL via OROMUCOSAL

## 2015-10-10 MED ORDER — DEXTROSE 50 % IV SOLN
25.0000 mL | Freq: Once | INTRAVENOUS | Status: AC
  Administered 2015-10-10: 25 mL via INTRAVENOUS

## 2015-10-10 MED ORDER — CALCIUM CHLORIDE 10 % IV SOLN
1.0000 g | Freq: Once | INTRAVENOUS | Status: AC
  Administered 2015-10-10: 1 g via INTRAVENOUS
  Filled 2015-10-10: qty 10

## 2015-10-10 MED ORDER — ALBUMIN HUMAN 25 % IV SOLN
12.5000 g | Freq: Once | INTRAVENOUS | Status: AC
Start: 2015-10-10 — End: 2015-10-10
  Administered 2015-10-10: 12.5 g via INTRAVENOUS
  Filled 2015-10-10: qty 50

## 2015-10-10 MED ORDER — DEXTROSE 50 % IV SOLN
INTRAVENOUS | Status: AC
  Administered 2015-10-10: 50 mL
  Filled 2015-10-10: qty 50

## 2015-10-10 MED ORDER — POTASSIUM CHLORIDE 10 MEQ/50ML IV SOLN
INTRAVENOUS | Status: AC
  Filled 2015-10-10: qty 50

## 2015-10-10 MED ORDER — DEXTROSE 50 % IV SOLN
1.0000 | Freq: Once | INTRAVENOUS | Status: AC
  Administered 2015-10-10: 50 mL via INTRAVENOUS

## 2015-10-10 MED ORDER — DEXMEDETOMIDINE HCL IN NACL 200 MCG/50ML IV SOLN
0.2000 ug/kg/h | INTRAVENOUS | Status: DC
  Administered 2015-10-10: 0.2 ug/kg/h via INTRAVENOUS
  Administered 2015-10-10: 0.5 ug/kg/h via INTRAVENOUS
  Administered 2015-10-11 – 2015-10-12 (×3): 0.3 ug/kg/h via INTRAVENOUS
  Filled 2015-10-10 (×4): qty 50

## 2015-10-10 MED ORDER — FENTANYL CITRATE (PF) 100 MCG/2ML IJ SOLN
25.0000 ug | INTRAMUSCULAR | Status: DC | PRN
  Administered 2015-10-10 – 2015-10-15 (×6): 50 ug via INTRAVENOUS
  Administered 2015-10-15: 25 ug via INTRAVENOUS
  Administered 2015-10-15 – 2015-10-16 (×4): 50 ug via INTRAVENOUS
  Filled 2015-10-10 (×12): qty 2

## 2015-10-10 MED ORDER — SODIUM CHLORIDE 0.9 % IV SOLN
INTRAVENOUS | Status: DC | PRN
  Administered 2015-10-10: 10 mL/h via INTRA_ARTERIAL

## 2015-10-10 NOTE — Progress Notes (Signed)
CT surgery HeartMate 2 Rounding Note POD #17  Heartmate 2 implantation   Subjective:   Class 4 CHF with cardiogenic shock, hx nonischemic    Cardiomyopathy Preop IABP Hx BPH, TURP and bladder outlet obstruction required cystoscopy to place foley\ probable UTI preop Preop severe protein loss malnutrition, prealbumin < 8 Pre and Post-op acute on chronic renal failure- required CVVH for postop acute HTN Preop guaiac + stool Preop chronic Eliquis for a-fib Postop acute on chronic anemia Postop fungemia Postop L pleural effusion requiring chest tube x2-   Patient with clinical deterioration starting postop day 12 , Pro calcitonin marker positive with clinical signs of sepsis. Antibiotic coverage has been increased. Patient has been replaced on pressors . Cultures have been negative since isolated candida species positive blood culture  Repeat left chest tube 28 French placed  with drainage 1 L of serosanguineous pleural effusion-cultures negative so far. Creatinine continues to rise now 4.2, urine output decreased--renal consult placed-patient on CVVH via right IJ temporary dialysis catheter  Echocardiogram  shows good unloading of the LV but with persistent moderate RV dysfunction  Patient required emergency intubation last night for apnea and possible mucous plugging. Much more alert and responsive now on ventilator. Tracheal aspirate sent for culture. Chest x-ray with probable left lower lobe pneumonia-airspace disease. Infectious disease following the patient and providing antibiotic guidance.    Incisions clean, dry   INR now  2.5- coumadin 2 mg  continue aspirin 81 mg VAD parameters satisfactory w isolated PI events  Currently on continuous tube feeds for protein malnutrition  LVAD INTERROGATION:  HeartMate II LVAD:  Flow 8.3 liters/min, speed 9000 rpm, power 5.2, PI 4.2  Controller intact  Objective:    Vital Signs:   Temp:  [97.4 F (36.3 C)-99.9 F (37.7 C)] 97.8 F (36.6  C) (01/14 1234) Pulse Rate:  [31-101] 90 (01/14 1300) Resp:  [17-34] 20 (01/14 1300) BP: (92-127)/(69-89) 92/69 mmHg (01/14 1300) SpO2:  [90 %-100 %] 100 % (01/14 1300) Arterial Line BP: (67-127)/(62-82) 92/69 mmHg (01/14 1300) FiO2 (%):  [50 %-100 %] 50 % (01/14 1200) Last BM Date: 10/17/2015 Mean arterial Pressure 90-95  Intake/Output:   Intake/Output Summary (Last 24 hours) at 10/10/15 1425 Last data filed at 10/10/15 1400  Gross per 24 hour  Intake 2689.88 ml  Output   3478 ml  Net -788.12 ml     Physical Exam: General:  Appears weak and fragile. Currently intubated and responsive and more alert HEENT: normal Neck: supple. no JVP  No carotid pulses; no bruits. No lymphadenopathy or thryomegaly appreciated. Cor: Mechanical heart sounds with LVAD hum present. Lungs: clear. Improved breath sounds after placement of left chest tube. Left chest tube drainage now minimal Abdomen: soft, nontender, nondistended. No hepatosplenomegaly. No bruits or masses. Good bowel sounds. Extremities: no cyanosis, clubbing, rash, mild- mod extremity  edema Neuro: alert & orientedx3, cranial nerves grossly intact.             Generally weak with poor ambulation ability after              developing fungemia followed by generalized sepsis  Telemetry: paced rhythm 90  Labs: Basic Metabolic Panel:  Recent Labs Lab 10/08/15 0335 10/08/15 0530 10/08/15 1248 10/20/2015 0435 10/04/2015 1446 10/10/15 0413 10/10/15 0704  NA 143 143 142 142 141 142  --   K 6.7* 4.8 4.2 4.5 4.7 4.7  --   CL 111 111 108 109 106 110  --   CO2 24  23  --  22 21* 25  --   GLUCOSE 132* 135* 241* 117* 142* <20* 24*  BUN 63* 62* 67* 72* 74* 49*  --   CREATININE 3.75* 3.85* 3.70* 4.22* 3.78* 2.53*  --   CALCIUM 8.9 9.0  --  9.1 9.3 9.5  --   MG  --   --   --   --   --  2.3  --   PHOS  --   --   --   --  5.8* 3.9  --     Liver Function Tests:  Recent Labs Lab 10/06/2015 0435 10/27/2015 1446 10/10/15 0413  AST 52*  --   61*  ALT 23  --  25  ALKPHOS 122  --  122  BILITOT 0.5  --  1.0  PROT 6.0*  --  6.7  ALBUMIN 3.3* 3.6 3.6   No results for input(s): LIPASE, AMYLASE in the last 168 hours. No results for input(s): AMMONIA in the last 168 hours.  CBC:  Recent Labs Lab 10/07/15 1300  10/08/15 0335 10/08/15 1248 10/20/2015 0435 10/15/2015 1446 10/10/15 0413  WBC 10.0  --  10.3  --  11.5* 13.0* 13.3*  HGB 9.0*  < > 8.9* 9.5* 8.1* 10.5* 9.5*  HCT 26.2*  < > 26.7* 28.0* 24.0* 29.7* 27.8*  MCV 84.8  --  87.5  --  86.6 83.4 85.5  PLT 703*  --  718*  --  841* 933* 811*  < > = values in this interval not displayed.  INR:  Recent Labs Lab 10/06/15 0407 10/07/15 0502 10/08/15 0430 10/02/2015 0435 10/10/15 0413  INR 2.87* 3.34* 2.68* 2.91* 2.54*    Other results:  EKG:   Imaging: Dg Chest Port 1 View  10/10/2015  CLINICAL DATA:  Shortness of breath on exertion. Acute on chronic renal failure. Congestive heart failure. Atrial fibrillation. Pulmonary arterial hypertension. EXAM: PORTABLE CHEST 1 VIEW COMPARISON:  10/10/2015 FINDINGS: Support lines and tubes in appropriate position including left ventricular assist device and left chest tube. No pneumothorax visualized. Cardiomegaly stable. Bilateral lower lung airspace opacity and bilateral pleural effusions show no significant change. IMPRESSION: No significant change in cardiomegaly, bilateral lower lung airspace opacity and pleural effusions. No pneumothorax visualized. Electronically Signed   By: Myles Rosenthal M.D.   On: 10/10/2015 11:00   Dg Chest Port 1 View  10/10/2015  CLINICAL DATA:  Endotracheal tube placement. Shortness of breath. Initial encounter. EXAM: PORTABLE CHEST 1 VIEW COMPARISON:  Chest radiograph from 10/18/2015 FINDINGS: The patient's endotracheal tube is seen ending 1-2 cm above the carina. This should be retracted 1-2 cm. An enteric tube is noted extending below the diaphragm. A right IJ line is noted ending about the right atrium. A  left-sided chest tube is noted ending near the left lung apex. Small bilateral pleural effusions are seen. Patchy left-sided airspace opacification could reflect mild asymmetric interstitial edema. No pneumothorax is seen. The cardiomediastinal silhouette is enlarged. The patient is status post median sternotomy. A left ventricular assist device is noted. A pacemaker/AICD is noted overlying the left chest wall, with leads ending overlying the right atrium, right ventricle and coronary sinus. No acute osseous abnormalities are seen. IMPRESSION: 1. Endotracheal tube seen ending 1-2 cm above the carina. This should be retracted 1-2 cm, as deemed clinically appropriate. 2. Small bilateral pleural effusions noted. Patchy left-sided airspace opacification could reflect mild asymmetric interstitial edema. Cardiomegaly noted. Electronically Signed   By: Beryle Beams.D.  On: 10/10/2015 02:07   Dg Chest Port 1 View  10/08/2015  CLINICAL DATA:  Dialysis. EXAM: PORTABLE CHEST 1 VIEW COMPARISON:  09/28/2015 . FINDINGS: Interim placement dialysis catheter. Is tip is at the cavoatrial junction. Right subclavian central line in stable position. Feeding tube and left chest tube in stable position. AICD and left ventricular assist device in stable position. Cardiomegaly with diffuse bilateral pulmonary alveolar infiltrates and pleural effusions consistent congestive heart failure . IMPRESSION: 1. Interim placement dialysis catheter, its tip is at the cavoatrial junction. Remaining lines and tubes in stable position. No pneumothorax. 2. AICD and left ventricular assist device stable position. Persistent severe cardiomegaly. Diffuse bilateral pulmonary alveolar infiltrates and pleural effusions consistent with congestive heart failure. Electronically Signed   By: Maisie Fus  Register   On: 10/21/2015 14:45   Dg Chest Port 1 View  10/08/2015  CLINICAL DATA:  INSERTION OF IMPLANTABLE LEFT VENTRICULAR ASSIST DEVICE (N/A Chest)  TRANSESOPHAGEAL ECHOCARDIOGRAM (TEE) (N/A ) [id number] CPT (R) EXAM: PORTABLE CHEST 1 VIEW COMPARISON:  10/08/2015 FINDINGS: Cardiac silhouette remains enlarged. There is no mediastinal widening. Visible portion of the ventricular assist device is stable projecting over the left ventricle. There is persistent opacity in the lung bases and left perihilar region consistent with a combination of pleural effusions and atelectasis. No overt pulmonary edema. Right subclavian central venous line, left-sided chest tube, enteric feeding tube and biventricular cardioverter-defibrillator are all stable in well positioned. No pneumothorax. IMPRESSION: 1. Findings are similar to the previous day's study allowing for differences in technique. 2. Persistent lower lung zone and left perihilar opacity consistent with a combination of pleural effusions and atelectasis. No overt pulmonary edema. 3. No pneumothorax. 4. Support apparatus is stable and well positioned. Electronically Signed   By: Amie Portland M.D.   On: 10/27/2015 08:14   Dg Abd Portable 1v  10/01/2015  CLINICAL DATA:  Left ventricular assist device EXAM: PORTABLE ABDOMEN - 1 VIEW COMPARISON:  None. FINDINGS: Left ventricular assist device is unchanged in position. Status post median sternotomy. Cardiac pacemaker leads partially visualized. Stable feeding tube position with tip in distal stomach. Mild intraluminal gas within small bowel loops ankle probable mild ileus. No air-fluid levels are noted to suggest obstruction. IMPRESSION: Stable left ventricular assist device position. Status post median sternotomy. Stable NG feeding tube position. Mild intraluminal gas within small bowel loops and colon probable mild ileus. No evidence of small bowel obstruction. Electronically Signed   By: Natasha Mead M.D.   On: 09/27/2015 08:15     Medications:     Scheduled Medications: . anidulafungin  100 mg Intravenous Q24H  . antiseptic oral rinse  7 mL Mouth Rinse BID  .  aspirin  81 mg Oral Daily  . ceFEPime (MAXIPIME) IV  2 g Intravenous Q12H  . dextrose  25 mL Intravenous Once  . dextrose      . docusate sodium  200 mg Oral Daily  . feeding supplement  237 mL Oral TID WC  . furosemide  40 mg Intravenous Once  . insulin aspart  0-5 Units Subcutaneous QHS  . insulin aspart  0-9 Units Subcutaneous TID WC  . levofloxacin (LEVAQUIN) IV  250 mg Intravenous Q24H  . pantoprazole sodium  40 mg Per Tube Daily  . sildenafil  40 mg Oral TID  . sodium chloride  10-40 mL Intracatheter Q12H  . vancomycin  750 mg Intravenous Q24H  . warfarin  2 mg Oral q1800  . Warfarin - Physician Dosing Inpatient  Does not apply q1800    Infusions: . sodium chloride Stopped (09/25/15 1100)  . sodium chloride 250 mL (07-Nov-2015 0816)  . dexmedetomidine 0.2 mcg/kg/hr (10/10/15 0900)  . dextrose 50 mL/hr at 10/10/15 0600  . epinephrine 3 mcg/min (10/10/15 0800)  . feeding supplement (OSMOLITE 1.5 CAL) 30 mL (10/10/15 0900)  . lactated ringers Stopped (09/26/15 0000)  . lactated ringers Stopped (09/24/15 0700)  . milrinone 0.25 mcg/kg/min (10/10/15 0800)  . norepinephrine (LEVOPHED) Adult infusion 6 mcg/min (10/10/15 1300)  . dialysis replacement fluid (prismasate) 500 mL/hr at 10/10/15 1245  . dialysis replacement fluid (prismasate) 300 mL/hr at 10/10/15 0937  . dialysate (PRISMASATE) 1,500 mL/hr at 10/10/15 1250    PRN Medications: sodium chloride, Place/Maintain arterial line **AND** sodium chloride, acetaminophen, albumin human, fentaNYL (SUBLIMAZE) injection, Gerhardt's butt cream, heparin, hydrALAZINE, ondansetron (ZOFRAN) IV, oxyCODONE, sodium chloride   Assessment:  VAD flow parameters and function are satisfactory Echocardiogram performed earlier this week shows adequate RV function and good decompression of LV.  Patient's clinical condition however has deteriorated-  Started with Candida species in the  the blood and now with clinical severe sepsis and multi-system  failure. I discussed the situation with the patient's wife and she understands the probable cause of his his deterioration. She understands that the goal of both CVVH and mechanical ventilation are to provide support and the opportunity to clear his infection.  We'll continue continuous tube feeds to try to improve his nutritional status--currently with severe protein malnutrition.    Plan/Discussion:      I reviewed the LVAD parameters from today, and compared the results to the patient's prior recorded data.  No programming changes were made.  The LVAD is functioning within specified parameters.  The patient performs LVAD self-test daily.  LVAD interrogation was negative for any significant power changes, alarms or PI events/speed drops.  LVAD equipment check completed and is in good working order.  Back-up equipment present.   LVAD education done on emergency procedures and precautions and reviewed exit site care.  Length of Stay: 24  Kathlee Nations Trigt III 10/10/2015, 2:25 PM

## 2015-10-10 NOTE — Progress Notes (Signed)
Patient ID: Ruth Tully, male   DOB: [birth year], 75 y.o.   MRN: [id number]    Advanced Heart Failure Rounding Note HeartMate 2 Rounding Note  Subjective:   Admitted from [facility] with cardiogenic shock for advanced heart failure as INTERMACS-1.  On 12/22 foley placed by Urology requiring urethral dilation -> UTI . ? Septic shock. Placed on vanco/zosyn. IABP placed 12/23. S/p HM 2 LVAD placement 09/11/2015.   Emergently intubated last night for respiratory distress and obtundation. Remains on CVVHD pulling 50/hr. Abx broadened. Now on meropenem and andulafungin.   On levophed 6 and epi 3. Sedated with precedex. CVP 14. Co-ox 68%. On D10 for hypoglycemia  CXR: unchanged. Basilar infiltrates and small effusions  Blood Cx 09/29/15- 1/2 yeast ->speciation still pending -Lab sent to [facility] Urine Cx 09/29/15 - NG UA 09/29/15- with few bacteria  C. Diff 09/29/15 - Negative Resp Culture 09/25/16 - Negative. Resp Culture 09/30/15 -R are GNR/GPC -> STENOTROPHOMONAS MALTOPHILIA    LVAD INTERROGATION:  HeartMate II LVAD:  Flow 7.9 liters/min, speed 9000, power 6.6  , PI  5.8  5-10 PI events per day.     Objective:    Vital Signs:   Temp:  [97.4 F (36.3 C)-99.9 F (37.7 C)] 97.4 F (36.3 C) (01/14 0800) Pulse Rate:  [31-101] 90 (01/14 1200) Resp:  [17-34] 21 (01/14 1200) BP: (92-127)/(70-89) 92/70 mmHg (01/14 1100) SpO2:  [90 %-100 %] 100 % (01/14 1200) Arterial Line BP: (67-127)/(62-82) 120/75 mmHg (01/14 1200) FiO2 (%):  [50 %-100 %] 50 % (01/14 0800) Last BM Date: 2015-10-30 Mean arterial Pressure 80s  Intake/Output:   Intake/Output Summary (Last 24 hours) at 10/10/15 1217 Last data filed at 10/10/15 1200  Gross per 24 hour  Intake 2159.38 ml  Output   3273 ml  Net -1113.62 ml     Physical Exam: CVP ~14 General: intubated. Awakens to voice and follows commands HEENT: normal x for panda and ETT Neck: supple.  JVP to jaw.  Carotids 2+ bilat; no bruits. No thyromegaly or nodule  noted.  Cor: PMI nondisplaced. RRR. LVAD hum present.  Lungs: Decreased bases bilaterally. Abdomen: soft, NT, mildly distended , no HSM. No bruits or masses. Hypoactive BS Extremities: no cyanosis, clubbing, rash. RUE PICC  Rand LLE 2- 3+ edema into thighs Neuro: intubated but awakens and follows comamnds moves all 4 extremities w/o difficulty GU: Foley  Telemetry: reviewed personally, a sensed v pacing 90s   Labs: Basic Metabolic Panel:  Recent Labs Lab 10/08/15 0335 10/08/15 0530 10/08/15 1248 Oct 30, 2015 0435 2015/10/30 1446 10/10/15 0413 10/10/15 0704  NA 143 143 142 142 141 142  --   K 6.7* 4.8 4.2 4.5 4.7 4.7  --   CL 111 111 108 109 106 110  --   CO2 24 23  --  22 21* 25  --   GLUCOSE 132* 135* 241* 117* 142* <20* 24*  BUN 63* 62* 67* 72* 74* 49*  --   CREATININE 3.75* 3.85* 3.70* 4.22* 3.78* 2.53*  --   CALCIUM 8.9 9.0  --  9.1 9.3 9.5  --   MG  --   --   --   --   --  2.3  --   PHOS  --   --   --   --  5.8* 3.9  --     Liver Function Tests:  Recent Labs Lab 30-Oct-2015 0435 2015/10/30 1446 10/10/15 0413  AST 52*  --  61*  ALT 23  --  25  ALKPHOS 122  --  122  BILITOT 0.5  --  1.0  PROT 6.0*  --  6.7  ALBUMIN 3.3* 3.6 3.6   No results for input(s): LIPASE, AMYLASE in the last 168 hours. No results for input(s): AMMONIA in the last 168 hours.  CBC:  Recent Labs Lab 10/07/15 1300  10/08/15 0335 10/08/15 1248 09/28/2015 0435 10/13/2015 1446 10/10/15 0413  WBC 10.0  --  10.3  --  11.5* 13.0* 13.3*  HGB 9.0*  < > 8.9* 9.5* 8.1* 10.5* 9.5*  HCT 26.2*  < > 26.7* 28.0* 24.0* 29.7* 27.8*  MCV 84.8  --  87.5  --  86.6 83.4 85.5  PLT 703*  --  718*  --  841* 933* 811*  < > = values in this interval not displayed.  INR:  Recent Labs Lab 10/06/15 0407 10/07/15 0502 10/08/15 0430 10/05/2015 0435 10/10/15 0413  INR 2.87* 3.34* 2.68* 2.91* 2.54*    Other results:    Imaging: Dg Chest Port 1 View  10/10/2015  CLINICAL DATA:  Shortness of breath on  exertion. Acute on chronic renal failure. Congestive heart failure. Atrial fibrillation. Pulmonary arterial hypertension. EXAM: PORTABLE CHEST 1 VIEW COMPARISON:  10/10/2015 FINDINGS: Support lines and tubes in appropriate position including left ventricular assist device and left chest tube. No pneumothorax visualized. Cardiomegaly stable. Bilateral lower lung airspace opacity and bilateral pleural effusions show no significant change. IMPRESSION: No significant change in cardiomegaly, bilateral lower lung airspace opacity and pleural effusions. No pneumothorax visualized. Electronically Signed   By: Myles Rosenthal M.D.   On: 10/10/2015 11:00   Dg Chest Port 1 View  10/10/2015  CLINICAL DATA:  Endotracheal tube placement. Shortness of breath. Initial encounter. EXAM: PORTABLE CHEST 1 VIEW COMPARISON:  Chest radiograph from 09/28/2015 FINDINGS: The patient's endotracheal tube is seen ending 1-2 cm above the carina. This should be retracted 1-2 cm. An enteric tube is noted extending below the diaphragm. A right IJ line is noted ending about the right atrium. A left-sided chest tube is noted ending near the left lung apex. Small bilateral pleural effusions are seen. Patchy left-sided airspace opacification could reflect mild asymmetric interstitial edema. No pneumothorax is seen. The cardiomediastinal silhouette is enlarged. The patient is status post median sternotomy. A left ventricular assist device is noted. A pacemaker/AICD is noted overlying the left chest wall, with leads ending overlying the right atrium, right ventricle and coronary sinus. No acute osseous abnormalities are seen. IMPRESSION: 1. Endotracheal tube seen ending 1-2 cm above the carina. This should be retracted 1-2 cm, as deemed clinically appropriate. 2. Small bilateral pleural effusions noted. Patchy left-sided airspace opacification could reflect mild asymmetric interstitial edema. Cardiomegaly noted. Electronically Signed   By: Roanna Raider  M.D.   On: 10/10/2015 02:07   Dg Chest Port 1 View  10/19/2015  CLINICAL DATA:  Dialysis. EXAM: PORTABLE CHEST 1 VIEW COMPARISON:  10/04/2015 . FINDINGS: Interim placement dialysis catheter. Is tip is at the cavoatrial junction. Right subclavian central line in stable position. Feeding tube and left chest tube in stable position. AICD and left ventricular assist device in stable position. Cardiomegaly with diffuse bilateral pulmonary alveolar infiltrates and pleural effusions consistent congestive heart failure . IMPRESSION: 1. Interim placement dialysis catheter, its tip is at the cavoatrial junction. Remaining lines and tubes in stable position. No pneumothorax. 2. AICD and left ventricular assist device stable position. Persistent severe cardiomegaly. Diffuse bilateral pulmonary alveolar infiltrates and pleural effusions consistent with congestive heart  failure. Electronically Signed   By: Maisie Fus  Register   On: 10/20/2015 14:45   Dg Chest Port 1 View  20-Oct-2015  CLINICAL DATA:  INSERTION OF IMPLANTABLE LEFT VENTRICULAR ASSIST DEVICE (N/A Chest) TRANSESOPHAGEAL ECHOCARDIOGRAM (TEE) (N/A ) [id number] CPT (R) EXAM: PORTABLE CHEST 1 VIEW COMPARISON:  10/08/2015 FINDINGS: Cardiac silhouette remains enlarged. There is no mediastinal widening. Visible portion of the ventricular assist device is stable projecting over the left ventricle. There is persistent opacity in the lung bases and left perihilar region consistent with a combination of pleural effusions and atelectasis. No overt pulmonary edema. Right subclavian central venous line, left-sided chest tube, enteric feeding tube and biventricular cardioverter-defibrillator are all stable in well positioned. No pneumothorax. IMPRESSION: 1. Findings are similar to the previous day's study allowing for differences in technique. 2. Persistent lower lung zone and left perihilar opacity consistent with a combination of pleural effusions and atelectasis. No overt  pulmonary edema. 3. No pneumothorax. 4. Support apparatus is stable and well positioned. Electronically Signed   By: Amie Portland M.D.   On: 20-Oct-2015 08:14   Dg Abd Portable 1v  20-Oct-2015  CLINICAL DATA:  Left ventricular assist device EXAM: PORTABLE ABDOMEN - 1 VIEW COMPARISON:  None. FINDINGS: Left ventricular assist device is unchanged in position. Status post median sternotomy. Cardiac pacemaker leads partially visualized. Stable feeding tube position with tip in distal stomach. Mild intraluminal gas within small bowel loops ankle probable mild ileus. No air-fluid levels are noted to suggest obstruction. IMPRESSION: Stable left ventricular assist device position. Status post median sternotomy. Stable NG feeding tube position. Mild intraluminal gas within small bowel loops and colon probable mild ileus. No evidence of small bowel obstruction. Electronically Signed   By: Natasha Mead M.D.   On: 10/20/2015 08:15     Medications:     Scheduled Medications: . anidulafungin  100 mg Intravenous Q24H  . antiseptic oral rinse  7 mL Mouth Rinse BID  . aspirin  81 mg Oral Daily  . ceFEPime (MAXIPIME) IV  2 g Intravenous Q12H  . docusate sodium  200 mg Oral Daily  . feeding supplement  237 mL Oral TID WC  . furosemide  40 mg Intravenous Once  . insulin aspart  0-5 Units Subcutaneous QHS  . insulin aspart  0-9 Units Subcutaneous TID WC  . levofloxacin (LEVAQUIN) IV  250 mg Intravenous Q24H  . pantoprazole sodium  40 mg Per Tube Daily  . sildenafil  40 mg Oral TID  . sodium chloride  10-40 mL Intracatheter Q12H  . vancomycin  750 mg Intravenous Q24H  . warfarin  2 mg Oral q1800  . Warfarin - Physician Dosing Inpatient   Does not apply q1800    Infusions: . sodium chloride Stopped (09/25/15 1100)  . sodium chloride 250 mL (10-20-2015 0816)  . dexmedetomidine 0.2 mcg/kg/hr (10/10/15 0825)  . dextrose 50 mL/hr at 10/10/15 0600  . epinephrine 3 mcg/min (10/10/15 0700)  . feeding supplement  (OSMOLITE 1.5 CAL) 30 mL (10/10/15 0900)  . lactated ringers Stopped (09/26/15 0000)  . lactated ringers Stopped (09/24/15 0700)  . milrinone 0.25 mcg/kg/min (10/10/15 0700)  . norepinephrine (LEVOPHED) Adult infusion 9 mcg/min (10/10/15 0700)  . dialysis replacement fluid (prismasate) 500 mL/hr at 10/10/15 0917  . dialysis replacement fluid (prismasate) 300 mL/hr at 10/10/15 0937  . dialysate (PRISMASATE) 1,500 mL/hr at 10/10/15 0547    PRN Medications: sodium chloride, Place/Maintain arterial line **AND** sodium chloride, acetaminophen, albumin human, fentaNYL (SUBLIMAZE) injection, Gerhardt's butt cream,  heparin, hydrALAZINE, ondansetron (ZOFRAN) IV, oxyCODONE, sodium chloride   Assessment/Plan   1. Acute on chronic systolic HF -> Cardiogenic shock: NICM with EF 5%, mild to moderate RV hypokinesis.  S/p St Jude CRT-D. s/p HM II LVAD placement 09/14/2015.  - Markedly volume overloaded. Now on CVVHD. CVP ~14   Pull as tolerated.  - On epi 3, levo at 6, milrinone at 0.25.   2. Acute respiratory failure -intubated 1/13 for respiratory distress. S/p left chest tube for effusion. On vanc, cefipime and levofloxacin 3. Septic shock: Now with fungemia and stenotrophamonas in sputum -  On vanc, cefipime, levofloxacin and andulafungin  - Wean pressors as tolerated 4. Hypoglycemia -continue d10 for now 5. Acute on chronic renal failure, stage V -due to ATN. On CVVHD 6. Atrial fibrillation: Paroxysmal.    - Maintaining sinus  - On coumadin and po amiodarone. 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 Urology consulted before LVAD with foley placed. Foley was removed 10/05/15. Urine culture sent. Urology placed foley 1/11.   8. Anemia- Received 1 UPRBCs on 10/07/15. Todays Hgb 8.1. Receiving another unit of blood.   9. L Pleural Effusion- S/P chest tube. Output slowing.    10. Anticoagulation -Continue ASA and couamdin  The patient is critically ill with multiple organ systems failure and  requires high complexity decision making for assessment and support, frequent evaluation and titration of therapies, application of advanced monitoring technologies and extensive interpretation of multiple databases.   Critical Care Time devoted to patient care services described in this note is 35 Minutes. I reviewed the LVAD parameters from today, and compared the results to the patient's prior recorded data.  No programming changes were made.  The LVAD is functioning within specified parameters. LVAD interrogation was negative for any significant power changes, alarms or PI events/speed drops.  LVAD equipment check completed and is in good working order.  Back-up equipment present.   Length of Stay: 24  Arvilla Meres MD  10/10/2015, 12:17 PM  VAD Team --- VAD ISSUES ONLY--- Pager [phone] (7am - 7am)

## 2015-10-10 NOTE — Progress Notes (Signed)
Patient ID: Rani Sisney, male   DOB: 09-14-1941, 75 y.o.   MRN: [id number] [facility]    Date of Admission:  08/27/2015   Total days of antibiotics 24        Day 10 anidulafungin        Day 8 therapy for stenotrophomonas pneumonia        Day 3 vancomycin        Day 3 cefepime         Principal Problem:   Fungemia Active Problems:   Acute on chronic systolic and diastolic heart failure, NYHA class 4 (HCC)   LVAD (left ventricular assist device) present (HCC)   Automatic implantable cardioverter-defibrillator in situ   Cardiogenic shock (HCC)   Acute renal failure superimposed on stage 3 chronic kidney disease (HCC)   PAF (paroxysmal atrial fibrillation) (HCC)   PAH (pulmonary artery hypertension) (HCC)   Dyspnea   Hypotension   Palliative care encounter   CHF (congestive heart failure) (HCC)   AKI (acute kidney injury) (HCC)   HCAP (healthcare-associated pneumonia)   Renal failure (ARF), acute on chronic (HCC)   Septic shock (HCC)   . anidulafungin  100 mg Intravenous Q24H  . antiseptic oral rinse  7 mL Mouth Rinse BID  . aspirin  81 mg Oral Daily  . ceFEPime (MAXIPIME) IV  2 g Intravenous Q12H  . docusate sodium  200 mg Oral Daily  . feeding supplement  237 mL Oral TID WC  . furosemide  40 mg Intravenous Once  . insulin aspart  0-5 Units Subcutaneous QHS  . insulin aspart  0-9 Units Subcutaneous TID WC  . levofloxacin (LEVAQUIN) IV  250 mg Intravenous Q24H  . pantoprazole sodium  40 mg Per Tube Daily  . sildenafil  40 mg Oral TID  . sodium chloride  10-40 mL Intracatheter Q12H  . vancomycin  750 mg Intravenous Q24H  . warfarin  2 mg Oral q1800  . Warfarin - Physician Dosing Inpatient   Does not apply q1800   Review of Systems: Review of Systems  Unable to perform ROS: patient nonverbal    Past Medical History  Diagnosis Date  . 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 CKD (chronic  kidney disease) stage 3, GFR 30-59 ml/min   . V-tach (HCC)   . PAF (paroxysmal atrial fibrillation) (HCC)   . CVA (cerebral infarction)   . BPH (benign prostatic hyperplasia)   . HTN (hypertension), benign   . Hyperlipidemia     Social History  Substance Use Topics  . Smoking status: Never Smoker   . Smokeless tobacco: None  . Alcohol Use: No    Family History  Problem Relation Age of Onset  . Hypertension Mother   . CVA Maternal Aunt    No Known Allergies  OBJECTIVE: Filed Vitals:   10/10/15 1200 10/10/15 1234 10/10/15 1300 10/10/15 1400  BP: 120/75  92/69 96/70  Pulse: 90  90 90  Temp:  97.8 F (36.6 C)    TempSrc:  Oral    Resp: Height:      Weight:      SpO2: 100%  100% 100%   Body mass index is 22.13 kg/(m^2).  Physical Exam  Constitutional: No distress.  He was intubated emergently last night. He is now alert and on the ventilator.  Cardiovascular: Normal rate  and regular rhythm.   No murmur heard. Pulmonary/Chest:  AICD site appears normal. Diminished breath sounds on left.   Abdominal: Soft. There is no tenderness.  Increased mucoid drainage from driveline site noted by nurses.  Genitourinary:  Blood-tinged urine in Foley bag.  Skin:  Central line site looks okay.    Lab Results Lab Results  Component Value Date   WBC 13.3* 10/10/2015   HGB 9.5* 10/10/2015   HCT 27.8* 10/10/2015   MCV 85.5 10/10/2015   PLT 811* 10/10/2015    Lab Results  Component Value Date   CREATININE 2.53* 10/10/2015   BUN 49* 10/10/2015   NA 142 10/10/2015   K 4.7 10/10/2015   CL 110 10/10/2015   CO2 25 10/10/2015    Lab Results  Component Value Date   ALT 25 10/10/2015   AST 61* 10/10/2015   ALKPHOS 122 10/10/2015   BILITOT 1.0 10/10/2015     Microbiology: Recent Results (from the past 240 hour(s))  Culture, blood (routine x 2)     Status: None   Collection Time: 10/01/15  9:55 AM  Result Value Ref Range Status   Specimen Description BLOOD  LEFT WRIST  Final   Special Requests BOTTLES DRAWN AEROBIC AND ANAEROBIC 5CC  Final   Culture NO GROWTH 5 DAYS  Final   Report Status 10/06/2015 FINAL  Final  Culture, blood (routine x 2)     Status: None   Collection Time: 10/01/15 10:03 AM  Result Value Ref Range Status   Specimen Description BLOOD LEFT HAND  Final   Special Requests IN PEDIATRIC BOTTLE 3CC  Final   Culture NO GROWTH 5 DAYS  Final   Report Status 10/06/2015 FINAL  Final  Culture, blood (single)     Status: None   Collection Time: 10/05/15 10:32 AM  Result Value Ref Range Status   Specimen Description BLOOD CENTRAL LINE  Final   Special Requests BOTTLES DRAWN AEROBIC AND ANAEROBIC 5CC  Final   Culture NO GROWTH 5 DAYS  Final   Report Status 10/10/2015 FINAL  Final  Culture, Urine     Status: None   Collection Time: 10/06/15  4:30 PM  Result Value Ref Range Status   Specimen Description URINE, CLEAN CATCH  Final   Special Requests Levaquin, anidulafungin  Final   Culture NO GROWTH 1 DAY  Final   Report Status 10/07/2015 FINAL  Final  Urine culture     Status: None   Collection Time: 10/07/15  4:25 PM  Result Value Ref Range Status   Specimen Description URINE, CATHETERIZED  Final   Special Requests NONE  Final   Culture NO GROWTH 1 DAY  Final   Report Status 10/08/2015 FINAL  Final  Body fluid culture     Status: None (Preliminary result)   Collection Time: 10/08/15  9:20 AM  Result Value Ref Range Status   Specimen Description FLUID LEFT PLEURAL  Final   Special Requests NONE  Final   Gram Stain   Final    ABUNDANT WBC PRESENT,BOTH PMN AND MONONUCLEAR NO ORGANISMS SEEN    Culture NO GROWTH 2 DAYS  Final   Report Status PENDING  Incomplete  Culture, blood (routine x 2)     Status: None (Preliminary result)   Collection Time: 10/08/15 10:20 AM  Result Value Ref Range Status   Specimen Description BLOOD RIGHT HAND  Final   Special Requests BOTTLES DRAWN AEROBIC AND ANAEROBIC 10CC  Final   Culture NO  GROWTH  2 DAYS  Final   Report Status PENDING  Incomplete  Culture, blood (routine x 2)     Status: None (Preliminary result)   Collection Time: 10/08/15 10:31 AM  Result Value Ref Range Status   Specimen Description BLOOD LEFT HAND  Final   Special Requests BOTTLES DRAWN AEROBIC AND ANAEROBIC 10CC  Final   Culture NO GROWTH 2 DAYS  Final   Report Status PENDING  Incomplete  Culture, Urine     Status: None   Collection Time: 10/08/15 12:10 PM  Result Value Ref Range Status   Specimen Description URINE, CATHETERIZED  Final   Special Requests Immunocompromised  Final   Culture NO GROWTH 1 DAY  Final   Report Status 10/01/2015 FINAL  Final     ASSESSMENT: He continues to have a very complicated course after recent LVAD placement. He had transient fungemia. His fungal susceptibility test are still pending. I will continue anidulafungin. He has now completed an eight-day course of therapy for stenotrophomonas pneumonia. He is on broader empiric antibiotic therapy because of recent fevers. Blood, pleural fluid and urine cultures are negative to date. Sputum cultures are pending.  PLAN: 1. Continue anidulafungin, cefepime and vancomycin 2. Discontinue levofloxacin 3. Await final microbiology results   [name], MD [facility] [phone] pager   [phone] cell 10/10/2015, 2:51 PM

## 2015-10-10 NOTE — Progress Notes (Signed)
Hypoglycemic Event  CBG: 42  Treatment: D50 IV 50 mL  Symptoms: None  Follow-up CBG: Time: 2235 CBG Result: 114  Possible Reasons for Event: Unknown  Comments/MD notified: MD notified during rounds    Jeri Modena

## 2015-10-10 NOTE — Progress Notes (Signed)
[facility] Progress Note    Assessment/ Plan:   Note: 75 y.o. male who was transferred for cardiogenic shock w/ an EF of 5% with LVAD placement on 09/13/2015. He also has a h/o prostatic urethral stricture s/p TURP 2003 with recent dilatation in 08/2015. He has had a complicated course with an initial creatine in the 2's which improved to the 1.7-2 range with fluctuation but recently since 1/7 the creatinine has started to rise consistently with decreasing UOP as well. He has been on Milrinone during the hospitalization and is back on Levophed + Epinephrine, currently off Dopamine. He was treated for an UTI, PNA with exudative pleural efusion + CT placement as well. He has had fungemia and Stenotrophomonas in the sputum treated with Zosyn + imipenem currently on anidulafungin. He was also on Bactrim 1/7-1/10. ATN on microscopy seen 1/13 --> started on CRRT on 10-26-2022. Hypoglycemic episodes + coded as well on 26-Oct-2022 AM.   1. Acute Kidney Injury on CKD with BL creatinine in the 1.7-2 range - Currently in ATN secondary to hypotension + PNA + exudate effusion + fungemia. Patient also on Bactrim (1/7-1/10) but ATN likely more from the hypotension and infectious etiology than medication related. UOP has been decreasing as well and patient becoming more overloaded currently on Milrinone, Levophed, and Epinephrine (off Dopamine). - I personally evaluated a urine microscopy and there was evidence of ATN. - I was concerned that diuretics would drop his blood pressures more with the associated venodilation and initiated CRRT on 1/13 to manage his volume status. Overnight did well on CRRT with net UF of 78ml/hr. There were events overnight not related to the CRRT. - Meds are already being renally dosed; will need to adjust when pt is on CRRT.  2. PNA with exudative left sided effusion - currently still has a CT. 3. Paroxysmal Afib - On coumadin and amiodarone - Sinus at time of exam. 4. Pulmonary  HTN 5. Fungemia and stenotrophamonas sputum - On anidulafungin (no renal dosing), levaquin renally dosed. 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  Subjective:   Hypoglycemic episodes overnight +code and intubation not related to the CRRT. Titrating down on the Levophed this AM now . His wife was bedside and the pt was cooperative following commands even though he was intubated.  Seen on CRRT. Pre/post/hd 500/300/1500ml/hr Qb177ml/min RIJ cath. UF - net 37ml/hr No change; want to limit the UF and not be aggressive. If he drops his pressures we'll end up giving him fluids anyways.   Objective:   BP 92/70 mmHg  Pulse 90  Temp(Src) 97.4 F (36.3 C) (Axillary)  Resp 17  Ht  (1.702 m)  Wt 64.093 kg (141 lb 4.8 oz)  BMI 22.13 kg/m2  SpO2 100%  Intake/Output Summary (Last 24 hours) at 2015-10-27 1145 Last data filed at 27-Oct-2015 1100  Gross per 24 hour  Intake 2613.48 ml  Output   2977 ml  Net -363.52 ml   Weight change:   Physical Exam: General appearance: alert and appears stated age Back: symmetric, no curvature. ROM normal. No CVA tenderness. Resp: Intubated Cardio: RRR GI: soft, non-tender; bowel sounds normal; no masses, no organomegaly Extremities: edema 2+ Pulses: 2+ and symmetric Neurologic: Awake, alert and responsive  Imaging: Dg Chest Port 1 View  27-Oct-2015  CLINICAL DATA:  Shortness of breath on exertion. Acute on chronic renal failure. Congestive heart failure. Atrial fibrillation. Pulmonary arterial hypertension. EXAM: PORTABLE CHEST 1 VIEW COMPARISON:  October 27, 2015 FINDINGS:  Support lines and tubes in appropriate position including left ventricular assist device and left chest tube. No pneumothorax visualized. Cardiomegaly stable. Bilateral lower lung airspace opacity and bilateral pleural effusions show no significant change. IMPRESSION: No significant change in cardiomegaly, bilateral lower lung airspace opacity and pleural  effusions. No pneumothorax visualized. Electronically Signed   By: Myles Rosenthal M.D.   On: 10/10/2015 11:00   Dg Chest Port 1 View  10/10/2015  CLINICAL DATA:  Endotracheal tube placement. Shortness of breath. Initial encounter. EXAM: PORTABLE CHEST 1 VIEW COMPARISON:  Chest radiograph from 10/10/2015 FINDINGS: The patient's endotracheal tube is seen ending 1-2 cm above the carina. This should be retracted 1-2 cm. An enteric tube is noted extending below the diaphragm. A right IJ line is noted ending about the right atrium. A left-sided chest tube is noted ending near the left lung apex. Small bilateral pleural effusions are seen. Patchy left-sided airspace opacification could reflect mild asymmetric interstitial edema. No pneumothorax is seen. The cardiomediastinal silhouette is enlarged. The patient is status post median sternotomy. A left ventricular assist device is noted. A pacemaker/AICD is noted overlying the left chest wall, with leads ending overlying the right atrium, right ventricle and coronary sinus. No acute osseous abnormalities are seen. IMPRESSION: 1. Endotracheal tube seen ending 1-2 cm above the carina. This should be retracted 1-2 cm, as deemed clinically appropriate. 2. Small bilateral pleural effusions noted. Patchy left-sided airspace opacification could reflect mild asymmetric interstitial edema. Cardiomegaly noted. Electronically Signed   By: Roanna Raider M.D.   On: 10/10/2015 02:07   Dg Chest Port 1 View  10/18/2015  CLINICAL DATA:  Dialysis. EXAM: PORTABLE CHEST 1 VIEW COMPARISON:  10/23/2015 . FINDINGS: Interim placement dialysis catheter. Is tip is at the cavoatrial junction. Right subclavian central line in stable position. Feeding tube and left chest tube in stable position. AICD and left ventricular assist device in stable position. Cardiomegaly with diffuse bilateral pulmonary alveolar infiltrates and pleural effusions consistent congestive heart failure . IMPRESSION: 1.  Interim placement dialysis catheter, its tip is at the cavoatrial junction. Remaining lines and tubes in stable position. No pneumothorax. 2. AICD and left ventricular assist device stable position. Persistent severe cardiomegaly. Diffuse bilateral pulmonary alveolar infiltrates and pleural effusions consistent with congestive heart failure. Electronically Signed   By: Maisie Fus  Register   On: 10/24/2015 14:45   Dg Chest Port 1 View  10/08/2015  CLINICAL DATA:  INSERTION OF IMPLANTABLE LEFT VENTRICULAR ASSIST DEVICE (N/A Chest) TRANSESOPHAGEAL ECHOCARDIOGRAM (TEE) (N/A ) [id number] CPT (R) EXAM: PORTABLE CHEST 1 VIEW COMPARISON:  10/08/2015 FINDINGS: Cardiac silhouette remains enlarged. There is no mediastinal widening. Visible portion of the ventricular assist device is stable projecting over the left ventricle. There is persistent opacity in the lung bases and left perihilar region consistent with a combination of pleural effusions and atelectasis. No overt pulmonary edema. Right subclavian central venous line, left-sided chest tube, enteric feeding tube and biventricular cardioverter-defibrillator are all stable in well positioned. No pneumothorax. IMPRESSION: 1. Findings are similar to the previous day's study allowing for differences in technique. 2. Persistent lower lung zone and left perihilar opacity consistent with a combination of pleural effusions and atelectasis. No overt pulmonary edema. 3. No pneumothorax. 4. Support apparatus is stable and well positioned. Electronically Signed   By: Amie Portland M.D.   On: 09/28/2015 08:14   Dg Abd Portable 1v  10/25/2015  CLINICAL DATA:  Left ventricular assist device EXAM: PORTABLE ABDOMEN - 1 VIEW COMPARISON:  None.  FINDINGS: Left ventricular assist device is unchanged in position. Status post median sternotomy. Cardiac pacemaker leads partially visualized. Stable feeding tube position with tip in distal stomach. Mild intraluminal gas within small bowel loops  ankle probable mild ileus. No air-fluid levels are noted to suggest obstruction. IMPRESSION: Stable left ventricular assist device position. Status post median sternotomy. Stable NG feeding tube position. Mild intraluminal gas within small bowel loops and colon probable mild ileus. No evidence of small bowel obstruction. Electronically Signed   By: Natasha Mead M.D.   On: 10/20/2015 08:15    Labs: BMET  Recent Labs Lab 10/06/15 0510 10/07/15 0502 10/07/15 1712 10/08/15 0335 10/08/15 0530 10/08/15 1248 10/03/2015 0435 10/08/2015 1446 10/10/15 0413 10/10/15 0704  NA 140 142 145 143 143 142 142 141 142  --   K 4.0 4.1 4.3 6.7* 4.8 4.2 4.5 4.7 4.7  --   CL 108 108 111 111 111 108 109 106 110  --   CO2 23 22  --  24 23  --  22 21* 25  --   GLUCOSE 202* 105* 60* 132* 135* 241* 117* 142* <20* 24*  BUN 42* 52* 48* 63* 62* 67* 72* 74* 49*  --   CREATININE 2.70* 3.00* 3.30* 3.75* 3.85* 3.70* 4.22* 3.78* 2.53*  --   CALCIUM 8.6* 9.1  --  8.9 9.0  --  9.1 9.3 9.5  --   PHOS  --   --   --   --   --   --   --  5.8* 3.9  --    CBC  Recent Labs Lab 10/08/15 0335 10/08/15 1248 09/28/2015 0435 10/07/2015 1446 10/10/15 0413  WBC 10.3  --  11.5* 13.0* 13.3*  HGB 8.9* 9.5* 8.1* 10.5* 9.5*  HCT 26.7* 28.0* 24.0* 29.7* 27.8*  MCV 87.5  --  86.6 83.4 85.5  PLT 718*  --  841* 933* 811*    Medications:    . anidulafungin  100 mg Intravenous Q24H  . antiseptic oral rinse  7 mL Mouth Rinse BID  . aspirin  81 mg Oral Daily  . ceFEPime (MAXIPIME) IV  2 g Intravenous Q12H  . docusate sodium  200 mg Oral Daily  . feeding supplement  237 mL Oral TID WC  . furosemide  40 mg Intravenous Once  . insulin aspart  0-5 Units Subcutaneous QHS  . insulin aspart  0-9 Units Subcutaneous TID WC  . levofloxacin (LEVAQUIN) IV  250 mg Intravenous Q24H  . pantoprazole sodium  40 mg Per Tube Daily  . sildenafil  40 mg Oral TID  . sodium chloride  10-40 mL Intracatheter Q12H  . vancomycin  750 mg Intravenous Q24H  .  warfarin  2 mg Oral q1800  . Warfarin - Physician Dosing Inpatient   Does not apply q1800      Paulene Floor, MD 10/10/2015, 11:45 AM

## 2015-10-10 NOTE — Anesthesia Postprocedure Evaluation (Addendum)
Anesthesia Post Note  Patient: Brad [name]  Procedure(s) Performed: * No procedures listed *  Comments:      Last Vitals:  Filed Vitals:   10/10/15 0000 10/10/15 0007  BP:    Pulse: 89   Temp:  37.1 C  Resp: 28     Last Pain:  Filed Vitals:   10/10/15 0007  PainSc: 0-No pain                 Carolene Gitto

## 2015-10-10 NOTE — Progress Notes (Signed)
CRITICAL VALUE ALERT  Critical value received: Glucose < 20  Date of notification: t  Time of notification:  0500  Critical value read back:Yes.    Nurse who received alert:  S. LoveRN   MD notified (1st page): Dr Zenaida Niece trigt  Time of first page:  0525  MD notified (2nd page):  Time of second page:  Responding MD:  Dr Donata Clay  Time MD responded: [phone]

## 2015-10-10 NOTE — Procedures (Signed)
Arterial Catheter Insertion Procedure Note Brad [name] [id number] 1941-09-26  Procedure: Insertion of Arterial Catheter  Indications: Blood pressure monitoring  Procedure Details Consent: Unable to obtain consent because of altered level of consciousness. Time Out: Verified patient identification, verified procedure, site/side was marked, verified correct patient position, special equipment/implants available, medications/allergies/relevent history reviewed, required imaging and test results available.  Performed  Maximum sterile technique was used including antiseptics, cap, gloves, gown, hand hygiene, mask and sheet. Skin prep: Chlorhexidine; local anesthetic administered 20 gauge catheter was inserted into right radial artery using the Seldinger technique.  Evaluation Blood flow good; BP tracing good. Complications: No apparent complications.   Tacy Learn 10/10/2015

## 2015-10-10 NOTE — Anesthesia Procedure Notes (Signed)
Procedure Name: Intubation Date/Time: 10/10/2015 1:57 AM Performed by: Melina Schools Pre-anesthesia Checklist: Patient identified, Emergency Drugs available, Suction available and Patient being monitored Laryngoscope Size: Mac and 3 Grade View: Grade III Tube type: Oral Tube size: 7.5 mm Number of attempts: 1 Airway Equipment and Method: Stylet Placement Confirmation: ETT inserted through vocal cords under direct vision,  CO2 detector and breath sounds checked- equal and bilateral Secured at: 26 cm Tube secured with: Tape Dental Injury: Teeth and Oropharynx as per pre-operative assessment

## 2015-10-10 NOTE — Transfer of Care (Signed)
Immediate Anesthesia Transfer of Care Note  Patient: OOD intubation

## 2015-10-10 NOTE — Progress Notes (Signed)
CT surgery p.m. Rounds  Patient remains alert on ventilator Urine continues to clear CVVH is been very effective for treating uremia--creatinine now 1.7 VAD filling inflows are satisfactory Remains afebrile with multiorgan failure related to sepsis

## 2015-10-10 NOTE — Progress Notes (Signed)
Hypoglycemic Event  CBG: < 20  Treatment: D50 IVP per Dr Donata Clay  Symptoms: Nervous/irritable  Follow-up CBG: Time:0630 CBG Result:35  Possible Reasons for Event: change in medication/TF  Comments/MD notified:Dr Zenaida Niece Trigt aware    Jeri Modena

## 2015-10-10 NOTE — Progress Notes (Signed)
Nutrition Follow-up  DOCUMENTATION CODES:  Underweight  INTERVENTION:  Continue Osmolite 1.5 @ 50 ml/hr via NGT. Add 30 ml Prostat BID for additional 30 grams protein.   Tube feeding regimen provides 2000 kcal, 105 grams of protein, and 914 ml of H2O.   NUTRITION DIAGNOSIS:  Inadequate oral intake related to inability to eat as evidenced by NPO status.  GOAL:  Patient will meet greater than or equal to 90% of their needs  MONITOR:  Diet advancement, Vent status, Labs, Weight trends, TF tolerance  REASON FOR ASSESSMENT:  Ventilator  ASSESSMENT:  75 y.o. male who was transferred for cardiogenic shock w/ an EF of 5% with LVAD placement on 09/02/2015.  He has had a complicated course with worsening renal fx and beed for pressors. He was treated for an UTI, PNA with exudative pleural efusion + CT placement as well. He has had fungemia and Stenotrophomonas in the sputum. ATN on microscopy seen 1/13 --> started on CRRT on 11/02/22. Hypoglycemic episodes + coded as well on November 02, 2022 AM.   Pt seen on 1/9 by RD. At that time pt was receiving nocturnal feeds: Vital 1.5 formula at 60 ml/hr. Run from 7 PM to 7 AM. Total time is 12 hours.  Pt was changed over to Continuous TF-osmolite 1.5 @ 50 on 1/12. MD reports this was due to  intolerance to the vital 1.5 and asked about adding protein to the osmolite 1.5 instead of altering the TF.   Patient is currently intubated on ventilator support MV:9.9 L/min Temp (24hrs), Avg:98.4 F (36.9 C), Min:97.4 F (36.3 C), Max:99.9 F (37.7 C)  Propofol: None  Abdomen-firm, distended  Diet Order:  Diet heart healthy/carb modified Room service appropriate?: Yes; Fluid consistency:: Thin  Skin:  Dry, surgical incision to chest  Last BM:  1/13  Height:  Ht Readings from Last 1 Encounters:  09/01/2015 5\' 7"  (1.702 m)   Weight:  Wt Readings from Last 1 Encounters:  10/04/2015 141 lb 4.8 oz (64.093 kg)   Wt Readings from Last 10 Encounters:  10/15/2015 141  lb 4.8 oz (64.093 kg)  Dosing weight: 115 lbs (52.27 kg)  Ideal Body Weight:  67.27 kg  BMI:  Body mass index using admit wt is 18.00 kg/(m^2).  Estimated Nutritional Needs:  Kcal:  1568  While on vent Protein:  105-131 g (2-2.5 g/kg bw) Fluid:  Per MD  EDUCATION NEEDS:  No education needs identified at this time  Christophe Louis RD, LDN Nutrition Pager: [phone]/11/03 3:58 PM

## 2015-10-10 NOTE — Progress Notes (Signed)
Multiple calls made to Dr Donata Clay r/t a drop in LVAD readings (decr. PI, MAP); Orders received to drip titration and albumins. While carrying out orders; Pt noted to be obtunded with poor respiratory effort and unresponsive. Airway opened and ambu bag was used for improving respiratory effort. Dr Donata Clay called again and orders were received to urgently intubate Pt, place aline, STAT chest x-ray post intubation and NG/OG placement. Pt O2 saturations returned to 96-100% after successful intubation and Pt was able to nod appropriately to questions. Will continue to observe and support patient.

## 2015-10-11 ENCOUNTER — Inpatient Hospital Stay (HOSPITAL_COMMUNITY): Payer: Medicare Other

## 2015-10-11 LAB — CBC
HCT: 28.5 % — ABNORMAL LOW (ref 39.0–52.0)
Hemoglobin: 9.8 g/dL — ABNORMAL LOW (ref 13.0–17.0)
MCH: 29.4 pg (ref 26.0–34.0)
MCHC: 34.4 g/dL (ref 30.0–36.0)
MCV: 85.6 fL (ref 78.0–100.0)
Platelets: 876 10*3/uL — ABNORMAL HIGH (ref 150–400)
RBC: 3.33 MIL/uL — ABNORMAL LOW (ref 4.22–5.81)
RDW: 16.9 % — ABNORMAL HIGH (ref 11.5–15.5)
WBC: 14 10*3/uL — ABNORMAL HIGH (ref 4.0–10.5)

## 2015-10-11 LAB — POCT I-STAT, CHEM 8
BUN: 19 mg/dL (ref 6–20)
BUN: 19 mg/dL (ref 6–20)
BUN: 20 mg/dL (ref 6–20)
BUN: 20 mg/dL (ref 6–20)
BUN: 21 mg/dL — AB (ref 6–20)
BUN: 21 mg/dL — ABNORMAL HIGH (ref 6–20)
BUN: 22 mg/dL — AB (ref 6–20)
BUN: 22 mg/dL — AB (ref 6–20)
BUN: 22 mg/dL — ABNORMAL HIGH (ref 6–20)
BUN: 24 mg/dL — AB (ref 6–20)
BUN: 24 mg/dL — ABNORMAL HIGH (ref 6–20)
BUN: 24 mg/dL — ABNORMAL HIGH (ref 6–20)
BUN: 24 mg/dL — ABNORMAL HIGH (ref 6–20)
BUN: 25 mg/dL — AB (ref 6–20)
CALCIUM ION: 0.57 mmol/L — AB (ref 1.13–1.30)
CALCIUM ION: 0.71 mmol/L — AB (ref 1.13–1.30)
CALCIUM ION: 0.75 mmol/L — AB (ref 1.13–1.30)
CALCIUM ION: 1.11 mmol/L — AB (ref 1.13–1.30)
CALCIUM ION: 1.11 mmol/L — AB (ref 1.13–1.30)
CALCIUM ION: 1.16 mmol/L (ref 1.13–1.30)
CALCIUM ION: 1.18 mmol/L (ref 1.13–1.30)
CHLORIDE: 100 mmol/L — AB (ref 101–111)
CHLORIDE: 88 mmol/L — AB (ref 101–111)
CHLORIDE: 91 mmol/L — AB (ref 101–111)
CHLORIDE: 92 mmol/L — AB (ref 101–111)
CHLORIDE: 94 mmol/L — AB (ref 101–111)
CHLORIDE: 96 mmol/L — AB (ref 101–111)
CHLORIDE: 97 mmol/L — AB (ref 101–111)
CREATININE: 0.8 mg/dL (ref 0.61–1.24)
CREATININE: 0.8 mg/dL (ref 0.61–1.24)
CREATININE: 0.8 mg/dL (ref 0.61–1.24)
CREATININE: 0.9 mg/dL (ref 0.61–1.24)
CREATININE: 1 mg/dL (ref 0.61–1.24)
CREATININE: 1 mg/dL (ref 0.61–1.24)
CREATININE: 1 mg/dL (ref 0.61–1.24)
CREATININE: 1.1 mg/dL (ref 0.61–1.24)
Calcium, Ion: 1.16 mmol/L (ref 1.13–1.30)
Calcium, Ion: 0.65 mmol/L — ABNORMAL LOW (ref 1.13–1.30)
Calcium, Ion: 0.62 mmol/L — CL (ref 1.13–1.30)
Calcium, Ion: 1.15 mmol/L (ref 1.13–1.30)
Calcium, Ion: 0.58 mmol/L — CL (ref 1.13–1.30)
Calcium, Ion: 0.58 mmol/L — CL (ref 1.13–1.30)
Calcium, Ion: 1.13 mmol/L (ref 1.13–1.30)
Chloride: 95 mmol/L — ABNORMAL LOW (ref 101–111)
Chloride: 98 mmol/L — ABNORMAL LOW (ref 101–111)
Chloride: 95 mmol/L — ABNORMAL LOW (ref 101–111)
Chloride: 99 mmol/L — ABNORMAL LOW (ref 101–111)
Chloride: 93 mmol/L — ABNORMAL LOW (ref 101–111)
Chloride: 90 mmol/L — ABNORMAL LOW (ref 101–111)
Chloride: 93 mmol/L — ABNORMAL LOW (ref 101–111)
Creatinine, Ser: 0.8 mg/dL (ref 0.61–1.24)
Creatinine, Ser: 1 mg/dL (ref 0.61–1.24)
Creatinine, Ser: 0.8 mg/dL (ref 0.61–1.24)
Creatinine, Ser: 1 mg/dL (ref 0.61–1.24)
Creatinine, Ser: 0.8 mg/dL (ref 0.61–1.24)
Creatinine, Ser: 1 mg/dL (ref 0.61–1.24)
GLUCOSE: 164 mg/dL — AB (ref 65–99)
GLUCOSE: 166 mg/dL — AB (ref 65–99)
GLUCOSE: 212 mg/dL — AB (ref 65–99)
GLUCOSE: 229 mg/dL — AB (ref 65–99)
GLUCOSE: 239 mg/dL — AB (ref 65–99)
Glucose, Bld: 212 mg/dL — ABNORMAL HIGH (ref 65–99)
Glucose, Bld: 220 mg/dL — ABNORMAL HIGH (ref 65–99)
Glucose, Bld: 241 mg/dL — ABNORMAL HIGH (ref 65–99)
Glucose, Bld: 264 mg/dL — ABNORMAL HIGH (ref 65–99)
Glucose, Bld: 208 mg/dL — ABNORMAL HIGH (ref 65–99)
Glucose, Bld: 158 mg/dL — ABNORMAL HIGH (ref 65–99)
Glucose, Bld: 194 mg/dL — ABNORMAL HIGH (ref 65–99)
Glucose, Bld: 129 mg/dL — ABNORMAL HIGH (ref 65–99)
Glucose, Bld: 173 mg/dL — ABNORMAL HIGH (ref 65–99)
HCT: 30 % — ABNORMAL LOW (ref 39.0–52.0)
HCT: 31 % — ABNORMAL LOW (ref 39.0–52.0)
HCT: 32 % — ABNORMAL LOW (ref 39.0–52.0)
HCT: 33 % — ABNORMAL LOW (ref 39.0–52.0)
HCT: 35 % — ABNORMAL LOW (ref 39.0–52.0)
HCT: 35 % — ABNORMAL LOW (ref 39.0–52.0)
HCT: 36 % — ABNORMAL LOW (ref 39.0–52.0)
HEMATOCRIT: 31 % — AB (ref 39.0–52.0)
HEMATOCRIT: 34 % — AB (ref 39.0–52.0)
HEMATOCRIT: 34 % — AB (ref 39.0–52.0)
HEMATOCRIT: 32 % — AB (ref 39.0–52.0)
HEMATOCRIT: 30 % — AB (ref 39.0–52.0)
HEMATOCRIT: 34 % — AB (ref 39.0–52.0)
HEMATOCRIT: 32 % — AB (ref 39.0–52.0)
HEMOGLOBIN: 10.5 g/dL — AB (ref 13.0–17.0)
HEMOGLOBIN: 11.6 g/dL — AB (ref 13.0–17.0)
HEMOGLOBIN: 11.6 g/dL — AB (ref 13.0–17.0)
HEMOGLOBIN: 11.9 g/dL — AB (ref 13.0–17.0)
HEMOGLOBIN: 10.9 g/dL — AB (ref 13.0–17.0)
HEMOGLOBIN: 12.2 g/dL — AB (ref 13.0–17.0)
Hemoglobin: 10.2 g/dL — ABNORMAL LOW (ref 13.0–17.0)
Hemoglobin: 10.2 g/dL — ABNORMAL LOW (ref 13.0–17.0)
Hemoglobin: 10.5 g/dL — ABNORMAL LOW (ref 13.0–17.0)
Hemoglobin: 10.9 g/dL — ABNORMAL LOW (ref 13.0–17.0)
Hemoglobin: 10.9 g/dL — ABNORMAL LOW (ref 13.0–17.0)
Hemoglobin: 11.2 g/dL — ABNORMAL LOW (ref 13.0–17.0)
Hemoglobin: 11.6 g/dL — ABNORMAL LOW (ref 13.0–17.0)
Hemoglobin: 11.9 g/dL — ABNORMAL LOW (ref 13.0–17.0)
POTASSIUM: 3.7 mmol/L (ref 3.5–5.1)
POTASSIUM: 3.8 mmol/L (ref 3.5–5.1)
POTASSIUM: 3.9 mmol/L (ref 3.5–5.1)
POTASSIUM: 3.9 mmol/L (ref 3.5–5.1)
POTASSIUM: 3.9 mmol/L (ref 3.5–5.1)
POTASSIUM: 4 mmol/L (ref 3.5–5.1)
POTASSIUM: 4 mmol/L (ref 3.5–5.1)
POTASSIUM: 4.1 mmol/L (ref 3.5–5.1)
POTASSIUM: 4.2 mmol/L (ref 3.5–5.1)
POTASSIUM: 4.6 mmol/L (ref 3.5–5.1)
Potassium: 4.3 mmol/L (ref 3.5–5.1)
Potassium: 4.1 mmol/L (ref 3.5–5.1)
Potassium: 4.3 mmol/L (ref 3.5–5.1)
Potassium: 4.3 mmol/L (ref 3.5–5.1)
SODIUM: 140 mmol/L (ref 135–145)
SODIUM: 138 mmol/L (ref 135–145)
SODIUM: 139 mmol/L (ref 135–145)
SODIUM: 140 mmol/L (ref 135–145)
Sodium: 137 mmol/L (ref 135–145)
Sodium: 138 mmol/L (ref 135–145)
Sodium: 139 mmol/L (ref 135–145)
Sodium: 139 mmol/L (ref 135–145)
Sodium: 139 mmol/L (ref 135–145)
Sodium: 139 mmol/L (ref 135–145)
Sodium: 140 mmol/L (ref 135–145)
Sodium: 140 mmol/L (ref 135–145)
Sodium: 140 mmol/L (ref 135–145)
Sodium: 140 mmol/L (ref 135–145)
TCO2: 26 mmol/L (ref 0–100)
TCO2: 27 mmol/L (ref 0–100)
TCO2: 28 mmol/L (ref 0–100)
TCO2: 28 mmol/L (ref 0–100)
TCO2: 28 mmol/L (ref 0–100)
TCO2: 29 mmol/L (ref 0–100)
TCO2: 29 mmol/L (ref 0–100)
TCO2: 30 mmol/L (ref 0–100)
TCO2: 30 mmol/L (ref 0–100)
TCO2: 30 mmol/L (ref 0–100)
TCO2: 33 mmol/L (ref 0–100)
TCO2: 33 mmol/L (ref 0–100)
TCO2: 34 mmol/L (ref 0–100)
TCO2: 35 mmol/L (ref 0–100)

## 2015-10-11 LAB — COMPREHENSIVE METABOLIC PANEL
ALT: 25 U/L (ref 17–63)
AST: 70 U/L — ABNORMAL HIGH (ref 15–41)
Albumin: 3.1 g/dL — ABNORMAL LOW (ref 3.5–5.0)
Alkaline Phosphatase: 127 U/L — ABNORMAL HIGH (ref 38–126)
Anion gap: 7 (ref 5–15)
BUN: 28 mg/dL — ABNORMAL HIGH (ref 6–20)
CO2: 26 mmol/L (ref 22–32)
Calcium: 8.9 mg/dL (ref 8.9–10.3)
Chloride: 104 mmol/L (ref 101–111)
Creatinine, Ser: 1.36 mg/dL — ABNORMAL HIGH (ref 0.61–1.24)
GFR calc Af Amer: 58 mL/min — ABNORMAL LOW (ref >60)
GFR calc non Af Amer: 50 mL/min — ABNORMAL LOW (ref >60)
Glucose, Bld: 158 mg/dL — ABNORMAL HIGH (ref 65–99)
Potassium: 4.7 mmol/L (ref 3.5–5.1)
Sodium: 137 mmol/L (ref 135–145)
Total Bilirubin: 1 mg/dL (ref 0.3–1.2)
Total Protein: 6.1 g/dL — ABNORMAL LOW (ref 6.5–8.1)

## 2015-10-11 LAB — GLUCOSE, CAPILLARY
GLUCOSE-CAPILLARY: 143 mg/dL — AB (ref 65–99)
GLUCOSE-CAPILLARY: 204 mg/dL — AB (ref 65–99)
GLUCOSE-CAPILLARY: 97 mg/dL (ref 65–99)
Glucose-Capillary: 121 mg/dL — ABNORMAL HIGH (ref 65–99)
Glucose-Capillary: 160 mg/dL — ABNORMAL HIGH (ref 65–99)
Glucose-Capillary: 168 mg/dL — ABNORMAL HIGH (ref 65–99)
Glucose-Capillary: 146 mg/dL — ABNORMAL HIGH (ref 65–99)

## 2015-10-11 LAB — RENAL FUNCTION PANEL
ALBUMIN: 3 g/dL — AB (ref 3.5–5.0)
ANION GAP: 11 (ref 5–15)
BUN: 24 mg/dL — ABNORMAL HIGH (ref 6–20)
CHLORIDE: 98 mmol/L — AB (ref 101–111)
CO2: 29 mmol/L (ref 22–32)
Calcium: 9.4 mg/dL (ref 8.9–10.3)
Creatinine, Ser: 1.14 mg/dL (ref 0.61–1.24)
Glucose, Bld: 239 mg/dL — ABNORMAL HIGH (ref 65–99)
PHOSPHORUS: 1.5 mg/dL — AB (ref 2.5–4.6)
POTASSIUM: 4.4 mmol/L (ref 3.5–5.1)
Sodium: 138 mmol/L (ref 135–145)

## 2015-10-11 LAB — POCT I-STAT 3, ART BLOOD GAS (G3+)
ACID-BASE EXCESS: 3 mmol/L — AB (ref 0.0–2.0)
BICARBONATE: 27.7 meq/L — AB (ref 20.0–24.0)
O2 Saturation: 99 %
PH ART: 7.461 — AB (ref 7.350–7.450)
TCO2: 29 mmol/L (ref 0–100)
pCO2 arterial: 38.5 mmHg (ref 35.0–45.0)
pO2, Arterial: 147 mmHg — ABNORMAL HIGH (ref 80.0–100.0)

## 2015-10-11 LAB — LACTATE DEHYDROGENASE: LDH: 390 U/L — ABNORMAL HIGH (ref 98–192)

## 2015-10-11 LAB — PROTIME-INR
INR: 2.68 — ABNORMAL HIGH (ref 0.00–1.49)
Prothrombin Time: 28.1 seconds — ABNORMAL HIGH (ref 11.6–15.2)

## 2015-10-11 LAB — BODY FLUID CULTURE: Culture: NO GROWTH

## 2015-10-11 LAB — PHOSPHORUS: Phosphorus: 2 mg/dL — ABNORMAL LOW (ref 2.5–4.6)

## 2015-10-11 LAB — CARBOXYHEMOGLOBIN
Carboxyhemoglobin: 1.8 % — ABNORMAL HIGH (ref 0.5–1.5)
Methemoglobin: 0.8 % (ref 0.0–1.5)
O2 Saturation: 79.4 %
Total hemoglobin: 9.7 g/dL — ABNORMAL LOW (ref 13.5–18.0)

## 2015-10-11 LAB — MAGNESIUM: MAGNESIUM: 2.2 mg/dL (ref 1.7–2.4)

## 2015-10-11 MED ORDER — ALBUMIN HUMAN 25 % IV SOLN
12.5000 g | Freq: Four times a day (QID) | INTRAVENOUS | Status: AC
  Administered 2015-10-11 (×3): 12.5 g via INTRAVENOUS
  Filled 2015-10-11 (×3): qty 50

## 2015-10-11 MED ORDER — PRISMASOL B22GK 4/0 22-4 MEQ/L IV SOLN
INTRAVENOUS | Status: DC
  Administered 2015-10-11 – 2015-10-12 (×2): via INTRAVENOUS_CENTRAL
  Filled 2015-10-11 (×2): qty 5000

## 2015-10-11 MED ORDER — ANTICOAGULANT SODIUM CITRATE 4% (200MG/5ML) IV SOLN
Status: DC
  Administered 2015-10-11 – 2015-10-12 (×3): via INTRAVENOUS_CENTRAL
  Filled 2015-10-11 (×3): qty 1500

## 2015-10-11 MED ORDER — OSMOLITE 1.5 CAL PO LIQD
1000.0000 mL | ORAL | Status: DC
Start: 2015-10-11 — End: 2015-10-16
  Administered 2015-10-11 – 2015-10-14 (×6): 1000 mL
  Filled 2015-10-11 (×8): qty 1000

## 2015-10-11 MED ORDER — DEXTROSE 5 % IV SOLN
20.0000 g | INTRAVENOUS | Status: DC
  Administered 2015-10-11 – 2015-10-12 (×2): 20 g via INTRAVENOUS_CENTRAL
  Filled 2015-10-11 (×3): qty 200

## 2015-10-11 MED ORDER — INSULIN GLARGINE 100 UNIT/ML ~~LOC~~ SOLN
10.0000 [IU] | Freq: Two times a day (BID) | SUBCUTANEOUS | Status: DC
  Administered 2015-10-11: 10 [IU] via SUBCUTANEOUS
  Filled 2015-10-11 (×2): qty 0.1

## 2015-10-11 NOTE — Progress Notes (Signed)
[facility] Progress Note    Assessment/ Plan:   Note: 75 y.o. male who was transferred for cardiogenic shock w/ an EF of 5% with LVAD placement on 08/31/2015. He also has a h/o prostatic urethral stricture s/p TURP 2003 with recent dilatation in 08/2015. He has had a complicated course with an initial creatine in the 2's which improved to the 1.7-2 range with fluctuation but recently since 1/7 the creatinine has started to rise consistently with decreasing UOP as well. He has been on Milrinone during the hospitalization and is back on Levophed + Epinephrine, currently off Dopamine. He was treated for an UTI, PNA with exudative pleural efusion + CT placement as well. He has had fungemia and Stenotrophomonas in the sputum treated with Zosyn + imipenem currently on anidulafungin. He was also on Bactrim 1/7-1/10. ATN on microscopy seen 1/13 --> started on CRRT on 22-Oct-2022. Hypoglycemic episodes + coded as well on 10/22/2022 AM.   1. Acute Kidney Injury on CKD with BL creatinine in the 1.7-2 range - Currently in ATN secondary to hypotension + PNA + exudate effusion + fungemia. Patient also on Bactrim (1/7-1/10) but ATN likely more from the hypotension and infectious etiology than medication related. UOP has been decreasing as well and patient becoming more overloaded currently on Milrinone, Levophed, and Epinephrine (off Dopamine). - I personally evaluated a urine microscopy and there was evidence of ATN. - I was concerned that diuretics would drop his blood pressures more with the associated venodilation and initiated CRRT on 1/13 to manage his volume status. Overnight did well on CRRT with net UF of 36ml/hr. He had output from the chest tube overnight. - Meds are already adjusted for  CRRT.  - Will change net UF to even for the next 24hrs and reassess in the AM.  2. PNA with exudative left sided effusion - currently still has a CT. 3. Paroxysmal Afib - On coumadin and amiodarone -  Sinus at time of exam. 4. Pulmonary HTN 5. Fungemia and stenotrophamonas sputum - On anidulafungin (no renal dosing), levaquin renally dosed. 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  Subjective:   Hypoglycemic episodes overnight +code and intubation not related to the CRRT (evening of 1/13-1/14).   Overnight had ~618ml out from the chest tube.  Still on Levophed ( )  Seen on CRRT. Pre/post/hd 500/300/1500ml/hr Qb111ml/min RIJ cath. UF - net 77ml/hr  --> d/w nurse and changing to net even for the next 24hrs.    Objective:   BP 86/68 mmHg  Pulse 89  Temp(Src) 98.1 F (36.7 C) (Axillary)  Resp 18  Ht 5\' 7"  (1.702 m)  Wt 60.7 kg (133 lb 13.1 oz)  BMI 20.95 kg/m2  SpO2 100%  Intake/Output Summary (Last 24 hours) at 10/11/15 0813 Last data filed at 10/11/15 0800  Gross per 24 hour  Intake 3165.63 ml  Output   5806 ml  Net -2640.37 ml   Weight change: -3.8 kg (-8 lb 6 oz)  Physical Exam: General appearance: alert and appears stated age Back: symmetric, no curvature. ROM normal. No CVA tenderness. Resp: Intubated Cardio: RRR GI: soft, non-tender; bowel sounds normal; no masses, no organomegaly Extremities: edema tr Pulses: 2+ and symmetric   Imaging: Dg Chest Port 1 View  23-Oct-2015  CLINICAL DATA:  Shortness of breath on exertion. Acute on chronic renal failure. Congestive heart failure. Atrial fibrillation. Pulmonary arterial hypertension. EXAM: PORTABLE CHEST 1 VIEW COMPARISON:  23-Oct-2015 FINDINGS: Support lines and tubes in appropriate  position including left ventricular assist device and left chest tube. No pneumothorax visualized. Cardiomegaly stable. Bilateral lower lung airspace opacity and bilateral pleural effusions show no significant change. IMPRESSION: No significant change in cardiomegaly, bilateral lower lung airspace opacity and pleural effusions. No pneumothorax visualized. Electronically Signed   By: JohnMyles RosenthalM.D.   On:  10/10/2015 11:00   Dg Chest Port 1 View  10/10/2015  CLINICAL DATA:  Endotracheal tube placement. Shortness of breath. Initial encounter. EXAM: PORTABLE CHEST 1 VIEW COMPARISON:  Chest radiograph from 09/27/2015 FINDINGS: The patient's endotracheal tube is seen ending 1-2 cm above the carina. This should be retracted 1-2 cm. An enteric tube is noted extending below the diaphragm. A right IJ line is noted ending about the right atrium. A left-sided chest tube is noted ending near the left lung apex. Small bilateral pleural effusions are seen. Patchy left-sided airspace opacification could reflect mild asymmetric interstitial edema. No pneumothorax is seen. The cardiomediastinal silhouette is enlarged. The patient is status post median sternotomy. A left ventricular assist device is noted. A pacemaker/AICD is noted overlying the left chest wall, with leads ending overlying the right atrium, right ventricle and coronary sinus. No acute osseous abnormalities are seen. IMPRESSION: 1. Endotracheal tube seen ending 1-2 cm above the carina. This should be retracted 1-2 cm, as deemed clinically appropriate. 2. Small bilateral pleural effusions noted. Patchy left-sided airspace opacification could reflect mild asymmetric interstitial edema. Cardiomegaly noted. Electronically Signed   By: Roanna Raider M.D.   On: 10/10/2015 02:07   Dg Chest Port 1 View  10/11/2015  CLINICAL DATA:  Dialysis. EXAM: PORTABLE CHEST 1 VIEW COMPARISON:  09/29/2015 . FINDINGS: Interim placement dialysis catheter. Is tip is at the cavoatrial junction. Right subclavian central line in stable position. Feeding tube and left chest tube in stable position. AICD and left ventricular assist device in stable position. Cardiomegaly with diffuse bilateral pulmonary alveolar infiltrates and pleural effusions consistent congestive heart failure . IMPRESSION: 1. Interim placement dialysis catheter, its tip is at the cavoatrial junction. Remaining lines and  tubes in stable position. No pneumothorax. 2. AICD and left ventricular assist device stable position. Persistent severe cardiomegaly. Diffuse bilateral pulmonary alveolar infiltrates and pleural effusions consistent with congestive heart failure. Electronically Signed   By: Maisie Fus  Register   On: 10/14/2015 14:45    Labs: BMET  Recent Labs Lab 10/08/15 0335 10/08/15 0530 10/08/15 1248 10/01/2015 0435 10/25/2015 1446 10/10/15 0413 10/10/15 0704 10/10/15 1550 10/11/15 0426  NA 143 143 142 142 141 142  --  137 137  K 6.7* 4.8 4.2 4.5 4.7 4.7  --  4.5 4.7  CL 111 111 108 109 106 110  --  105 104  CO2 24 23  --  22 21* 25  --  24 26  GLUCOSE 132* 135* 241* 117* 142* <20* 24* 110* 158*  BUN 63* 62* 67* 72* 74* 49*  --  34* 28*  CREATININE 3.75* 3.85* 3.70* 4.22* 3.78* 2.53*  --  1.73* 1.36*  CALCIUM 8.9 9.0  --  9.1 9.3 9.5  --  8.7* 8.9  PHOS  --   --   --   --  5.8* 3.9  --  2.3* 2.0*   CBC  Recent Labs Lab 10/03/2015 0435 10/05/2015 1446 10/10/15 0413 10/11/15 0428  WBC 11.5* 13.0* 13.3* 14.0*  HGB 8.1* 10.5* 9.5* 9.8*  HCT 24.0* 29.7* 27.8* 28.5*  MCV 86.6 83.4 85.5 85.6  PLT 841* 933* 811* 876*  Medications:    . anidulafungin  100 mg Intravenous Q24H  . antiseptic oral rinse  7 mL Mouth Rinse QID  . aspirin  81 mg Oral Daily  . ceFEPime (MAXIPIME) IV  2 g Intravenous Q12H  . chlorhexidine gluconate  15 mL Mouth Rinse BID  . docusate sodium  200 mg Oral Daily  . feeding supplement  237 mL Oral TID WC  . feeding supplement (PRO-STAT SUGAR FREE 64)  30 mL Per Tube BID  . furosemide  40 mg Intravenous Once  . insulin aspart  0-9 Units Subcutaneous 6 times per day  . levofloxacin (LEVAQUIN) IV  250 mg Intravenous Q24H  . pantoprazole sodium  40 mg Per Tube Daily  . sildenafil  40 mg Oral TID  . sodium chloride  10-40 mL Intracatheter Q12H  . vancomycin  750 mg Intravenous Q24H  . warfarin  2 mg Oral q1800  . Warfarin - Physician Dosing Inpatient   Does not apply  q1800      Paulene Floor, MD 10/11/2015, 8:13 AM

## 2015-10-11 NOTE — Progress Notes (Signed)
CT surgery HeartMate 2 Rounding Note POD #18  Heartmate 2 implantation   Subjective:   Class 4 CHF with cardiogenic shock, hx nonischemic    Cardiomyopathy Preop IABP Hx BPH, TURP and bladder outlet obstruction required cystoscopy to place foley\ probable UTI preop Preop severe protein loss malnutrition, prealbumin < 8 Pre and Post-op acute on chronic renal failure- required CVVH for postop acute HTN Preop guaiac + stool Preop chronic Eliquis for a-fib Postop acute on chronic anemia Postop fungemia Postop L pleural effusion requiring chest tube x2-  Postop acute respiratory failure requiring intubation   for apnea and possible mucous plugs   Patient with clinical deterioration starting postop day 12 , blood cultures positive for candida species. Pro calcitonin marker positive with clinical signs of sepsis. Antibiotic coverage has been increased. Patient has been replaced on pressors . Cultures have been negative since isolated candida species positive blood culture and patient was placed on antifungal drug Repeat left chest tube 28 French placed  with drainage 1 L of serosanguineous pleural effusion-cultures negative so far. Postop acute renal failure--renal consult placed-patient on CVVH via right IJ temporary dialysis catheter . HD has been effective in normalizing creatinine Echocardiogram  shows good unloading of the LV but with persistent moderate RV dysfunction  Patient much more alert and comfortable today Chest x-ray is almost completely clear now with drainage of left pleural effusion and hemodialysis Will reduce sedation and start patient on pressure support weaning Continue full tube feeds Hope to extubate in 24 hours      INR now  2.7- coumadin 2 mg  continue aspirin 81 mg VAD parameters satisfactory w isolated PI events  Currently on continuous tube feeds for protein malnutrition  LVAD INTERROGATION:  HeartMate II LVAD:  Flow 8.3 liters/min, speed 9000 rpm, power  5.2, PI 4.2  Controller intact  Objective:    Vital Signs:   Temp:  [96.7 F (35.9 C)-98.9 F (37.2 C)] 97.9 F (36.6 C) (01/15 0815) Pulse Rate:  [87-91] 90 (01/15 1130) Resp:  [16-24] 24 (01/15 1130) BP: (80-122)/(65-79) 99/68 mmHg (01/15 1130) SpO2:  [100 %] 100 % (01/15 1130) Arterial Line BP: (78-116)/(63-88) 99/71 mmHg (01/15 0900) FiO2 (%):  [40 %-50 %] 40 % (01/15 1130) Weight:  [133 lb 13.1 oz (60.7 kg)] 133 lb 13.1 oz (60.7 kg) (01/15 0600) Last BM Date: 10/10/15 Mean arterial Pressure 90-95  Intake/Output:   Intake/Output Summary (Last 24 hours) at 10/11/15 1217 Last data filed at 10/11/15 1200  Gross per 24 hour  Intake 2701.8 ml  Output   5614 ml  Net -2912.2 ml     Physical Exam: General:  Appears weak and fragile. Currently intubated and responsive and more alert HEENT: normal Neck: supple. no JVP  No carotid pulses; no bruits. No lymphadenopathy or thryomegaly appreciated. Cor: Mechanical heart sounds with LVAD hum present. Lungs: clear. Improved breath sounds after placement of left chest tube. Left chest tube drainage now minimal Abdomen: soft, nontender, nondistended. No hepatosplenomegaly. No bruits or masses. Good bowel sounds. Extremities: no cyanosis, clubbing, rash, mild- mod extremity  edema Neuro: alert & orientedx3, cranial nerves grossly intact.             Generally weak with poor ambulation ability after              developing fungemia followed by generalized sepsis  Telemetry: paced rhythm 90  Labs: Basic Metabolic Panel:  Recent Labs Lab 11/04/15 0435 2015/11/04 1446 10/10/15 0413 10/10/15 0704 10/10/15 1550  10/11/15 0426  NA 142 141 142  --  137 137  K 4.5 4.7 4.7  --  4.5 4.7  CL 109 106 110  --  105 104  CO2 22 21* 25  --  24 26  GLUCOSE 117* 142* <20* 24* 110* 158*  BUN 72* 74* 49*  --  34* 28*  CREATININE 4.22* 3.78* 2.53*  --  1.73* 1.36*  CALCIUM 9.1 9.3 9.5  --  8.7* 8.9  MG  --   --  2.3  --   --  2.2  PHOS  --   5.8* 3.9  --  2.3* 2.0*    Liver Function Tests:  Recent Labs Lab 10/26/2015 0435 10/25/2015 1446 10/10/15 0413 10/10/15 1550 10/11/15 0426  AST 52*  --  61*  --  70*  ALT 23  --  25  --  25  ALKPHOS 122  --  122  --  127*  BILITOT 0.5  --  1.0  --  1.0  PROT 6.0*  --  6.7  --  6.1*  ALBUMIN 3.3* 3.6 3.6 3.0* 3.1*   No results for input(s): LIPASE, AMYLASE in the last 168 hours. No results for input(s): AMMONIA in the last 168 hours.  CBC:  Recent Labs Lab 10/08/15 0335 10/08/15 1248 10/13/2015 0435 10/27/2015 1446 10/10/15 0413 10/11/15 0428  WBC 10.3  --  11.5* 13.0* 13.3* 14.0*  HGB 8.9* 9.5* 8.1* 10.5* 9.5* 9.8*  HCT 26.7* 28.0* 24.0* 29.7* 27.8* 28.5*  MCV 87.5  --  86.6 83.4 85.5 85.6  PLT 718*  --  841* 933* 811* 876*    INR:  Recent Labs Lab 10/07/15 0502 10/08/15 0430 10/02/2015 0435 10/10/15 0413 10/11/15 0426  INR 3.34* 2.68* 2.91* 2.54* 2.68*    Other results:  EKG:   Imaging: Dg Chest Port 1 View  10/11/2015  CLINICAL DATA:  LVAD follow-up EXAM: PORTABLE CHEST 1 VIEW COMPARISON:  Radiograph 10/10/2015 FINDINGS: LVAD device partially imaged. Endotracheal tube unchanged. LEFT chest tube in place without pneumothorax. RIGHT central venous line and NG tube unchanged. No pneumothorax or pulmonary edema present. There is improvement in pulmonary edema pattern seen on comparison exam. Improvement in bilateral pleural effusions seen on comparison exam. IMPRESSION: 1. Improvement in pleural effusions. 2. Improvement in vascular congestion / edema. 3. Stable support apparatus. Electronically Signed   By: Genevive Bi M.D.   On: 10/11/2015 10:22   Dg Chest Port 1 View  10/10/2015  CLINICAL DATA:  Shortness of breath on exertion. Acute on chronic renal failure. Congestive heart failure. Atrial fibrillation. Pulmonary arterial hypertension. EXAM: PORTABLE CHEST 1 VIEW COMPARISON:  10/10/2015 FINDINGS: Support lines and tubes in appropriate position including left  ventricular assist device and left chest tube. No pneumothorax visualized. Cardiomegaly stable. Bilateral lower lung airspace opacity and bilateral pleural effusions show no significant change. IMPRESSION: No significant change in cardiomegaly, bilateral lower lung airspace opacity and pleural effusions. No pneumothorax visualized. Electronically Signed   By: Myles Rosenthal M.D.   On: 10/10/2015 11:00   Dg Chest Port 1 View  10/10/2015  CLINICAL DATA:  Endotracheal tube placement. Shortness of breath. Initial encounter. EXAM: PORTABLE CHEST 1 VIEW COMPARISON:  Chest radiograph from 09/30/2015 FINDINGS: The patient's endotracheal tube is seen ending 1-2 cm above the carina. This should be retracted 1-2 cm. An enteric tube is noted extending below the diaphragm. A right IJ line is noted ending about the right atrium. A left-sided chest tube is noted ending near  the left lung apex. Small bilateral pleural effusions are seen. Patchy left-sided airspace opacification could reflect mild asymmetric interstitial edema. No pneumothorax is seen. The cardiomediastinal silhouette is enlarged. The patient is status post median sternotomy. A left ventricular assist device is noted. A pacemaker/AICD is noted overlying the left chest wall, with leads ending overlying the right atrium, right ventricle and coronary sinus. No acute osseous abnormalities are seen. IMPRESSION: 1. Endotracheal tube seen ending 1-2 cm above the carina. This should be retracted 1-2 cm, as deemed clinically appropriate. 2. Small bilateral pleural effusions noted. Patchy left-sided airspace opacification could reflect mild asymmetric interstitial edema. Cardiomegaly noted. Electronically Signed   By: Roanna Raider M.D.   On: 10/10/2015 02:07   Dg Chest Port 1 View  10/08/2015  CLINICAL DATA:  Dialysis. EXAM: PORTABLE CHEST 1 VIEW COMPARISON:  10/11/2015 . FINDINGS: Interim placement dialysis catheter. Is tip is at the cavoatrial junction. Right  subclavian central line in stable position. Feeding tube and left chest tube in stable position. AICD and left ventricular assist device in stable position. Cardiomegaly with diffuse bilateral pulmonary alveolar infiltrates and pleural effusions consistent congestive heart failure . IMPRESSION: 1. Interim placement dialysis catheter, its tip is at the cavoatrial junction. Remaining lines and tubes in stable position. No pneumothorax. 2. AICD and left ventricular assist device stable position. Persistent severe cardiomegaly. Diffuse bilateral pulmonary alveolar infiltrates and pleural effusions consistent with congestive heart failure. Electronically Signed   By: Maisie Fus  Register   On: 10/07/2015 14:45     Medications:     Scheduled Medications: . albumin human  12.5 g Intravenous Q6H  . anidulafungin  100 mg Intravenous Q24H  . antiseptic oral rinse  7 mL Mouth Rinse QID  . aspirin  81 mg Oral Daily  . ceFEPime (MAXIPIME) IV  2 g Intravenous Q12H  . chlorhexidine gluconate  15 mL Mouth Rinse BID  . docusate sodium  200 mg Oral Daily  . feeding supplement  237 mL Oral TID WC  . feeding supplement (PRO-STAT SUGAR FREE 64)  30 mL Per Tube BID  . furosemide  40 mg Intravenous Once  . insulin aspart  0-9 Units Subcutaneous 6 times per day  . levofloxacin (LEVAQUIN) IV  250 mg Intravenous Q24H  . pantoprazole sodium  40 mg Per Tube Daily  . sildenafil  40 mg Oral TID  . sodium chloride  10-40 mL Intracatheter Q12H  . vancomycin  750 mg Intravenous Q24H  . warfarin  2 mg Oral q1800  . Warfarin - Physician Dosing Inpatient   Does not apply q1800    Infusions: . sodium chloride Stopped (09/25/15 1100)  . sodium chloride    . calcium gluconate infusion for CRRT 20 g (10/11/15 1030)  . dexmedetomidine 0.1 mcg/kg/hr (10/11/15 0900)  . epinephrine 3 mcg/min (10/11/15 0700)  . feeding supplement (OSMOLITE 1.5 CAL) 1,000 mL (10/11/15 1039)  . lactated ringers Stopped (09/26/15 0000)  . lactated  ringers Stopped (09/24/15 0700)  . milrinone 0.25 mcg/kg/min (10/11/15 0700)  . norepinephrine (LEVOPHED) Adult infusion 8 mcg/min (10/11/15 0700)  . dialysis replacement fluid (prismasate) 300 mL/hr at 10/11/15 1030  . dialysate (PRISMASATE) 1,500 mL/hr at 10/11/15 1035  . sodium citrate 2 %/dextrose 2.5% solution 3000 mL 250 mL/hr at 10/11/15 1034    PRN Medications: sodium chloride, Place/Maintain arterial line **AND** sodium chloride, acetaminophen, albumin human, fentaNYL (SUBLIMAZE) injection, Gerhardt's butt cream, heparin, hydrALAZINE, ondansetron (ZOFRAN) IV, oxyCODONE, sodium chloride   Assessment:  VAD flow parameters  and function are satisfactory Echocardiogram performed earlier in week shows adequate RV function and good decompression of LV.  Patient's clinical condition however has deteriorated-  Started with Candida species in the  the blood and now with clinical severe sepsis and multi-system failure. I discussed the situation with the patient's wife and she understands the probable cause of his deterioration. She understands that the goal of both CVVH and mechanical ventilation are to provide support and the opportunity to clear his infection.  We'll continue continuous tube feeds to try to improve his nutritional status--currently with severe protein malnutrition.  Patient's uremia has almost cleared with CVVH and chest x-ray has improved significantly. Fever has resolved, mild leukocytosis persists ( WBC 14 K)   Plan/Discussion:      I reviewed the LVAD parameters from today, and compared the results to the patient's prior recorded data.  No programming changes were made.  The LVAD is functioning within specified parameters.  The patient performs LVAD self-test daily.  LVAD interrogation was negative for any significant power changes, alarms or PI events/speed drops.  LVAD equipment check completed and is in good working order.  Back-up equipment present.   LVAD  education done on emergency procedures and precautions and reviewed exit site care.  Length of Stay: 25  Brad [name] 10/11/2015, 12:17 PM

## 2015-10-11 NOTE — Progress Notes (Signed)
CT surgery p.m. Rounds  Patient weaned on pressure support all day--now placed on rest mode for slight increase in respirations  Input-output balance is a has been even are slightly positive, he is still on low-dose norepinephrine and pulsatile at the on A-line has increased--will increase pumps speed to 9200 and try to wean norepinephrine. Native urine output is 20-30 cc per hour  Blood sugars are rising again and split doses of Lantus has been added to his coverage every 4 hours

## 2015-10-11 NOTE — Progress Notes (Signed)
Patient ID: Brad [name], male   DOB: 05/10/1941, 75 y.o.   MRN: [id number]         [facility]  Total days of antibiotics 25 Day 11 anidulafungin Day 4 vancomycin Day 4 cefepime  Recent blood, urine and pleural fluid cultures are negative. Repeat sputum culture is pending. He has defervesced on his current antimicrobial regimen. Fungal susceptibilities on his fungal blood isolate are still pending.         [name], MD [facility] [phone] pager   [phone] cell 09/29/2015, 1:32 PM

## 2015-10-11 NOTE — Progress Notes (Signed)
Patient ID: Brad [name], male   DOB: 1941/05/20, 75 y.o.   MRN: [id number]    Advanced Heart Failure Rounding Note HeartMate 2 Rounding Note  Subjective:   Admitted from [facility] with cardiogenic shock for advanced heart failure as INTERMACS-1.  On 12/22 foley placed by Urology requiring urethral dilation -> UTI . ? Septic shock. Placed on vanco/zosyn. IABP placed 12/23. S/p HM 2 LVAD placement 08/28/2015.   Emergently intubated 1/13 for respiratory distress and obtundation. Remains on CVVHD. Tolerating well.  Abx broadened. Now on vanc, cefipime, levofloxacin and andulafungin.   On levophed 8 and epi 3. Sedated with precedex. CVP 10 . Awake. Follows commands. Seems more optimistic. Had good BM.    Blood Cx 09/29/15- 1/2 yeast ->speciation still pending -Lab sent to [facility] Urine Cx 09/29/15 - NG UA 09/29/15- with few bacteria  C. Diff 09/29/15 - Negative Resp Culture 09/25/16 - Negative. Resp Culture 09/30/15 -R are GNR/GPC -> STENOTROPHOMONAS MALTOPHILIA    LVAD INTERROGATION:  HeartMate II LVAD:  Flow 9.2 liters/min, speed 9000, power 7.2  , PI  5.0  Occasional PI events   Objective:    Vital Signs:   Temp:  [96.7 F (35.9 C)-98.9 F (37.2 C)] 97.9 F (36.6 C) (01/15 0815) Pulse Rate:  [87-91] 90 (01/15 1130) Resp:  [16-24] 24 (01/15 1130) BP: (80-122)/(65-79) 99/68 mmHg (01/15 1130) SpO2:  [100 %] 100 % (01/15 1130) Arterial Line BP: (78-116)/(63-88) 99/71 mmHg (01/15 0900) FiO2 (%):  [40 %-50 %] 40 % (01/15 1130) Weight:  [60.7 kg (133 lb 13.1 oz)] 60.7 kg (133 lb 13.1 oz) (01/15 0600) Last BM Date: 10/10/15 Mean arterial Pressure 80s  Intake/Output:   Intake/Output Summary (Last 24 hours) at 10/11/15 1253 Last data filed at 10/11/15 1200  Gross per 24 hour  Intake 2701.8 ml  Output   5614 ml  Net -2912.2 ml     Physical Exam: CVP ~10 General: intubated. Awake follows commands HEENT: normal x for panda and ETT Neck: supple.  RIJ trialysis cath Carotids 2+ bilat;  no bruits. No thyromegaly or nodule noted.  Cor: PMI nondisplaced. RRR. LVAD hum present.  Lungs: Decreased bases bilaterally. Abdomen: soft, NT, mildly distended , no HSM. No bruits or masses. Hypoactive BS Extremities: no cyanosis, clubbing, rash. RUE PICC  Rand LLE 1+ edema into thighs Neuro: intubated but awakens and follows comamnds moves all 4 extremities w/o difficulty GU: Foley  Telemetry: reviewed personally, a sensed v pacing 90s   Labs: Basic Metabolic Panel:  Recent Labs Lab 09/27/2015 0435 10/21/2015 1446 10/10/15 0413 10/10/15 0704 10/10/15 1550 10/11/15 0426 10/11/15 1236 10/11/15 1241  NA 142 141 142  --  137 137 140 138  K 4.5 4.7 4.7  --  4.5 4.7 4.6 4.3  CL 109 106 110  --  105 104 97* 100*  CO2 22 21* 25  --  24 26  --   --   GLUCOSE 117* 142* <20* 24* 110* 158* 229* 212*  BUN 72* 74* 49*  --  34* 28* 25* 24*  CREATININE 4.22* 3.78* 2.53*  --  1.73* 1.36* 0.90 1.10  CALCIUM 9.1 9.3 9.5  --  8.7* 8.9  --   --   MG  --   --  2.3  --   --  2.2  --   --   PHOS  --  5.8* 3.9  --  2.3* 2.0*  --   --     Liver Function Tests:  Recent  Labs Lab 10/28/15 0435 10-28-15 1446 10/10/15 0413 10/10/15 1550 10/11/15 0426  AST 52*  --  61*  --  70*  ALT 23  --  25  --  25  ALKPHOS 122  --  122  --  127*  BILITOT 0.5  --  1.0  --  1.0  PROT 6.0*  --  6.7  --  6.1*  ALBUMIN 3.3* 3.6 3.6 3.0* 3.1*   No results for input(s): LIPASE, AMYLASE in the last 168 hours. No results for input(s): AMMONIA in the last 168 hours.  CBC:  Recent Labs Lab 10/08/15 0335  10/28/15 0435 2015-10-28 1446 10/10/15 0413 10/11/15 0428 10/11/15 1236 10/11/15 1241  WBC 10.3  --  11.5* 13.0* 13.3* 14.0*  --   --   HGB 8.9*  < > 8.1* 10.5* 9.5* 9.8* 11.9* 10.9*  HCT 26.7*  < > 24.0* 29.7* 27.8* 28.5* 35.0* 32.0*  MCV 87.5  --  86.6 83.4 85.5 85.6  --   --   PLT 718*  --  841* 933* 811* 876*  --   --   < > = values in this interval not displayed.  INR:  Recent Labs Lab  10/07/15 0502 10/08/15 0430 Oct 28, 2015 0435 10/10/15 0413 10/11/15 0426  INR 3.34* 2.68* 2.91* 2.54* 2.68*    Other results:    Imaging: Dg Chest Port 1 View  10/11/2015  CLINICAL DATA:  LVAD follow-up EXAM: PORTABLE CHEST 1 VIEW COMPARISON:  Radiograph 10/10/2015 FINDINGS: LVAD device partially imaged. Endotracheal tube unchanged. LEFT chest tube in place without pneumothorax. RIGHT central venous line and NG tube unchanged. No pneumothorax or pulmonary edema present. There is improvement in pulmonary edema pattern seen on comparison exam. Improvement in bilateral pleural effusions seen on comparison exam. IMPRESSION: 1. Improvement in pleural effusions. 2. Improvement in vascular congestion / edema. 3. Stable support apparatus. Electronically Signed   By: Genevive Bi M.D.   On: 10/11/2015 10:22   Dg Chest Port 1 View  10/10/2015  CLINICAL DATA:  Shortness of breath on exertion. Acute on chronic renal failure. Congestive heart failure. Atrial fibrillation. Pulmonary arterial hypertension. EXAM: PORTABLE CHEST 1 VIEW COMPARISON:  10/10/2015 FINDINGS: Support lines and tubes in appropriate position including left ventricular assist device and left chest tube. No pneumothorax visualized. Cardiomegaly stable. Bilateral lower lung airspace opacity and bilateral pleural effusions show no significant change. IMPRESSION: No significant change in cardiomegaly, bilateral lower lung airspace opacity and pleural effusions. No pneumothorax visualized. Electronically Signed   By: Myles Rosenthal M.D.   On: 10/10/2015 11:00   Dg Chest Port 1 View  10/10/2015  CLINICAL DATA:  Endotracheal tube placement. Shortness of breath. Initial encounter. EXAM: PORTABLE CHEST 1 VIEW COMPARISON:  Chest radiograph from 10/28/2015 FINDINGS: The patient's endotracheal tube is seen ending 1-2 cm above the carina. This should be retracted 1-2 cm. An enteric tube is noted extending below the diaphragm. A right IJ line is noted  ending about the right atrium. A left-sided chest tube is noted ending near the left lung apex. Small bilateral pleural effusions are seen. Patchy left-sided airspace opacification could reflect mild asymmetric interstitial edema. No pneumothorax is seen. The cardiomediastinal silhouette is enlarged. The patient is status post median sternotomy. A left ventricular assist device is noted. A pacemaker/AICD is noted overlying the left chest wall, with leads ending overlying the right atrium, right ventricle and coronary sinus. No acute osseous abnormalities are seen. IMPRESSION: 1. Endotracheal tube seen ending 1-2 cm above the  carina. This should be retracted 1-2 cm, as deemed clinically appropriate. 2. Small bilateral pleural effusions noted. Patchy left-sided airspace opacification could reflect mild asymmetric interstitial edema. Cardiomegaly noted. Electronically Signed   By: Roanna Raider M.D.   On: 10/10/2015 02:07   Dg Chest Port 1 View  2015-10-14  CLINICAL DATA:  Dialysis. EXAM: PORTABLE CHEST 1 VIEW COMPARISON:  14-Oct-2015 . FINDINGS: Interim placement dialysis catheter. Is tip is at the cavoatrial junction. Right subclavian central line in stable position. Feeding tube and left chest tube in stable position. AICD and left ventricular assist device in stable position. Cardiomegaly with diffuse bilateral pulmonary alveolar infiltrates and pleural effusions consistent congestive heart failure . IMPRESSION: 1. Interim placement dialysis catheter, its tip is at the cavoatrial junction. Remaining lines and tubes in stable position. No pneumothorax. 2. AICD and left ventricular assist device stable position. Persistent severe cardiomegaly. Diffuse bilateral pulmonary alveolar infiltrates and pleural effusions consistent with congestive heart failure. Electronically Signed   By: Maisie Fus  Register   On: 10/14/15 14:45     Medications:     Scheduled Medications: . albumin human  12.5 g Intravenous Q6H    . anidulafungin  100 mg Intravenous Q24H  . antiseptic oral rinse  7 mL Mouth Rinse QID  . aspirin  81 mg Oral Daily  . ceFEPime (MAXIPIME) IV  2 g Intravenous Q12H  . chlorhexidine gluconate  15 mL Mouth Rinse BID  . docusate sodium  200 mg Oral Daily  . feeding supplement  237 mL Oral TID WC  . feeding supplement (PRO-STAT SUGAR FREE 64)  30 mL Per Tube BID  . furosemide  40 mg Intravenous Once  . insulin aspart  0-9 Units Subcutaneous 6 times per day  . levofloxacin (LEVAQUIN) IV  250 mg Intravenous Q24H  . pantoprazole sodium  40 mg Per Tube Daily  . sildenafil  40 mg Oral TID  . sodium chloride  10-40 mL Intracatheter Q12H  . vancomycin  750 mg Intravenous Q24H  . warfarin  2 mg Oral q1800  . Warfarin - Physician Dosing Inpatient   Does not apply q1800    Infusions: . sodium chloride Stopped (09/25/15 1100)  . sodium chloride    . calcium gluconate infusion for CRRT 20 g (10/11/15 1030)  . dexmedetomidine 0.1 mcg/kg/hr (10/11/15 0900)  . epinephrine 3 mcg/min (10/11/15 0700)  . feeding supplement (OSMOLITE 1.5 CAL) 1,000 mL (10/11/15 1039)  . lactated ringers Stopped (09/26/15 0000)  . lactated ringers Stopped (09/24/15 0700)  . milrinone 0.25 mcg/kg/min (10/11/15 0700)  . norepinephrine (LEVOPHED) Adult infusion 8 mcg/min (10/11/15 0700)  . dialysis replacement fluid (prismasate) 300 mL/hr at 10/11/15 1030  . dialysate (PRISMASATE) 1,500 mL/hr at 10/11/15 1035  . sodium citrate 2 %/dextrose 2.5% solution 3000 mL 250 mL/hr at 10/11/15 1034    PRN Medications: sodium chloride, Place/Maintain arterial line **AND** sodium chloride, acetaminophen, albumin human, fentaNYL (SUBLIMAZE) injection, Gerhardt's butt cream, heparin, hydrALAZINE, ondansetron (ZOFRAN) IV, oxyCODONE, sodium chloride   Assessment/Plan   1. Acute on chronic systolic HF -> Cardiogenic shock: NICM with EF 5%, mild to moderate RV hypokinesis.  S/p St Jude CRT-D. s/p HM II LVAD placement 09/10/2015.  -  Improving with CVVHD.   -Remains on levophed, epi and milrinone. Wean slowly as tolerated. D/w Dr. Donata Clay.  2. Acute respiratory failure -intubated 1/13 for respiratory distress. S/p left chest tube for effusion. On vanc, cefipime and levofloxacin. Weaning now. Hopefully can extubate tomorrow.  3. Septic shock:  Now with fungemia and stenotrophamonas in sputum -  On vanc, cefipime, levofloxacin and andulafungin  - Wean pressors as tolerated 4. Hypoglycemia -improved 5. Acute on chronic renal failure, stage V -due to ATN. On CVVHD 6. Atrial fibrillation: Paroxysmal.    - Maintaining sinus  - On coumadin and po amiodarone. 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 Urology consulted before LVAD with foley placed. Foley was removed 10/05/15. Urine culture sent. Urology placed foley 1/11.   8. Anemia- Received 1 UPRBCs on 10/07/15. Todays Hgb 8.1. Receiving another unit of blood.   9. L Pleural Effusion- S/P chest tube. Output slowing.    10. Anticoagulation -Continue ASA and couamdin  The patient is critically ill with multiple organ systems failure and requires high complexity decision making for assessment and support, frequent evaluation and titration of therapies, application of advanced monitoring technologies and extensive interpretation of multiple databases.   Critical Care Time devoted to patient care services described in this note is 35 Minutes. I reviewed the LVAD parameters from today, and compared the results to the patient's prior recorded data.  No programming changes were made.  The LVAD is functioning within specified parameters. LVAD interrogation was negative for any significant power changes, alarms or PI events/speed drops.  LVAD equipment check completed and is in good working order.  Back-up equipment present.   Length of Stay: 25  Arvilla Meres MD  10/11/2015, 12:53 PM  VAD Team --- VAD ISSUES ONLY--- Pager [phone] (7am - 7am)

## 2015-10-12 ENCOUNTER — Inpatient Hospital Stay (HOSPITAL_COMMUNITY): Payer: Medicare Other

## 2015-10-12 DIAGNOSIS — J984 Other disorders of lung: Secondary | ICD-10-CM

## 2015-10-12 LAB — POCT ACTIVATED CLOTTING TIME
ACTIVATED CLOTTING TIME: 198 s
ACTIVATED CLOTTING TIME: 224 s
Activated Clotting Time: 214 seconds
Activated Clotting Time: 224 seconds
Activated Clotting Time: 219 seconds
Activated Clotting Time: 219 seconds
Activated Clotting Time: 219 seconds
Activated Clotting Time: 224 seconds
Activated Clotting Time: 219 seconds

## 2015-10-12 LAB — POCT I-STAT, CHEM 8
BUN: 18 mg/dL (ref 6–20)
BUN: 21 mg/dL — AB (ref 6–20)
BUN: 18 mg/dL (ref 6–20)
BUN: 21 mg/dL — ABNORMAL HIGH (ref 6–20)
BUN: 18 mg/dL (ref 6–20)
BUN: 20 mg/dL (ref 6–20)
CALCIUM ION: 0.54 mmol/L — AB (ref 1.13–1.30)
CALCIUM ION: 1.11 mmol/L — AB (ref 1.13–1.30)
CALCIUM ION: 1.11 mmol/L — AB (ref 1.13–1.30)
CALCIUM ION: 0.49 mmol/L — AB (ref 1.13–1.30)
CALCIUM ION: 1.08 mmol/L — AB (ref 1.13–1.30)
CHLORIDE: 86 mmol/L — AB (ref 101–111)
CREATININE: 0.8 mg/dL (ref 0.61–1.24)
CREATININE: 1 mg/dL (ref 0.61–1.24)
CREATININE: 0.9 mg/dL (ref 0.61–1.24)
CREATININE: 1 mg/dL (ref 0.61–1.24)
CREATININE: 0.9 mg/dL (ref 0.61–1.24)
CREATININE: 1 mg/dL (ref 0.61–1.24)
Calcium, Ion: 0.5 mmol/L — CL (ref 1.13–1.30)
Chloride: 88 mmol/L — ABNORMAL LOW (ref 101–111)
Chloride: 91 mmol/L — ABNORMAL LOW (ref 101–111)
Chloride: 86 mmol/L — ABNORMAL LOW (ref 101–111)
Chloride: 91 mmol/L — ABNORMAL LOW (ref 101–111)
Chloride: 89 mmol/L — ABNORMAL LOW (ref 101–111)
GLUCOSE: 121 mg/dL — AB (ref 65–99)
GLUCOSE: 181 mg/dL — AB (ref 65–99)
GLUCOSE: 233 mg/dL — AB (ref 65–99)
GLUCOSE: 250 mg/dL — AB (ref 65–99)
GLUCOSE: 287 mg/dL — AB (ref 65–99)
Glucose, Bld: 177 mg/dL — ABNORMAL HIGH (ref 65–99)
HCT: 31 % — ABNORMAL LOW (ref 39.0–52.0)
HCT: 33 % — ABNORMAL LOW (ref 39.0–52.0)
HCT: 31 % — ABNORMAL LOW (ref 39.0–52.0)
HCT: 33 % — ABNORMAL LOW (ref 39.0–52.0)
HEMATOCRIT: 33 % — AB (ref 39.0–52.0)
HEMATOCRIT: 29 % — AB (ref 39.0–52.0)
HEMOGLOBIN: 11.2 g/dL — AB (ref 13.0–17.0)
HEMOGLOBIN: 9.9 g/dL — AB (ref 13.0–17.0)
HEMOGLOBIN: 10.5 g/dL — AB (ref 13.0–17.0)
Hemoglobin: 10.5 g/dL — ABNORMAL LOW (ref 13.0–17.0)
Hemoglobin: 11.2 g/dL — ABNORMAL LOW (ref 13.0–17.0)
Hemoglobin: 11.2 g/dL — ABNORMAL LOW (ref 13.0–17.0)
Potassium: 3.6 mmol/L (ref 3.5–5.1)
Potassium: 3.7 mmol/L (ref 3.5–5.1)
Potassium: 3.5 mmol/L (ref 3.5–5.1)
Potassium: 3.5 mmol/L (ref 3.5–5.1)
Potassium: 3.7 mmol/L (ref 3.5–5.1)
Potassium: 3.7 mmol/L (ref 3.5–5.1)
SODIUM: 139 mmol/L (ref 135–145)
SODIUM: 141 mmol/L (ref 135–145)
Sodium: 140 mmol/L (ref 135–145)
Sodium: 138 mmol/L (ref 135–145)
Sodium: 140 mmol/L (ref 135–145)
Sodium: 138 mmol/L (ref 135–145)
TCO2: 36 mmol/L (ref 0–100)
TCO2: 36 mmol/L (ref 0–100)
TCO2: 37 mmol/L (ref 0–100)
TCO2: 38 mmol/L (ref 0–100)
TCO2: 37 mmol/L (ref 0–100)
TCO2: 39 mmol/L (ref 0–100)

## 2015-10-12 LAB — POCT I-STAT 3, ART BLOOD GAS (G3+)
ACID-BASE EXCESS: 15 mmol/L — AB (ref 0.0–2.0)
ACID-BASE EXCESS: 18 mmol/L — AB (ref 0.0–2.0)
ACID-BASE EXCESS: 18 mmol/L — AB (ref 0.0–2.0)
Bicarbonate: 39 mEq/L — ABNORMAL HIGH (ref 20.0–24.0)
Bicarbonate: 42.5 mEq/L — ABNORMAL HIGH (ref 20.0–24.0)
Bicarbonate: 43.6 mEq/L — ABNORMAL HIGH (ref 20.0–24.0)
O2 SAT: 100 %
O2 SAT: 100 %
O2 SAT: 99 %
PCO2 ART: 55.9 mmHg — AB (ref 35.0–45.0)
PH ART: 7.552 — AB (ref 7.350–7.450)
PO2 ART: 169 mmHg — AB (ref 80.0–100.0)
Patient temperature: 98.6
TCO2: 40 mmol/L (ref 0–100)
TCO2: 44 mmol/L (ref 0–100)
TCO2: 45 mmol/L (ref 0–100)
pCO2 arterial: 46.2 mmHg — ABNORMAL HIGH (ref 35.0–45.0)
pCO2 arterial: 48.4 mmHg — ABNORMAL HIGH (ref 35.0–45.0)
pH, Arterial: 7.535 — ABNORMAL HIGH (ref 7.350–7.450)
pH, Arterial: 7.5 — ABNORMAL HIGH (ref 7.350–7.450)
pO2, Arterial: 154 mmHg — ABNORMAL HIGH (ref 80.0–100.0)
pO2, Arterial: 153 mmHg — ABNORMAL HIGH (ref 80.0–100.0)

## 2015-10-12 LAB — GLUCOSE, CAPILLARY
GLUCOSE-CAPILLARY: 153 mg/dL — AB (ref 65–99)
GLUCOSE-CAPILLARY: 120 mg/dL — AB (ref 65–99)
Glucose-Capillary: 139 mg/dL — ABNORMAL HIGH (ref 65–99)
Glucose-Capillary: 118 mg/dL — ABNORMAL HIGH (ref 65–99)

## 2015-10-12 LAB — COMPREHENSIVE METABOLIC PANEL
ALT: 28 U/L (ref 17–63)
AST: 79 U/L — ABNORMAL HIGH (ref 15–41)
Albumin: 3.2 g/dL — ABNORMAL LOW (ref 3.5–5.0)
Alkaline Phosphatase: 138 U/L — ABNORMAL HIGH (ref 38–126)
Anion gap: 9 (ref 5–15)
BUN: 19 mg/dL (ref 6–20)
CO2: 36 mmol/L — ABNORMAL HIGH (ref 22–32)
Calcium: 10.1 mg/dL (ref 8.9–10.3)
Chloride: 93 mmol/L — ABNORMAL LOW (ref 101–111)
Creatinine, Ser: 1.09 mg/dL (ref 0.61–1.24)
GFR calc Af Amer: >60 mL/min (ref >60)
GFR calc non Af Amer: >60 mL/min (ref >60)
Glucose, Bld: 180 mg/dL — ABNORMAL HIGH (ref 65–99)
Potassium: 3.6 mmol/L (ref 3.5–5.1)
Sodium: 138 mmol/L (ref 135–145)
Total Bilirubin: 0.9 mg/dL (ref 0.3–1.2)
Total Protein: 5.7 g/dL — ABNORMAL LOW (ref 6.5–8.1)

## 2015-10-12 LAB — CBC
HCT: 27.4 % — ABNORMAL LOW (ref 39.0–52.0)
Hemoglobin: 9.3 g/dL — ABNORMAL LOW (ref 13.0–17.0)
MCH: 29.1 pg (ref 26.0–34.0)
MCHC: 33.9 g/dL (ref 30.0–36.0)
MCV: 85.6 fL (ref 78.0–100.0)
Platelets: 1017 10*3/uL (ref 150–400)
RBC: 3.2 MIL/uL — ABNORMAL LOW (ref 4.22–5.81)
RDW: 16.8 % — ABNORMAL HIGH (ref 11.5–15.5)
WBC: 12.9 10*3/uL — ABNORMAL HIGH (ref 4.0–10.5)

## 2015-10-12 LAB — RENAL FUNCTION PANEL
ALBUMIN: 3.3 g/dL — AB (ref 3.5–5.0)
Anion gap: 10 (ref 5–15)
BUN: 20 mg/dL (ref 6–20)
CHLORIDE: 94 mmol/L — AB (ref 101–111)
CO2: 37 mmol/L — ABNORMAL HIGH (ref 22–32)
Calcium: 9.7 mg/dL (ref 8.9–10.3)
Creatinine, Ser: 1.15 mg/dL (ref 0.61–1.24)
GFR calc Af Amer: >60 mL/min (ref >60)
GLUCOSE: 127 mg/dL — AB (ref 65–99)
Phosphorus: 1.4 mg/dL — ABNORMAL LOW (ref 2.5–4.6)
Potassium: 3.6 mmol/L (ref 3.5–5.1)
Sodium: 141 mmol/L (ref 135–145)

## 2015-10-12 LAB — CARBOXYHEMOGLOBIN
Carboxyhemoglobin: 1.8 % — ABNORMAL HIGH (ref 0.5–1.5)
Methemoglobin: 0.9 % (ref 0.0–1.5)
O2 Saturation: 77.3 %
Total hemoglobin: 9.7 g/dL — ABNORMAL LOW (ref 13.5–18.0)

## 2015-10-12 LAB — PROTIME-INR
INR: 2.21 — ABNORMAL HIGH (ref 0.00–1.49)
Prothrombin Time: 24.3 seconds — ABNORMAL HIGH (ref 11.6–15.2)

## 2015-10-12 LAB — APTT: aPTT: 109 seconds — ABNORMAL HIGH (ref 24–37)

## 2015-10-12 LAB — MAGNESIUM: MAGNESIUM: 2.1 mg/dL (ref 1.7–2.4)

## 2015-10-12 LAB — LACTATE DEHYDROGENASE: LDH: 362 U/L — ABNORMAL HIGH (ref 98–192)

## 2015-10-12 MED ORDER — SODIUM CHLORIDE 0.9 % FOR CRRT
INTRAVENOUS_CENTRAL | Status: DC | PRN
  Filled 2015-10-12: qty 1000

## 2015-10-12 MED ORDER — INSULIN GLARGINE 100 UNIT/ML ~~LOC~~ SOLN
18.0000 [IU] | Freq: Two times a day (BID) | SUBCUTANEOUS | Status: DC
  Administered 2015-10-12 (×2): 18 [IU] via SUBCUTANEOUS
  Filled 2015-10-12 (×3): qty 0.18

## 2015-10-12 MED ORDER — ANTISEPTIC ORAL RINSE SOLUTION (CORINZ)
7.0000 mL | OROMUCOSAL | Status: DC
  Administered 2015-10-12 – 2015-10-13 (×10): 7 mL via OROMUCOSAL

## 2015-10-12 MED ORDER — PRISMASOL BGK 4/2.5 32-4-2.5 MEQ/L IV SOLN
INTRAVENOUS | Status: DC
  Administered 2015-10-12 – 2015-10-14 (×2): via INTRAVENOUS_CENTRAL
  Filled 2015-10-12 (×4): qty 5000

## 2015-10-12 MED ORDER — HEPARIN SODIUM (PORCINE) 1000 UNIT/ML DIALYSIS
1000.0000 [IU] | INTRAMUSCULAR | Status: DC | PRN
  Filled 2015-10-12: qty 6

## 2015-10-12 MED ORDER — PRISMASOL BGK 4/2.5 32-4-2.5 MEQ/L IV SOLN
INTRAVENOUS | Status: DC
  Administered 2015-10-12 – 2015-10-14 (×4): via INTRAVENOUS_CENTRAL
  Filled 2015-10-12 (×5): qty 5000

## 2015-10-12 MED ORDER — SODIUM CHLORIDE 0.9 % IJ SOLN
250.0000 [IU]/h | INTRAMUSCULAR | Status: DC
  Administered 2015-10-12: 850 [IU]/h via INTRAVENOUS_CENTRAL
  Administered 2015-10-13 – 2015-10-14 (×3): 800 [IU]/h via INTRAVENOUS_CENTRAL
  Filled 2015-10-12 (×5): qty 2

## 2015-10-12 MED ORDER — HEPARIN BOLUS VIA INFUSION (CRRT)
1000.0000 [IU] | INTRAVENOUS | Status: DC | PRN
  Filled 2015-10-12: qty 1000

## 2015-10-12 MED ORDER — ALBUMIN HUMAN 25 % IV SOLN
12.5000 g | Freq: Four times a day (QID) | INTRAVENOUS | Status: AC
  Administered 2015-10-12 – 2015-10-14 (×10): 12.5 g via INTRAVENOUS
  Filled 2015-10-12 (×17): qty 50

## 2015-10-12 MED ORDER — WARFARIN SODIUM 3 MG PO TABS
3.0000 mg | ORAL_TABLET | Freq: Every day | ORAL | Status: DC
  Administered 2015-10-12 – 2015-10-13 (×2): 3 mg via ORAL
  Filled 2015-10-12 (×2): qty 1

## 2015-10-12 MED ORDER — CHLORHEXIDINE GLUCONATE 0.12 % MT SOLN
OROMUCOSAL | Status: AC
  Administered 2015-10-12: 15 mL via OROMUCOSAL
  Filled 2015-10-12: qty 15

## 2015-10-12 MED ORDER — PRISMASOL BGK 4/2.5 32-4-2.5 MEQ/L IV SOLN
INTRAVENOUS | Status: DC
  Administered 2015-10-12 – 2015-10-14 (×12): via INTRAVENOUS_CENTRAL
  Filled 2015-10-12 (×18): qty 5000

## 2015-10-12 MED ORDER — CHLORHEXIDINE GLUCONATE 0.12% ORAL RINSE (MEDLINE KIT)
15.0000 mL | Freq: Two times a day (BID) | OROMUCOSAL | Status: DC
  Administered 2015-10-12 – 2015-10-13 (×3): 15 mL via OROMUCOSAL

## 2015-10-12 NOTE — Plan of Care (Signed)
Problem: Fluid Volume: Goal: Risk for excess fluid volume will decrease Outcome: Progressing Trying to keep even for now. Able to pull off small amounts occ. Still having occasional PI events.   Problem: Respiratory: Goal: Ability to maintain adequate ventilation will improve Outcome: Progressing Was able to wean for a few hours today. Bicarb levels high. Will attempt to extubate tomorrow.

## 2015-10-12 NOTE — Care Management Important Message (Signed)
Important Message  Patient Details  Name: Brad [name] MRN: [id number] Date of Birth: 05-Feb-1941   Medicare Important Message Given:  Yes    Bernadette Hoit 10/12/2015, 12:34 PM

## 2015-10-12 NOTE — Progress Notes (Signed)
Utilization review completed.  

## 2015-10-12 NOTE — Progress Notes (Signed)
[facility]   Reason for visit: Follow up on fungemia, pneumonia  Interval History: intubated 1/13, remains on vent.  No new positive cultures.  Candida sensitivities still pending.    CXR independently reviewed and less edema  Physical Exam: Constitutional: awake on vent Filed Vitals:   10/12/15 0800 10/12/15 0806  BP: 99/68   Pulse: 104   Temp:  98.6 F (37 C)  Resp: 19    patient appears in NAD Eyes: anicteric HENT: no thrush Respiratory: on vent Cardiovascular: LVAD GI: soft, nt, nd Ext: bilateral arm edema  Review of Systems: Gastrointestinal: some loose stool, on tube feeds Patient nods no complaints, non-verbal  Lab Results  Component Value Date   WBC 12.9* 10/12/2015   HGB 11.2* 10/12/2015   HCT 33.0* 10/12/2015   MCV 85.6 10/12/2015   PLT 1017* 10/12/2015    Lab Results  Component Value Date   CREATININE 0.90 10/12/2015   BUN 18 10/12/2015   NA 141 10/12/2015   K 3.7 10/12/2015   CL 86* 10/12/2015   CO2 36* 10/12/2015    Lab Results  Component Value Date   ALT 28 10/12/2015   AST 79* 10/12/2015   ALKPHOS 138* 10/12/2015     Microbiology: Recent Results (from the past 240 hour(s))  Culture, blood (single)     Status: None   Collection Time: 10/05/15 10:32 AM  Result Value Ref Range Status   Specimen Description BLOOD CENTRAL LINE  Final   Special Requests BOTTLES DRAWN AEROBIC AND ANAEROBIC 5CC  Final   Culture NO GROWTH 5 DAYS  Final   Report Status 10/10/2015 FINAL  Final  Culture, Urine     Status: None   Collection Time: 10/06/15  4:30 PM  Result Value Ref Range Status   Specimen Description URINE, CLEAN CATCH  Final   Special Requests Levaquin, anidulafungin  Final   Culture NO GROWTH 1 DAY  Final   Report Status 10/07/2015 FINAL  Final  Urine culture     Status: None   Collection Time: 10/07/15  4:25 PM  Result Value Ref Range Status   Specimen Description URINE, CATHETERIZED  Final   Special  Requests NONE  Final   Culture NO GROWTH 1 DAY  Final   Report Status 10/08/2015 FINAL  Final  Body fluid culture     Status: None   Collection Time: 10/08/15  9:20 AM  Result Value Ref Range Status   Specimen Description FLUID LEFT PLEURAL  Final   Special Requests NONE  Final   Gram Stain   Final    ABUNDANT WBC PRESENT,BOTH PMN AND MONONUCLEAR NO ORGANISMS SEEN    Culture NO GROWTH 3 DAYS  Final   Report Status 10/11/2015 FINAL  Final  Culture, blood (routine x 2)     Status: None (Preliminary result)   Collection Time: 10/08/15 10:20 AM  Result Value Ref Range Status   Specimen Description BLOOD RIGHT HAND  Final   Special Requests BOTTLES DRAWN AEROBIC AND ANAEROBIC 10CC  Final   Culture NO GROWTH 3 DAYS  Final   Report Status PENDING  Incomplete  Culture, blood (routine x 2)     Status: None (Preliminary result)   Collection Time: 10/08/15 10:31 AM  Result Value Ref Range Status   Specimen Description BLOOD LEFT HAND  Final   Special Requests BOTTLES DRAWN AEROBIC AND ANAEROBIC 10CC  Final   Culture NO GROWTH 3 DAYS  Final   Report  Status PENDING  Incomplete  Culture, Urine     Status: None   Collection Time: 10/08/15 12:10 PM  Result Value Ref Range Status   Specimen Description URINE, CATHETERIZED  Final   Special Requests Immunocompromised  Final   Culture NO GROWTH 1 DAY  Final   Report Status 10/22/2015 FINAL  Final  Culture, respiratory (NON-Expectorated)     Status: None (Preliminary result)   Collection Time: 10/10/15  5:43 AM  Result Value Ref Range Status   Specimen Description TRACHEAL ASPIRATE  Final   Special Requests Normal  Final   Gram Stain   Final    MODERATE WBC PRESENT, PREDOMINANTLY PMN RARE SQUAMOUS EPITHELIAL CELLS PRESENT NO ORGANISMS SEEN Performed at [facility]    Culture PENDING  Incomplete   Report Status PENDING  Incomplete    Impression/Plan:  1. Fungemia - repeat cultures remain negative.  Sensitivities pending so will  continue with anidulafungin.  2.  Fever - last fever 1/13.  No new positive cultures.  Will stop vancomycin.   3.  Possible pneumonia - sputum culture normal flora. CXR improved, WBC improved.  Unclear if cause of of #2.  Previous Stenotrophomonas, treated for 9 days.

## 2015-10-12 NOTE — Progress Notes (Signed)
CSW met with wife at bedside. Patient on vent although able to nod appropriately to CSW. Wife reports hopes for patient to be extubated today although still pending at the moment. Wife tearful about the decline and need for CVVH and intubation. She states "it seems like things start to look good and then we take two steps backwards". Wife states that she spent the weekend locally to be available and closer to patient. She said her son visited this weekend and found it hard to see his father intubated in ICU. Patient's son unable to visit during the week due to his work schedule but wife remains in contact via phone. CSW provided supportive intervention and will continue to follow for support throughout implant hospitalization. Raquel Sarna, [place]

## 2015-10-12 NOTE — Plan of Care (Signed)
Problem: Respiratory: Goal: Ability to maintain adequate ventilation will improve Outcome: Not Met (add Reason) 10-10-15: Pt obtunded and required respiratory support including intubation. 10-11-15: Pt tolerated PSV weaning all day, rested on PRVC during night. Tolerated both without difficulty.

## 2015-10-12 NOTE — Progress Notes (Signed)
Rehab admissions - Noted patient on vent and change in status.  Not appropriate for inpatient rehab at this time.  Please reconsult if the patient should become appropriate again over the next several days.  Call me for questions.  #[phone]

## 2015-10-12 NOTE — Progress Notes (Signed)
OT Cancellation Note  Patient Details Name: Brad [name] MRN: [id number] DOB: [birth year]   Cancelled Treatment:    Reason Eval/Treat Not Completed: Medical issues which prohibited therapy. Pt on vent. Pressors. Will check on pt tomorrow.   [facility] [name], OTR/L  [phone] 10/12/2015 10/12/2015, 8:40 AM

## 2015-10-12 NOTE — Progress Notes (Signed)
CT surgery HeartMate 2 Rounding Note POD #19  Heartmate 2 implantation   Subjective:   Class 4 CHF with cardiogenic shock, hx nonischemic    Cardiomyopathy Preop IABP Hx BPH, TURP and bladder outlet obstruction required cystoscopy to place foley\ probable UTI preop Preop severe protein loss malnutrition, prealbumin < 8 Pre and Post-op acute on chronic renal failure- required CVVH for postop acute HTN Preop guaiac + stool Preop chronic Eliquis for a-fib Postop acute on chronic anemia Postop fungemia Postop L pleural effusion requiring chest tube x2-  Postop acute respiratory failure requiring intubation   for apnea and possible mucous plugs   Patient with clinical deterioration starting postop day 12 , blood cultures positive for candida species. Pro calcitonin marker positive with clinical signs of sepsis. Antibiotic coverage has been increased and managed by ID. Patient has been replaced on pressors . Cultures have been negative since isolated candida species positive blood culture and patient was placed on antifungal drug Repeat left chest tube 28 French placed  with drainage 1 L of serosanguineous pleural effusion-cultures negative so far. Postop acute renal failure--renal consult placed-patient on CVVH via right IJ temporary dialysis catheter . HD has been effective in normalizing creatinine Echocardiogram  shows good unloading of the LV but with persistent moderate RV dysfunction  Patient much more alert and comfortable  Chest x-ray is almost completely clear now with drainage of left pleural effusion and hemodialysis Patient appeared ready for extubation this am but pre-extubation PS ABG with PCO2 > 55 due to metabolic alkalosis so extubation on hold  Hope to change dialysis technique to improve metabolic alkalosis and try to extubate tomorrow    INR now  2.2- coumadin 3 mg  continue aspirin 81 mg VAD parameters satisfactory w isolated PI events  Currently on continuous tube  feeds for protein malnutrition  LVAD INTERROGATION:  HeartMate II LVAD:  Flow 8.3 liters/min, speed 9200 rpm, power 5.2, PI 4.2  Controller intact  Objective:    Vital Signs:   Temp:  [98 F (36.7 C)-99.5 F (37.5 C)] 99.5 F (37.5 C) (01/16 1154) Pulse Rate:  [65-105] 104 (01/16 0800) Resp:  [17-31] 19 (01/16 0800) BP: (71-140)/(56-76) 88/67 mmHg (01/16 0745) SpO2:  [16 %-100 %] 100 % (01/16 0800) Arterial Line BP: (69-140)/(62-76) 98/71 mmHg (01/16 0800) FiO2 (%):  [40 %] 40 % (01/16 1057) Weight:  [132 lb 7.9 oz (60.1 kg)] 132 lb 7.9 oz (60.1 kg) (01/16 0600) Last BM Date: 10/10/15 Mean arterial Pressure 90-95  Intake/Output:   Intake/Output Summary (Last 24 hours) at 10/12/15 1313 Last data filed at 10/12/15 1132  Gross per 24 hour  Intake 3609.2 ml  Output   4090 ml  Net -480.8 ml     Physical Exam: General:  Appears weak and fragile. Currently intubated and responsive and more alert HEENT: normal Neck: supple. no JVP  No carotid pulses; no bruits. No lymphadenopathy or thryomegaly appreciated. Cor: Mechanical heart sounds with LVAD hum present. Lungs: clear. Improved breath sounds after placement of left chest tube. Left chest tube drainage now minimal Abdomen: soft, nontender, nondistended. No hepatosplenomegaly. No bruits or masses. Good bowel sounds. Extremities: no cyanosis, clubbing, rash, mild- mod extremity  edema Neuro: alert & orientedx3, cranial nerves grossly intact.             Generally weak with poor ambulation ability after              developing fungemia followed by generalized sepsis  Telemetry: paced rhythm  90  Labs: Basic Metabolic Panel:  Recent Labs Lab 10/06/2015 1446 10/10/15 0413  10/10/15 1550 10/11/15 0426  10/11/15 1538  10/12/15 0357 10/12/15 0406 10/12/15 0410 10/12/15 0602 10/12/15 0606  NA 141 142  --  137 137  < > 138  < > 138 140 138 138 141  K 4.7 4.7  --  4.5 4.7  < > 4.4  < > 3.5 3.5 3.6 3.7 3.7  CL 106 110  --   105 104  < > 98*  < > 91* 86* 93* 89* 86*  CO2 21* 25  --  24 26  --  29  --   --   --  36*  --   --   GLUCOSE 142* <20*  < > 110* 158*  < > 239*  < > 181* 233* 180* 250* 287*  BUN 74* 49*  --  34* 28*  < > 24*  < > 21* CREATININE 3.78* 2.53*  --  1.73* 1.36*  < > 1.14  < > 1.00 0.90 1.09 1.00 0.90  CALCIUM 9.3 9.5  --  8.7* 8.9  --  9.4  --   --   --  10.1  --   --   MG  --  2.3  --   --  2.2  --   --   --   --   --  2.1  --   --   PHOS 5.8* 3.9  --  2.3* 2.0*  --  1.5*  --   --   --   --   --   --   < > = values in this interval not displayed.  Liver Function Tests:  Recent Labs Lab 10/22/2015 0435  10/10/15 0413 10/10/15 1550 10/11/15 0426 10/11/15 1538 10/12/15 0410  AST 52*  --  61*  --  70*  --  79*  ALT 23  --  25  --  25  --  28  ALKPHOS 122  --  122  --  127*  --  138*  BILITOT 0.5  --  1.0  --  1.0  --  0.9  PROT 6.0*  --  6.7  --  6.1*  --  5.7*  ALBUMIN 3.3*  < > 3.6 3.0* 3.1* 3.0* 3.2*  < > = values in this interval not displayed. No results for input(s): LIPASE, AMYLASE in the last 168 hours. No results for input(s): AMMONIA in the last 168 hours.  CBC:  Recent Labs Lab 10/04/2015 0435 09/29/2015 1446 10/10/15 0413 10/11/15 0428  10/12/15 0357 10/12/15 0406 10/12/15 0410 10/12/15 0602 10/12/15 0606  WBC 11.5* 13.0* 13.3* 14.0*  --   --   --  12.9*  --   --   HGB 8.1* 10.5* 9.5* 9.8*  < > 9.9* 11.2* 9.3* 10.5* 11.2*  HCT 24.0* 29.7* 27.8* 28.5*  < > 29.0* 33.0* 27.4* 31.0* 33.0*  MCV 86.6 83.4 85.5 85.6  --   --   --  85.6  --   --   PLT 841* 933* 811* 876*  --   --   --  1017*  --   --   < > = values in this interval not displayed.  INR:  Recent Labs Lab 10/08/15 0430 09/27/2015 0435 10/10/15 0413 10/11/15 0426 10/12/15 0410  INR 2.68* 2.91* 2.54* 2.68* 2.21*    Other results:  EKG:   Imaging: Dg Chest [facility]  10/12/2015  CLINICAL DATA:  Left ventricular assist device. EXAM: PORTABLE CHEST 1 VIEW COMPARISON:  10/11/2015.  FINDINGS: Left chest tube, right IJ line, right PICC line in stable position. Drainage catheters projected over the lower chest and or upper abdomen in unchanged position. Prior CABG. Left ventricular assist device in stable position . Cardiac pacer stable position. Cardiomegaly with pulmonary vascular prominence and bilateral interstitial prominence with small right pleural effusion. Findings suggest mild congestive heart failure. Slight interim progression from prior exam. IMPRESSION: 1. Lines and tubes including left chest tube in stable position. No pneumothorax. 2. Prior CABG. Cardiac pacer and left ventricular assist device in stable position. Cardiomegaly with mild bilateral interstitial prominence noted. A mild component congestive heart failure cannot be excluded . Slight interim progression from prior exam . Electronically Signed   By: Maisie Fus  Register   On: 10/12/2015 07:57   Dg Chest Port 1 View  10/11/2015  CLINICAL DATA:  LVAD follow-up EXAM: PORTABLE CHEST 1 VIEW COMPARISON:  Radiograph 10/10/2015 FINDINGS: LVAD device partially imaged. Endotracheal tube unchanged. LEFT chest tube in place without pneumothorax. RIGHT central venous line and NG tube unchanged. No pneumothorax or pulmonary edema present. There is improvement in pulmonary edema pattern seen on comparison exam. Improvement in bilateral pleural effusions seen on comparison exam. IMPRESSION: 1. Improvement in pleural effusions. 2. Improvement in vascular congestion / edema. 3. Stable support apparatus. Electronically Signed   By: Genevive Bi M.D.   On: 10/11/2015 10:22     Medications:     Scheduled Medications: . albumin human  12.5 g Intravenous Q6H  . anidulafungin  100 mg Intravenous Q24H  . antiseptic oral rinse  7 mL Mouth Rinse QID  . aspirin  81 mg Oral Daily  . ceFEPime (MAXIPIME) IV  2 g Intravenous Q12H  . chlorhexidine gluconate  15 mL Mouth Rinse BID  . docusate sodium  200 mg Oral Daily  . feeding  supplement  237 mL Oral TID WC  . feeding supplement (PRO-STAT SUGAR FREE 64)  30 mL Per Tube BID  . furosemide  40 mg Intravenous Once  . insulin aspart  0-9 Units Subcutaneous 6 times per day  . insulin glargine  18 Units Subcutaneous BID  . pantoprazole sodium  40 mg Per Tube Daily  . sildenafil  40 mg Oral TID  . sodium chloride  10-40 mL Intracatheter Q12H  . warfarin  3 mg Oral q1800  . Warfarin - Physician Dosing Inpatient   Does not apply q1800    Infusions: . sodium chloride Stopped (09/25/15 1100)  . sodium chloride    . calcium gluconate infusion for CRRT Stopped (10/12/15 0900)  . dexmedetomidine Stopped (10/12/15 0700)  . epinephrine 3 mcg/min (10/12/15 0700)  . feeding supplement (OSMOLITE 1.5 CAL) 1,000 mL (10/12/15 0700)  . heparin 10,000 units/ 20 mL infusion syringe 1,000 Units/hr (10/12/15 1236)  . lactated ringers Stopped (09/26/15 0000)  . lactated ringers Stopped (09/24/15 0700)  . milrinone 0.25 mcg/kg/min (10/12/15 0700)  . norepinephrine (LEVOPHED) Adult infusion 6.5 mcg/min (10/12/15 1000)  . dialysis replacement fluid (prismasate) 300 mL/hr at 10/12/15 0952  . dialysis replacement fluid (prismasate) 200 mL/hr at 10/12/15 0955  . dialysate (PRISMASATE) 1,000 mL/hr at 10/12/15 0949    PRN Medications: sodium chloride, Place/Maintain arterial line **AND** sodium chloride, acetaminophen, albumin human, fentaNYL (SUBLIMAZE) injection, Gerhardt's butt cream, heparin, heparin, hydrALAZINE, ondansetron (ZOFRAN) IV, oxyCODONE, sodium chloride, sodium chloride   Assessment:  VAD flow parameters and function are satisfactory Echocardiogram  performed earlier in week shows adequate RV function and good decompression of LV.  Patient's clinical condition however has deteriorated-  Started with Candida species in the  the blood and now with clinical severe sepsis and multi-system failure. I discussed the situation with the patient's wife and she understands the  probable cause of his deterioration. She understands that the goal of both CVVH and mechanical ventilation are to provide support and the opportunity to clear his infection.  We'll continue continuous tube feeds to try to improve his nutritional status--currently with severe protein malnutrition.  Patient's uremia has  cleared with CVVH and chest x-ray has improved significantly. Fever has resolved, mild leukocytosis persists ( WBC 14 K).  When metabolic acidosis improves will try to extubate   Plan/Discussion:      I reviewed the LVAD parameters from today, and compared the results to the patient's prior recorded data.  No programming changes were made.  The LVAD is functioning within specified parameters.  The patient performs LVAD self-test daily.  LVAD interrogation was negative for any significant power changes, alarms or PI events/speed drops.  LVAD equipment check completed and is in good working order.  Back-up equipment present.   LVAD education done on emergency procedures and precautions and reviewed exit site care.  Length of Stay: [address]  Brad [name] 10/12/2015, 1:13 PM

## 2015-10-12 NOTE — Progress Notes (Signed)
Patient ID: Brad [name], male   DOB: 10-14-1940, 75 y.o.   MRN: [id number]    Advanced Heart Failure Rounding Note HeartMate 2 Rounding Note  Subjective:   Admitted from [facility] with cardiogenic shock for advanced heart failure as INTERMACS-1.  On 12/22 foley placed by Urology requiring urethral dilation -> UTI . ? Septic shock. Placed on vanco/zosyn. IABP placed 12/23. S/p HM 2 LVAD placement 09/20/2015.   Emergently intubated 1/13 for respiratory distress and obtundation. Remains on CVVHD. Tolerating well.  Abx broadened. Now on vanc, cefipime, levofloxacin and andulafungin.   Also on levophed 7.5 mcg, milrinone 0.25 mcg, and epi 3. CVP 5.  Much more alert. Following commands. Weight down 10 pounds in last week.   Blood Cx 09/29/15- 1/2 yeast ->speciation still pending -Lab sent to [facility] Urine Cx 09/29/15 - NG UA 09/29/15- with few bacteria  C. Diff 09/29/15 - Negative Resp Culture 09/25/16 - Negative. Resp Culture 09/30/15 -R are GNR/GPC -> STENOTROPHOMONAS MALTOPHILIA    LVAD INTERROGATION:  HeartMate II LVAD:  Flow 7.2 liters/min, speed 9200, power 6.7  , PI  5.8  Occasional PI events   Objective:    Vital Signs:   Temp:  [98 F (36.7 C)-99.5 F (37.5 C)] 98.6 F (37 C) (01/16 0806) Pulse Rate:  [65-105] 98 (01/16 0745) Resp:  [17-31] 19 (01/16 0745) BP: (71-140)/(56-76) 99/68 mmHg (01/16 0800) SpO2:  [16 %-100 %] 100 % (01/16 0745) Arterial Line BP: (69-140)/(62-76) 96/70 mmHg (01/16 0500) FiO2 (%):  [40 %] 40 % (01/16 0745) Weight:  [132 lb 7.9 oz (60.1 kg)] 132 lb 7.9 oz (60.1 kg) (01/16 0600) Last BM Date: 10/10/15 Mean arterial Pressure 70-80s   Intake/Output:   Intake/Output Summary (Last 24 hours) at 10/12/15 0834 Last data filed at 10/12/15 0700  Gross per 24 hour  Intake 4382.4 ml  Output   4440 ml  Net  -57.6 ml     Physical Exam: CVP ~5 General: intubated. Awake follows commands HEENT: normal x for panda and ETT Neck: supple.  RIJ trialysis cath  Carotids 2+ bilat; no bruits. No thyromegaly or nodule noted.  Cor: PMI nondisplaced. RRR. LVAD hum present.  Lungs: Decreased bases bilaterally. Abdomen: soft, NT, mildly distended , no HSM. No bruits or masses. Hypoactive BS Extremities: no cyanosis, clubbing, rash. RUE PICC  Rand LLE tr-1+ edema into thighs Neuro: intubated but awakens and follows comamnds moves all 4 extremities w/o difficulty GU: Foley hematuria   Telemetry: reviewed personally, a sensed v pacing 90s   Labs: Basic Metabolic Panel:  Recent Labs Lab 10/04/2015 1446 10/10/15 0413  10/10/15 1550 10/11/15 0426  10/11/15 1538  10/12/15 0357 10/12/15 0406 10/12/15 0410 10/12/15 0602 10/12/15 0606  NA 141 142  --  137 137  < > 138  < > 138 140 138 138 141  K 4.7 4.7  --  4.5 4.7  < > 4.4  < > 3.5 3.5 3.6 3.7 3.7  CL 106 110  --  105 104  < > 98*  < > 91* 86* 93* 89* 86*  CO2 21* 25  --  24 26  --  29  --   --   --  36*  --   --   GLUCOSE 142* <20*  < > 110* 158*  < > 239*  < > 181* 233* 180* 250* 287*  BUN 74* 49*  --  34* 28*  < > 24*  < > 21* 18 19 20  18  CREATININE 3.78* 2.53*  --  1.73* 1.36*  < > 1.14  < > 1.00 0.90 1.09 1.00 0.90  CALCIUM 9.3 9.5  --  8.7* 8.9  --  9.4  --   --   --  10.1  --   --   MG  --  2.3  --   --  2.2  --   --   --   --   --  2.1  --   --   PHOS 5.8* 3.9  --  2.3* 2.0*  --  1.5*  --   --   --   --   --   --   < > = values in this interval not displayed.  Liver Function Tests:  Recent Labs Lab 10/26/2015 0435  10/10/15 0413 10/10/15 1550 10/11/15 0426 10/11/15 1538 10/12/15 0410  AST 52*  --  61*  --  70*  --  79*  ALT 23  --  25  --  25  --  28  ALKPHOS 122  --  122  --  127*  --  138*  BILITOT 0.5  --  1.0  --  1.0  --  0.9  PROT 6.0*  --  6.7  --  6.1*  --  5.7*  ALBUMIN 3.3*  < > 3.6 3.0* 3.1* 3.0* 3.2*  < > = values in this interval not displayed. No results for input(s): LIPASE, AMYLASE in the last 168 hours. No results for input(s): AMMONIA in the last 168  hours.  CBC:  Recent Labs Lab 10/12/2015 0435 10/19/2015 1446 10/10/15 0413 10/11/15 0428  10/12/15 0357 10/12/15 0406 10/12/15 0410 10/12/15 0602 10/12/15 0606  WBC 11.5* 13.0* 13.3* 14.0*  --   --   --  12.9*  --   --   HGB 8.1* 10.5* 9.5* 9.8*  < > 9.9* 11.2* 9.3* 10.5* 11.2*  HCT 24.0* 29.7* 27.8* 28.5*  < > 29.0* 33.0* 27.4* 31.0* 33.0*  MCV 86.6 83.4 85.5 85.6  --   --   --  85.6  --   --   PLT 841* 933* 811* 876*  --   --   --  1017*  --   --   < > = values in this interval not displayed.  INR:  Recent Labs Lab 10/08/15 0430 10/08/2015 0435 10/10/15 0413 10/11/15 0426 10/12/15 0410  INR 2.68* 2.91* 2.54* 2.68* 2.21*    Other results:    Imaging: Dg Chest Port 1 View  10/12/2015  CLINICAL DATA:  Left ventricular assist device. EXAM: PORTABLE CHEST 1 VIEW COMPARISON:  10/11/2015. FINDINGS: Left chest tube, right IJ line, right PICC line in stable position. Drainage catheters projected over the lower chest and or upper abdomen in unchanged position. Prior CABG. Left ventricular assist device in stable position . Cardiac pacer stable position. Cardiomegaly with pulmonary vascular prominence and bilateral interstitial prominence with small right pleural effusion. Findings suggest mild congestive heart failure. Slight interim progression from prior exam. IMPRESSION: 1. Lines and tubes including left chest tube in stable position. No pneumothorax. 2. Prior CABG. Cardiac pacer and left ventricular assist device in stable position. Cardiomegaly with mild bilateral interstitial prominence noted. A mild component congestive heart failure cannot be excluded . Slight interim progression from prior exam . Electronically Signed   By: Maisie Fus  Register   On: 10/12/2015 07:57   Dg Chest Port 1 View  10/11/2015  CLINICAL DATA:  LVAD follow-up EXAM: PORTABLE CHEST 1 VIEW COMPARISON:  Radiograph 10/10/2015 FINDINGS: LVAD device partially imaged. Endotracheal tube unchanged. LEFT chest tube in  place without pneumothorax. RIGHT central venous line and NG tube unchanged. No pneumothorax or pulmonary edema present. There is improvement in pulmonary edema pattern seen on comparison exam. Improvement in bilateral pleural effusions seen on comparison exam. IMPRESSION: 1. Improvement in pleural effusions. 2. Improvement in vascular congestion / edema. 3. Stable support apparatus. Electronically Signed   By: Genevive Bi M.D.   On: 10/11/2015 10:22     Medications:     Scheduled Medications: . albumin human  12.5 g Intravenous Q6H  . anidulafungin  100 mg Intravenous Q24H  . antiseptic oral rinse  7 mL Mouth Rinse QID  . aspirin  81 mg Oral Daily  . ceFEPime (MAXIPIME) IV  2 g Intravenous Q12H  . chlorhexidine gluconate  15 mL Mouth Rinse BID  . docusate sodium  200 mg Oral Daily  . feeding supplement  237 mL Oral TID WC  . feeding supplement (PRO-STAT SUGAR FREE 64)  30 mL Per Tube BID  . furosemide  40 mg Intravenous Once  . insulin aspart  0-9 Units Subcutaneous 6 times per day  . insulin glargine  18 Units Subcutaneous BID  . pantoprazole sodium  40 mg Per Tube Daily  . sildenafil  40 mg Oral TID  . sodium chloride  10-40 mL Intracatheter Q12H  . vancomycin  750 mg Intravenous Q24H  . warfarin  3 mg Oral q1800  . Warfarin - Physician Dosing Inpatient   Does not apply q1800    Infusions: . sodium chloride Stopped (09/25/15 1100)  . sodium chloride    . calcium gluconate infusion for CRRT 20 g (10/12/15 0700)  . dexmedetomidine Stopped (10/12/15 0700)  . epinephrine 3 mcg/min (10/12/15 0700)  . feeding supplement (OSMOLITE 1.5 CAL) 1,000 mL (10/12/15 0700)  . heparin 10,000 units/ 20 mL infusion syringe    . lactated ringers Stopped (09/26/15 0000)  . lactated ringers Stopped (09/24/15 0700)  . milrinone 0.25 mcg/kg/min (10/12/15 0700)  . norepinephrine (LEVOPHED) Adult infusion 8 mcg/min (10/12/15 0700)  . dialysis replacement fluid (prismasate)    . dialysis  replacement fluid (prismasate)    . dialysate (PRISMASATE)      PRN Medications: sodium chloride, Place/Maintain arterial line **AND** sodium chloride, acetaminophen, albumin human, fentaNYL (SUBLIMAZE) injection, Gerhardt's butt cream, heparin, heparin, hydrALAZINE, ondansetron (ZOFRAN) IV, oxyCODONE, sodium chloride, sodium chloride   Assessment/Plan   1. Acute on chronic systolic HF -> Cardiogenic shock: NICM with EF 5%, mild to moderate RV hypokinesis.  S/p St Jude CRT-D. s/p HM II LVAD placement 09/29/15.  - Improving with CVVHD.   -Remains on levophed, epi and milrinone. Wean slowly once extubated.  D/W Dr. Donata Clay.  2. Acute respiratory failure -intubated 1/13 for respiratory distress. S/p left chest tube for effusion. On vanc, cefipime and levofloxacin. Weaning now. Hopefully can extubate tomorrow per Dr. Donata Clay 3. Septic shock: Now with fungemia and stenotrophamonas in sputum -  On vanc, cefipime, levofloxacin and andulafungin. ID following.  - Wean pressors as tolerated 4. Hypoglycemia -improved 5. Acute on chronic renal failure, stage V -due to ATN. On CVVHD 6. Atrial fibrillation: Paroxysmal.    - Maintaining sinus  - On coumadin and po amiodarone. 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 Urology consulted before LVAD with foley placed. Foley was removed 10/05/15. Urine culture sent. Urology placed foley 1/11.   8. Anemia- Overall received 2UPRBCs. . Todays Hgb 11.1  9. L Pleural Effusion- S/P chest tube. Output slowing.    10. Anticoagulation -Continue ASA and couamdin  The patient is critically ill with multiple organ systems failure and requires high complexity decision making for assessment and support, frequent evaluation and titration of therapies, application of advanced monitoring technologies and extensive interpretation of multiple databases.   Critical Care Time devoted to patient care services described in this note is 35 Minutes.   I reviewed  the LVAD parameters from today, and compared the results to the patient's prior recorded data.  No programming changes were made.  The LVAD is functioning within specified parameters. LVAD interrogation was negative for any significant power changes, alarms or PI events/speed drops.  LVAD equipment check completed and is in good working order.  Back-up equipment present.   Length of Stay: 75  Amy Clegg NP-C   10/12/2015, 8:34 AM    VAD Team --- VAD ISSUES ONLY--- Pager [phone] (7am - 7am)  The patient is critically ill with multiple organ systems failure and requires high complexity decision making for assessment and support, frequent evaluation and titration of therapies, application of advanced monitoring technologies and extensive interpretation of multiple databases.   Critical Care Time devoted to patient care services described in this note is 35 Minutes.  Improving slowly but remains critically ill. Volume status and uremia improved with CVVHD. Hopefully can extubate tomorrow. Remains on pressors. MAPs ok. Will wean once exutbated. Continue broad spectrum abx and anti-fungal coverage. Appreciate ID input. VAD parameters stable.   Bensimhon, Daniel,MD 3:23 PM   .

## 2015-10-12 NOTE — Progress Notes (Signed)
PT Cancellation Note  Patient Details Name: Brad [name] MRN: [id number] DOB: 1941-07-13   Cancelled Treatment:    Reason Eval/Treat Not Completed: Medical issues which prohibited therapy (Pt on CRRT, on vent, on pressors.  Not ready for PT.) Will check back tomorrow.  Thanks.   [name] F 10/12/2015, 9:57 AM [name] [phone] (pager)

## 2015-10-12 NOTE — Progress Notes (Signed)
Subjective: Interval History: has no complaint , but entub, does coop.  Objective: Vital signs in last 24 hours: Temp:  [97.9 F (36.6 C)-99.5 F (37.5 C)] 98 F (36.7 C) (01/16 0420) Pulse Rate:  [65-105] 95 (01/16 0500) Resp:  [17-31] 19 (01/16 0500) BP: (71-140)/(56-76) 71/64 mmHg (01/16 0700) SpO2:  [16 %-100 %] 100 % (01/16 0500) Arterial Line BP: (69-140)/(62-76) 96/70 mmHg (01/16 0500) FiO2 (%):  [40 %] 40 % (01/16 0351) Weight:  [60.1 kg (132 lb 7.9 oz)] 60.1 kg (132 lb 7.9 oz) (01/16 0600) Weight change: -0.6 kg (-1 lb 5.2 oz)  Intake/Output from previous day: 01/15 0701 - 01/16 0700 In: 4470.4 [I.V.:2094.4; NG/GT:1796; IV Piggyback:580] Out: 4609 [Urine:600; Chest Tube:190] Intake/Output this shift:    General appearance: cooperative and slowed mentation Neck: RIJ cath Resp: diminished breath sounds bilaterally and rhonchi bilaterally Cardio: cont flow sound with LVAD GI: mild distension, tight, pos bs,  Extremities: edema 2+  Lab Results:  Recent Labs  10/11/15 0428  10/12/15 0410 10/12/15 0602 10/12/15 0606  WBC 14.0*  --  12.9*  --   --   HGB 9.8*  < > 9.3* 10.5* 11.2*  HCT 28.5*  < > 27.4* 31.0* 33.0*  PLT 876*  --  1017*  --   --   < > = values in this interval not displayed. BMET:  Recent Labs  10/11/15 1538  10/12/15 0410 10/12/15 0602 10/12/15 0606  NA 138  < > 138 138 141  K 4.4  < > 3.6 3.7 3.7  CL 98*  < > 93* 89* 86*  CO2 29  --  36*  --   --   GLUCOSE 239*  < > 180* 250* 287*  BUN 24*  < > 19 20 18   CREATININE 1.14  < > 1.09 1.00 0.90  CALCIUM 9.4  --  10.1  --   --   < > = values in this interval not displayed. No results for input(s): PTH in the last 72 hours. Iron Studies:  Recent Labs  10/02/2015 1446  IRON 23*  TIBC 178*  FERRITIN 1895*    Studies/Results: Dg Chest Port 1 View  10/11/2015  CLINICAL DATA:  LVAD follow-up EXAM: PORTABLE CHEST 1 VIEW COMPARISON:  Radiograph 10/10/2015 FINDINGS: LVAD device partially  imaged. Endotracheal tube unchanged. LEFT chest tube in place without pneumothorax. RIGHT central venous line and NG tube unchanged. No pneumothorax or pulmonary edema present. There is improvement in pulmonary edema pattern seen on comparison exam. Improvement in bilateral pleural effusions seen on comparison exam. IMPRESSION: 1. Improvement in pleural effusions. 2. Improvement in vascular congestion / edema. 3. Stable support apparatus. Electronically Signed   By: Genevive Bi M.D.   On: 10/11/2015 10:22    I have reviewed the patient's current medications.  Assessment/Plan: 1 AKI/CKD4 making some urine. Good solute/acid/base/K but vol xs . CVP ok. Will lower CRRT clearance and see what he has.  bp marginal and on pressors. Limits vol off 2 Anemia will follow, willneed Fe soon 3 Cardiogenic schock/NICM?EF 5/ LVAD per Cards 4 Fungal sepsis on meds 5 exudative effusion 6 HCAP 7 Malnutrition. TF 8 Defib 9Cardiac arrest P lower support of CRRT, follow urine, chem, cont AB, vent.      LOS: 26 days   Brad [name] 10/12/2015,7:21 AM

## 2015-10-13 ENCOUNTER — Inpatient Hospital Stay (HOSPITAL_COMMUNITY): Payer: Medicare Other

## 2015-10-13 DIAGNOSIS — J9690 Respiratory failure, unspecified, unspecified whether with hypoxia or hypercapnia: Secondary | ICD-10-CM

## 2015-10-13 DIAGNOSIS — L8990 Pressure ulcer of unspecified site, unspecified stage: Secondary | ICD-10-CM | POA: Insufficient documentation

## 2015-10-13 DIAGNOSIS — J9600 Acute respiratory failure, unspecified whether with hypoxia or hypercapnia: Secondary | ICD-10-CM

## 2015-10-13 LAB — POCT I-STAT 3, ART BLOOD GAS (G3+)
ACID-BASE EXCESS: 12 mmol/L — AB (ref 0.0–2.0)
ACID-BASE EXCESS: 8 mmol/L — AB (ref 0.0–2.0)
Acid-Base Excess: 7 mmol/L — ABNORMAL HIGH (ref 0.0–2.0)
Bicarbonate: 37 mEq/L — ABNORMAL HIGH (ref 20.0–24.0)
Bicarbonate: 33.3 mEq/L — ABNORMAL HIGH (ref 20.0–24.0)
Bicarbonate: 32.1 mEq/L — ABNORMAL HIGH (ref 20.0–24.0)
O2 SAT: 100 %
O2 SAT: 99 %
O2 Saturation: 98 %
PCO2 ART: 50.2 mmHg — AB (ref 35.0–45.0)
PCO2 ART: 48.2 mmHg — AB (ref 35.0–45.0)
PCO2 ART: 46.5 mmHg — AB (ref 35.0–45.0)
PH ART: 7.476 — AB (ref 7.350–7.450)
PO2 ART: 166 mmHg — AB (ref 80.0–100.0)
PO2 ART: 102 mmHg — AB (ref 80.0–100.0)
Patient temperature: 98.9
Patient temperature: 98.2
Patient temperature: 99.5
TCO2: 38 mmol/L (ref 0–100)
TCO2: 35 mmol/L (ref 0–100)
TCO2: 33 mmol/L (ref 0–100)
pH, Arterial: 7.447 (ref 7.350–7.450)
pH, Arterial: 7.448 (ref 7.350–7.450)
pO2, Arterial: 117 mmHg — ABNORMAL HIGH (ref 80.0–100.0)

## 2015-10-13 LAB — RENAL FUNCTION PANEL
ALBUMIN: 3.5 g/dL (ref 3.5–5.0)
Anion gap: 9 (ref 5–15)
BUN: 22 mg/dL — AB (ref 6–20)
CO2: 29 mmol/L (ref 22–32)
CREATININE: 1.2 mg/dL (ref 0.61–1.24)
Calcium: 9.1 mg/dL (ref 8.9–10.3)
Chloride: 102 mmol/L (ref 101–111)
GFR calc Af Amer: >60 mL/min (ref >60)
GFR, EST NON AFRICAN AMERICAN: 58 mL/min — AB (ref >60)
Glucose, Bld: 121 mg/dL — ABNORMAL HIGH (ref 65–99)
PHOSPHORUS: 3.4 mg/dL (ref 2.5–4.6)
Potassium: 4.2 mmol/L (ref 3.5–5.1)
SODIUM: 140 mmol/L (ref 135–145)

## 2015-10-13 LAB — POCT ACTIVATED CLOTTING TIME
ACTIVATED CLOTTING TIME: 198 s
ACTIVATED CLOTTING TIME: 203 s
ACTIVATED CLOTTING TIME: 209 s
ACTIVATED CLOTTING TIME: 209 s
ACTIVATED CLOTTING TIME: 209 s
ACTIVATED CLOTTING TIME: 214 s
Activated Clotting Time: 214 seconds
Activated Clotting Time: 214 seconds
Activated Clotting Time: 214 seconds
Activated Clotting Time: 224 seconds

## 2015-10-13 LAB — GLUCOSE, CAPILLARY
GLUCOSE-CAPILLARY: 106 mg/dL — AB (ref 65–99)
GLUCOSE-CAPILLARY: 107 mg/dL — AB (ref 65–99)
GLUCOSE-CAPILLARY: 77 mg/dL (ref 65–99)
Glucose-Capillary: 68 mg/dL (ref 65–99)
Glucose-Capillary: 80 mg/dL (ref 65–99)
Glucose-Capillary: 132 mg/dL — ABNORMAL HIGH (ref 65–99)
Glucose-Capillary: 112 mg/dL — ABNORMAL HIGH (ref 65–99)
Glucose-Capillary: 92 mg/dL (ref 65–99)

## 2015-10-13 LAB — CARBOXYHEMOGLOBIN
Carboxyhemoglobin: 1.3 % (ref 0.5–1.5)
Methemoglobin: 1 % (ref 0.0–1.5)
O2 Saturation: 74.2 %
Total hemoglobin: 8.2 g/dL — ABNORMAL LOW (ref 13.5–18.0)

## 2015-10-13 LAB — CBC
HCT: 25.6 % — ABNORMAL LOW (ref 39.0–52.0)
Hemoglobin: 8.3 g/dL — ABNORMAL LOW (ref 13.0–17.0)
MCH: 28.6 pg (ref 26.0–34.0)
MCHC: 32.4 g/dL (ref 30.0–36.0)
MCV: 88.3 fL (ref 78.0–100.0)
Platelets: 979 10*3/uL (ref 150–400)
RBC: 2.9 MIL/uL — ABNORMAL LOW (ref 4.22–5.81)
RDW: 16.9 % — ABNORMAL HIGH (ref 11.5–15.5)
WBC: 13.2 10*3/uL — ABNORMAL HIGH (ref 4.0–10.5)

## 2015-10-13 LAB — COMPREHENSIVE METABOLIC PANEL
ALT: 45 U/L (ref 17–63)
AST: 154 U/L — ABNORMAL HIGH (ref 15–41)
Albumin: 3.7 g/dL (ref 3.5–5.0)
Alkaline Phosphatase: 136 U/L — ABNORMAL HIGH (ref 38–126)
Anion gap: 9 (ref 5–15)
BUN: 21 mg/dL — ABNORMAL HIGH (ref 6–20)
CO2: 33 mmol/L — ABNORMAL HIGH (ref 22–32)
Calcium: 9.3 mg/dL (ref 8.9–10.3)
Chloride: 97 mmol/L — ABNORMAL LOW (ref 101–111)
Creatinine, Ser: 1.19 mg/dL (ref 0.61–1.24)
GFR calc Af Amer: >60 mL/min (ref >60)
GFR calc non Af Amer: 58 mL/min — ABNORMAL LOW (ref >60)
Glucose, Bld: 78 mg/dL (ref 65–99)
Potassium: 3.6 mmol/L (ref 3.5–5.1)
Sodium: 139 mmol/L (ref 135–145)
Total Bilirubin: 0.7 mg/dL (ref 0.3–1.2)
Total Protein: 6 g/dL — ABNORMAL LOW (ref 6.5–8.1)

## 2015-10-13 LAB — CULTURE, RESPIRATORY W GRAM STAIN
Culture: NORMAL
Special Requests: NORMAL

## 2015-10-13 LAB — CULTURE, BLOOD (ROUTINE X 2)
CULTURE: NO GROWTH
CULTURE: NO GROWTH

## 2015-10-13 LAB — LACTATE DEHYDROGENASE: LDH: 401 U/L — ABNORMAL HIGH (ref 98–192)

## 2015-10-13 LAB — PROTIME-INR
INR: 2.05 — ABNORMAL HIGH (ref 0.00–1.49)
Prothrombin Time: 23 seconds — ABNORMAL HIGH (ref 11.6–15.2)

## 2015-10-13 LAB — PATHOLOGIST SMEAR REVIEW

## 2015-10-13 LAB — PREPARE RBC (CROSSMATCH)

## 2015-10-13 LAB — PARATHYROID HORMONE, INTACT (NO CA): PTH: 183 pg/mL — AB (ref 15–65)

## 2015-10-13 LAB — MAGNESIUM: Magnesium: 2.1 mg/dL (ref 1.7–2.4)

## 2015-10-13 LAB — CALCIUM, IONIZED: Calcium, Ionized, Serum: 5.2 mg/dL (ref 4.5–5.6)

## 2015-10-13 LAB — PHOSPHORUS: PHOSPHORUS: 1.3 mg/dL — AB (ref 2.5–4.6)

## 2015-10-13 LAB — APTT: aPTT: 119 seconds — ABNORMAL HIGH (ref 24–37)

## 2015-10-13 MED ORDER — INSULIN GLARGINE 100 UNIT/ML ~~LOC~~ SOLN
15.0000 [IU] | Freq: Two times a day (BID) | SUBCUTANEOUS | Status: DC
  Administered 2015-10-13 – 2015-10-14 (×2): 15 [IU] via SUBCUTANEOUS
  Filled 2015-10-13 (×4): qty 0.15

## 2015-10-13 MED ORDER — DEXTROSE 50 % IV SOLN
25.0000 mL | Freq: Once | INTRAVENOUS | Status: AC
  Administered 2015-10-13: 25 mL via INTRAVENOUS

## 2015-10-13 MED ORDER — SODIUM CHLORIDE 0.9 % IV SOLN
Freq: Once | INTRAVENOUS | Status: AC
  Administered 2015-10-13: 10:00:00 via INTRAVENOUS

## 2015-10-13 MED ORDER — LEVALBUTEROL HCL 1.25 MG/0.5ML IN NEBU
1.2500 mg | INHALATION_SOLUTION | Freq: Four times a day (QID) | RESPIRATORY_TRACT | Status: DC
  Administered 2015-10-13: 1.25 mg via RESPIRATORY_TRACT
  Filled 2015-10-13: qty 0.5

## 2015-10-13 MED ORDER — SODIUM PHOSPHATE 3 MMOLE/ML IV SOLN
30.0000 mmol | Freq: Once | INTRAVENOUS | Status: AC
  Administered 2015-10-13: 30 mmol via INTRAVENOUS
  Filled 2015-10-13: qty 10

## 2015-10-13 MED ORDER — CETYLPYRIDINIUM CHLORIDE 0.05 % MT LIQD
7.0000 mL | Freq: Two times a day (BID) | OROMUCOSAL | Status: DC
  Administered 2015-10-14 – 2015-10-15 (×3): 7 mL via OROMUCOSAL

## 2015-10-13 MED ORDER — DEXTROSE 50 % IV SOLN
INTRAVENOUS | Status: AC
  Filled 2015-10-13: qty 50

## 2015-10-13 MED ORDER — CHLORHEXIDINE GLUCONATE 0.12 % MT SOLN
15.0000 mL | Freq: Two times a day (BID) | OROMUCOSAL | Status: DC
  Administered 2015-10-14 – 2015-10-15 (×3): 15 mL via OROMUCOSAL
  Filled 2015-10-13: qty 15

## 2015-10-13 MED ORDER — POTASSIUM CHLORIDE 10 MEQ/50ML IV SOLN
10.0000 meq | INTRAVENOUS | Status: AC
  Administered 2015-10-13 (×2): 10 meq via INTRAVENOUS
  Filled 2015-10-13 (×2): qty 50

## 2015-10-13 MED ORDER — LEVALBUTEROL HCL 1.25 MG/0.5ML IN NEBU
1.2500 mg | INHALATION_SOLUTION | Freq: Four times a day (QID) | RESPIRATORY_TRACT | Status: DC
  Administered 2015-10-13 – 2015-10-15 (×8): 1.25 mg via RESPIRATORY_TRACT
  Filled 2015-10-13 (×7): qty 0.5

## 2015-10-13 NOTE — Progress Notes (Signed)
Patient ID: Brad [name], male   DOB: 12/10/1940, 75 y.o.   MRN: [id number]    Advanced Heart Failure Rounding Note HeartMate 2 Rounding Note  Subjective:   Admitted from [facility] with cardiogenic shock for advanced heart failure as INTERMACS-1.  On 12/22 foley placed by Urology requiring urethral dilation -> UTI . ? Septic shock. Placed on vanco/zosyn. IABP placed 12/23. S/p HM 2 LVAD placement 09/06/2015.   Emergently intubated 1/13 for respiratory distress and obtundation. Remains on CVVHD. Tolerating well.  Abx broadened. Now on cefipime and andulafungin.   Also on levophed 6 mcg, milrinone 0.25 mcg, and epi 3. CVP 3-4 . Hgb down to 8.3. No signs of bleeding.   Following commands. Weight down 4 pounds.    Blood Cx 09/29/15- 1/2 yeast ->speciation still pending -Lab sent to [facility] Urine Cx 09/29/15 - NG UA 09/29/15- with few bacteria  C. Diff 09/29/15 - Negative Resp Culture 09/25/16 - Negative. Resp Culture 09/30/15 -R are GNR/GPC -> STENOTROPHOMONAS MALTOPHILIA    LVAD INTERROGATION:  HeartMate II LVAD:  Flow 7.2 liters/min, speed 9200, power 6.7  , PI  5.8  Occasional PI events   Objective:    Vital Signs:   Temp:  [98.6 F (37 C)-99.6 F (37.6 C)] 98.9 F (37.2 C) (01/17 0345) Pulse Rate:  [65-117] 100 (01/17 0700) Resp:  [15-23] 16 (01/17 0700) BP: (85-132)/(63-77) 98/73 mmHg (01/17 0700) SpO2:  [100 %] 100 % (01/17 0700) Arterial Line BP: (82-172)/(67-88) 98/73 mmHg (01/17 0700) FiO2 (%):  [40 %] 40 % (01/17 0432) Weight:  [128 lb 12 oz (58.4 kg)] 128 lb 12 oz (58.4 kg) (01/17 0600) Last BM Date: 10/10/15 Mean arterial Pressure 70-80s   Intake/Output:   Intake/Output Summary (Last 24 hours) at 10/13/15 0802 Last data filed at 10/13/15 0700  Gross per 24 hour  Intake 2900.94 ml  Output   3012 ml  Net -111.06 ml     Physical Exam: CVP 4  General: intubated. Awake follows commands HEENT: normal x for panda and ETT Neck: supple.  RIJ trialysis cath Carotids 2+  bilat; no bruits. No thyromegaly or nodule noted.  Cor: PMI nondisplaced. RRR. LVAD hum present.  Lungs: Decreased bases bilaterally. Abdomen: soft, NT, mildly distended , no HSM. No bruits or masses. Hypoactive BS Extremities: no cyanosis, clubbing, rash. RUE PICC  Rand LLE tr-1+ edema into thighs Neuro: intubated but awakens and follows comamnds moves all 4 extremities w/o difficulty GU: Foley hematuria   Telemetry: reviewed personally, a sensed v pacing 90s   Labs: Basic Metabolic Panel:  Recent Labs Lab 10/10/15 0413  10/10/15 1550 10/11/15 0426  10/11/15 1538  10/12/15 0410 10/12/15 0602 10/12/15 0606 10/12/15 1550 10/13/15 0510  NA 142  --  137 137  < > 138  < > 138 138 141 141 139  K 4.7  --  4.5 4.7  < > 4.4  < > 3.6 3.7 3.7 3.6 3.6  CL 110  --  105 104  < > 98*  < > 93* 89* 86* 94* 97*  CO2 25  --  24 26  --  29  --  36*  --   --  37* 33*  GLUCOSE <20*  < > 110* 158*  < > 239*  < > 180* 250* 287* 127* 78  BUN 49*  --  34* 28*  < > 24*  < > 19 20 18 20  21*  CREATININE 2.53*  --  1.73* 1.36*  < >  1.14  < > 1.09 1.00 0.90 1.15 1.19  CALCIUM 9.5  --  8.7* 8.9  --  9.4  --  10.1  --   --  9.7 9.3  MG 2.3  --   --  2.2  --   --   --  2.1  --   --   --  2.1  PHOS 3.9  --  2.3* 2.0*  --  1.5*  --   --   --   --  1.4* 1.3*  < > = values in this interval not displayed.  Liver Function Tests:  Recent Labs Lab 10-21-15 0435  10/10/15 0413  10/11/15 0426 10/11/15 1538 10/12/15 0410 10/12/15 1550 10/13/15 0510  AST 52*  --  61*  --  70*  --  79*  --  154*  ALT 23  --  25  --  25  --  28  --  45  ALKPHOS 122  --  122  --  127*  --  138*  --  136*  BILITOT 0.5  --  1.0  --  1.0  --  0.9  --  0.7  PROT 6.0*  --  6.7  --  6.1*  --  5.7*  --  6.0*  ALBUMIN 3.3*  < > 3.6  < > 3.1* 3.0* 3.2* 3.3* 3.7  < > = values in this interval not displayed. No results for input(s): LIPASE, AMYLASE in the last 168 hours. No results for input(s): AMMONIA in the last 168  hours.  CBC:  Recent Labs Lab 2015-10-21 1446 10/10/15 0413 10/11/15 0428  10/12/15 0406 10/12/15 0410 10/12/15 0602 10/12/15 0606 10/13/15 0510  WBC 13.0* 13.3* 14.0*  --   --  12.9*  --   --  13.2*  HGB 10.5* 9.5* 9.8*  < > 11.2* 9.3* 10.5* 11.2* 8.3*  HCT 29.7* 27.8* 28.5*  < > 33.0* 27.4* 31.0* 33.0* 25.6*  MCV 83.4 85.5 85.6  --   --  85.6  --   --  88.3  PLT 933* 811* 876*  --   --  1017*  --   --  979*  < > = values in this interval not displayed.  INR:  Recent Labs Lab 10/21/2015 0435 10/10/15 0413 10/11/15 0426 10/12/15 0410 10/13/15 0510  INR 2.91* 2.54* 2.68* 2.21* 2.05*    Other results:    Imaging: Dg Chest Port 1 View  10/12/2015  CLINICAL DATA:  Left ventricular assist device. EXAM: PORTABLE CHEST 1 VIEW COMPARISON:  10/11/2015. FINDINGS: Left chest tube, right IJ line, right PICC line in stable position. Drainage catheters projected over the lower chest and or upper abdomen in unchanged position. Prior CABG. Left ventricular assist device in stable position . Cardiac pacer stable position. Cardiomegaly with pulmonary vascular prominence and bilateral interstitial prominence with small right pleural effusion. Findings suggest mild congestive heart failure. Slight interim progression from prior exam. IMPRESSION: 1. Lines and tubes including left chest tube in stable position. No pneumothorax. 2. Prior CABG. Cardiac pacer and left ventricular assist device in stable position. Cardiomegaly with mild bilateral interstitial prominence noted. A mild component congestive heart failure cannot be excluded . Slight interim progression from prior exam . Electronically Signed   By: Maisie Fus  Register   On: 10/12/2015 07:57     Medications:     Scheduled Medications: . albumin human  12.5 g Intravenous Q6H  . anidulafungin  100 mg Intravenous Q24H  . antiseptic oral rinse  7 mL Mouth Rinse 10 times per day  . aspirin  81 mg Oral Daily  . ceFEPime (MAXIPIME) IV  2 g  Intravenous Q12H  . chlorhexidine gluconate  15 mL Mouth Rinse BID  . docusate sodium  200 mg Oral Daily  . feeding supplement  237 mL Oral TID WC  . feeding supplement (PRO-STAT SUGAR FREE 64)  30 mL Per Tube BID  . furosemide  40 mg Intravenous Once  . insulin aspart  0-9 Units Subcutaneous 6 times per day  . insulin glargine  18 Units Subcutaneous BID  . pantoprazole sodium  40 mg Per Tube Daily  . sildenafil  40 mg Oral TID  . sodium chloride  10-40 mL Intracatheter Q12H  . warfarin  3 mg Oral q1800  . Warfarin - Physician Dosing Inpatient   Does not apply q1800    Infusions: . sodium chloride Stopped (09/25/15 1100)  . sodium chloride    . dexmedetomidine Stopped (10/13/15 0200)  . epinephrine 3 mcg/min (10/13/15 0700)  . feeding supplement (OSMOLITE 1.5 CAL) 1,000 mL (10/13/15 0700)  . heparin 10,000 units/ 20 mL infusion syringe 800 Units/hr (10/13/15 0700)  . lactated ringers Stopped (09/26/15 0000)  . lactated ringers Stopped (09/24/15 0700)  . milrinone 0.25 mcg/kg/min (10/13/15 0700)  . norepinephrine (LEVOPHED) Adult infusion 7 mcg/min (10/13/15 0700)  . dialysis replacement fluid (prismasate) 300 mL/hr at 10/13/15 0353  . dialysis replacement fluid (prismasate) 200 mL/hr at 10/12/15 0955  . dialysate (PRISMASATE) 1,000 mL/hr at 10/13/15 0643    PRN Medications: sodium chloride, Place/Maintain arterial line **AND** sodium chloride, acetaminophen, albumin human, fentaNYL (SUBLIMAZE) injection, Gerhardt's butt cream, heparin, heparin, hydrALAZINE, ondansetron (ZOFRAN) IV, oxyCODONE, sodium chloride, sodium chloride   Assessment/Plan   1. Acute on chronic systolic HF -> Cardiogenic shock: NICM with EF 5%, mild to moderate RV hypokinesis.  S/p St Jude CRT-D. s/p HM II LVAD placement 09/06/2015.  -Continues on CVVHD.   -Remains on levophed, epi and milrinone. Wean slowly once extubated.  D/W Dr. Donata Clay.  2. Acute respiratory failure -intubated 1/13 for respiratory  distress. S/p left chest tube for effusion. On cefipime. Weaning now. Hopefully can extubate tomorrow per Dr. Donata Clay 3. Septic shock: Now with fungemia and stenotrophamonas in sputum -  On cefipime and andulafungin. ID following.  - Wean pressors as tolerated 4. Hypoglycemia -improved 5. Acute on chronic renal failure, stage V -due to ATN. On CVVHD 6. Atrial fibrillation: Paroxysmal.    - Maintaining sinus  - On coumadin and po amiodarone. INR 2.05  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 Urology consulted before LVAD with foley placed. Foley was removed 10/05/15. Urology placed foley 1/11.   8. Anemia- Overall received 2UPRBCs. . Hgb trending down 11.1>8.3  Give 1UPRBCs 9. L Pleural Effusion- S/P chest tube. Output slowing.    10. Anticoagulation -Continue ASA and couamdin  I reviewed the LVAD parameters from today, and compared the results to the patient's prior recorded data.  No programming changes were made.  The LVAD is functioning within specified parameters. LVAD interrogation was negative for any significant power changes, alarms or PI events/speed drops.  LVAD equipment check completed and is in good working order.  Back-up equipment present.   Length of Stay: 33  Amy Clegg NP-C   10/13/2015, 8:02 AM    VAD Team --- VAD ISSUES ONLY--- Pager [phone] (7am - 7am)  Patient seen and examined with Tonye Becket, NP. We discussed all aspects of the encounter.  I agree with the assessment and plan as stated above.   Remains critically ill but improving with CRRT and abx/antifungals. Extubated earlier today and respiratory status now stabilized on [place]. Volume status improved. He is anemic today. Will give 1u PRBCs (d/w Dr. Donata Clay). Wean pressors as tolerated. VAD parameters stable.   The patient is critically ill with multiple organ systems failure and requires high complexity decision making for assessment and support, frequent evaluation and titration of therapies,  application of advanced monitoring technologies and extensive interpretation of multiple databases.   Critical Care Time devoted to patient care services described in this note is 40 Minutes.    Shanira Tine,MD 4:40 PM

## 2015-10-13 NOTE — Progress Notes (Signed)
CT surgery HeartMate 2 Rounding Note POD #20  Heartmate 2 implantation   Subjective:   Class 4 CHF with cardiogenic shock, hx nonischemic    Cardiomyopathy Preop IABP Hx BPH, TURP and bladder outlet obstruction required cystoscopy to place foley\ probable UTI preop Preop severe protein loss malnutrition, prealbumin < 8 Pre and Post-op acute on chronic renal failure- required CVVH for postop acute HTN Preop guaiac + stool Preop chronic Eliquis for a-fib Postop acute on chronic anemia Postop fungemia Postop L pleural effusion requiring chest tube x2-  Postop acute respiratory failure requiring intubation   for apnea and possible mucous plugs   Patient with clinical deterioration starting postop day 12 , blood cultures positive for candida species. Pro calcitonin marker positive with clinical signs of sepsis. Antibiotic coverage has been increased and managed by ID. Patient has been replaced on pressors . Cultures have been negative since isolated candida species positive blood culture and patient was placed on antifungal drug Repeat left chest tube 28 French placed  with drainage 1 L of serosanguineous pleural effusion-cultures negative so far. Postop acute renal failure--renal consult placed-patient on CVVH via right IJ temporary dialysis catheter . HD has been effective in normalizing creatinine Echocardiogram  shows good unloading of the LV but with persistent moderate RV dysfunction  Patient much more alert and comfortable Sucessfully extubated today. Breathing comfortably w/ good cough Feeding tube remains at 60/ hr Keep npo for now With extubation have beed able to wean norepi      INR now  2.1- coumadin 3 mg daily  continue aspirin 81 mg VAD parameters satisfactory w isolated PI events , flow at 9200 rpm Currently on continuous tube feeds for protein malnutrition  LVAD INTERROGATION:  HeartMate II LVAD:  Flow 6.3 liters/min, speed 9200 rpm, power 5.2, PI 5.2  Controller  intact  Objective:    Vital Signs:   Temp:  [98.3 F (36.8 C)-99.5 F (37.5 C)] 99.3 F (37.4 C) (01/17 1608) Pulse Rate:  [65-113] 113 (01/17 1600) Resp:  [15-24] 23 (01/17 1600) BP: (85-132)/(63-78) 113/77 mmHg (01/17 1500) SpO2:  [100 %] 100 % (01/17 1600) Arterial Line BP: (85-114)/(67-77) 113/77 mmHg (01/17 1600) FiO2 (%):  [40 %] 40 % (01/17 1001) Weight:  [128 lb 12 oz (58.4 kg)] 128 lb 12 oz (58.4 kg) (01/17 0600) Last BM Date: 10/10/15 Mean arterial Pressure 90-95  Intake/Output:   Intake/Output Summary (Last 24 hours) at 10/13/15 1621 Last data filed at 10/13/15 1600  Gross per 24 hour  Intake 3226.62 ml  Output   3229 ml  Net  -2.38 ml     Physical Exam: General:  Appears weak and fragile. Currently intubated and responsive and more alert HEENT: normal Neck: supple. no JVP  No carotid pulses; no bruits. No lymphadenopathy or thryomegaly appreciated. Cor: Mechanical heart sounds with LVAD hum present. Lungs: clear. Improved breath sounds after placement of left chest tube. Left chest tube drainage now minimal Abdomen: soft, nontender, nondistended. No hepatosplenomegaly. No bruits or masses. Good bowel sounds. Extremities: no cyanosis, clubbing, rash, mild- mod extremity  edema Neuro: alert & orientedx3, cranial nerves grossly intact.             Generally weak with poor ambulation ability after              developing fungemia followed by generalized sepsis  Telemetry: paced rhythm 90  Labs: Basic Metabolic Panel:  Recent Labs Lab 10/10/15 0413  10/10/15 1550 10/11/15 0426  10/11/15 1538  10/12/15 0410 10/12/15 0602 10/12/15 0606 10/12/15 1550 10/13/15 0510  NA 142  --  137 137  < > 138  < > 138 138 141 141 139  K 4.7  --  4.5 4.7  < > 4.4  < > 3.6 3.7 3.7 3.6 3.6  CL 110  --  105 104  < > 98*  < > 93* 89* 86* 94* 97*  CO2 25  --  24 26  --  29  --  36*  --   --  37* 33*  GLUCOSE <20*  < > 110* 158*  < > 239*  < > 180* 250* 287* 127* 78  BUN 49*   --  34* 28*  < > 24*  < > 19 20 18 20  21*  CREATININE 2.53*  --  1.73* 1.36*  < > 1.14  < > 1.09 1.00 0.90 1.15 1.19  CALCIUM 9.5  --  8.7* 8.9  --  9.4  --  10.1  --   --  9.7 9.3  MG 2.3  --   --  2.2  --   --   --  2.1  --   --   --  2.1  PHOS 3.9  --  2.3* 2.0*  --  1.5*  --   --   --   --  1.4* 1.3*  < > = values in this interval not displayed.  Liver Function Tests:  Recent Labs Lab 09/30/2015 0435  10/10/15 0413  10/11/15 0426 10/11/15 1538 10/12/15 0410 10/12/15 1550 10/13/15 0510  AST 52*  --  61*  --  70*  --  79*  --  154*  ALT 23  --  25  --  25  --  28  --  45  ALKPHOS 122  --  122  --  127*  --  138*  --  136*  BILITOT 0.5  --  1.0  --  1.0  --  0.9  --  0.7  PROT 6.0*  --  6.7  --  6.1*  --  5.7*  --  6.0*  ALBUMIN 3.3*  < > 3.6  < > 3.1* 3.0* 3.2* 3.3* 3.7  < > = values in this interval not displayed. No results for input(s): LIPASE, AMYLASE in the last 168 hours. No results for input(s): AMMONIA in the last 168 hours.  CBC:  Recent Labs Lab 10/22/2015 1446 10/10/15 0413 10/11/15 0428  10/12/15 0406 10/12/15 0410 10/12/15 0602 10/12/15 0606 10/13/15 0510  WBC 13.0* 13.3* 14.0*  --   --  12.9*  --   --  13.2*  HGB 10.5* 9.5* 9.8*  < > 11.2* 9.3* 10.5* 11.2* 8.3*  HCT 29.7* 27.8* 28.5*  < > 33.0* 27.4* 31.0* 33.0* 25.6*  MCV 83.4 85.5 85.6  --   --  85.6  --   --  88.3  PLT 933* 811* 876*  --   --  1017*  --   --  979*  < > = values in this interval not displayed.  INR:  Recent Labs Lab 10/12/2015 0435 10/10/15 0413 10/11/15 0426 10/12/15 0410 10/13/15 0510  INR 2.91* 2.54* 2.68* 2.21* 2.05*    Other results:  EKG:   Imaging: Dg Chest Port 1 View  10/13/2015  CLINICAL DATA:  Post LVAD insertion, history chronic systolic heart failure, hypertension EXAM: PORTABLE CHEST 1 VIEW COMPARISON:  Portable exam 0648 hours compared to 10/12/2015 FINDINGS: Tip of endotracheal tube projects 4.0  cm above carina. Nasogastric tube coiled in stomach. LEFT  subclavian AICD leads project over RIGHT atrium, RIGHT ventricle and coronary sinus. LVED again identified. RIGHT jugular central venous catheter tip projects over cavoatrial junction. LEFT thoracostomy tube unchanged. Enlargement of cardiac silhouette. Rotation to the RIGHT. Stable mediastinal contours. RIGHT apex excluded by patient's chin. BILATERAL lower lobe atelectasis versus consolidation. Probable small RIGHT pleural effusion. No gross pneumothorax. IMPRESSION: Numerous support devices unchanged. Enlargement of cardiac silhouette with BILATERAL lower lobe atelectasis versus consolidation. Little interval change. Electronically Signed   By: Ulyses Southward M.D.   On: 10/13/2015 08:14   Dg Chest Port 1 View  10/12/2015  CLINICAL DATA:  Left ventricular assist device. EXAM: PORTABLE CHEST 1 VIEW COMPARISON:  10/11/2015. FINDINGS: Left chest tube, right IJ line, right PICC line in stable position. Drainage catheters projected over the lower chest and or upper abdomen in unchanged position. Prior CABG. Left ventricular assist device in stable position . Cardiac pacer stable position. Cardiomegaly with pulmonary vascular prominence and bilateral interstitial prominence with small right pleural effusion. Findings suggest mild congestive heart failure. Slight interim progression from prior exam. IMPRESSION: 1. Lines and tubes including left chest tube in stable position. No pneumothorax. 2. Prior CABG. Cardiac pacer and left ventricular assist device in stable position. Cardiomegaly with mild bilateral interstitial prominence noted. A mild component congestive heart failure cannot be excluded . Slight interim progression from prior exam . Electronically Signed   By: Maisie Fus  Register   On: 10/12/2015 07:57     Medications:     Scheduled Medications: . albumin human  12.5 g Intravenous Q6H  . anidulafungin  100 mg Intravenous Q24H  . antiseptic oral rinse  7 mL Mouth Rinse 10 times per day  . aspirin  81 mg  Oral Daily  . ceFEPime (MAXIPIME) IV  2 g Intravenous Q12H  . chlorhexidine gluconate  15 mL Mouth Rinse BID  . feeding supplement  237 mL Oral TID WC  . feeding supplement (PRO-STAT SUGAR FREE 64)  30 mL Per Tube BID  . furosemide  40 mg Intravenous Once  . insulin aspart  0-9 Units Subcutaneous 6 times per day  . insulin glargine  15 Units Subcutaneous BID  . levalbuterol  1.25 mg Nebulization Q6H  . pantoprazole sodium  40 mg Per Tube Daily  . sildenafil  40 mg Oral TID  . sodium chloride  10-40 mL Intracatheter Q12H  . warfarin  3 mg Oral q1800  . Warfarin - Physician Dosing Inpatient   Does not apply q1800    Infusions: . sodium chloride Stopped (09/25/15 1100)  . sodium chloride    . dexmedetomidine Stopped (10/13/15 0200)  . epinephrine 3.013 mcg/min (10/13/15 1600)  . feeding supplement (OSMOLITE 1.5 CAL) 1,000 mL (10/13/15 1600)  . heparin 10,000 units/ 20 mL infusion syringe 800 Units/hr (10/13/15 1500)  . lactated ringers Stopped (09/26/15 0000)  . lactated ringers Stopped (09/24/15 0700)  . milrinone 0.25 mcg/kg/min (10/13/15 1600)  . norepinephrine (LEVOPHED) Adult infusion 1 mcg/min (10/13/15 1600)  . dialysis replacement fluid (prismasate) 300 mL/hr at 10/13/15 1229  . dialysis replacement fluid (prismasate) 200 mL/hr at 10/12/15 0955  . dialysate (PRISMASATE) 1,000 mL/hr at 10/13/15 1227    PRN Medications: sodium chloride, Place/Maintain arterial line **AND** sodium chloride, acetaminophen, albumin human, fentaNYL (SUBLIMAZE) injection, Gerhardt's butt cream, heparin, heparin, hydrALAZINE, ondansetron (ZOFRAN) IV, oxyCODONE, sodium chloride, sodium chloride   Assessment:  VAD flow parameters and function are satisfactory Echocardiogram performed earlier  in week shows adequate RV function and good decompression   Clinically he is improving as sepsis has been treated- appreciate ID and Renal input  We'll continue continuous tube feeds to try to improve his  nutritional status--currently with severe protein malnutrition.  Patient's uremia has  cleared with CVVH and chest x-ray has improved significantly. Fever has resolved, mild leukocytosis persists ( WBC 14 K).  Will stop CVVH in about   Plan/Discussion:      I reviewed the LVAD parameters from today, and compared the results to the patient's prior recorded data.  No programming changes were made.  The LVAD is functioning within specified parameters.  The patient performs LVAD self-test daily.  LVAD interrogation was negative for any significant power changes, alarms or PI events/speed drops.  LVAD equipment check completed and is in good working order.  Back-up equipment present.   LVAD education done on emergency procedures and precautions and reviewed exit site care.  Length of Stay: [address]  Kathlee Nations [place] III 10/13/2015, 4:21 PM

## 2015-10-13 NOTE — Procedures (Signed)
Extubation Procedure Note  Patient Details:   Name: Brad [name] DOB: 1941/03/16 MRN: [id number]   Airway Documentation:     Evaluation  O2 sats: stable throughout Complications: No apparent complications Patient did tolerate procedure well. Bilateral Breath Sounds: Clear Suctioning: Airway Yes   Pt. Was extubated to a 4L [place] without any complications, dyspnea or stridor noted. Pt. Achieved a goal of 1.2L on VC & -20 on NIF. Pt. Was instructed on IS, highest goal achieved was .   Carlynn Spry 10/13/2015, 11:35 PM

## 2015-10-13 NOTE — Progress Notes (Signed)
Subjective: Interval History: has no complaint, on vent .  Objective: Vital signs in last 24 hours: Temp:  [98.9 F (37.2 C)-99.6 F (37.6 C)] 98.9 F (37.2 C) (01/17 0345) Pulse Rate:  [65-117] 100 (01/17 0700) Resp:  [15-23] 16 (01/17 0700) BP: (85-132)/(63-77) 98/73 mmHg (01/17 0700) SpO2:  [100 %] 100 % (01/17 0700) Arterial Line BP: (82-172)/(67-88) 98/73 mmHg (01/17 0700) FiO2 (%):  [40 %] 40 % (01/17 0432) Weight:  [58.4 kg (128 lb 12 oz)] 58.4 kg (128 lb 12 oz) (01/17 0600) Weight change: -1.7 kg (-3 lb 12 oz)  Intake/Output from previous day: 01/16 0701 - 01/17 0700 In: 3084.1 [I.V.:874.1; NG/GT:1780; IV Piggyback:430] Out: 3267 [Urine:485; Stool:125; Chest Tube:260] Intake/Output this shift: Total I/O In: -  Out: 70 [Other:70]  General appearance: cooperative and emaciated, awake, coop, on vent Neck: RIJ cath Resp: rales bibasilar and rhonchi bibasilar Cardio: cont hum GI: mod distension pos bs Extremities: edema 2+ edema  LVAD tube RUQ, L CT  Lab Results:  Recent Labs  10/12/15 0410  10/12/15 0606 10/13/15 0510  WBC 12.9*  --   --  13.2*  HGB 9.3*  < > 11.2* 8.3*  HCT 27.4*  < > 33.0* 25.6*  PLT 1017*  --   --  979*  < > = values in this interval not displayed. BMET:  Recent Labs  10/12/15 1550 10/13/15 0510  NA 141 139  K 3.6 3.6  CL 94* 97*  CO2 37* 33*  GLUCOSE 127* 78  BUN 20 21*  CREATININE 1.15 1.19  CALCIUM 9.7 9.3   No results for input(s): PTH in the last 72 hours. Iron Studies: No results for input(s): IRON, TIBC, TRANSFERRIN, FERRITIN in the last 72 hours.  Studies/Results: Dg Chest Port 1 View  10/12/2015  CLINICAL DATA:  Left ventricular assist device. EXAM: PORTABLE CHEST 1 VIEW COMPARISON:  10/11/2015. FINDINGS: Left chest tube, right IJ line, right PICC line in stable position. Drainage catheters projected over the lower chest and or upper abdomen in unchanged position. Prior CABG. Left ventricular assist device in stable  position . Cardiac pacer stable position. Cardiomegaly with pulmonary vascular prominence and bilateral interstitial prominence with small right pleural effusion. Findings suggest mild congestive heart failure. Slight interim progression from prior exam. IMPRESSION: 1. Lines and tubes including left chest tube in stable position. No pneumothorax. 2. Prior CABG. Cardiac pacer and left ventricular assist device in stable position. Cardiomegaly with mild bilateral interstitial prominence noted. A mild component congestive heart failure cannot be excluded . Slight interim progression from prior exam . Electronically Signed   By: Marcello Moores  Register   On: 10/12/2015 07:57    I have reviewed the patient's current medications.  Assessment/Plan: 1 AKI good solute, K.  Alk better off citrate.  Did not metabolize citrate, ??liver prob. Will try off in 24-48h, and see what he does, if fails , resume 2 CM with cardiogenic schock 3 Anemia worse  ? Hemolysis 4 HCAP with exudate effusion, L CT  5 Malnutrition tf 6 hypotension lowering meds 7 PAH 8 Fungemia on antifungals 9 LVAD 10 ?bact infx P CRRT, follow HB, antifungals. Wean, tf.    LOS: 27 days   Bunnie Rehberg L 10/13/2015,8:14 AM

## 2015-10-13 NOTE — Progress Notes (Signed)
Physical Therapy Treatment Patient Details Name: Infantof Bozzi MRN: [id number] DOB: 1941/07/07 Today's Date: 10/13/2015    History of Present Illness 75 y/o male with h/o PAF, previous CVA, HTN, HL, V. 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 Jude CTR-D (initial ICD 2007 with upgrade to CRT in 2015). Pt s/p LVAD placement 12/28.    PT Comments    Due to pt on CRRT, emphasized upper and Lower extremity exercises.  Pt's wife instructed in some basic exercises and retrograde massage in order to help out when she is here.  Follow Up Recommendations  CIR;Supervision/Assistance - 24 hour     Equipment Recommendations  Rolling walker with 5" wheels;3in1 (PT)    Recommendations for Other Services Rehab consult     Precautions / Restrictions Precautions Precautions: Fall;Sternal    Mobility  Bed Mobility               General bed mobility comments: On CRRT so unable to get up.  Transfers                    Ambulation/Gait                 Stairs            Wheelchair Mobility    Modified Rankin (Stroke Patients Only)       Balance                                    Cognition Arousal/Alertness: Awake/alert Behavior During Therapy: Flat affect           Following Commands: Follows one step commands with increased time     Problem Solving: Slow processing      Exercises General Exercises - Lower Extremity Ankle Circles/Pumps: AROM;Strengthening;Both;15 reps;Supine Quad Sets: AROM;Both;10 reps;Supine Gluteal Sets: AROM;Both;Supine;15 reps Hip ABduction/ADduction: AROM;AAROM;Both;10 reps;Supine Straight Leg Raises: AROM;AAROM;Both;10 reps;Supine Hip Flexion/Marching: AROM;Both;10 reps;Supine (graded resistance) Other Exercises Other Exercises: grraded resist with isolated biceps/triceps x 10 resps Other Exercises:  ptt's wife showed basics of retuograde message to bil UE's    General  Comments General comments (skin integrity, edema, etc.): Completed some basic ROM/  Exercises with pt's wife.      Pertinent Vitals/Pain Pain Assessment: Faces Faces Pain Scale: No hurt    Home Living                      Prior Function            PT Goals (current goals can now be found in the care plan section) Acute Rehab PT Goals PT Goal Formulation: With patient Time For Goal Achievement: 10/23/15 Potential to Achieve Goals: Good Progress towards PT goals: Not progressing toward goals - comment (CRRT limiting mobility  goals updated)    Frequency  Min 3X/week    PT Plan Current plan remains appropriate    Co-evaluation             End of Session Equipment Utilized During Treatment: Oxygen Activity Tolerance: Patient tolerated treatment well Patient left: in bed;with call bell/phone within reach;with family/visitor present     Time: 2010-0712 PT Time Calculation (min) (ACUTE ONLY): 23 min  Charges:  $Therapeutic Exercise: 23-37 mins                    G Codes:  Imogen Maddalena, [name] 10/13/2015, 4:44 PM  10/13/2015  Lipan Bing, PT [phone]  (pager)

## 2015-10-13 NOTE — Progress Notes (Signed)
Hypoglycemic Event  CBG: 68  Treatment: D50 IV 25 mL  Symptoms: None  Follow-up CBG: [id number] CBG Result:80  Possible Reasons for Event: Unknown  Comments/MD notified:    Vanna [place]

## 2015-10-13 NOTE — Progress Notes (Signed)
CSW met at bedside with patient and wife. Patient intubated but alert and able to respond appropriately. Patient's wife staying locally overnight and at bedside this morning. Wife denies any questions at the moment and hopeful to have patient extubated this afternoon. Wife states she plans to stay as patient feels more support and comfort when she is present.CSW offered support and will continue to follow throughout recovery. Raquel Sarna, [place]

## 2015-10-13 NOTE — Progress Notes (Signed)
Contacted by Annice Pih that Ms. Vanloo has several questions regarding status of her husband. Went up to talk with her and discuss the questions she had. Biggest concern over if the sepsis and pneumonia have subsided and what time frame they can consider looking for regarding rehab admission. Discussed that it is very difficult to determine a time frame with current status being he is still in the critical care department and still on temporary dialysis; we are hopeful he will show enough improvement in the coming days and weeks we can better determine a projected time frame. She was very appreciative of questions and felt she had everything answered to the best of my ability. Updated Dr. Donata Clay of conversation as well.   Rexene Alberts, RN VAD Coordinator   Office: [phone] 24/7 VAD Pager: [phone]

## 2015-10-13 NOTE — Progress Notes (Signed)
[facility]   Reason for visit: Follow up on fungemia, pneumonia  Interval History: intubated 1/13, remains on vent.  No new positive cultures.  Candida sensitivities still pending.  Weaning today. Afebrile, WBC 17.   Physical Exam: Constitutional: awake on vent Filed Vitals:   10/13/15 0828 10/13/15 0900  BP:  109/77  Pulse:  100  Temp: 98.9 F (37.2 C)   Resp:  22   patient appears in NAD Eyes: anicteric HENT: no thrush Respiratory: on vent Cardiovascular: LVAD GI: soft, nt, nd Ext: bilateral arm edema, improved  Review of Systems: Gastrointestinal: some loose stool, on tube feeds Patient nods no complaints, non-verbal  Lab Results  Component Value Date   WBC 13.2* 10/13/2015   HGB 8.3* 10/13/2015   HCT 25.6* 10/13/2015   MCV 88.3 10/13/2015   PLT 979* 10/13/2015    Lab Results  Component Value Date   CREATININE 1.19 10/13/2015   BUN 21* 10/13/2015   NA 139 10/13/2015   K 3.6 10/13/2015   CL 97* 10/13/2015   CO2 33* 10/13/2015    Lab Results  Component Value Date   ALT 45 10/13/2015   AST 154* 10/13/2015   ALKPHOS 136* 10/13/2015     Microbiology: Recent Results (from the past 240 hour(s))  Culture, blood (single)     Status: None   Collection Time: 10/05/15 10:32 AM  Result Value Ref Range Status   Specimen Description BLOOD CENTRAL LINE  Final   Special Requests BOTTLES DRAWN AEROBIC AND ANAEROBIC 5CC  Final   Culture NO GROWTH 5 DAYS  Final   Report Status 10/10/2015 FINAL  Final  Culture, Urine     Status: None   Collection Time: 10/06/15  4:30 PM  Result Value Ref Range Status   Specimen Description URINE, CLEAN CATCH  Final   Special Requests Levaquin, anidulafungin  Final   Culture NO GROWTH 1 DAY  Final   Report Status 10/07/2015 FINAL  Final  Urine culture     Status: None   Collection Time: 10/07/15  4:25 PM  Result Value Ref Range Status   Specimen Description URINE, CATHETERIZED  Final   Special  Requests NONE  Final   Culture NO GROWTH 1 DAY  Final   Report Status 10/08/2015 FINAL  Final  Body fluid culture     Status: None   Collection Time: 10/08/15  9:20 AM  Result Value Ref Range Status   Specimen Description FLUID LEFT PLEURAL  Final   Special Requests NONE  Final   Gram Stain   Final    ABUNDANT WBC PRESENT,BOTH PMN AND MONONUCLEAR NO ORGANISMS SEEN    Culture NO GROWTH 3 DAYS  Final   Report Status 10/11/2015 FINAL  Final  Culture, blood (routine x 2)     Status: None (Preliminary result)   Collection Time: 10/08/15 10:20 AM  Result Value Ref Range Status   Specimen Description BLOOD RIGHT HAND  Final   Special Requests BOTTLES DRAWN AEROBIC AND ANAEROBIC 10CC  Final   Culture NO GROWTH 4 DAYS  Final   Report Status PENDING  Incomplete  Culture, blood (routine x 2)     Status: None (Preliminary result)   Collection Time: 10/08/15 10:31 AM  Result Value Ref Range Status   Specimen Description BLOOD LEFT HAND  Final   Special Requests BOTTLES DRAWN AEROBIC AND ANAEROBIC 10CC  Final   Culture NO GROWTH 4 DAYS  Final   Report Status  PENDING  Incomplete  Culture, Urine     Status: None   Collection Time: 10/08/15 12:10 PM  Result Value Ref Range Status   Specimen Description URINE, CATHETERIZED  Final   Special Requests Immunocompromised  Final   Culture NO GROWTH 1 DAY  Final   Report Status 10-26-2015 FINAL  Final  Culture, respiratory (NON-Expectorated)     Status: None   Collection Time: 10/10/15  5:43 AM  Result Value Ref Range Status   Specimen Description TRACHEAL ASPIRATE  Final   Special Requests Normal  Final   Gram Stain   Final    MODERATE WBC PRESENT, PREDOMINANTLY PMN RARE SQUAMOUS EPITHELIAL CELLS PRESENT NO ORGANISMS SEEN Performed at [facility]    Culture   Final    NORMAL OROPHARYNGEAL FLORA Performed at [facility]    Report Status 10/13/2015 FINAL  Final    Impression/Plan:  1. Fungemia - repeat cultures remain  negative.  Sensitivities pending so will continue with anidulafungin.  2.  Fever - last fever 1/13.  No new positive cultures.    3.  Possible pneumonia - sputum culture normal flora. CXR improved, WBC improved.  Unclear if cause of of #2.  Previous Stenotrophomonas, treated for 9 days.  On cefepime, improving respiratory status.

## 2015-10-13 NOTE — Progress Notes (Signed)
OT Cancellation Note  Patient Details Name: Brad [name] MRN: [id number] DOB: 1941/09/25   Cancelled Treatment:    Reason Eval/Treat Not Completed: Medical issues which prohibited therapy. Pt remains intubated, on CRRT and pressors.  Will continue to follow.  Evern Bio 10/13/2015, 8:19 AM  [phone]

## 2015-10-13 NOTE — Progress Notes (Signed)
Patient ID: Brad [name], male   DOB: 1940/10/21, 75 y.o.   MRN: [id number]  SICU Evening Rounds:  Hemodynamically stable on epi 3, milrinone 0.25 MAP 82 Pump parameters stable.  Extubated today Up in chair  Urine output 10 cc/hr. On CRRT.  BMET    Component Value Date/Time   NA 140 10/13/2015 1606   K 4.2 10/13/2015 1606   CL 102 10/13/2015 1606   CO2 29 10/13/2015 1606   GLUCOSE 121* 10/13/2015 1606   BUN 22* 10/13/2015 1606   CREATININE 1.20 10/13/2015 1606   CALCIUM 9.1 10/13/2015 1606   GFRNONAA 58* 10/13/2015 1606   GFRAA >60 10/13/2015 1606    A/P: Doing ok after extubation today. Continue current plans.

## 2015-10-14 ENCOUNTER — Inpatient Hospital Stay (HOSPITAL_COMMUNITY): Payer: Medicare Other

## 2015-10-14 DIAGNOSIS — Z978 Presence of other specified devices: Secondary | ICD-10-CM

## 2015-10-14 DIAGNOSIS — D72829 Elevated white blood cell count, unspecified: Secondary | ICD-10-CM

## 2015-10-14 LAB — CBC
HCT: 27.2 % — ABNORMAL LOW (ref 39.0–52.0)
HCT: 23.9 % — ABNORMAL LOW (ref 39.0–52.0)
Hemoglobin: 9.2 g/dL — ABNORMAL LOW (ref 13.0–17.0)
Hemoglobin: 7.9 g/dL — ABNORMAL LOW (ref 13.0–17.0)
MCH: 29.8 pg (ref 26.0–34.0)
MCH: 28.9 pg (ref 26.0–34.0)
MCHC: 33.8 g/dL (ref 30.0–36.0)
MCHC: 33.1 g/dL (ref 30.0–36.0)
MCV: 88 fL (ref 78.0–100.0)
MCV: 87.5 fL (ref 78.0–100.0)
PLATELETS: 869 10*3/uL — AB (ref 150–400)
Platelets: 865 10*3/uL — ABNORMAL HIGH (ref 150–400)
RBC: 3.09 MIL/uL — ABNORMAL LOW (ref 4.22–5.81)
RBC: 2.73 MIL/uL — AB (ref 4.22–5.81)
RDW: 16.9 % — ABNORMAL HIGH (ref 11.5–15.5)
RDW: 16.8 % — AB (ref 11.5–15.5)
WBC: 15.8 10*3/uL — ABNORMAL HIGH (ref 4.0–10.5)
WBC: 17.8 10*3/uL — ABNORMAL HIGH (ref 4.0–10.5)

## 2015-10-14 LAB — POCT ACTIVATED CLOTTING TIME
ACTIVATED CLOTTING TIME: 203 s
ACTIVATED CLOTTING TIME: 209 s
ACTIVATED CLOTTING TIME: 209 s
Activated Clotting Time: 214 seconds
Activated Clotting Time: 209 seconds

## 2015-10-14 LAB — POCT I-STAT 3, ART BLOOD GAS (G3+)
ACID-BASE EXCESS: 3 mmol/L — AB (ref 0.0–2.0)
ACID-BASE EXCESS: 5 mmol/L — AB (ref 0.0–2.0)
ACID-BASE EXCESS: 5 mmol/L — AB (ref 0.0–2.0)
Acid-Base Excess: 5 mmol/L — ABNORMAL HIGH (ref 0.0–2.0)
Acid-Base Excess: 3 mmol/L — ABNORMAL HIGH (ref 0.0–2.0)
BICARBONATE: 26 meq/L — AB (ref 20.0–24.0)
BICARBONATE: 29.4 meq/L — AB (ref 20.0–24.0)
Bicarbonate: 29 mEq/L — ABNORMAL HIGH (ref 20.0–24.0)
Bicarbonate: 26.4 mEq/L — ABNORMAL HIGH (ref 20.0–24.0)
Bicarbonate: 29.2 mEq/L — ABNORMAL HIGH (ref 20.0–24.0)
O2 SAT: 100 %
O2 SAT: 90 %
O2 SAT: 96 %
O2 SAT: 97 %
O2 Saturation: 100 %
PCO2 ART: 34.2 mmHg — AB (ref 35.0–45.0)
PCO2 ART: 35.9 mmHg (ref 35.0–45.0)
PCO2 ART: 40.2 mmHg (ref 35.0–45.0)
PCO2 ART: 42.7 mmHg (ref 35.0–45.0)
PH ART: 7.468 — AB (ref 7.350–7.450)
PO2 ART: 370 mmHg — AB (ref 80.0–100.0)
PO2 ART: 54 mmHg — AB (ref 80.0–100.0)
Patient temperature: 97.7
Patient temperature: 99
TCO2: 27 mmol/L (ref 0–100)
TCO2: 27 mmol/L (ref 0–100)
TCO2: 30 mmol/L (ref 0–100)
TCO2: 30 mmol/L (ref 0–100)
TCO2: 31 mmol/L (ref 0–100)
pCO2 arterial: 43 mmHg (ref 35.0–45.0)
pH, Arterial: 7.44 (ref 7.350–7.450)
pH, Arterial: 7.444 (ref 7.350–7.450)
pH, Arterial: 7.475 — ABNORMAL HIGH (ref 7.350–7.450)
pH, Arterial: 7.49 — ABNORMAL HIGH (ref 7.350–7.450)
pO2, Arterial: 78 mmHg — ABNORMAL LOW (ref 80.0–100.0)
pO2, Arterial: 83 mmHg (ref 80.0–100.0)
pO2, Arterial: 345 mmHg — ABNORMAL HIGH (ref 80.0–100.0)

## 2015-10-14 LAB — APTT: aPTT: 95 seconds — ABNORMAL HIGH (ref 24–37)

## 2015-10-14 LAB — RENAL FUNCTION PANEL
ALBUMIN: 3.9 g/dL (ref 3.5–5.0)
ANION GAP: 12 (ref 5–15)
Albumin: 3.8 g/dL (ref 3.5–5.0)
Anion gap: 9 (ref 5–15)
BUN: 24 mg/dL — AB (ref 6–20)
BUN: 25 mg/dL — AB (ref 6–20)
CALCIUM: 9.6 mg/dL (ref 8.9–10.3)
CHLORIDE: 102 mmol/L (ref 101–111)
CHLORIDE: 101 mmol/L (ref 101–111)
CO2: 29 mmol/L (ref 22–32)
CO2: 24 mmol/L (ref 22–32)
CREATININE: 1.2 mg/dL (ref 0.61–1.24)
Calcium: 9.4 mg/dL (ref 8.9–10.3)
Creatinine, Ser: 1.28 mg/dL — ABNORMAL HIGH (ref 0.61–1.24)
GFR calc Af Amer: >60 mL/min (ref >60)
GFR, EST NON AFRICAN AMERICAN: 58 mL/min — AB (ref >60)
GFR, EST NON AFRICAN AMERICAN: 53 mL/min — AB (ref >60)
GLUCOSE: 167 mg/dL — AB (ref 65–99)
Glucose, Bld: 146 mg/dL — ABNORMAL HIGH (ref 65–99)
PHOSPHORUS: 3.7 mg/dL (ref 2.5–4.6)
POTASSIUM: 4.1 mmol/L (ref 3.5–5.1)
Phosphorus: 2 mg/dL — ABNORMAL LOW (ref 2.5–4.6)
Potassium: 4 mmol/L (ref 3.5–5.1)
SODIUM: 140 mmol/L (ref 135–145)
Sodium: 137 mmol/L (ref 135–145)

## 2015-10-14 LAB — COMPREHENSIVE METABOLIC PANEL
ALT: 42 U/L (ref 17–63)
AST: 127 U/L — ABNORMAL HIGH (ref 15–41)
Albumin: 3.8 g/dL (ref 3.5–5.0)
Alkaline Phosphatase: 136 U/L — ABNORMAL HIGH (ref 38–126)
Anion gap: 11 (ref 5–15)
BUN: 24 mg/dL — ABNORMAL HIGH (ref 6–20)
CO2: 27 mmol/L (ref 22–32)
Calcium: 9.6 mg/dL (ref 8.9–10.3)
Chloride: 102 mmol/L (ref 101–111)
Creatinine, Ser: 1.17 mg/dL (ref 0.61–1.24)
GFR calc Af Amer: >60 mL/min (ref >60)
GFR calc non Af Amer: 60 mL/min — ABNORMAL LOW (ref >60)
Glucose, Bld: 146 mg/dL — ABNORMAL HIGH (ref 65–99)
Potassium: 4.1 mmol/L (ref 3.5–5.1)
Sodium: 140 mmol/L (ref 135–145)
Total Bilirubin: 1.2 mg/dL (ref 0.3–1.2)
Total Protein: 6.3 g/dL — ABNORMAL LOW (ref 6.5–8.1)

## 2015-10-14 LAB — CARBOXYHEMOGLOBIN
Carboxyhemoglobin: 1.9 % — ABNORMAL HIGH (ref 0.5–1.5)
Carboxyhemoglobin: 2.1 % — ABNORMAL HIGH (ref 0.5–1.5)
Carboxyhemoglobin: 2 % — ABNORMAL HIGH (ref 0.5–1.5)
Carboxyhemoglobin: 1.9 % — ABNORMAL HIGH (ref 0.5–1.5)
Methemoglobin: 1 % (ref 0.0–1.5)
Methemoglobin: 1.3 % (ref 0.0–1.5)
Methemoglobin: 1.2 % (ref 0.0–1.5)
Methemoglobin: 1.5 % (ref 0.0–1.5)
O2 SAT: 43.5 %
O2 Saturation: 50.4 %
O2 Saturation: 54.6 %
O2 Saturation: 55.2 %
TOTAL HEMOGLOBIN: 7.9 g/dL — AB (ref 13.5–18.0)
Total hemoglobin: 8.7 g/dL — ABNORMAL LOW (ref 13.5–18.0)
Total hemoglobin: 8.5 g/dL — ABNORMAL LOW (ref 13.5–18.0)
Total hemoglobin: 8.3 g/dL — ABNORMAL LOW (ref 13.5–18.0)

## 2015-10-14 LAB — GLUCOSE, CAPILLARY
GLUCOSE-CAPILLARY: 142 mg/dL — AB (ref 65–99)
GLUCOSE-CAPILLARY: 153 mg/dL — AB (ref 65–99)
GLUCOSE-CAPILLARY: 85 mg/dL (ref 65–99)
GLUCOSE-CAPILLARY: 98 mg/dL (ref 65–99)
Glucose-Capillary: 132 mg/dL — ABNORMAL HIGH (ref 65–99)
Glucose-Capillary: 139 mg/dL — ABNORMAL HIGH (ref 65–99)
Glucose-Capillary: 165 mg/dL — ABNORMAL HIGH (ref 65–99)
Glucose-Capillary: 67 mg/dL (ref 65–99)

## 2015-10-14 LAB — PHOSPHORUS: Phosphorus: 2 mg/dL — ABNORMAL LOW (ref 2.5–4.6)

## 2015-10-14 LAB — PREPARE RBC (CROSSMATCH)

## 2015-10-14 LAB — MAGNESIUM: MAGNESIUM: 2.2 mg/dL (ref 1.7–2.4)

## 2015-10-14 LAB — PROTIME-INR
INR: 1.87 — ABNORMAL HIGH (ref 0.00–1.49)
Prothrombin Time: 21.4 seconds — ABNORMAL HIGH (ref 11.6–15.2)

## 2015-10-14 LAB — LACTATE DEHYDROGENASE: LDH: 414 U/L — ABNORMAL HIGH (ref 98–192)

## 2015-10-14 MED ORDER — INSULIN GLARGINE 100 UNIT/ML ~~LOC~~ SOLN
10.0000 [IU] | Freq: Every day | SUBCUTANEOUS | Status: DC
  Administered 2015-10-14: 10 [IU] via SUBCUTANEOUS
  Filled 2015-10-14 (×2): qty 0.1

## 2015-10-14 MED ORDER — WARFARIN SODIUM 4 MG PO TABS
4.0000 mg | ORAL_TABLET | Freq: Once | ORAL | Status: AC
  Administered 2015-10-14: 4 mg via ORAL
  Filled 2015-10-14: qty 1

## 2015-10-14 MED ORDER — ALBUMIN HUMAN 5 % IV SOLN: 12.5000 g | Freq: Once | INTRAVENOUS | Status: AC

## 2015-10-14 MED ORDER — SODIUM CHLORIDE 0.9 % IV SOLN
1.0000 g | Freq: Once | INTRAVENOUS | Status: AC
  Administered 2015-10-14: 1 g via INTRAVENOUS
  Filled 2015-10-14: qty 10

## 2015-10-14 MED ORDER — HEPARIN SODIUM (PORCINE) 1000 UNIT/ML DIALYSIS
1000.0000 [IU] | INTRAMUSCULAR | Status: DC | PRN
  Administered 2015-10-14: 1000 [IU] via INTRAVENOUS_CENTRAL
  Filled 2015-10-14 (×3): qty 6

## 2015-10-14 MED ORDER — RENA-VITE PO TABS: 1.0000 | ORAL_TABLET | Freq: Every day | ORAL | Status: DC

## 2015-10-14 MED ORDER — FERUMOXYTOL INJECTION 510 MG/17 ML
510.0000 mg | Freq: Once | INTRAVENOUS | Status: AC
  Administered 2015-10-14: 510 mg via INTRAVENOUS
  Filled 2015-10-14: qty 17

## 2015-10-14 MED ORDER — NOREPINEPHRINE BITARTRATE 1 MG/ML IV SOLN: 4.0000 ug/min | INTRAVENOUS | Status: DC

## 2015-10-14 MED ORDER — SODIUM PHOSPHATE 3 MMOLE/ML IV SOLN
30.0000 mmol | Freq: Once | INTRAVENOUS | Status: AC
  Administered 2015-10-14: 30 mmol via INTRAVENOUS
  Filled 2015-10-14: qty 10

## 2015-10-14 MED ORDER — NOREPINEPHRINE BITARTRATE 1 MG/ML IV SOLN
1.0000 ug/min | INTRAVENOUS | Status: DC
  Administered 2015-10-15: 9 ug/min via INTRAVENOUS
  Filled 2015-10-14: qty 16

## 2015-10-14 MED ORDER — WARFARIN - PHARMACIST DOSING INPATIENT: Freq: Every day | Status: DC

## 2015-10-14 NOTE — Progress Notes (Signed)
CBC called to Dr. Darrick Penna

## 2015-10-14 NOTE — Progress Notes (Signed)
1720 pt stopped breathing, eyes rolled back in head, unresponsive.Patient bagged with 100% O2, Dr. Gala Romney and Dr. Maren Beach paged. Patient later became responsive, following commmads, MOE x4  and talking. Patient placed on bipap per order Dr. Maren Beach. Dr. Maren Beach here.

## 2015-10-14 NOTE — Progress Notes (Signed)
LVAD Coordinator Advanced Heart Failure Rounds:  HM II LVAD Implanted September 24, 2015 as DT by Dr. Donata Clay.   Extubated yesterday; up in chair this am. Requires assist of two getting back to bed, poor wt bearing, very weak.   Vital signs: Temp: 98.1 - 99.5 HR: 110 s A sensed, V paced  ICD SJM CRT-D (defib therapy off, pacing on) Art line BP:  94/84 (90) O2 Sat: 100% with 3 L/[place] O2  Wt:142.6 lbs (preop) > 161..... 135 > 134 > 136 > 134 > 130 > 130 > 130 >143>141>142>133>132>128>128   LVAD interrogation reveals:  Speed: 9200 Flow: 5.4 Power: 5.7 PI: 2.7 Alarms: none Events: 10 - 15 PI events  Fixed speed: 9200 Low speed limit: 8600  No further power elevations > 10w noted since 1/9 with reviewing interrogation. New baseline power appears to be 5 - 6w.  PI low this am since returning to bed 2.7 - 3.1, with increased # of PI events;  MAPS ok.  Labs:  LDH trend: 372 >... > 401 > 522 > 383 (rechecked from 522) > 371>399>362>401>414  INR trend: 1.21 > ...  > 3.34 > 2.68 > 2.91>2.54>2.68>2.21>2.05>1.87  Hgb: 8.1 > 10.5 > 9.5 > 9.8 > 9.3 > 8.3 > 9.2  WBC:  8.9 > ... > 16.4 > 14.7 > 18.4 > 17.5 > 10.9 > 10.3 > 11.5 > 13.0 > 13.3 > 14.0 > 12.9 > 13.2 > 15.8  Co-Ox: 50.4 % Creatinine: 1.6 (preop) > ... > 3.20 > ... > 2.37 > 3.0 > 3.3 > 3.75 > 3.85 > 4.22>3.78>2.53>1.73>1.36>1.09>1.19>1.17  Antithrombotic Management: 09/24/15:  325 mg ASA ---> 09/25/15  09/27/15 ---> 81 mg  09/24/15--> Warfarin started  Infusions: Milrinone 0.25 Epi 3 mcg Levo 4 mcg (weaned off for few hours 10/12/15, but required re-starting)  Dopamine off 10/08/15  OR Blood Products: 4 u FFP 2 u Platelets Cell saver  ICU Blood Products:  09-24-2015--> 2 FFP, 2 pRBC (Hgb 7) 09/24/15 - 2 units pRBC 10/01/15 --> 2 u PRBC 10/07/15 --> 1 uPRBC and 1 FFP 10/01/2015--> 1 uPRBC  10/14/15 ---> 1 unit PRBC  Ventilator: Extubated 09/25/15 NO weaned off. On Sildenafil 40 mg TID for PAH  Re-intubated 10/11/2015 -->  extubated 10/13/15  Infection: 1/2 BCs + for candida 09/29/2015  anidulafungin (ERAXIS) 100 mg QD 10/01/15 >> current  imipenem-cilastatin (Primaxin) 250 mg Q8H 09/29/15 - 10/03/15  Sputum >> stenotrophamonas on 09/30/2015  Levofloxacin 10/06/15 >> current  Septra 10/04/15 >> 10/06/15  Levoquin 10/06/15 --> stopped 10/11/15 Zosyn 2.25 mg q 6 hrs 10/07/15 -10/08/15 Vancomycin LD 10/12/15 Maxipime 1 gm q 24 hrs 10/08/15 >> current Diflucan 09/24/15 ---> 10/01/15 Bactrim 09/17/15 - 10/06/15  Respiratory Events: CT placed for effusion. CT placed for recurrent effusion 10/08/15   CVVH: 10/10/2015 ---> current   Plan as discussed with team: 1.  Albumin for low PI's 2.  Continue Levophed 3   Keep fluid status even on CVVH   Hessie Diener, RN VAD Coordinator  Office: [phone] 24/7 VAD Pager: [phone]

## 2015-10-14 NOTE — Progress Notes (Signed)
Dr. Gala Romney called re:  PI 2.5 range, orders received

## 2015-10-14 NOTE — Progress Notes (Signed)
CT surgery p.m. Rounds  Patient had difficult day-to-day after being extubated yesterday Showing signs of post VAD RV dysfunction with low flow alarm and several PI events, elevated CVP Echo has been ordered but not yet performed Patient had apneic-transient unresponsive episode, now on BiPAP at minimal pressure support with a good ABGs Chest x-ray check this evening without significant change Inotropes have been increased to support RV.  White count has been increasing now 17,000 Cultures have been unrevealing but probably should continue coverage Patient status discussed with Mrs. Piatkowski in the ICU this evening and she understands he has had another difficult day.

## 2015-10-14 NOTE — Progress Notes (Signed)
Subjective: Interval History: has no complaint, doing ok.  Objective: Vital signs in last 24 hours: Temp:  [98.2 F (36.8 C)-99.5 F (37.5 C)] 98.2 F (36.8 C) (01/18 0405) Pulse Rate:  [99-117] 100 (01/18 0700) Resp:  [17-25] 25 (01/18 0700) BP: (80-117)/(65-84) 93/77 mmHg (01/18 0700) SpO2:  [100 %] 100 % (01/18 0700) Arterial Line BP: (80-124)/(63-82) 93/77 mmHg (01/18 0700) FiO2 (%):  [40 %] 40 % (01/17 1001) Weight:  [58.1 kg (128 lb 1.4 oz)] 58.1 kg (128 lb 1.4 oz) (01/18 0500) Weight change: -0.3 kg (-10.6 oz)  Intake/Output from previous day: 01/17 0701 - 01/18 0700 In: 3084.4 [I.V.:707.4; Blood:335; NG/GT:1500; IV Piggyback:542] Out: 3178 [Urine:380; Chest Tube:70] Intake/Output this shift:    General appearance: cooperative and cachectic Neck: RIJ cath Resp: diminished breath sounds LLL and rhonchi LLL Chest wall: LSC ICD Cardio: cont hum GI: mod distension, pos bs, LVAD tube RUQ Extremities: edema 3+  L CT  Lab Results:  Recent Labs  10/13/15 0510 10/14/15 0420  WBC 13.2* 15.8*  HGB 8.3* 9.2*  HCT 25.6* 27.2*  PLT 979* 865*   BMET:  Recent Labs  10/13/15 1606 10/14/15 0420  NA 140 140  140  K 4.2 4.1  4.0  CL 102 102  102  CO2 29 27  29   GLUCOSE 121* 146*  146*  BUN 22* 24*  24*  CREATININE 1.20 1.17  1.20  CALCIUM 9.1 9.6  9.6    Recent Labs  10/12/15 0845  PTH 183*   Iron Studies: No results for input(s): IRON, TIBC, TRANSFERRIN, FERRITIN in the last 72 hours.  Studies/Results: Dg Chest Port 1 View  10/13/2015  CLINICAL DATA:  Post LVAD insertion, history chronic systolic heart failure, hypertension EXAM: PORTABLE CHEST 1 VIEW COMPARISON:  Portable exam 0648 hours compared to 10/12/2015 FINDINGS: Tip of endotracheal tube projects 4.0 cm above carina. Nasogastric tube coiled in stomach. LEFT subclavian AICD leads project over RIGHT atrium, RIGHT ventricle and coronary sinus. LVED again identified. RIGHT jugular central venous  catheter tip projects over cavoatrial junction. LEFT thoracostomy tube unchanged. Enlargement of cardiac silhouette. Rotation to the RIGHT. Stable mediastinal contours. RIGHT apex excluded by patient's chin. BILATERAL lower lobe atelectasis versus consolidation. Probable small RIGHT pleural effusion. No gross pneumothorax. IMPRESSION: Numerous support devices unchanged. Enlargement of cardiac silhouette with BILATERAL lower lobe atelectasis versus consolidation. Little interval change. Electronically Signed   By: Ulyses Southward M.D.   On: 10/13/2015 08:14    I have reviewed the patient's current medications.  Assessment/Plan: 1 AKI/CKD vol xs good solute/acid/base/K.  Will try off CRRT and see what he does. Concern of vol.  Not sure he can clear either 2 Anemia will add 1 dose Fe 3 HPTH add Vit D 4 LVAD/CM per cards 5 ICD 6 HCAP with exudative effusion has CT 7 fungemia on anulidafungin 8 Malnutrition on TF 9 Arrhythmias P D/C CRRT when clots or times out, Neg 50, Fe, AB, CT    LOS: 28 days   Khushbu Pippen L 10/14/2015,7:56 AM

## 2015-10-14 NOTE — Progress Notes (Signed)
CVVH filter clotted.  Treatment d/c'd per M.D. Order.

## 2015-10-14 NOTE — Progress Notes (Addendum)
[facility]   Reason for visit: Follow up on fungemia, pneumonia  Interval History: Extubated yesterday.   No new positive cultures.  Candida sensitivities still pending. Afebrile, WBC up a bit to 15.8.  No complaints.  Cultures reviewed and no new positive cultures.   Physical Exam: Constitutional: awake on vent Filed Vitals:   10/14/15 0930 10/14/15 0945  BP:    Pulse:    Temp:    Resp: 25 25   patient appears in NAD Eyes: anicteric HENT: no thrush Respiratory: on [employer]  Cardiovascular: LVAD GI: soft, nt, nd Ext: bilateral arm edema, improved again today Skin: no rash  Review of Systems: Gastrointestinal: some loose stool, on tube feeds Patient nods no complaints, non-verbal  Lab Results  Component Value Date   WBC 15.8* 10/14/2015   HGB 9.2* 10/14/2015   HCT 27.2* 10/14/2015   MCV 88.0 10/14/2015   PLT 865* 10/14/2015    Lab Results  Component Value Date   CREATININE 1.17 10/14/2015   CREATININE 1.20 10/14/2015   BUN 24* 10/14/2015   BUN 24* 10/14/2015   NA 140 10/14/2015   NA 140 10/14/2015   K 4.1 10/14/2015   K 4.0 10/14/2015   CL 102 10/14/2015   CL 102 10/14/2015   CO2 27 10/14/2015   CO2 29 10/14/2015    Lab Results  Component Value Date   ALT 42 10/14/2015   AST 127* 10/14/2015   ALKPHOS 136* 10/14/2015     Microbiology: Recent Results (from the past 240 hour(s))  Culture, blood (single)     Status: None   Collection Time: 10/05/15 10:32 AM  Result Value Ref Range Status   Specimen Description BLOOD CENTRAL LINE  Final   Special Requests BOTTLES DRAWN AEROBIC AND ANAEROBIC 5CC  Final   Culture NO GROWTH 5 DAYS  Final   Report Status 10/10/2015 FINAL  Final  Culture, Urine     Status: None   Collection Time: 10/06/15  4:30 PM  Result Value Ref Range Status   Specimen Description URINE, CLEAN CATCH  Final   Special Requests Levaquin, anidulafungin  Final   Culture NO GROWTH 1 DAY  Final   Report Status  10/07/2015 FINAL  Final  Urine culture     Status: None   Collection Time: 10/07/15  4:25 PM  Result Value Ref Range Status   Specimen Description URINE, CATHETERIZED  Final   Special Requests NONE  Final   Culture NO GROWTH 1 DAY  Final   Report Status 10/08/2015 FINAL  Final  Body fluid culture     Status: None   Collection Time: 10/08/15  9:20 AM  Result Value Ref Range Status   Specimen Description FLUID LEFT PLEURAL  Final   Special Requests NONE  Final   Gram Stain   Final    ABUNDANT WBC PRESENT,BOTH PMN AND MONONUCLEAR NO ORGANISMS SEEN    Culture NO GROWTH 3 DAYS  Final   Report Status 10/11/2015 FINAL  Final  Culture, blood (routine x 2)     Status: None   Collection Time: 10/08/15 10:20 AM  Result Value Ref Range Status   Specimen Description BLOOD RIGHT HAND  Final   Special Requests BOTTLES DRAWN AEROBIC AND ANAEROBIC 10CC  Final   Culture NO GROWTH 5 DAYS  Final   Report Status 10/13/2015 FINAL  Final  Culture, blood (routine x 2)     Status: None   Collection Time: 10/08/15 10:31 AM  Result Value Ref Range Status   Specimen Description BLOOD LEFT HAND  Final   Special Requests BOTTLES DRAWN AEROBIC AND ANAEROBIC 10CC  Final   Culture NO GROWTH 5 DAYS  Final   Report Status 10/13/2015 FINAL  Final  Culture, Urine     Status: None   Collection Time: 10/08/15 12:10 PM  Result Value Ref Range Status   Specimen Description URINE, CATHETERIZED  Final   Special Requests Immunocompromised  Final   Culture NO GROWTH 1 DAY  Final   Report Status 10/02/2015 FINAL  Final  Culture, respiratory (NON-Expectorated)     Status: None   Collection Time: 10/10/15  5:43 AM  Result Value Ref Range Status   Specimen Description TRACHEAL ASPIRATE  Final   Special Requests Normal  Final   Gram Stain   Final    MODERATE WBC PRESENT, PREDOMINANTLY PMN RARE SQUAMOUS EPITHELIAL CELLS PRESENT NO ORGANISMS SEEN Performed at [facility]    Culture   Final    NORMAL  OROPHARYNGEAL FLORA Performed at [facility]    Report Status 10/13/2015 FINAL  Final    Impression/Plan:  1. Fungemia - repeat cultures remain negative.  Sensitivities pending so will continue with anidulafungin.  2.  Fever - last fever 1/13.  No new positive cultures.  WBC up a bit but extubated, may be reactive.  3.  Possible pneumonia - sputum culture normal flora. CXR improved, WBC improved.  Unclear if cause of of #2.  Previous Stenotrophomonas, treated for 9 days.  On cefepime day 7, will d/c tomorrow if he remains afebrile.

## 2015-10-14 NOTE — Plan of Care (Signed)
Dr. Gala Romney paged re:  PI 3, orders received

## 2015-10-14 NOTE — Progress Notes (Signed)
Patient ID: Brad [name], male   DOB: 09/22/1941, 75 y.o.   MRN: [id number]    Advanced Heart Failure Rounding Note HeartMate 2 Rounding Note  Subjective:   Admitted from [facility] with cardiogenic shock for advanced heart failure as INTERMACS-1.  On 12/22 foley placed by Urology requiring urethral dilation -> UTI . ? Septic shock. Placed on vanco/zosyn. IABP placed 12/23. S/p HM 2 LVAD placement 09/17/2015.   Remains on CVVHD. Tolerating well. Attempting to pull 50/hr.   On Levophed 4, milrinone 0.25 mcg, and epi 3. CVP 14-15. Hgb 9.2. No signs of bleeding.   PIs runnin in low 2s this morning. Map 79. TMax 99.5 overnight. Got albumin this am. WBC trending up 13.2 => 15.8. Now on cefipime and andulafungin.   Blood Cx 09/29/15- 1/2 yeast ->speciation still pending -Lab sent to [facility] Urine Cx 09/29/15 - NG UA 09/29/15- with few bacteria  C. Diff 09/29/15 - Negative Resp Culture 09/25/16 - Negative. Resp Culture 09/30/15 -R are GNR/GPC -> STENOTROPHOMONAS MALTOPHILIA    LVAD INTERROGATION:  HeartMate II LVAD:  Flow 5.9 liters/min, speed 9000, power 5.9 , PI  2.6, ~ 23 PI events   Objective:    Vital Signs:   Temp:  [98.1 F (36.7 C)-99.5 F (37.5 C)] 98.1 F (36.7 C) (01/18 0801) Pulse Rate:  [32-117] 32 (01/18 0745) Resp:  [17-28] 25 (01/18 0930) BP: (80-117)/(65-84) 85/72 mmHg (01/18 0800) SpO2:  [53 %-100 %] 95 % (01/18 0915) Arterial Line BP: (80-124)/(63-84) 81/74 mmHg (01/18 0930) FiO2 (%):  [40 %] 40 % (01/17 1001) Weight:  [128 lb 1.4 oz (58.1 kg)] 128 lb 1.4 oz (58.1 kg) (01/18 0500) Last BM Date: 10/14/15 Mean arterial Pressure 70-80s   Intake/Output:   Intake/Output Summary (Last 24 hours) at 10/14/15 0945 Last data filed at 10/14/15 0900  Gross per 24 hour  Intake 3173.76 ml  Output   3256 ml  Net -82.24 ml     Physical Exam: CVP 14-15  General:Elderly and chronically ill appearing.  HEENT: normal x for panda and [place]. Neck: supple.  RIJ trialysis cath Carotids  2+ bilat; no bruits. No thyromegaly appreciated.  Cor: PMI nondisplaced. RRR. LVAD hum present.  Lungs: Decreased breath sounds Abdomen: soft, NT, mildly distended , no HSM. No bruits or masses. Hypoactive BS Extremities: no cyanosis, clubbing, rash. RUE PICC  trace edema into thighs bilaterally Neuro: intubated but awakens and follows comamnds moves all 4 extremities w/o difficulty GU: Foley with continued hematuria   Telemetry: Reviewed personally, a sensed v pacing 90s   Labs: Basic Metabolic Panel:  Recent Labs Lab 10/10/15 0413  10/11/15 0426  10/11/15 1538  10/12/15 0410  10/12/15 0606 10/12/15 1550 10/13/15 0510 10/13/15 1606 10/14/15 0420  NA 142  < > 137  < > 138  < > 138  < > 141 141 139 140 140  140  K 4.7  < > 4.7  < > 4.4  < > 3.6  < > 3.7 3.6 3.6 4.2 4.1  4.0  CL 110  < > 104  < > 98*  < > 93*  < > 86* 94* 97* 102 102  102  CO2 25  < > 26  --  29  --  36*  --   --  37* 33* 29 27  29   GLUCOSE <20*  < > 158*  < > 239*  < > 180*  < > 287* 127* 78 121* 146*  146*  BUN 49*  < >  28*  < > 24*  < > 19  < > 18 20 21* 22* 24*  24*  CREATININE 2.53*  < > 1.36*  < > 1.14  < > 1.09  < > 0.90 1.15 1.19 1.20 1.17  1.20  CALCIUM 9.5  < > 8.9  --  9.4  --  10.1  --   --  9.7 9.3 9.1 9.6  9.6  MG 2.3  --  2.2  --   --   --  2.1  --   --   --  2.1  --  2.2  PHOS 3.9  < > 2.0*  --  1.5*  --   --   --   --  1.4* 1.3* 3.4 2.0*  2.0*  < > = values in this interval not displayed.  Liver Function Tests:  Recent Labs Lab 10/10/15 0413  10/11/15 0426  10/12/15 0410 10/12/15 1550 10/13/15 0510 10/13/15 1606 10/14/15 0420  AST 61*  --  70*  --  79*  --  154*  --  127*  ALT 25  --  25  --  28  --  45  --  42  ALKPHOS 122  --  127*  --  138*  --  136*  --  136*  BILITOT 1.0  --  1.0  --  0.9  --  0.7  --  1.2  PROT 6.7  --  6.1*  --  5.7*  --  6.0*  --  6.3*  ALBUMIN 3.6  < > 3.1*  < > 3.2* 3.3* 3.7 3.5 3.8  3.8  < > = values in this interval not displayed. No results  for input(s): LIPASE, AMYLASE in the last 168 hours. No results for input(s): AMMONIA in the last 168 hours.  CBC:  Recent Labs Lab 10/10/15 0413 10/11/15 0428  10/12/15 0410 10/12/15 0602 10/12/15 0606 10/13/15 0510 10/14/15 0420  WBC 13.3* 14.0*  --  12.9*  --   --  13.2* 15.8*  HGB 9.5* 9.8*  < > 9.3* 10.5* 11.2* 8.3* 9.2*  HCT 27.8* 28.5*  < > 27.4* 31.0* 33.0* 25.6* 27.2*  MCV 85.5 85.6  --  85.6  --   --  88.3 88.0  PLT 811* 876*  --  1017*  --   --  979* 865*  < > = values in this interval not displayed.  INR:  Recent Labs Lab 10/10/15 0413 10/11/15 0426 10/12/15 0410 10/13/15 0510 10/14/15 0420  INR 2.54* 2.68* 2.21* 2.05* 1.87*    Other results:    Imaging: Dg Chest Port 1 View  10/14/2015  CLINICAL DATA:  LVAD EXAM: PORTABLE CHEST 1 VIEW COMPARISON:  10/13/2015 FINDINGS: Interval extubation. Right central line, left AICD, left chest tube and LVAD remain in place, unchanged. Cardiomegaly with vascular congestion. Bibasilar airspace opacities are noted, left greater than right. No real change since prior study. No significant effusions. IMPRESSION: Interval extubation. Continued bibasilar atelectasis or infiltrates, not significantly changed. Cardiomegaly, vascular congestion. Electronically Signed   By: Charlett Nose M.D.   On: 10/14/2015 08:16   Dg Chest Port 1 View  10/13/2015  CLINICAL DATA:  Post LVAD insertion, history chronic systolic heart failure, hypertension EXAM: PORTABLE CHEST 1 VIEW COMPARISON:  Portable exam 0648 hours compared to 10/12/2015 FINDINGS: Tip of endotracheal tube projects 4.0 cm above carina. Nasogastric tube coiled in stomach. LEFT subclavian AICD leads project over RIGHT atrium, RIGHT ventricle and coronary sinus. LVED again identified. RIGHT jugular  central venous catheter tip projects over cavoatrial junction. LEFT thoracostomy tube unchanged. Enlargement of cardiac silhouette. Rotation to the RIGHT. Stable mediastinal contours. RIGHT  apex excluded by patient's chin. BILATERAL lower lobe atelectasis versus consolidation. Probable small RIGHT pleural effusion. No gross pneumothorax. IMPRESSION: Numerous support devices unchanged. Enlargement of cardiac silhouette with BILATERAL lower lobe atelectasis versus consolidation. Little interval change. Electronically Signed   By: Ulyses Southward M.D.   On: 10/13/2015 08:14     Medications:     Scheduled Medications: . albumin human  12.5 g Intravenous Q6H  . anidulafungin  100 mg Intravenous Q24H  . antiseptic oral rinse  7 mL Mouth Rinse q12n4p  . aspirin  81 mg Oral Daily  . ceFEPime (MAXIPIME) IV  2 g Intravenous Q12H  . chlorhexidine  15 mL Mouth Rinse BID  . feeding supplement  237 mL Oral TID WC  . feeding supplement (PRO-STAT SUGAR FREE 64)  30 mL Per Tube BID  . insulin aspart  0-9 Units Subcutaneous 6 times per day  . insulin glargine  10 Units Subcutaneous QHS  . levalbuterol  1.25 mg Nebulization Q6H  . multivitamin  1 tablet Oral QHS  . pantoprazole sodium  40 mg Per Tube Daily  . sildenafil  40 mg Oral TID  . sodium chloride  10-40 mL Intracatheter Q12H  . sodium phosphate  Dextrose 5% IVPB  30 mmol Intravenous Once  . warfarin  4 mg Oral ONCE-1800  . Warfarin - Pharmacist Dosing Inpatient   Does not apply q1800    Infusions: . sodium chloride Stopped (09/25/15 1100)  . sodium chloride 250 mL (10/14/15 0900)  . dexmedetomidine Stopped (10/13/15 0200)  . epinephrine 3.013 mcg/min (10/14/15 0900)  . feeding supplement (OSMOLITE 1.5 CAL) 1,000 mL (10/14/15 0900)  . heparin 10,000 units/ 20 mL infusion syringe 800 Units/hr (10/14/15 0900)  . lactated ringers Stopped (09/26/15 0000)  . lactated ringers Stopped (09/24/15 0700)  . milrinone 0.25 mcg/kg/min (10/14/15 0900)  . norepinephrine (LEVOPHED) Adult infusion    . dialysis replacement fluid (prismasate) 300 mL/hr at 10/14/15 0430  . dialysis replacement fluid (prismasate) 200 mL/hr at 10/12/15 0955  .  dialysate (PRISMASATE) 1,000 mL/hr at 10/14/15 0633    PRN Medications: sodium chloride, Place/Maintain arterial line **AND** sodium chloride, acetaminophen, albumin human, fentaNYL (SUBLIMAZE) injection, Gerhardt's butt cream, heparin, heparin, hydrALAZINE, ondansetron (ZOFRAN) IV, oxyCODONE, sodium chloride, sodium chloride   Assessment/Plan   1. Acute on chronic systolic HF -> Cardiogenic shock: NICM with EF 5%, mild to moderate RV hypokinesis.  S/p St Jude CRT-D. s/p HM II LVAD placement 09/01/2015.  -Continues on CVVHD. Ordered for Negative 50. We would like to keep even for now. Nurse aware. - Speed decreased to 9000 per Dr. Gala Romney   -Remains on levophed, epi and milrinone. Wean slowly once extubated.  Orders for levophed to be stopped.  Will continue at 4 for now, may need to come back up.  2. Acute respiratory failure -intubated 1/13 for respiratory distress. S/p left chest tube for effusion. On cefipime. Weaning now. Hopefully can extubate tomorrow per Dr. Donata Clay 3. Septic shock: Now with fungemia and stenotrophamonas in sputum -  On cefipime and andulafungin. ID asked to confirm proper coverage.  4. Hypoglycemia - Stable. 5. Acute on chronic renal failure, stage V - On CVVHD currently.  6. Atrial fibrillation: Paroxysmal.    - NSR currently. On coumadin and po amiodarone. INR 1.87. Pharmacy dosing. 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 Urology  consulted before LVAD with foley placed. Foley was removed 10/05/15. Urology placed foley 1/11.   8. Anemia- Overall received 2UPRBCs. . Hgb trending down 11.1>8.3 >9.2 s/p 1 UPRBCs 9. L Pleural Effusion  S/P chest tube. - Output decreasing.    10. Anticoagulation -Continue ASA and couamdin  I reviewed the LVAD parameters from today, and compared the results to the patient's prior recorded data.  No programming changes were made.  The LVAD is functioning within specified parameters. LVAD interrogation was negative for any  significant power changes, alarms or PI events/speed drops.  LVAD equipment check completed and is in good working order.  Back-up equipment present.   Length of Stay: [address]  Graciella Freer PA-C 10/14/2015, 9:45 AM   Advanced Heart Failure Team Pager [phone] (M-F; 7a - 4p)  Please contact [facility] for night-coverage after hours (4p -7a ) and weekends on [email or web address]  VAD Team --- VAD ISSUES ONLY--- Pager [phone] (7am - 7am)  Patient seen and examined with Otilio Saber, PA-C. We discussed all aspects of the encounter. I agree with the assessment and plan as stated above.   Improved on CVVHD but remains extremely tenuous. PIs now < 3. Co-ox 50%.CVP 14-15. Hgb stable. Has recurrent low grade fevers with increasing WBC. Weight down 15 pounds with CVVHD. Will run CVVHD even. VAD speed turned down to 9000. Levophed turned up to 6. Have discussed with ID and they recommend holding the course with anitmicrobial therapy.   The patient is critically ill with multiple organ systems failure and requires high complexity decision making for assessment and support, frequent evaluation and titration of therapies, application of advanced monitoring technologies and extensive interpretation of multiple databases.   Critical Care Time devoted to patient care services described in this note is 40 Minutes.  Tamiah Dysart,MD 10:46 AM

## 2015-10-14 NOTE — Progress Notes (Signed)
PT Cancellation Note  Patient Details Name: Cyril Probus MRN: [id number] DOB: Nov 21, 1940   Cancelled Treatment:    Reason Eval/Treat Not Completed: Medical issues which prohibited therapy.  Pt still on CRRT and unable to mobilize.  We attempt to see tomorrow as able. 10/14/2015  [place] Bing, PT [phone]  (pager)   Hokulani Rogel, [name] 10/14/2015, 1:58 PM

## 2015-10-14 NOTE — Progress Notes (Signed)
LVAD Exit Site:  VAD dressing removed and site care performed using sterile technique. Drive line exit site cleaned with Chlora prep applicators x 2, allowed to dry, and gauze dressing with aquacel strip re-applied. Exit site with moderate tissue ingrowth, distal suture intact, proximal suture with skin pulling apart; both sutures removed.  The velour is fully implanted at exit site. No redness, drainage, tenderness, or foul odor noted.       Drive line anchor seated well. Driveline dressing is being changed daily per sterile technique.  Hessie Diener, RN VAD Coordinator  Office: [phone] 24/7 VAD Pager: [phone]

## 2015-10-14 NOTE — Progress Notes (Signed)
Cap CBG 67, arterial CBG 85, will continue to monitor

## 2015-10-14 NOTE — Progress Notes (Signed)
ANTICOAGULATION CONSULT NOTE - Initial Consult  Pharmacy Consult for warfarin Indication: LVAD, chronic afib  No Known Allergies  Patient Measurements: Height: 5\' 7"  (170.2 cm) Weight: 128 lb 1.4 oz (58.1 kg) IBW/kg (Calculated) : 66.1  Vital Signs: Temp: 98.1 F (36.7 C) (01/18 0801) Temp Source: Oral (01/18 0801) BP: 85/72 mmHg (01/18 0800) Pulse Rate: 32 (01/18 0745)  Labs:  Recent Labs  10/12/15 0410  10/12/15 0606 10/12/15 1550 10/13/15 0510 10/13/15 1606 10/14/15 0420  HGB 9.3*  < > 11.2*  --  8.3*  --  9.2*  HCT 27.4*  < > 33.0*  --  25.6*  --  27.2*  PLT 1017*  --   --   --  979*  --  865*  APTT  --   --   --  109* 119*  --  95*  LABPROT 24.3*  --   --   --  23.0*  --  21.4*  INR 2.21*  --   --   --  2.05*  --  1.87*  CREATININE 1.09  < > 0.90 1.15 1.19 1.20 1.17  1.20  < > = values in this interval not displayed.  Estimated Creatinine Clearance: 45.5 mL/min (by C-G formula based on Cr of 1.17).   Medical History: Past Medical History  Diagnosis Date  . 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 CKD (chronic kidney disease) stage 3, GFR 30-59 ml/min   . V-tach (HCC)   . PAF (paroxysmal atrial fibrillation) (HCC)   . CVA (cerebral infarction)   . BPH (benign prostatic hyperplasia)   . HTN (hypertension), benign   . Hyperlipidemia    Assessment: 75 year old male with chronic afib on eliquis pta. Patient now s/p LVAD 12/28. CVTS has been managing his warfarin therapy, d/w PVT this am and pharmacy ok to take over management.  INR trending down over the past few days, currently low at 1.87. Hgb up today to 9.2, plt 203. Will give slightly higher dose tonight. No bleeding issues noted.  ID: no new fevers since 1/13, wbc up slightly to 15. No new culture data. Fungal sensitivities back next week. ID plans to stop cefepime tomorrow. CRRT to stop once filter clots.  Goal of Therapy:  INR goal 2-2.5 Monitor platelets by  anticoagulation protocol: Yes   Plan:  Warfarin 4mg  tonight Continue daily INR  Sheppard Coil PharmD., BCPS Clinical Pharmacist Pager [phone] 10/14/2015 1:30 PM

## 2015-10-14 NOTE — Progress Notes (Signed)
CSW met at bedside with patient. He reported that he was out of bed this morning for awhile and just got back in bed. Patient tired from sitting up in chair this morning. Patient's wife in early this morning but left to go home to get some things done at home and plans to return this afternoon. She will stay again tonight in [place]. CSW provided support to patient and will continue to follow throughout recovery. Raquel Sarna, [place]

## 2015-10-14 NOTE — Progress Notes (Signed)
OT Cancellation Note  Patient Details Name: Brad [name] MRN: [id number] DOB: 1941-08-26   Cancelled Treatment:    Reason Eval/Treat Not Completed: Medical issues which prohibited therapy. RN requesting therapy to hold today, pt medically unstable. Will continue to follow.  Evern Bio 10/14/2015, 2:57 PM  [phone]

## 2015-10-14 NOTE — Plan of Care (Signed)
1915 red alarm on VAD( no flow), no audible hum  heard, pt unresponsive,  Dr. Maren Beach paged, changed to backup controller per instructions with Rexene Alberts, pt alert and following commands after controller changed.

## 2015-10-14 NOTE — Progress Notes (Signed)
Chaplain was paged by the care team due to spouse of pt having a difficult time dealing with Pt condition. Chaplain got an assessment from the nurse. The spouse was in the waiting area. Chaplain listened to the spouse and heard the difficulty the spouse was having. They were from out of the area and didn't know anyone here. They lived 3 hours away and had no support. Chaplaian prayed with the spouse and offered support. They then went into the room and prayed for the pt. The Pt and the spouse seemed to be at abetter place . I will check on them tomorrow before I leave   10/14/15 1900  Clinical Encounter Type  Visited With Patient and family together  Visit Type Spiritual support;Critical Care  Referral From Care management  Spiritual Encounters  Spiritual Needs Prayer;Emotional  Stress Factors  Patient Stress Factors Health changes  Family Stress Factors Health changes

## 2015-10-14 NOTE — Plan of Care (Signed)
Dr. Gala Romney paged re: coox [phone]

## 2015-10-14 NOTE — Plan of Care (Signed)
po2 54 and [place] at 3l/min, increased to 6l/min, unable to pick up sat

## 2015-10-15 ENCOUNTER — Inpatient Hospital Stay (HOSPITAL_COMMUNITY): Payer: Medicare Other

## 2015-10-15 ENCOUNTER — Encounter (HOSPITAL_COMMUNITY)
Admission: AD | Disposition: E | Payer: Self-pay | Source: Other Acute Inpatient Hospital | Attending: Cardiothoracic Surgery

## 2015-10-15 ENCOUNTER — Ambulatory Visit (HOSPITAL_COMMUNITY): Payer: Medicare Other

## 2015-10-15 ENCOUNTER — Ambulatory Visit (HOSPITAL_BASED_OUTPATIENT_CLINIC_OR_DEPARTMENT_OTHER): Payer: Medicare Other

## 2015-10-15 DIAGNOSIS — I5043 Acute on chronic combined systolic (congestive) and diastolic (congestive) heart failure: Secondary | ICD-10-CM

## 2015-10-15 DIAGNOSIS — I4891 Unspecified atrial fibrillation: Secondary | ICD-10-CM

## 2015-10-15 DIAGNOSIS — I309 Acute pericarditis, unspecified: Secondary | ICD-10-CM

## 2015-10-15 DIAGNOSIS — Z95811 Presence of heart assist device: Secondary | ICD-10-CM

## 2015-10-15 LAB — COMPREHENSIVE METABOLIC PANEL
ALT: 49 U/L (ref 17–63)
AST: 164 U/L — AB (ref 15–41)
Albumin: 4.2 g/dL (ref 3.5–5.0)
Alkaline Phosphatase: 171 U/L — ABNORMAL HIGH (ref 38–126)
Anion gap: 9 (ref 5–15)
BILIRUBIN TOTAL: 2 mg/dL — AB (ref 0.3–1.2)
BUN: 32 mg/dL — AB (ref 6–20)
CALCIUM: 9.4 mg/dL (ref 8.9–10.3)
CO2: 24 mmol/L (ref 22–32)
Chloride: 106 mmol/L (ref 101–111)
Creatinine, Ser: 1.72 mg/dL — ABNORMAL HIGH (ref 0.61–1.24)
GFR calc Af Amer: 43 mL/min — ABNORMAL LOW (ref >60)
GFR, EST NON AFRICAN AMERICAN: 37 mL/min — AB (ref >60)
Glucose, Bld: 166 mg/dL — ABNORMAL HIGH (ref 65–99)
Potassium: 4.4 mmol/L (ref 3.5–5.1)
Sodium: 139 mmol/L (ref 135–145)
TOTAL PROTEIN: 6.4 g/dL — AB (ref 6.5–8.1)

## 2015-10-15 LAB — CBC
HCT: 26.2 % — ABNORMAL LOW (ref 39.0–52.0)
HCT: 26.5 % — ABNORMAL LOW (ref 39.0–52.0)
Hemoglobin: 8.9 g/dL — ABNORMAL LOW (ref 13.0–17.0)
Hemoglobin: 9 g/dL — ABNORMAL LOW (ref 13.0–17.0)
MCH: 29.4 pg (ref 26.0–34.0)
MCH: 29.9 pg (ref 26.0–34.0)
MCHC: 34 g/dL (ref 30.0–36.0)
MCHC: 34 g/dL (ref 30.0–36.0)
MCV: 86.5 fL (ref 78.0–100.0)
MCV: 88 fL (ref 78.0–100.0)
Platelets: 701 10*3/uL — ABNORMAL HIGH (ref 150–400)
Platelets: 748 10*3/uL — ABNORMAL HIGH (ref 150–400)
RBC: 3.03 MIL/uL — ABNORMAL LOW (ref 4.22–5.81)
RBC: 3.01 MIL/uL — ABNORMAL LOW (ref 4.22–5.81)
RDW: 15.6 % — ABNORMAL HIGH (ref 11.5–15.5)
RDW: 16 % — ABNORMAL HIGH (ref 11.5–15.5)
WBC: 15.9 10*3/uL — ABNORMAL HIGH (ref 4.0–10.5)
WBC: 22.2 10*3/uL — ABNORMAL HIGH (ref 4.0–10.5)

## 2015-10-15 LAB — BASIC METABOLIC PANEL
Anion gap: 12 (ref 5–15)
BUN: 39 mg/dL — ABNORMAL HIGH (ref 6–20)
CO2: 23 mmol/L (ref 22–32)
Calcium: 10 mg/dL (ref 8.9–10.3)
Chloride: 104 mmol/L (ref 101–111)
Creatinine, Ser: 2.15 mg/dL — ABNORMAL HIGH (ref 0.61–1.24)
GFR calc Af Amer: 33 mL/min — ABNORMAL LOW (ref >60)
GFR calc non Af Amer: 29 mL/min — ABNORMAL LOW (ref >60)
Glucose, Bld: 140 mg/dL — ABNORMAL HIGH (ref 65–99)
Potassium: 4.7 mmol/L (ref 3.5–5.1)
Sodium: 139 mmol/L (ref 135–145)

## 2015-10-15 LAB — PHOSPHORUS: PHOSPHORUS: 3.3 mg/dL (ref 2.5–4.6)

## 2015-10-15 LAB — POCT I-STAT 3, ART BLOOD GAS (G3+)
Bicarbonate: 24.4 mEq/L — ABNORMAL HIGH (ref 20.0–24.0)
O2 SAT: 98 %
PCO2 ART: 36.5 mmHg (ref 35.0–45.0)
TCO2: 25 mmol/L (ref 0–100)
pH, Arterial: 7.43 (ref 7.350–7.450)
pO2, Arterial: 95 mmHg (ref 80.0–100.0)

## 2015-10-15 LAB — POCT I-STAT, CHEM 8
BUN: 33 mg/dL — AB (ref 6–20)
CALCIUM ION: 1.23 mmol/L (ref 1.13–1.30)
Chloride: 102 mmol/L (ref 101–111)
Creatinine, Ser: 1.5 mg/dL — ABNORMAL HIGH (ref 0.61–1.24)
GLUCOSE: 164 mg/dL — AB (ref 65–99)
HCT: 30 % — ABNORMAL LOW (ref 39.0–52.0)
Hemoglobin: 10.2 g/dL — ABNORMAL LOW (ref 13.0–17.0)
Potassium: 4.4 mmol/L (ref 3.5–5.1)
SODIUM: 139 mmol/L (ref 135–145)
TCO2: 23 mmol/L (ref 0–100)

## 2015-10-15 LAB — TYPE AND SCREEN
ABO/RH(D): B POS
Antibody Screen: NEGATIVE
UNIT DIVISION: 0
Unit division: 0
Unit division: 0

## 2015-10-15 LAB — CARBOXYHEMOGLOBIN
Carboxyhemoglobin: 1.5 % (ref 0.5–1.5)
Carboxyhemoglobin: 1.7 % — ABNORMAL HIGH (ref 0.5–1.5)
Methemoglobin: 1.4 % (ref 0.0–1.5)
Methemoglobin: 1.4 % (ref 0.0–1.5)
O2 Saturation: 57.6 %
O2 Saturation: 64.7 %
Total hemoglobin: 8.5 g/dL — ABNORMAL LOW (ref 13.5–18.0)
Total hemoglobin: 8.5 g/dL — ABNORMAL LOW (ref 13.5–18.0)

## 2015-10-15 LAB — PROTIME-INR
INR: 2.26 — ABNORMAL HIGH (ref 0.00–1.49)
Prothrombin Time: 24.7 seconds — ABNORMAL HIGH (ref 11.6–15.2)

## 2015-10-15 LAB — APTT: aPTT: 53 seconds — ABNORMAL HIGH (ref 24–37)

## 2015-10-15 LAB — RENAL FUNCTION PANEL
Albumin: 4.1 g/dL (ref 3.5–5.0)
Anion gap: 9 (ref 5–15)
BUN: 33 mg/dL — ABNORMAL HIGH (ref 6–20)
CALCIUM: 9.5 mg/dL (ref 8.9–10.3)
CHLORIDE: 106 mmol/L (ref 101–111)
CO2: 24 mmol/L (ref 22–32)
CREATININE: 1.72 mg/dL — AB (ref 0.61–1.24)
GFR, EST AFRICAN AMERICAN: 43 mL/min — AB (ref >60)
GFR, EST NON AFRICAN AMERICAN: 37 mL/min — AB (ref >60)
Glucose, Bld: 167 mg/dL — ABNORMAL HIGH (ref 65–99)
Phosphorus: 3.2 mg/dL (ref 2.5–4.6)
Potassium: 4.4 mmol/L (ref 3.5–5.1)
SODIUM: 139 mmol/L (ref 135–145)

## 2015-10-15 LAB — GLUCOSE, CAPILLARY
Glucose-Capillary: 145 mg/dL — ABNORMAL HIGH (ref 65–99)
Glucose-Capillary: 130 mg/dL — ABNORMAL HIGH (ref 65–99)
Glucose-Capillary: 113 mg/dL — ABNORMAL HIGH (ref 65–99)

## 2015-10-15 LAB — MAGNESIUM: Magnesium: 2.3 mg/dL (ref 1.7–2.4)

## 2015-10-15 LAB — LACTATE DEHYDROGENASE: LDH: 473 U/L — ABNORMAL HIGH (ref 98–192)

## 2015-10-15 SURGERY — CREATION, PERICARDIAL WINDOW, SUBXIPHOID APPROACH
Anesthesia: General

## 2015-10-15 MED ORDER — ALBUMIN HUMAN 5 % IV SOLN
INTRAVENOUS | Status: AC
  Administered 2015-10-15: 12.5 g
  Filled 2015-10-15: qty 250

## 2015-10-15 MED ORDER — SODIUM CHLORIDE 0.9 % IV SOLN
Freq: Once | INTRAVENOUS | Status: AC
  Administered 2015-10-15: 11:00:00 via INTRAVENOUS

## 2015-10-15 MED ORDER — FUROSEMIDE 10 MG/ML IJ SOLN
80.0000 mg | Freq: Once | INTRAMUSCULAR | Status: AC
  Administered 2015-10-15: 80 mg via INTRAVENOUS
  Filled 2015-10-15: qty 8

## 2015-10-15 MED ORDER — SODIUM CHLORIDE 0.9 % IV SOLN
Freq: Once | INTRAVENOUS | Status: AC
  Administered 2015-10-15: 17:00:00 via INTRAVENOUS

## 2015-10-15 NOTE — Progress Notes (Signed)
PT Cancellation Note  Patient Details Name: Brad [name] MRN: [id number] DOB: [birth year]   Cancelled Treatment:    Reason Eval/Treat Not Completed: Medical issues which prohibited therapy.  Pt likely will be moving to comfort care.  Will check tomorrow.     Emanuelle Hammerstrom, [name] 10/17/2015, 11:17 AM

## 2015-10-15 NOTE — Progress Notes (Signed)
Subjective: Interval History: has no complaint, now on bipap, events overnight noted..  Objective: Vital signs in last 24 hours: Temp:  [97 F (36.1 C)-98.1 F (36.7 C)] 97.7 F (36.5 C) (01/19 0400) Pulse Rate:  [29-122] 117 (01/19 0700) Resp:  [21-37] 28 (01/19 0700) BP: (76-123)/(68-95) 99/83 mmHg (01/19 0200) SpO2:  [90 %-100 %] 100 % (01/19 0700) Arterial Line BP: (41-114)/(34-94) 110/87 mmHg (01/19 0700) FiO2 (%):  [40 %-100 %] 40 % (01/19 0400) Weight:  [59.2 kg (130 lb 8.2 oz)] 59.2 kg (130 lb 8.2 oz) (01/19 0500) Weight change: 1.1 kg (2 lb 6.8 oz)  Intake/Output from previous day: 01/18 0701 - 01/19 0700 In: 4114.1 [I.V.:1056.1; Blood:635; NG/GT:1460; IV Piggyback:963] Out: 2888 [Urine:295; Stool:402; Chest Tube:100] Intake/Output this shift:    General appearance: cooperative, cachectic and slowed mentation Resp: rales bibasilar and rhonchi bibasilar Cardio: cnt hum GI: mod distension.  pos bs Extremities: edema L CT, LVAD connector RUQ and cool extrem  Lab Results:  Recent Labs  10/14/15 1545 10/21/2015 0326 10/18/2015 0332  WBC 17.8*  --  15.9*  HGB 7.9* 10.2* 8.9*  HCT 23.9* 30.0* 26.2*  PLT 869*  --  701*   BMET:  Recent Labs  10/14/15 1550 10/20/2015 0326 09/30/2015 0332  NA 137 139 139  139  K 4.1 4.4 4.4  4.4  CL 101 102 106  106  CO2 24  --  24  24  GLUCOSE 167* 164* 166*  167*  BUN 25* 33* 32*  33*  CREATININE 1.28* 1.50* 1.72*  1.72*  CALCIUM 9.4  --  9.4  9.5    Recent Labs  10/12/15 0845  PTH 183*   Iron Studies: No results for input(s): IRON, TIBC, TRANSFERRIN, FERRITIN in the last 72 hours.  Studies/Results: Dg Chest Port 1 View  10/22/2015  CLINICAL DATA:  Shortness of breath. EXAM: PORTABLE CHEST 1 VIEW COMPARISON:  10/14/2015. FINDINGS: Right IJ line, feeding tube, left chest tube in stable position. AICD and left ventricular assist device in stable position. Severe cardiomegaly. Persistent bilateral pulmonary mild  interstitial prominence noted consistent with pulmonary edema. Low lung volumes. Small pleural effusions. No pneumothorax . IMPRESSION: 1. Lines and tubes including left chest tube in stable position. No pneumothorax. 2. AICD and left ventricular assist device in stable position. Severe cardiomegaly with mild bilateral pulmonary interstitial prominence and small pleural effusions again noted without interim change. Findings consistent with congestive heart failure. 3. Low lung volumes. Electronically Signed   By: Maisie Fus  Register   On: 10/03/2015 07:13   Dg Chest Port 1 View  10/14/2015  CLINICAL DATA:  Rest air distress for 1 hour. EXAM: PORTABLE CHEST 1 VIEW COMPARISON:  Chest x-ray from earlier same day and chest x-ray dated 10/13/2015. FINDINGS: Marked cardiomegaly is stable. Central pulmonary vascular congestion is stable. Vague opacities at each lung base, most likely small pleural effusions and/or atelectasis. No new lung findings seen. Tubes, lines and support apparatus are stable in the short-term interval. IMPRESSION: No interval change compared to the chest x-ray from earlier same day. Marked cardiomegaly is stable. Central pulmonary vascular congestion is stable. Probable small bilateral pleural effusions and/or atelectasis are stable. Electronically Signed   By: Bary Richard M.D.   On: 10/14/2015 17:43   Dg Chest Port 1 View  10/14/2015  CLINICAL DATA:  LVAD EXAM: PORTABLE CHEST 1 VIEW COMPARISON:  10/13/2015 FINDINGS: Interval extubation. Right central line, left AICD, left chest tube and LVAD remain in place, unchanged. Cardiomegaly with  vascular congestion. Bibasilar airspace opacities are noted, left greater than right. No real change since prior study. No significant effusions. IMPRESSION: Interval extubation. Continued bibasilar atelectasis or infiltrates, not significantly changed. Cardiomegaly, vascular congestion. Electronically Signed   By: Charlett Nose M.D.   On: 10/14/2015 08:16     I have reviewed the patient's current medications.  Assessment/Plan: 1 AKI oliguric, fluid xs on CXR, acid/base/K ok.  Will watch today as planned but suspect will not improve renal function and need decision as to go ahead and intervene 2 CM/LVAD/pacer-defib per CVS and cards 3 Pneu and exudative effusion with CT 4 Anemia worsening 5 PAH 6 Malnutrition 7 Resp failure suspect needs reintub 8Fungemia on antifungal P follow urine vol, chem, cont antifungal, TF   LOS: 29 days   Babe Clenney L 26-Oct-2015,7:56 AM

## 2015-10-15 NOTE — Progress Notes (Signed)
Echocardiogram 2D Echocardiogram limited has been performed.  Brad [name] 10/21/2015, 11:20 AM

## 2015-10-15 NOTE — Progress Notes (Signed)
Patient ID: Brad [name], male   DOB: 1941/03/20, 75 y.o.   MRN: [id number]    Advanced Heart Failure Rounding Note HeartMate 2 Rounding Note  Subjective:    Had a rough night again last night. Brief apneic and unresponsive episode which recovered spontaneously. Flows on VAD low. Speed turned down. Hgb dropped and also received blood.  Now on Bipap for worsening respiratory status.   Echo at bedside this am shows large posterior lateral pericardial effusion. RV compressed with moderately reduced function   LVAD INTERROGATION:  HeartMate II LVAD:  Flow 3.2 liters/min, speed 8800, power 4.0 , PI  3.8, numerous PI events   Objective:    Vital Signs:   Temp:  [97 F (36.1 C)-98.5 F (36.9 C)] 98.5 F (36.9 C) (01/19 0900) Pulse Rate:  [29-122] 122 (01/19 1000) Resp:  [21-37] 27 (01/19 1000) BP: (83-126)/(73-95) 100/83 mmHg (01/19 1000) SpO2:  [90 %-100 %] 100 % (01/19 1000) Arterial Line BP: (41-135)/(34-97) 135/97 mmHg (01/19 1000) FiO2 (%):  [40 %-100 %] 40 % (01/19 1115) Weight:  [59.2 kg (130 lb 8.2 oz)] 59.2 kg (130 lb 8.2 oz) (01/19 0500) Last BM Date: 10/14/15 Mean arterial Pressure 70-80s   Intake/Output:   Intake/Output Summary (Last 24 hours) at 10/14/2015 1151 Last data filed at 10/14/2015 1000  Gross per 24 hour  Intake 3585.55 ml  Output   2036 ml  Net 1549.55 ml     Physical Exam: CVP 14-15  General:Elderly and chronically ill appearing.  HEENT: normal x for panda and Bipap Neck: supple.  RIJ trialysis cath Carotids 2+ bilat; no bruits. No thyromegaly appreciated.  Cor: PMI nondisplaced. RRR. LVAD hum present.  Lungs: Decreased breath sounds Abdomen: soft, NT, mildly distended , no HSM. No bruits or masses. Hypoactive BS Extremities: no cyanosis, clubbing, rash. RUE PICC  No  edema into thighs bilaterally Neuro: intubated but awakens and follows comamnds moves all 4 extremities w/o difficulty GU: Foley with continued hematuria   Telemetry: Reviewed  personally, a sensed v pacing 120   Labs: Basic Metabolic Panel:  Recent Labs Lab 10/11/15 0426  10/12/15 0410  10/13/15 0510 10/13/15 1606 10/14/15 0420 10/14/15 1550 10/04/2015 0326 09/29/2015 0332  NA 137  < > 138  < > 139 140 140  140 137 139 139  139  K 4.7  < > 3.6  < > 3.6 4.2 4.1  4.0 4.1 4.4 4.4  4.4  CL 104  < > 93*  < > 97* 102 102  102 101 102 106  106  CO2 26  < > 36*  < > 33* --  24  24  GLUCOSE 158*  < > 180*  < > 78 121* 146*  146* 167* 164* 166*  167*  BUN 28*  < > 19  < > 21* 22* 24*  24* 25* 33* 32*  33*  CREATININE 1.36*  < > 1.09  < > 1.19 1.20 1.17  1.20 1.28* 1.50* 1.72*  1.72*  CALCIUM 8.9  < > 10.1  < > 9.3 9.1 9.6  9.6 9.4  --  9.4  9.5  MG 2.2  --  2.1  --  2.1  --  2.2  --   --  2.3  PHOS 2.0*  < >  --   < > 1.3* 3.4 2.0*  2.0* 3.7  --  3.3  3.2  < > = values in this interval not displayed.  Liver Function  Tests:  Recent Labs Lab 10/11/15 0426  10/12/15 0410  10/13/15 0510 10/13/15 1606 10/14/15 0420 10/14/15 1550 2015/10/22 0332  AST 70*  --  79*  --  154*  --  127*  --  164*  ALT 25  --  28  --  45  --  42  --  49  ALKPHOS 127*  --  138*  --  136*  --  136*  --  171*  BILITOT 1.0  --  0.9  --  0.7  --  1.2  --  2.0*  PROT 6.1*  --  5.7*  --  6.0*  --  6.3*  --  6.4*  ALBUMIN 3.1*  < > 3.2*  < > 3.7 3.5 3.8  3.8 3.9 4.2  4.1  < > = values in this interval not displayed. No results for input(s): LIPASE, AMYLASE in the last 168 hours. No results for input(s): AMMONIA in the last 168 hours.  CBC:  Recent Labs Lab 10/12/15 0410  10/13/15 0510 10/14/15 0420 10/14/15 1545 10/22/15 0326 10/22/2015 0332  WBC 12.9*  --  13.2* 15.8* 17.8*  --  15.9*  HGB 9.3*  < > 8.3* 9.2* 7.9* 10.2* 8.9*  HCT 27.4*  < > 25.6* 27.2* 23.9* 30.0* 26.2*  MCV 85.6  --  88.3 88.0 87.5  --  86.5  PLT 1017*  --  979* 865* 869*  --  701*  < > = values in this interval not displayed.  INR:  Recent Labs Lab 10/11/15 0426  10/12/15 0410 10/13/15 0510 10/14/15 0420 10-22-15 0332  INR 2.68* 2.21* 2.05* 1.87* 2.26*    Other results:    Imaging: Dg Chest Port 1 View  22-Oct-2015  CLINICAL DATA:  Shortness of breath. EXAM: PORTABLE CHEST 1 VIEW COMPARISON:  10/14/2015. FINDINGS: Right IJ line, feeding tube, left chest tube in stable position. AICD and left ventricular assist device in stable position. Severe cardiomegaly. Persistent bilateral pulmonary mild interstitial prominence noted consistent with pulmonary edema. Low lung volumes. Small pleural effusions. No pneumothorax . IMPRESSION: 1. Lines and tubes including left chest tube in stable position. No pneumothorax. 2. AICD and left ventricular assist device in stable position. Severe cardiomegaly with mild bilateral pulmonary interstitial prominence and small pleural effusions again noted without interim change. Findings consistent with congestive heart failure. 3. Low lung volumes. Electronically Signed   By: Maisie Fus  Register   On: 10/22/2015 07:13   Dg Chest Port 1 View  10/14/2015  CLINICAL DATA:  Rest air distress for 1 hour. EXAM: PORTABLE CHEST 1 VIEW COMPARISON:  Chest x-ray from earlier same day and chest x-ray dated 10/13/2015. FINDINGS: Marked cardiomegaly is stable. Central pulmonary vascular congestion is stable. Vague opacities at each lung base, most likely small pleural effusions and/or atelectasis. No new lung findings seen. Tubes, lines and support apparatus are stable in the short-term interval. IMPRESSION: No interval change compared to the chest x-ray from earlier same day. Marked cardiomegaly is stable. Central pulmonary vascular congestion is stable. Probable small bilateral pleural effusions and/or atelectasis are stable. Electronically Signed   By: Bary Richard M.D.   On: 10/14/2015 17:43   Dg Chest Port 1 View  10/14/2015  CLINICAL DATA:  LVAD EXAM: PORTABLE CHEST 1 VIEW COMPARISON:  10/13/2015 FINDINGS: Interval extubation. Right central  line, left AICD, left chest tube and LVAD remain in place, unchanged. Cardiomegaly with vascular congestion. Bibasilar airspace opacities are noted, left greater than right. No real change since prior study.  No significant effusions. IMPRESSION: Interval extubation. Continued bibasilar atelectasis or infiltrates, not significantly changed. Cardiomegaly, vascular congestion. Electronically Signed   By: Charlett Nose M.D.   On: 10/14/2015 08:16     Medications:     Scheduled Medications: . anidulafungin  100 mg Intravenous Q24H  . antiseptic oral rinse  7 mL Mouth Rinse q12n4p  . aspirin  81 mg Oral Daily  . ceFEPime (MAXIPIME) IV  2 g Intravenous Q12H  . chlorhexidine  15 mL Mouth Rinse BID  . feeding supplement  237 mL Oral TID WC  . insulin aspart  0-9 Units Subcutaneous 6 times per day  . insulin glargine  10 Units Subcutaneous QHS  . levalbuterol  1.25 mg Nebulization Q6H  . multivitamin  1 tablet Oral QHS  . pantoprazole sodium  40 mg Per Tube Daily  . sildenafil  40 mg Oral TID  . sodium chloride  10-40 mL Intracatheter Q12H  . Warfarin - Pharmacist Dosing Inpatient   Does not apply q1800    Infusions: . sodium chloride Stopped (09/25/15 1100)  . sodium chloride 250 mL (November 05, 2015 1000)  . epinephrine 8 mcg/min (11/05/2015 1000)  . feeding supplement (OSMOLITE 1.5 CAL) Stopped (11/05/2015 0400)  . lactated ringers Stopped (09/26/15 0000)  . lactated ringers Stopped (09/24/15 0700)  . milrinone 0.4 mcg/kg/min (2015/11/05 1000)  . norepinephrine (LEVOPHED) Adult infusion 9 mcg/min (11/05/15 1124)    PRN Medications: sodium chloride, Place/Maintain arterial line **AND** sodium chloride, acetaminophen, albumin human, fentaNYL (SUBLIMAZE) injection, Gerhardt's butt cream, heparin, hydrALAZINE, ondansetron (ZOFRAN) IV, oxyCODONE, sodium chloride, sodium chloride   Assessment/Plan   1. Acute on chronic systolic HF -> Cardiogenic shock: NICM with EF 5%, mild to moderate RV hypokinesis.   S/p St Jude CRT-D. s/p HM II LVAD placement 09/20/2015. 2. Acute respiratory failure 3. Septic shock: Now with fungemia and stenotrophamonas in sputum 4. Large pericardial effusion 5. Acute on chronic renal failure, stage V - On CVVHD currently.  6. Atrial fibrillation: Paroxysmal.    - NSR currently. On coumadin and po amiodarone. INR 1.87. Pharmacy dosing. 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 Urology consulted before LVAD with foley placed. Foley was removed 10/05/15. Urology placed foley 1/11.   8. Acute blood loss Anemia 9. L Pleural Effusion  S/P chest tube.  He continues to deteriorate with multisystem organ failure. Now with large presumably hemorrhagic pericardial effusion. I have reviewed with interventional team and we all feel that effusion is not accessible percutaneously and would require surgical drainage. I had long discussion with his wife about this and she feels that given his overall downward course that he would not want to continue with aggressive measures at this point and they would like to switch to comfort care. I told her that this was not unreasonable as given his multi-system organ failure the chance for meaningful recovery was < 10%. I discussed with Dr. Donata Clay by phone (he was in the OR) and he agreed with the plan. Will deactivate LVAD tomorrow am. In the interval we will not escalate care. Will give 500cc NS now to support RV.  I reviewed the LVAD parameters from today, and compared the results to the patient's prior recorded data.  No programming changes were made.  The LVAD is functioning within specified parameters. LVAD interrogation was negative for any significant power changes, alarms or PI events/speed drops.  LVAD equipment check completed and is in good working order.  Back-up equipment present.   The patient is critically  ill with multiple organ systems failure and requires high complexity decision making for assessment and support, frequent  evaluation and titration of therapies, application of advanced monitoring technologies and extensive interpretation of multiple databases.   Critical Care Time devoted to patient care services described in this note is 60 Minutes.    Length of Stay: 29  Arvilla Meres MD 10/20/2015, 11:51 AM   Advanced Heart Failure Team Pager [phone] (M-F; 7a - 4p)  Please contact [facility] for night-coverage after hours (4p -7a ) and weekends on [email or web address]  VAD Team --- VAD ISSUES ONLY--- Pager [phone] (7am - 7am)

## 2015-10-15 NOTE — Progress Notes (Signed)
CSW met with wife at bedside to offer support. Patient's wife met with Dr Haroldine Laws and discussed current decline and recent events. Wife states "he would not want to have all this if there is not much hope". Wife states "just want to keep him comfortable". Wife very tearful as she spoke of her son and that he is very close to patient. Wife states son has anxiety and she has been in touch via phone and "breaking it to him gently". Wife states she contacted her sister as well as her sister in law to inform of comfort care decision and deactivate LVAD tomorrow per her discussion with Dr. Haroldine Laws. Wife overwhelmed by current situation but states she feels the "right decision has been made". CSW will continue to follow for supportive intervention as needed. Raquel Sarna, [place]

## 2015-10-15 NOTE — Progress Notes (Signed)
Dr. Donata Clay called regarding low flows and PI's.  Orders received. I will cont. To monitor.

## 2015-10-15 NOTE — Progress Notes (Signed)
[facility]   Reason for visit: Follow up on fungemia, pneumonia  Interval History: on bipap overnight.   No new positive cultures.  Candida sensitivities still pending. Afebrile, WBC improved.  Wife at bedside. Cultures reviewed and no new positive cultures.   Physical Exam: Constitutional: sleeping on bipap Filed Vitals:   10-24-15 0800 10-24-2015 0900  BP: 126/90   Pulse:    Temp:  98.5 F (36.9 C)  Resp: 26    Eyes: anicteric HENT: no thrush Respiratory: on bipap Cardiovascular: LVAD GI: soft, nt, nd Ext: no edema in le Skin: no rash  Review of Systems: Gastrointestinal: some loose stool, on tube feeds Patient otherwise does not respond  Lab Results  Component Value Date   WBC 15.9* 10-24-15   HGB 8.9* 2015/10/24   HCT 26.2* Oct 24, 2015   MCV 86.5 10-24-2015   PLT 701* 2015-10-24    Lab Results  Component Value Date   CREATININE 1.72* 2015/10/24   CREATININE 1.72* 10/24/2015   BUN 33* 10/24/15   BUN 32* October 24, 2015   NA 139 10/24/2015   NA 139 10/24/2015   K 4.4 October 24, 2015   K 4.4 10/24/2015   CL 106 10/24/15   CL 106 10-24-15   CO2 24 2015/10/24   CO2 24 10/24/2015    Lab Results  Component Value Date   ALT 49 24-Oct-2015   AST 164* 2015-10-24   ALKPHOS 171* Oct 24, 2015     Microbiology: Recent Results (from the past 240 hour(s))  Culture, blood (single)     Status: None   Collection Time: 10/05/15 10:32 AM  Result Value Ref Range Status   Specimen Description BLOOD CENTRAL LINE  Final   Special Requests BOTTLES DRAWN AEROBIC AND ANAEROBIC 5CC  Final   Culture NO GROWTH 5 DAYS  Final   Report Status 10/10/2015 FINAL  Final  Culture, Urine     Status: None   Collection Time: 10/06/15  4:30 PM  Result Value Ref Range Status   Specimen Description URINE, CLEAN CATCH  Final   Special Requests Levaquin, anidulafungin  Final   Culture NO GROWTH 1 DAY  Final   Report Status 10/07/2015 FINAL  Final  Urine culture      Status: None   Collection Time: 10/07/15  4:25 PM  Result Value Ref Range Status   Specimen Description URINE, CATHETERIZED  Final   Special Requests NONE  Final   Culture NO GROWTH 1 DAY  Final   Report Status 10/08/2015 FINAL  Final  Body fluid culture     Status: None   Collection Time: 10/08/15  9:20 AM  Result Value Ref Range Status   Specimen Description FLUID LEFT PLEURAL  Final   Special Requests NONE  Final   Gram Stain   Final    ABUNDANT WBC PRESENT,BOTH PMN AND MONONUCLEAR NO ORGANISMS SEEN    Culture NO GROWTH 3 DAYS  Final   Report Status 10/11/2015 FINAL  Final  Culture, blood (routine x 2)     Status: None   Collection Time: 10/08/15 10:20 AM  Result Value Ref Range Status   Specimen Description BLOOD RIGHT HAND  Final   Special Requests BOTTLES DRAWN AEROBIC AND ANAEROBIC 10CC  Final   Culture NO GROWTH 5 DAYS  Final   Report Status 10/13/2015 FINAL  Final  Culture, blood (routine x 2)     Status: None   Collection Time: 10/08/15 10:31 AM  Result Value Ref Range Status  Specimen Description BLOOD LEFT HAND  Final   Special Requests BOTTLES DRAWN AEROBIC AND ANAEROBIC 10CC  Final   Culture NO GROWTH 5 DAYS  Final   Report Status 10/13/2015 FINAL  Final  Culture, Urine     Status: None   Collection Time: 10/08/15 12:10 PM  Result Value Ref Range Status   Specimen Description URINE, CATHETERIZED  Final   Special Requests Immunocompromised  Final   Culture NO GROWTH 1 DAY  Final   Report Status 10/04/2015 FINAL  Final  Culture, respiratory (NON-Expectorated)     Status: None   Collection Time: 10/10/15  5:43 AM  Result Value Ref Range Status   Specimen Description TRACHEAL ASPIRATE  Final   Special Requests Normal  Final   Gram Stain   Final    MODERATE WBC PRESENT, PREDOMINANTLY PMN RARE SQUAMOUS EPITHELIAL CELLS PRESENT NO ORGANISMS SEEN Performed at [facility]    Culture   Final    NORMAL OROPHARYNGEAL FLORA Performed at [facility]    Report Status 10/13/2015 FINAL  Final    Impression/Plan:  1. Fungemia - repeat cultures remain negative.  Sensitivities pending so will continue with anidulafungin.  2.  Fever - last fever 1/13.  No new positive cultures.  WBC improved.  There does not appear to be any new infection concern at this time. 3.  Possible pneumonia - on cefepime and has continue.  Will continue for now.

## 2015-10-15 NOTE — Progress Notes (Signed)
RT note- patients spontaneous respiratory; effort has declined, being made comfortable, alarms adjusted to reflect comfort care.

## 2015-10-15 NOTE — Progress Notes (Signed)
CT surgery HeartMate 2 Rounding Note POD #21  Heartmate 2 implantation   Subjective:   Class 4 CHF with cardiogenic shock, hx nonischemic    Cardiomyopathy Preop IABP Hx BPH, TURP and bladder outlet obstruction required cystoscopy to place foley\ probable UTI preop Preop severe protein loss malnutrition, prealbumin < 8 Pre and Post-op acute on chronic renal failure- required CVVH for postop acute HTN Preop guaiac + stool Preop chronic Eliquis for a-fib Postop acute on chronic anemia Postop fungemia Postop L pleural effusion requiring chest tube x2-  Postop acute respiratory failure requiring intubation   for apnea and possible mucous plugs   Patient with clinical deterioration starting postop day 12 , blood cultures positive for candida species. Pro calcitonin marker positive with clinical signs of sepsis. Antibiotic coverage has been increased and managed by ID. Patient has been replaced on pressors . Cultures have been negative since isolated candida species positive blood culture and patient was placed on antifungal drug Repeat left chest tube 28 French placed  with drainage 1 L of serosanguineous pleural effusion-cultures negative so far. Postop acute renal failure--renal consult placed-patient on CVVH via right IJ temporary dialysis catheter . HD has been effective in normalizing creatinine  Events noted- results of echocardiogram showing large pericardial effusion explains decrease in pump performance Family wishes to provide comfort care, no further surgery- pericardial window -  and termination of pump in near future - which is understandable.       LVAD INTERROGATION:  HeartMate II LVAD:  Flow 3.3 liters/min, speed 8800 rpm, power 5.2, PI 2.2  Controller intact  Objective:    Vital Signs:   Temp:  [96.7 F (35.9 C)-98.5 F (36.9 C)] 97.5 F (36.4 C) (01/19 1624) Pulse Rate:  [29-122] 92 (01/19 1700) Resp:  [16-37] 17 (01/19 1700) BP: (85-126)/(76-95) 85/76 mmHg  (01/19 1600) SpO2:  [86 %-100 %] 97 % (01/19 1700) Arterial Line BP: (41-135)/(34-97) 82/73 mmHg (01/19 1700) FiO2 (%):  [40 %-100 %] 40 % (01/19 1624) Weight:  [130 lb 8.2 oz (59.2 kg)] 130 lb 8.2 oz (59.2 kg) (01/19 0500) Last BM Date: 10/14/15 Mean arterial Pressure 90-95  Intake/Output:   Intake/Output Summary (Last 24 hours) at 10/07/2015 1715 Last data filed at 10/10/2015 1600  Gross per 24 hour  Intake 3147.75 ml  Output    684 ml  Net 2463.75 ml     Physical Exam: General:  Appears weak and fragile. Currently ion BIPAP sedated HEENT: normal Neck: supple. no JVP  No carotid pulses; no bruits. No lymphadenopathy or thryomegaly appreciated. Cor: Mechanical heart sounds with LVAD hum present. Lungs: clear. Improved breath sounds after placement of left chest tube. Left chest tube drainage now minimal Abdomen: soft, nontender, nondistended. No hepatosplenomegaly. No bruits or masses. Good bowel sounds. Extremities: no cyanosis, clubbing, rash, mild- mod extremity  edema Neuro sedated with minimal responsiveness- but appears comfortable             GTelemetry: paced rhythm 90  Labs: Basic Metabolic Panel:  Recent Labs Lab 10/11/15 0426  10/12/15 0410  10/13/15 0510 10/13/15 1606 10/14/15 0420 10/14/15 1550 10/07/2015 0326 10/21/2015 0332 10/15/15 1500  NA 137  < > 138  < > 139 140 140  140 137 139 139  139 139  K 4.7  < > 3.6  < > 3.6 4.2 4.1  4.0 4.1 4.4 4.4  4.4 4.7  CL 104  < > 93*  < > 97* 102 102  102 101 102 106  106 104  CO2 26  < > 36*  < > 33* 29 27  29 24   --  24  24 23   GLUCOSE 158*  < > 180*  < > 78 121* 146*  146* 167* 164* 166*  167* 140*  BUN 28*  < > 19  < > 21* 22* 24*  24* 25* 33* 32*  33* 39*  CREATININE 1.36*  < > 1.09  < > 1.19 1.20 1.17  1.20 1.28* 1.50* 1.72*  1.72* 2.15*  CALCIUM 8.9  < > 10.1  < > 9.3 9.1 9.6  9.6 9.4  --  9.4  9.5 10.0  MG 2.2  --  2.1  --  2.1  --  2.2  --   --  2.3  --   PHOS 2.0*  < >  --   < > 1.3* 3.4 2.0*   2.0* 3.7  --  3.3  3.2  --   < > = values in this interval not displayed.  Liver Function Tests:  Recent Labs Lab 10/11/15 0426  10/12/15 0410  10/13/15 0510 10/13/15 1606 10/14/15 0420 10/14/15 1550 10/19/2015 0332  AST 70*  --  79*  --  154*  --  127*  --  164*  ALT 25  --  28  --  45  --  42  --  49  ALKPHOS 127*  --  138*  --  136*  --  136*  --  171*  BILITOT 1.0  --  0.9  --  0.7  --  1.2  --  2.0*  PROT 6.1*  --  5.7*  --  6.0*  --  6.3*  --  6.4*  ALBUMIN 3.1*  < > 3.2*  < > 3.7 3.5 3.8  3.8 3.9 4.2  4.1  < > = values in this interval not displayed. No results for input(s): LIPASE, AMYLASE in the last 168 hours. No results for input(s): AMMONIA in the last 168 hours.  CBC:  Recent Labs Lab 10/13/15 0510 10/14/15 0420 10/14/15 1545 10/18/2015 0326 10/25/2015 0332 10/17/2015 1500  WBC 13.2* 15.8* 17.8*  --  15.9* 22.2*  HGB 8.3* 9.2* 7.9* 10.2* 8.9* 9.0*  HCT 25.6* 27.2* 23.9* 30.0* 26.2* 26.5*  MCV 88.3 88.0 87.5  --  86.5 88.0  PLT 979* 865* 869*  --  701* 748*    INR:  Recent Labs Lab 10/11/15 0426 10/12/15 0410 10/13/15 0510 10/14/15 0420 09/29/2015 0332  INR 2.68* 2.21* 2.05* 1.87* 2.26*    Other results:  EKG:   Imaging: Dg Chest Port 1 View  10/25/2015  CLINICAL DATA:  Shortness of breath. EXAM: PORTABLE CHEST 1 VIEW COMPARISON:  10/14/2015. FINDINGS: Right IJ line, feeding tube, left chest tube in stable position. AICD and left ventricular assist device in stable position. Severe cardiomegaly. Persistent bilateral pulmonary mild interstitial prominence noted consistent with pulmonary edema. Low lung volumes. Small pleural effusions. No pneumothorax . IMPRESSION: 1. Lines and tubes including left chest tube in stable position. No pneumothorax. 2. AICD and left ventricular assist device in stable position. Severe cardiomegaly with mild bilateral pulmonary interstitial prominence and small pleural effusions again noted without interim change. Findings  consistent with congestive heart failure. 3. Low lung volumes. Electronically Signed   By: Maisie Fus  Register   On: 10/11/2015 07:13   Dg Chest Port 1 View  10/14/2015  CLINICAL DATA:  Rest air distress for 1 hour. EXAM: PORTABLE CHEST 1 VIEW COMPARISON:  Chest x-ray  from earlier same day and chest x-ray dated 10/13/2015. FINDINGS: Marked cardiomegaly is stable. Central pulmonary vascular congestion is stable. Vague opacities at each lung base, most likely small pleural effusions and/or atelectasis. No new lung findings seen. Tubes, lines and support apparatus are stable in the short-term interval. IMPRESSION: No interval change compared to the chest x-ray from earlier same day. Marked cardiomegaly is stable. Central pulmonary vascular congestion is stable. Probable small bilateral pleural effusions and/or atelectasis are stable. Electronically Signed   By: Bary Richard M.D.   On: 10/14/2015 17:43   Dg Chest Port 1 View  10/14/2015  CLINICAL DATA:  LVAD EXAM: PORTABLE CHEST 1 VIEW COMPARISON:  10/13/2015 FINDINGS: Interval extubation. Right central line, left AICD, left chest tube and LVAD remain in place, unchanged. Cardiomegaly with vascular congestion. Bibasilar airspace opacities are noted, left greater than right. No real change since prior study. No significant effusions. IMPRESSION: Interval extubation. Continued bibasilar atelectasis or infiltrates, not significantly changed. Cardiomegaly, vascular congestion. Electronically Signed   By: Charlett Nose M.D.   On: 10/14/2015 08:16     Medications:     Scheduled Medications: . anidulafungin  100 mg Intravenous Q24H  . antiseptic oral rinse  7 mL Mouth Rinse q12n4p  . aspirin  81 mg Oral Daily  . ceFEPime (MAXIPIME) IV  2 g Intravenous Q12H  . chlorhexidine  15 mL Mouth Rinse BID  . feeding supplement  237 mL Oral TID WC  . insulin aspart  0-9 Units Subcutaneous 6 times per day  . insulin glargine  10 Units Subcutaneous QHS  . levalbuterol   1.25 mg Nebulization Q6H  . multivitamin  1 tablet Oral QHS  . pantoprazole sodium  40 mg Per Tube Daily  . sildenafil  40 mg Oral TID  . sodium chloride  10-40 mL Intracatheter Q12H    Infusions: . sodium chloride Stopped (09/25/15 1100)  . sodium chloride 250 mL (09/29/2015 1600)  . epinephrine 8 mcg/min (09/28/2015 1600)  . feeding supplement (OSMOLITE 1.5 CAL) Stopped (10/17/2015 0400)  . lactated ringers Stopped (09/26/15 0000)  . lactated ringers Stopped (09/24/15 0700)  . milrinone 0.4 mcg/kg/min (10/11/2015 1600)  . norepinephrine (LEVOPHED) Adult infusion 8.96 mcg/min (10/13/2015 1600)    PRN Medications: sodium chloride, Place/Maintain arterial line **AND** sodium chloride, acetaminophen, albumin human, fentaNYL (SUBLIMAZE) injection, Gerhardt's butt cream, heparin, hydrALAZINE, ondansetron (ZOFRAN) IV, oxyCODONE, sodium chloride, sodium chloride   Assessment:  VAD flow parameters are depressed due to large posterior pericardial effusion - decision made not to drain has been made  Goals of care are now comfort measures  and family support  Plan/Discussion:      I reviewed the LVAD parameters from today, and compared the results to the patient's prior recorded data.  No programming changes were made.  The LVAD is functioning within specified parameters.  The patient performs LVAD self-test daily.  LVAD interrogation was negative for any significant power changes, alarms or PI events/speed drops.  LVAD equipment check completed and is in good working order.  Back-up equipment present.   LVAD education done on emergency procedures and precautions and reviewed exit site care.  Length of Stay: [address]  Kathlee Nations Kake III 10/01/2015, 5:15 PM

## 2015-10-16 MED ORDER — MIDAZOLAM HCL 5 MG/ML IJ SOLN
0.0000 mg/h | INTRAMUSCULAR | Status: DC
  Filled 2015-10-16: qty 10

## 2015-10-16 MED ORDER — FENTANYL CITRATE (PF) 2500 MCG/50ML IJ SOLN
0.0000 ug/h | INTRAMUSCULAR | Status: DC
  Filled 2015-10-16: qty 50

## 2015-10-17 LAB — CULTURE, BLOOD (SINGLE)

## 2015-10-19 LAB — FUNGAL SUSCEPTIBILITY

## 2015-10-28 NOTE — Discharge Summary (Signed)
Brad [name], Brad [name] NO.:  [id number]  MEDICAL RECORD NO.:  [id number]  LOCATION:  2S10C                        FACILITY:  [facility]  PHYSICIAN:  Kerin Perna, M.D.  DATE OF BIRTH:  Nov 16, 1940  DATE OF ADMISSION:  09/13/2015 DATE OF DISCHARGE:  10/20/2015                              DISCHARGE SUMMARY   DEATH SUMMARY  ADMISSION DIAGNOSES: 1. 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 2. Cardiogenic shock from cardiomyopathy, requiring pressors and intra-     aortic balloon pump. 3. Chronic renal failure. 4. Chronic anemia. 5. Protein malnutrition. 6. Chronic anemia.  OPERATIONS AND PROCEDURES: 1. Placement of Swan catheter and intra-aortic balloon pump following     admission by Dr. Gala Romney. 2. Right heart catheterization by Dr. Gala Romney, September 18, 2015. 3. Insertion of implantable left ventricular HeartMate II assist     device, September 23, 2015, by Dr. Donata Clay. 4. Placement of left chest tube for postoperative pleural effusion by     Dr. Donata Clay, postop day 8. 5. Placement of temporary hemodialysis catheter by Dr. Donata Clay for     postoperative acute renal failure.  HOSPITAL COURSE:  The patient is a very nice 75 year old African American male with end-stage heart failure from nonischemic cardiomyopathy, was transferred to this hospital for further advanced care and for evaluation of advanced therapies with a LVAD.  The patient was in cardiogenic shock and required pressors and a balloon pump.  The mechanical cardiac support team gave the patient a full evaluation and found him to be an acceptable, but high risk candidate for placement of an implantable left ventricular assist device for destination therapy to improve his survival and improved quality of life.  After full evaluation and obtaining informed consent, the patient underwent the procedure on September 23, 2015.  The operation went well with  a cardiopulmonary bypass pump time of 1 hour and he left the operating room with good RV function and excellent flows from the LVAD and stable vital signs.  Over the next several days, he progressed.  He was extubated.  He had his drips weaned and he had his drainage tubes removed.  His renal function remained stable.  The patient was on preoperative antibiotics because of probable pulmonary congestion and these were continued.  On postoperative day 12 after the patient had been out of bed and ambulating 150 feet with all incisions healing well, he developed fever and white count increased.  He was found to have positive blood cultures for Candida species.  He was treated with the appropriate antibiotics and followed by the Infectious Disease team.  From after the fungal septicemia, he developed acute renal failure and had a dialysis catheter and was placed on renal replacement therapy with CVVH.  The patient had a subsequent apneic spell, for which, he was intubated for approximately 3-4 days and then extubated.  His blood culture was cleared of Candida species; however, he continued to have renal failure after the CVVH was discontinued.  On September 14, 2015, an echocardiogram was performed to evaluate RV function.  His initial echocardiogram approximately 8 days after surgery showed good RV function and good placement of the LVAD  pump cannula.  However, his second echo showed diminished RV filling from a moderate-to-large pericardial effusion.  Because the patient was in renal failure without signs of reversing and he was still immobile out of bed due to his weakness, deconditioning and malnutrition, a request for subxiphoid pericardial window was denied by the patient's family, who felt he would not want to continue further intensive care support in this situation.  The patient was placed DNR, comfort measures were initiated.  Early morning of October 16, 2015, the patient  had deterioration in vital signs, LVAD pump stoppage, no signs of respiration, pulse or EKG activity and was declared dead.  The family was present and did not request a postmortem exam.  FINAL DIAGNOSES: 1. 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 2. Preoperative history of atrial fibrillation, on anticoagulation. 3. Chronic anemia, chronic renal insufficiency, chronic malnutrition. 4. Acute renal failure following LVAD implantation from sepsis. 5. Multiorgan failure from sepsis following implantation of HeartMate     II LVAD. 6. Postoperative pericardial effusion causing RV diastolic filling     restriction and subsequent death.     Kerin Perna, M.D.     PV/MEDQ  D:  10/04/2015  T:  10/03/2015  Job:  [id number]

## 2015-10-28 NOTE — Progress Notes (Signed)
LVAD alarming no flow. Aline flat with no reading, and pt not breathing.  Dr Gala Romney called  And instructed  Me to disconnect LVAD.  Pt's wife called and is coming to hospital.  Listened for any breath sounds or heart sounds and none auscultated.  Second RN [employer] also listened without hearing any breath or heart sounds either.  TOD 0106.

## 2015-10-28 DEATH — deceased

## 2016-09-04 IMAGING — CR DG CHEST 1V PORT
1 series · 1 of 1 positions shown · non-contrast
Comparison: 10/05/2015.

CLINICAL DATA: Left ventricular assist device.

EXAM:
PORTABLE CHEST 1 VIEW

[AP]
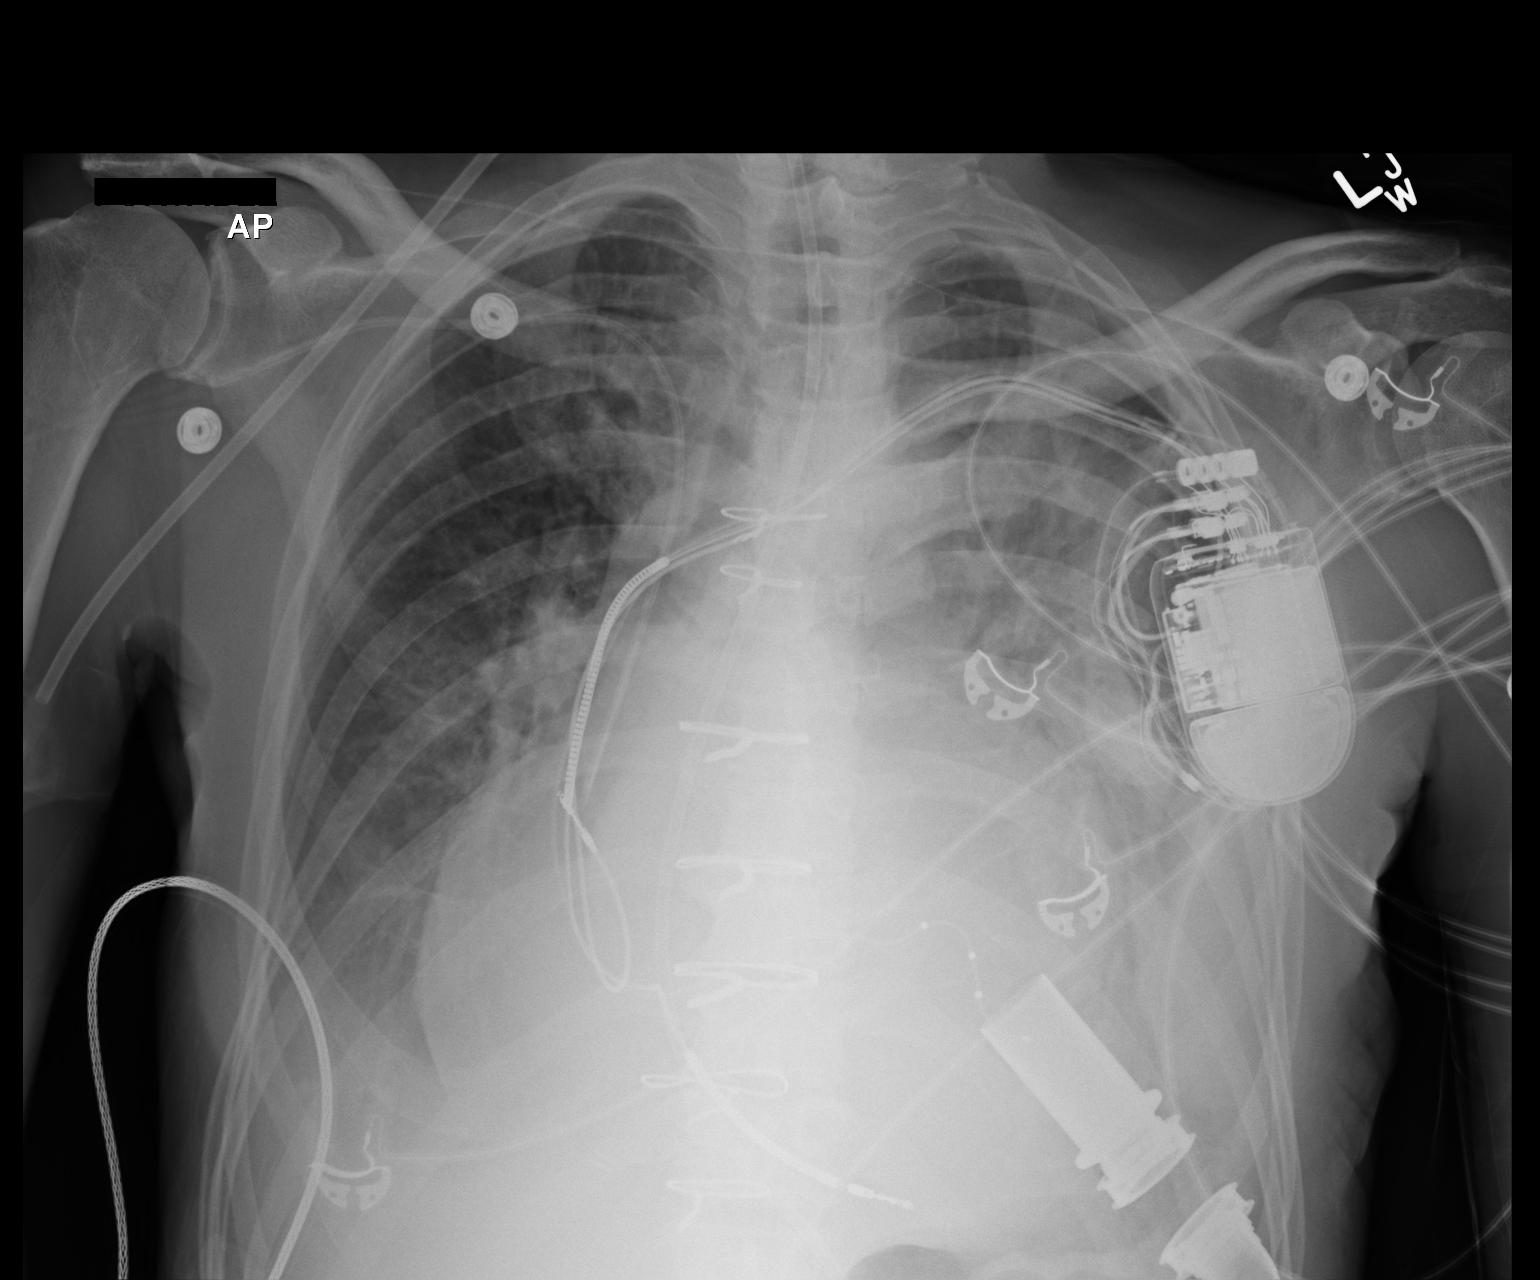

[1 of 1 positions shown; findings below may reference images not displayed]

FINDINGS: Right subclavian central line and feeding tube in stable position.
AICD in stable position. Left ventricular assist device noted.
Severe cardiomegaly. Progressive bilateral pulmonary infiltrates
suggesting pulmonary edema. Low lung volumes with bibasilar
atelectasis. Bilateral pleural effusions, most prominent on the
left. No pneumothorax.
IMPRESSION: 1. Lines and tubes in stable position.
2. AICD in stable position. Left ventricular assist device again
noted. Severe cardiomegaly with progressive bilateral pulmonary
alveolar infiltrates suggesting pulmonary edema. Bilateral pleural
effusions, most prominent on the left.
3. Low lung volumes with bibasilar atelectasis.

## 2016-09-06 IMAGING — CR DG CHEST 1V PORT
1 series · 1 of 1 positions shown · non-contrast
Comparison: Study obtained earlier in the day

CLINICAL DATA: Status post left-sided chest tube placement

EXAM:
PORTABLE CHEST 1 VIEW

[AP]
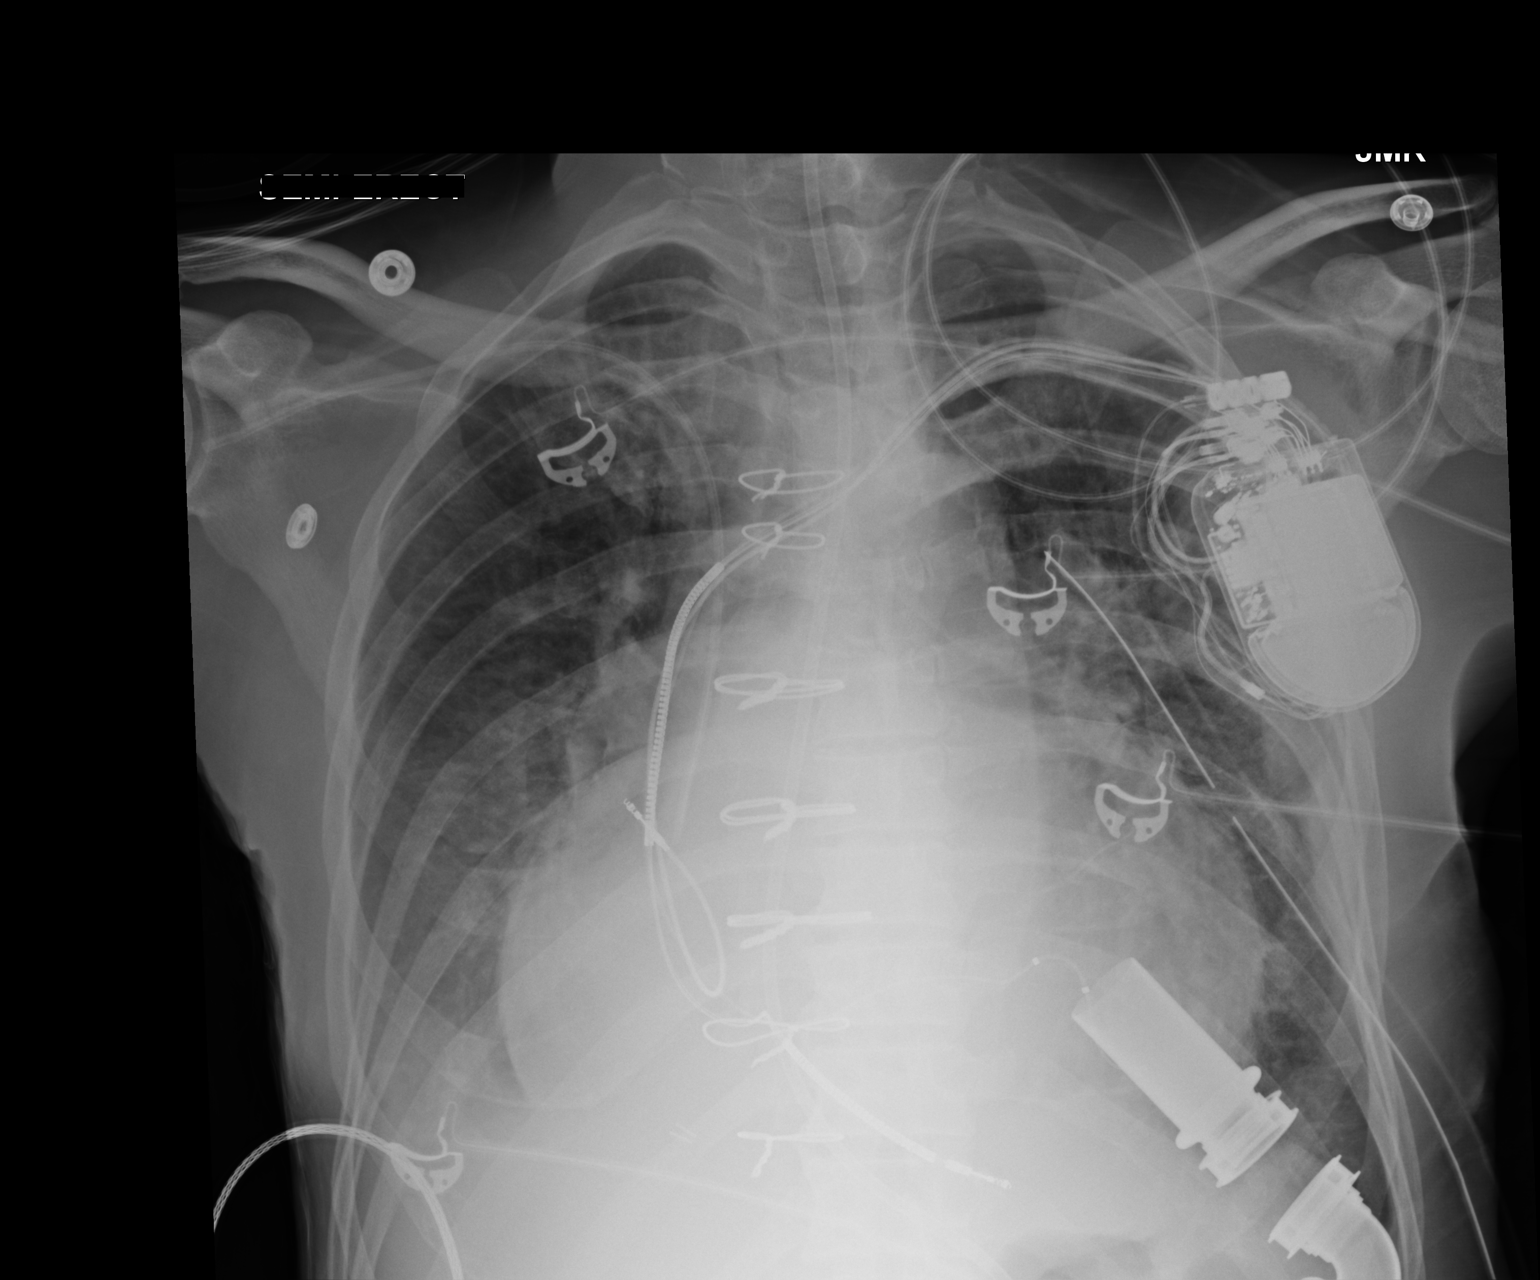

[1 of 1 positions shown; findings below may reference images not displayed]

FINDINGS: There is no a chest tube on the left. There has been a marked
diminution of pleural effusion on the left following chest tube
placement. On the minimal left-sided pleural effusion remains. No
pneumothorax is evident. There is a small right pleural effusion.
There is mild bibasilar atelectasis.

There is a left ventricular assist device. There are pacemaker leads
attached to the right and left heart, stable. Heart is enlarged with
pulmonary vascularity within normal limits. No adenopathy evident.
Feeding tube tip is below the diaphragm and not seen. Central
catheter tip is at the cavoatrial junction, stable.
IMPRESSION: Marked diminution of left-sided pleural effusion following chest
tube placement on the left. There is a minimal left effusion
currently with a small persistent right effusion, stable. No edema
or consolidation. Stable cardiac enlargement. Tube. And catheter
positions as described. No pneumothorax.

## 2016-09-07 IMAGING — CR DG CHEST 1V PORT
1 series · 1 of 1 positions shown · non-contrast
Comparison: 10/08/2015

CLINICAL DATA: INSERTION OF IMPLANTABLE LEFT VENTRICULAR ASSIST
DEVICE (N/A Chest)

TRANSESOPHAGEAL ECHOCARDIOGRAM (RTOYOTA) (N/A ) [id number] CPT (R)
EXAM:
PORTABLE CHEST 1 VIEW

[AP]
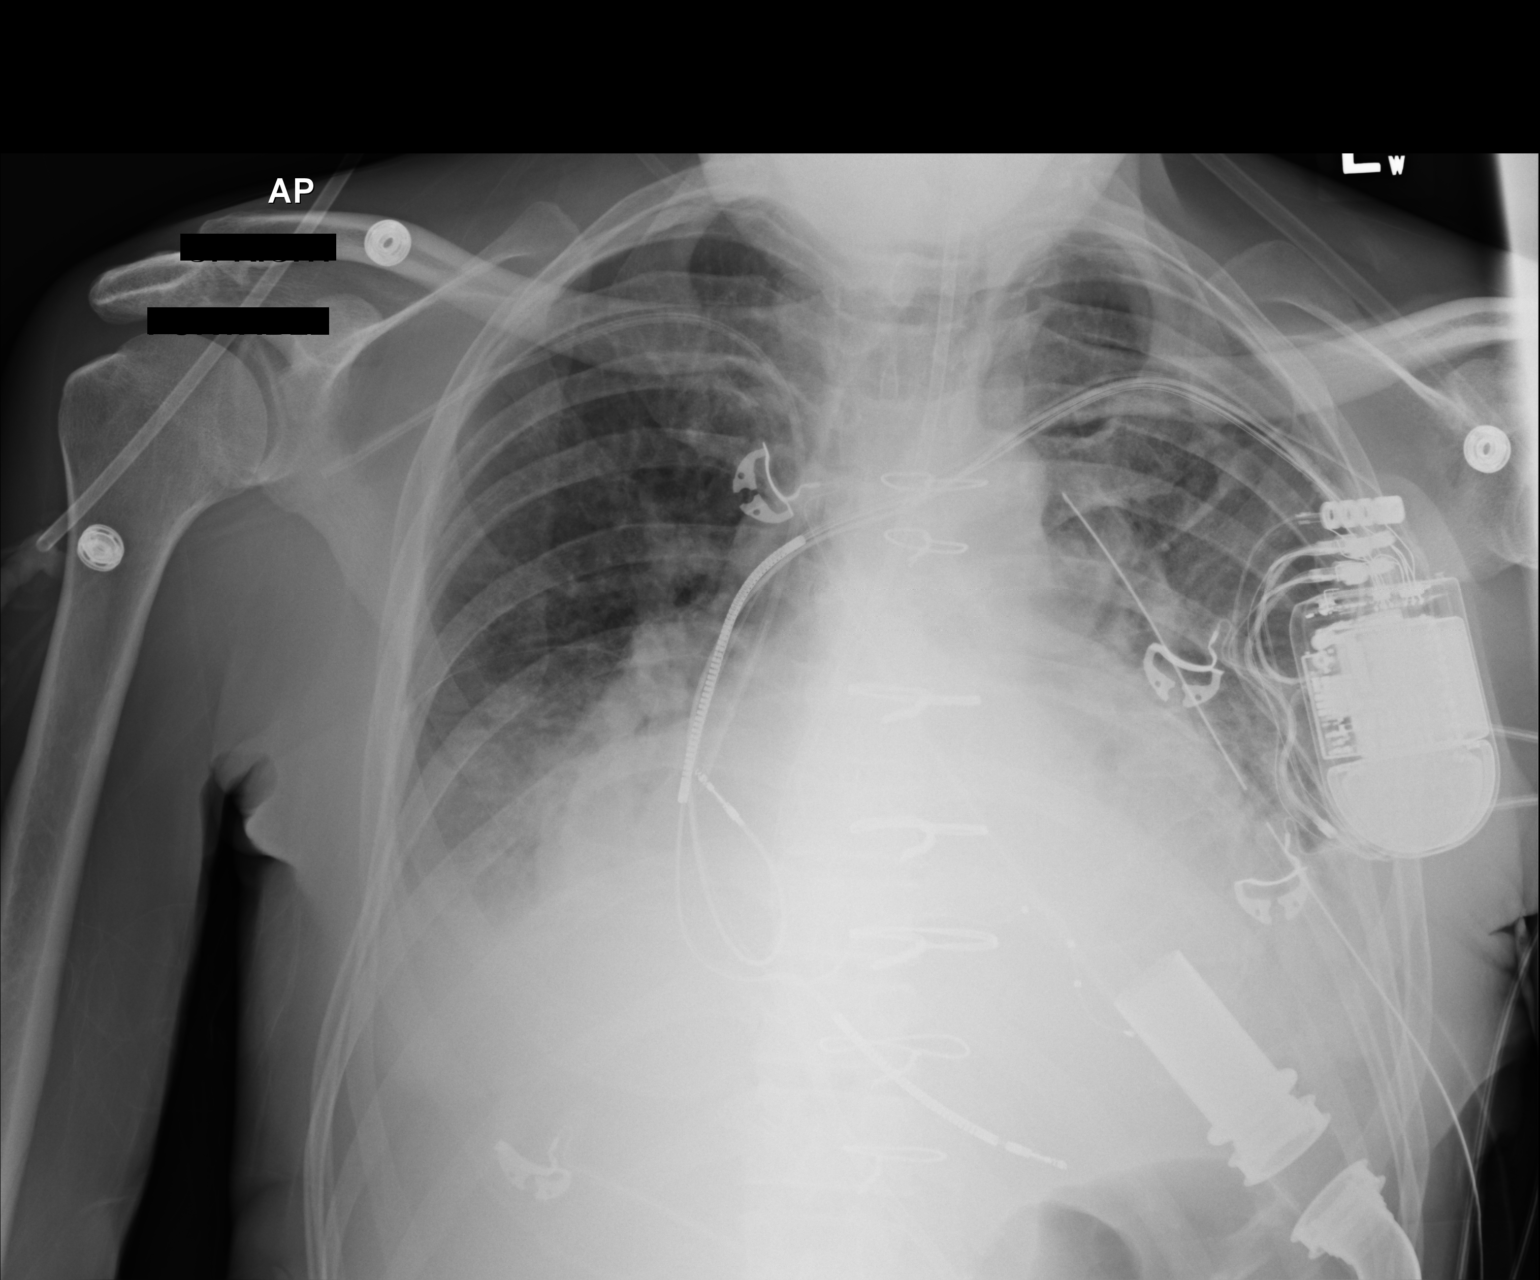

[1 of 1 positions shown; findings below may reference images not displayed]

FINDINGS: Cardiac silhouette remains enlarged. There is no mediastinal
widening. Visible portion of the ventricular assist device is stable
projecting over the left ventricle.

There is persistent opacity in the lung bases and left perihilar
region consistent with a combination of pleural effusions and
atelectasis. No overt pulmonary edema.

Right subclavian central venous line, left-sided chest tube, enteric
feeding tube and biventricular cardioverter-defibrillator are all
stable in well positioned.

No pneumothorax.
IMPRESSION: 1. Findings are similar to the previous day's study allowing for
differences in technique.
2. Persistent lower lung zone and left perihilar opacity consistent
with a combination of pleural effusions and atelectasis. No overt
pulmonary edema.
3. No pneumothorax.
4. Support apparatus is stable and well positioned.

## 2016-09-07 IMAGING — CR DG CHEST 1V PORT
1 series · 1 of 1 positions shown · non-contrast
Comparison: 10/09/2015 .

CLINICAL DATA: Dialysis.

EXAM:
PORTABLE CHEST 1 VIEW

[AP]
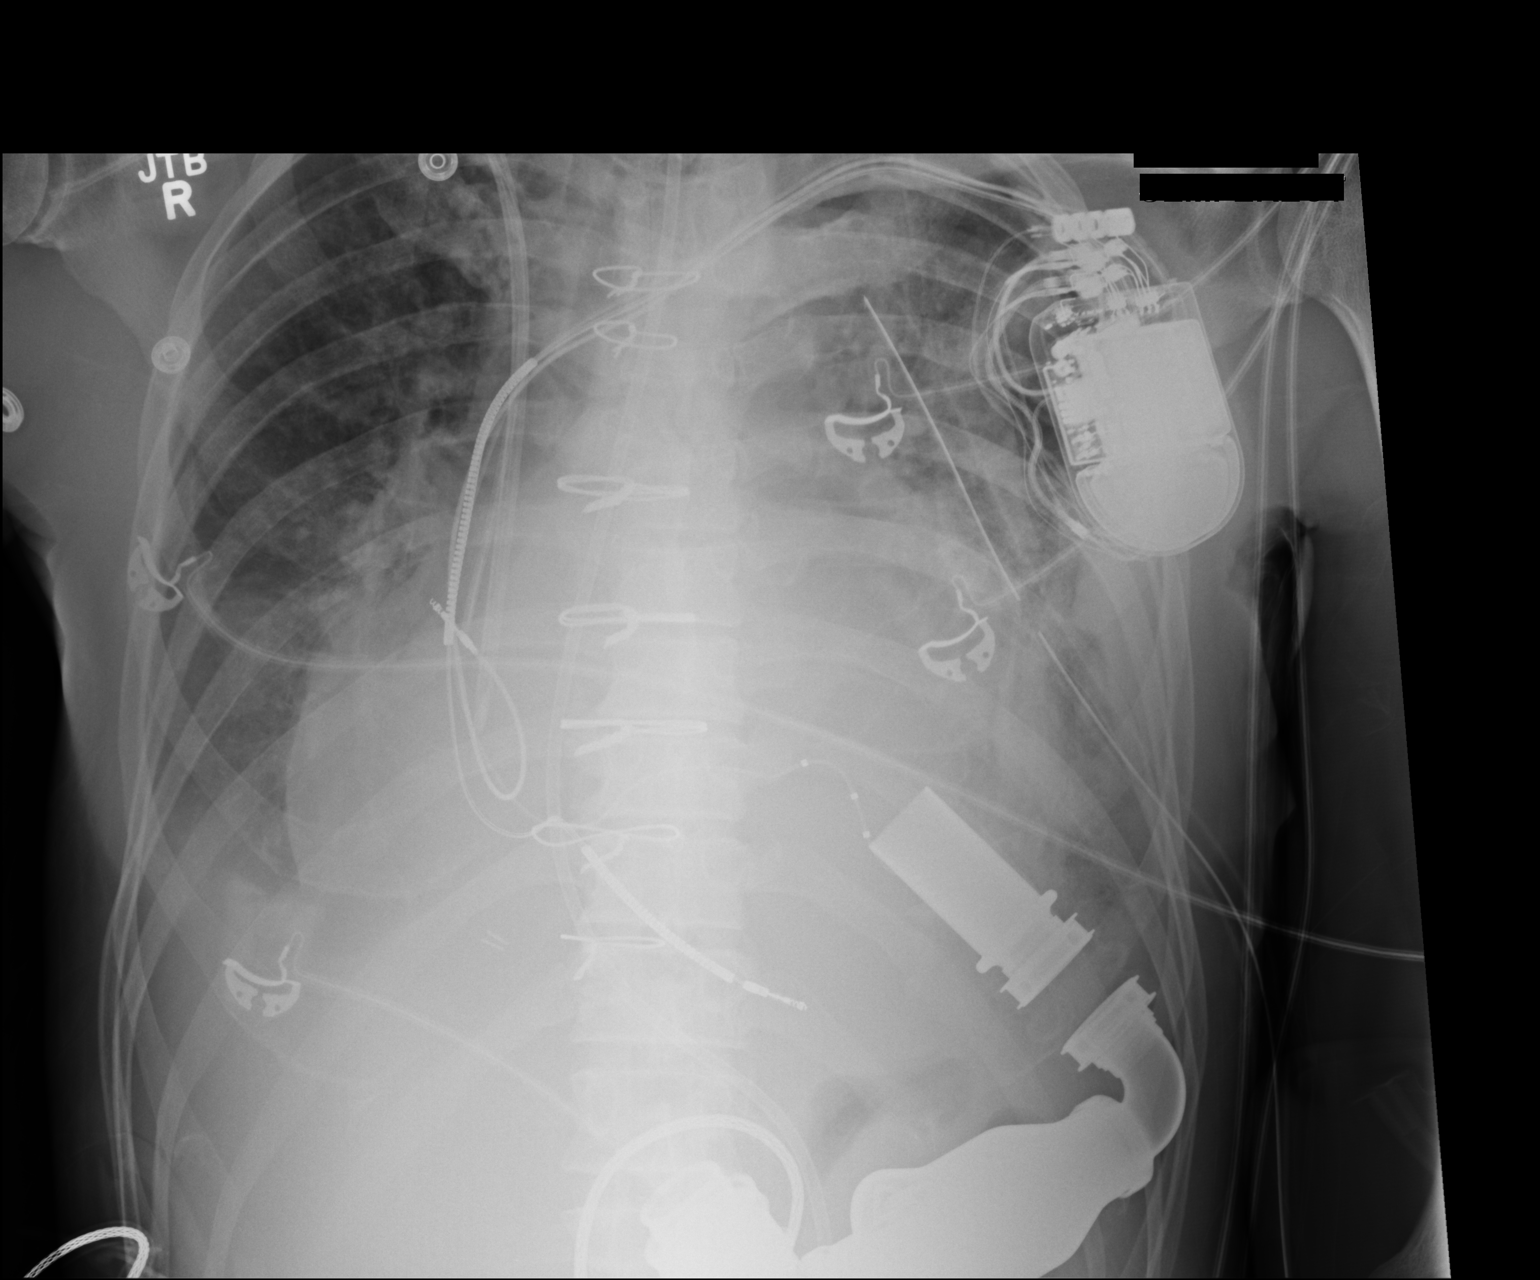

[1 of 1 positions shown; findings below may reference images not displayed]

FINDINGS: Interim placement dialysis catheter. Is tip is at the cavoatrial
junction. Right subclavian central line in stable position. Feeding
tube and left chest tube in stable position. AICD and left
ventricular assist device in stable position. Cardiomegaly with
diffuse bilateral pulmonary alveolar infiltrates and pleural
effusions consistent congestive heart failure .
IMPRESSION: 1. Interim placement dialysis catheter, its tip is at the cavoatrial
junction. Remaining lines and tubes in stable position. No
pneumothorax.
2. AICD and left ventricular assist device stable position.
Persistent severe cardiomegaly. Diffuse bilateral pulmonary alveolar
infiltrates and pleural effusions consistent with congestive heart
failure.

## 2016-09-10 IMAGING — CR DG CHEST 1V PORT
1 series · 1 of 1 positions shown · non-contrast
Comparison: 10/11/2015.

CLINICAL DATA: Left ventricular assist device.

EXAM:
PORTABLE CHEST 1 VIEW

[AP]
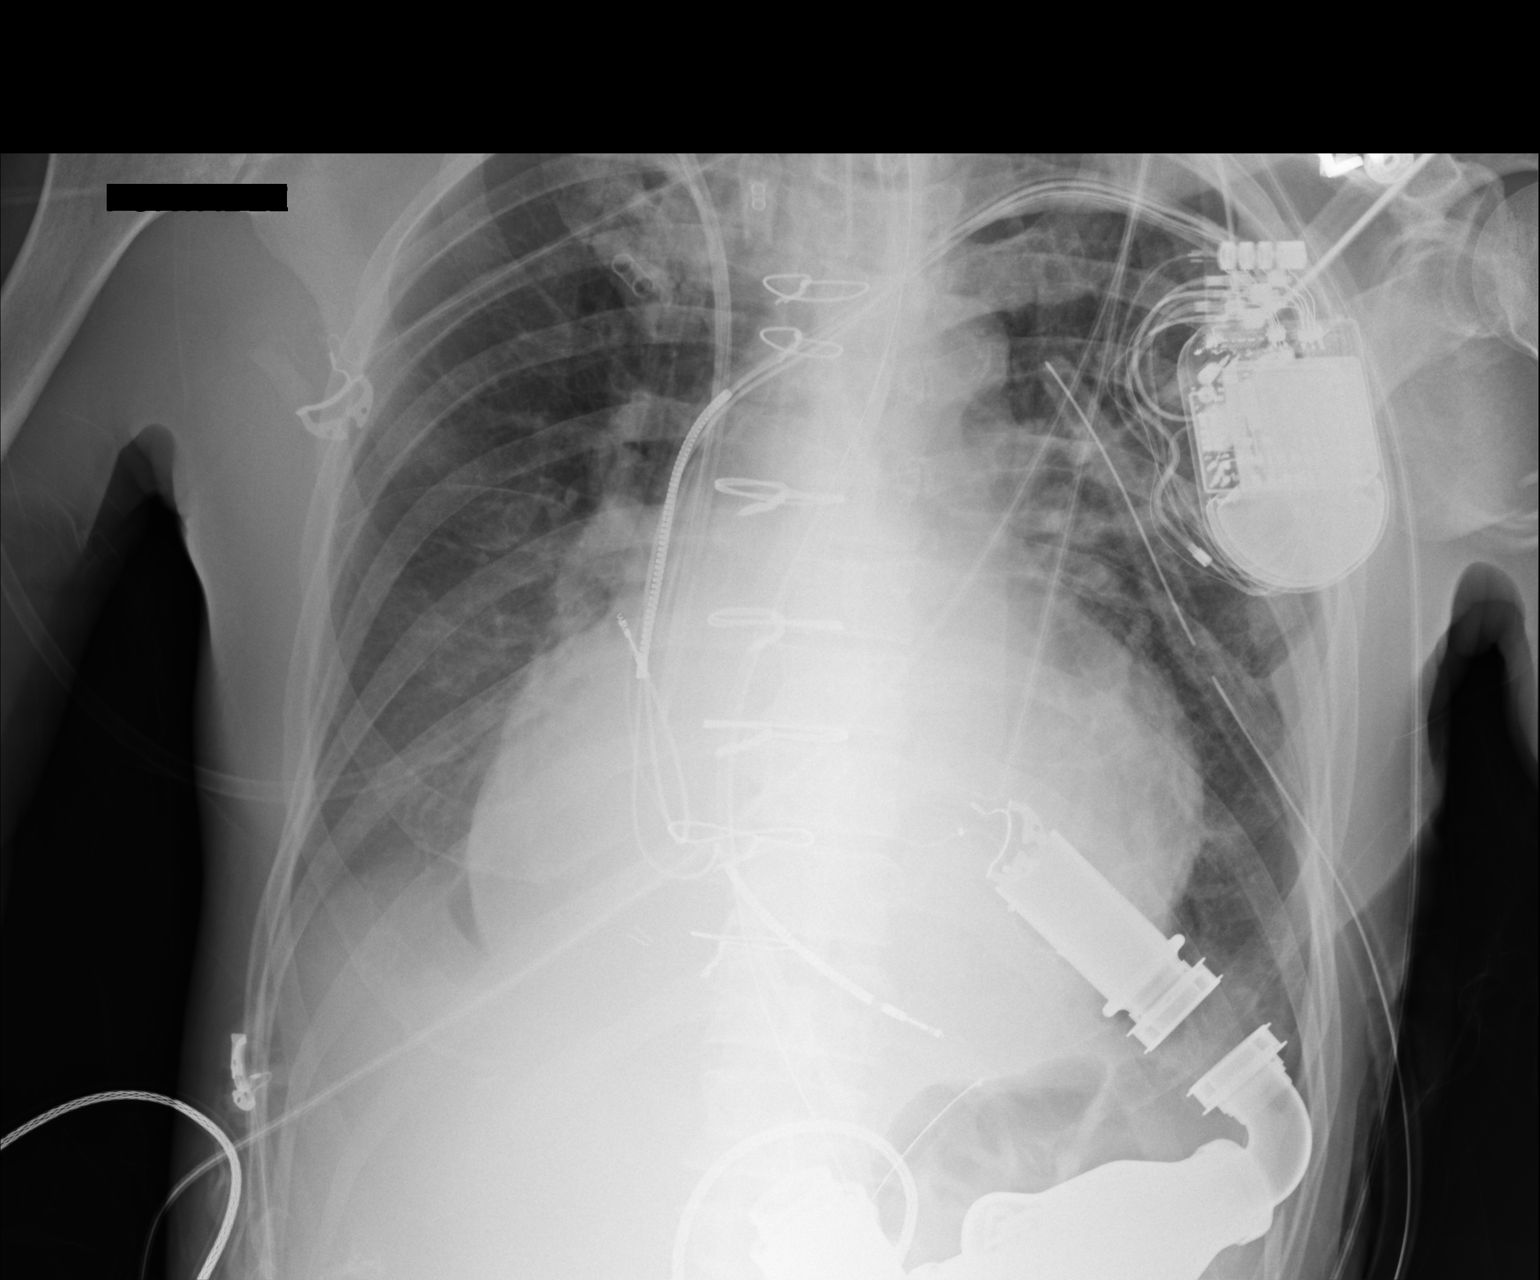

[1 of 1 positions shown; findings below may reference images not displayed]

FINDINGS: Left chest tube, right IJ line, right PICC line in stable position.
Drainage catheters projected over the lower chest and or upper
abdomen in unchanged position. Prior CABG. Left ventricular assist
device in stable position . Cardiac pacer stable position.
Cardiomegaly with pulmonary vascular prominence and bilateral
interstitial prominence with small right pleural effusion. Findings
suggest mild congestive heart failure. Slight interim progression
from prior exam.
IMPRESSION: 1. Lines and tubes including left chest tube in stable position. No
pneumothorax.
2. Prior CABG. Cardiac pacer and left ventricular assist device in
stable position. Cardiomegaly with mild bilateral interstitial
prominence noted. A mild component congestive heart failure cannot
be excluded . Slight interim progression from prior exam .
